# Patient Record
Sex: Male | Born: 1935 | Race: White | Hispanic: No | Marital: Single | Smoking: Former smoker
Health system: Southern US, Community
[De-identification: ages and names within clinical notes are randomized; demographics above are authoritative.]

## PROBLEM LIST (undated history)

## (undated) DIAGNOSIS — N183 Chronic kidney disease, stage 3 unspecified: Secondary | ICD-10-CM

## (undated) DIAGNOSIS — I4891 Unspecified atrial fibrillation: Secondary | ICD-10-CM

## (undated) DIAGNOSIS — H3530 Unspecified macular degeneration: Secondary | ICD-10-CM

## (undated) DIAGNOSIS — E039 Hypothyroidism, unspecified: Secondary | ICD-10-CM

## (undated) DIAGNOSIS — I2510 Atherosclerotic heart disease of native coronary artery without angina pectoris: Secondary | ICD-10-CM

## (undated) DIAGNOSIS — Z9889 Other specified postprocedural states: Secondary | ICD-10-CM

## (undated) DIAGNOSIS — I272 Pulmonary hypertension, unspecified: Secondary | ICD-10-CM

## (undated) DIAGNOSIS — I10 Essential (primary) hypertension: Secondary | ICD-10-CM

## (undated) DIAGNOSIS — I499 Cardiac arrhythmia, unspecified: Secondary | ICD-10-CM

## (undated) DIAGNOSIS — E119 Type 2 diabetes mellitus without complications: Secondary | ICD-10-CM

## (undated) HISTORY — PX: REPLACEMENT TOTAL HIP W/  RESURFACING IMPLANTS: SUR1222

## (undated) HISTORY — PX: COLONOSCOPY: SHX174

## (undated) HISTORY — DX: Atherosclerotic heart disease of native coronary artery without angina pectoris: I25.10

## (undated) HISTORY — PX: CHOLECYSTECTOMY: SHX55

---

## 2007-08-16 DIAGNOSIS — R110 Nausea: Secondary | ICD-10-CM | POA: Insufficient documentation

## 2007-08-20 DIAGNOSIS — I5021 Acute systolic (congestive) heart failure: Secondary | ICD-10-CM | POA: Insufficient documentation

## 2007-08-20 DIAGNOSIS — J189 Pneumonia, unspecified organism: Secondary | ICD-10-CM | POA: Insufficient documentation

## 2007-08-20 DIAGNOSIS — R000 Tachycardia, unspecified: Secondary | ICD-10-CM | POA: Insufficient documentation

## 2007-09-15 DIAGNOSIS — B0233 Zoster keratitis: Secondary | ICD-10-CM | POA: Insufficient documentation

## 2008-02-09 DIAGNOSIS — Z9189 Other specified personal risk factors, not elsewhere classified: Secondary | ICD-10-CM | POA: Insufficient documentation

## 2008-02-09 DIAGNOSIS — S335XXA Sprain of ligaments of lumbar spine, initial encounter: Secondary | ICD-10-CM | POA: Insufficient documentation

## 2008-10-11 ENCOUNTER — Emergency Department (HOSPITAL_COMMUNITY): Admission: EM | Admit: 2008-10-11 | Discharge: 2008-10-11 | Payer: Self-pay | Admitting: Emergency Medicine

## 2011-07-10 ENCOUNTER — Emergency Department (HOSPITAL_COMMUNITY): Payer: Medicare Other

## 2011-07-10 ENCOUNTER — Other Ambulatory Visit: Payer: Self-pay

## 2011-07-10 ENCOUNTER — Emergency Department (HOSPITAL_COMMUNITY)
Admission: EM | Admit: 2011-07-10 | Discharge: 2011-07-10 | Disposition: A | Discharge status: Home / Self Care | Payer: Medicare Other | Attending: Emergency Medicine | Admitting: Emergency Medicine

## 2011-07-10 ENCOUNTER — Encounter (HOSPITAL_COMMUNITY): Payer: Self-pay | Admitting: *Deleted

## 2011-07-10 DIAGNOSIS — R000 Tachycardia, unspecified: Secondary | ICD-10-CM | POA: Insufficient documentation

## 2011-07-10 DIAGNOSIS — E119 Type 2 diabetes mellitus without complications: Secondary | ICD-10-CM | POA: Insufficient documentation

## 2011-07-10 DIAGNOSIS — R002 Palpitations: Secondary | ICD-10-CM | POA: Insufficient documentation

## 2011-07-10 DIAGNOSIS — I4891 Unspecified atrial fibrillation: Secondary | ICD-10-CM | POA: Insufficient documentation

## 2011-07-10 DIAGNOSIS — Z79899 Other long term (current) drug therapy: Secondary | ICD-10-CM | POA: Insufficient documentation

## 2011-07-10 DIAGNOSIS — I10 Essential (primary) hypertension: Secondary | ICD-10-CM | POA: Insufficient documentation

## 2011-07-10 HISTORY — DX: Unspecified atrial fibrillation: I48.91

## 2011-07-10 LAB — POCT I-STAT, CHEM 8
Chloride: 105 mEq/L (ref 96–112)
Creatinine, Ser: 1.4 mg/dL — ABNORMAL HIGH (ref 0.50–1.35)
Glucose, Bld: 109 mg/dL — ABNORMAL HIGH (ref 70–99)
Potassium: 4 mEq/L (ref 3.5–5.1)

## 2011-07-10 MED ORDER — DILTIAZEM HCL 25 MG/5ML IV SOLN
10.0000 mg | Freq: Once | INTRAVENOUS | Status: AC
  Administered 2011-07-10: 10 mg via INTRAVENOUS

## 2011-07-10 MED ORDER — METOPROLOL SUCCINATE ER 25 MG PO TB24: 25.0000 mg | ORAL_TABLET | Freq: Every day | ORAL | Status: DC

## 2011-07-10 MED ORDER — DILTIAZEM HCL 25 MG/5ML IV SOLN
10.0000 mg | Freq: Once | INTRAVENOUS | Status: AC
  Administered 2011-07-10: 10 mg via INTRAVENOUS
  Filled 2011-07-10: qty 5

## 2011-07-10 NOTE — ED Notes (Signed)
Pt has hx of afib and was cardioverted last week into nsr and yesterday felt his heart rate become fast again.  No pain and is on amiodarone

## 2011-07-10 NOTE — ED Provider Notes (Signed)
History     CSN: [id number]  Arrival date & time 07/10/11  1626   First MD Initiated Contact with Patient 07/10/11 1727      Chief Complaint  Patient presents with  . Irregular Heart Beat    (Consider location/radiation/quality/duration/timing/severity/associated sxs/prior treatment) HPI Comments: Pt has a hx of atrial fib, had cardioversion last week.  Today, noticed that heart was beating faster.  No symptoms associated with that..  NO CP or SOB.  No dizziness or syncope.  Just wanted it checked out.  Sees a cardiologist in [place].  Symptoms unchanged for the last several hours.  The history is provided by the patient.    Past Medical History  Diagnosis Date  . Diabetes mellitus   . Hypertension   . Atrial fibrillation     Past Surgical History  Procedure Date  . Replacement total hip w/  resurfacing implants     No family history on file.  History  Substance Use Topics  . Smoking status: Former Games developer  . Smokeless tobacco: Not on file  . Alcohol Use: Yes     occ      Review of Systems  Constitutional: Negative for fever, chills, diaphoresis and fatigue.  HENT: Negative for congestion, rhinorrhea and sneezing.   Eyes: Negative.   Respiratory: Negative for cough, chest tightness and shortness of breath.   Cardiovascular: Positive for palpitations. Negative for chest pain and leg swelling.  Gastrointestinal: Negative for nausea, vomiting, abdominal pain, diarrhea and blood in stool.  Genitourinary: Negative for frequency, hematuria, flank pain and difficulty urinating.  Musculoskeletal: Negative for back pain and arthralgias.  Skin: Negative for rash.  Neurological: Negative for dizziness, speech difficulty, weakness, numbness and headaches.    Allergies  Review of patient's allergies indicates no known allergies.  Home Medications   Current Outpatient Rx  Name Route Sig Dispense Refill  . AMIODARONE HCL 100 MG PO TABS Oral Take 100 mg by mouth daily.     . ASPIRIN 81 MG PO CHEW Oral Chew 81 mg by mouth every other day.    . B COMPLEX-C PO TABS Oral Take 1 tablet by mouth daily.    Marland Kitchen DIPHENHYDRAMINE HCL 25 MG PO TABS Oral Take 25 mg by mouth every 6 (six) hours as needed. For sleep    . DOXAZOSIN MESYLATE 2 MG PO TABS Oral Take 2 mg by mouth at bedtime.    Marland Kitchen LEVOTHYROXINE SODIUM 50 MCG PO TABS Oral Take 50 mcg by mouth daily.    Marland Kitchen LISINOPRIL-HYDROCHLOROTHIAZIDE 20-25 MG PO TABS Oral Take 1 tablet by mouth daily.    Marland Kitchen METFORMIN HCL 500 MG PO TABS Oral Take 1,000 mg by mouth at bedtime.    Marland Kitchen METOPROLOL SUCCINATE ER 25 MG PO TB24 Oral Take 12.5 mg by mouth daily.    Marland Kitchen PRAVASTATIN SODIUM 40 MG PO TABS Oral Take 80 mg by mouth daily.    . WARFARIN SODIUM 3 MG PO TABS Oral Take 3 mg by mouth daily.      BP 125/82  Pulse 126  Temp(Src) 98.3 F (36.8 C) (Oral)  Resp 18  SpO2 95%  Physical Exam  Constitutional: He is oriented to person, place, and time. He appears well-developed and well-nourished.  HENT:  Head: Normocephalic and atraumatic.  Eyes: Pupils are equal, round, and reactive to light.  Neck: Normal range of motion. Neck supple.  Cardiovascular: Normal heart sounds.  Tachycardia present.   Pulmonary/Chest: Effort normal and breath sounds normal. No respiratory  distress. He has no wheezes. He has no rales. He exhibits no tenderness.  Abdominal: Soft. Bowel sounds are normal. There is no tenderness. There is no rebound and no guarding.  Musculoskeletal: Normal range of motion. He exhibits no edema.  Lymphadenopathy:    He has no cervical adenopathy.  Neurological: He is alert and oriented to person, place, and time.  Skin: Skin is warm and dry. No rash noted.  Psychiatric: He has a normal mood and affect.    ED Course  Procedures (including critical care time)  Results for orders placed during the hospital encounter of 07/10/11  POCT I-STAT, CHEM 8      Component Value Range   Sodium 142  135 - 145 (mEq/L)   Potassium 4.0   3.5 - 5.1 (mEq/L)   Chloride 105  96 - 112 (mEq/L)   BUN 23  6 - 23 (mg/dL)   Creatinine, Ser 1.61 (*) 0.50 - 1.35 (mg/dL)   Glucose, Bld 096 (*) 70 - 99 (mg/dL)   Calcium, Ion 0.45  1.12 - 1.32 (mmol/L)   TCO2 26  0 - 100 (mmol/L)   Hemoglobin 13.3  13.0 - 17.0 (g/dL)   HCT 40.9  81.1 - 91.4 (%)  POCT I-STAT TROPONIN I      Component Value Range   Troponin i, poc 0.00  0.00 - 0.08 (ng/mL)   Comment 3            Dg Chest 2 View  07/10/2011  *RADIOLOGY REPORT*  Clinical Data: 78 year old male with tachycardia.  CHEST - 2 VIEW  Comparison: 03/23/2009 and 02/05/2011  Findings: The cardiomediastinal silhouette is unremarkable. There is no evidence of focal airspace disease, pulmonary edema, suspicious pulmonary nodule/mass, pleural effusion, or pneumothorax. No acute bony abnormalities are identified. A calcified granuloma in the mid left lung is again noted.  IMPRESSION: No evidence of active cardiopulmonary disease.  Original Report Authenticated By: Rosendo Gros, M.D.    Date: 07/10/2011  Rate: 132  Rhythm: atrial fibrillation  QRS Axis: normal, rightward axia  Intervals: normal  ST/T Wave abnormalities: nonspecific T wave changes  Conduction Disutrbances:none  Narrative Interpretation:   Old EKG Reviewed: none available      1. Atrial fibrillation with rapid ventricular response       MDM  Pt given cardizem for rate control.  Has no symptoms.  Troponin neg. Spoke with Dr. Louanne Belton who is pt's on call who agrees that pt can go home.  Recommends increasing his metoprolol to 25mg  per day.  HR has come down to low 100s        Rolan Bucco, MD 07/10/11 2133

## 2011-07-10 NOTE — ED Notes (Signed)
Discharge inst given voiced understanding  Instructed to increase med to 1 pill each day.

## 2011-07-10 NOTE — Discharge Instructions (Signed)
Atrial Fibrillation Your caregiver has diagnosed you with atrial fibrillation (AFib). The heart normally beats very regularly; AFib is a type of irregular heartbeat. The heart rate may be faster or slower than normal. This can prevent your heart from pumping as well as it should. AFib can be constant (chronic) or intermittent (paroxysmal). CAUSES  Atrial fibrillation may be caused by:  Heart disease, including heart attack, coronary artery disease, heart failure, diseases of the heart valves, and others.   Blood clot in the lungs (pulmonary embolism).   Pneumonia or other infections.   Chronic lung disease.   Thyroid disease.   Toxins. These include alcohol, some medications (such as decongestant medications or diet pills), and caffeine.  In some people, no cause for AFib can be found. This is referred to as Lone Atrial Fibrillation. SYMPTOMS   Palpitations or a fluttering in your chest.   A vague sense of chest discomfort.   Shortness of breath.   Sudden onset of lightheadedness or weakness.  Sometimes, the first sign of AFib can be a complication of the condition. This could be a stroke or heart failure. DIAGNOSIS  Your description of your condition may make your caregiver suspicious of atrial fibrillation. Your caregiver will examine your pulse to determine if fibrillation is present. An EKG (electrocardiogram) will confirm the diagnosis. Further testing may help determine what caused you to have atrial fibrillation. This may include chest x-ray, echocardiogram, blood tests, or CT scans. PREVENTION  If you have previously had atrial fibrillation, your caregiver may advise you to avoid substances known to cause the condition (such as stimulant medications, and possibly caffeine or alcohol). You may be advised to use medications to prevent recurrence. Proper treatment of any underlying condition is important to help prevent recurrence. PROGNOSIS  Atrial fibrillation does tend to  become a chronic condition over time. It can cause significant complications (see below). Atrial fibrillation is not usually immediately life-threatening, but it can shorten your life expectancy. This seems to be worse in women. If you have lone atrial fibrillation and are under 60 years old, the risk of complications is very low, and life expectancy is not shortened. RISKS AND COMPLICATIONS  Complications of atrial fibrillation can include stroke, chest pain, and heart failure. Your caregiver will recommend treatments for the atrial fibrillation, as well as for any underlying conditions, to help minimize risk of complications. TREATMENT  Treatment for AFib is divided into several categories:  Treatment of any underlying condition.   Converting you out of AFib into a regular (sinus) rhythm.   Controlling rapid heart rate.   Prevention of blood clots and stroke.  Medications and procedures are available to convert your atrial fibrillation to sinus rhythm. However, recent studies have shown that this may not offer you any advantage, and cardiac experts are continuing research and debate on this topic. More important is controlling your rapid heartbeat. The rapid heartbeat causes more symptoms, and places strain on your heart. Your caregiver will advise you on the use of medications that can control your heart rate. Atrial fibrillation is a strong stroke risk. You can lessen this risk by taking blood thinning medications such as Coumadin (warfarin), or sometimes aspirin. These medications need close monitoring by your caregiver. Over-medication can cause bleeding. Too little medication may not protect against stroke. HOME CARE INSTRUCTIONS   If your caregiver prescribed medicine to make your heartbeat more normally, take as directed.   If blood thinners were prescribed by your caregiver, take EXACTLY as directed.     Perform blood tests EXACTLY as directed.   Quit smoking. Smoking increases your  cardiac and lung (pulmonary) risks.   DO NOT drink alcohol.   DO NOT drink caffeinated drinks (e.g. coffee, soda, chocolate, and leaf teas). You may drink decaffeinated coffee, soda or tea.   If you are overweight, you should choose a reduced calorie diet to lose weight. Please see a registered dietitian if you need more information about healthy weight loss. DO NOT USE DIET PILLS as they may aggravate heart problems.   If you have other heart problems that are causing AFib, you may need to eat a low salt, fat, and cholesterol diet. Your caregiver will tell you if this is necessary.   Exercise every day to improve your physical fitness. Stay active unless advised otherwise.   If your caregiver has given you a follow-up appointment, it is very important to keep that appointment. Not keeping the appointment could result in heart failure or stroke. If there is any problem keeping the appointment, you must call back to this facility for assistance.  SEEK MEDICAL CARE IF:  You notice a change in the rate, rhythm or strength of your heartbeat.   You develop an infection or any other change in your overall health status.  SEEK IMMEDIATE MEDICAL CARE IF:   You develop chest pain, abdominal pain, sweating, weakness or feel sick to your stomach (nausea).   You develop shortness of breath.   You develop swollen feet and ankles.   You develop dizziness, numbness, or weakness of your face or limbs, or any change in vision or speech.  MAKE SURE YOU:   Understand these instructions.   Will watch your condition.   Will get help right away if you are not doing well or get worse.  Document Released: 04/01/2005 Document Revised: 03/21/2011 Document Reviewed: 11/04/2007 [employer] Patient Information 2012 [employer].Atrial Fibrillation Your caregiver has diagnosed you with atrial fibrillation (AFib). The heart normally beats very regularly; AFib is a type of irregular heartbeat. The heart rate may  be faster or slower than normal. This can prevent your heart from pumping as well as it should. AFib can be constant (chronic) or intermittent (paroxysmal). CAUSES  Atrial fibrillation may be caused by:  Heart disease, including heart attack, coronary artery disease, heart failure, diseases of the heart valves, and others.   Blood clot in the lungs (pulmonary embolism).   Pneumonia or other infections.   Chronic lung disease.   Thyroid disease.   Toxins. These include alcohol, some medications (such as decongestant medications or diet pills), and caffeine.  In some people, no cause for AFib can be found. This is referred to as Lone Atrial Fibrillation. SYMPTOMS   Palpitations or a fluttering in your chest.   A vague sense of chest discomfort.   Shortness of breath.   Sudden onset of lightheadedness or weakness.  Sometimes, the first sign of AFib can be a complication of the condition. This could be a stroke or heart failure. DIAGNOSIS  Your description of your condition may make your caregiver suspicious of atrial fibrillation. Your caregiver will examine your pulse to determine if fibrillation is present. An EKG (electrocardiogram) will confirm the diagnosis. Further testing may help determine what caused you to have atrial fibrillation. This may include chest x-ray, echocardiogram, blood tests, or CT scans. PREVENTION  If you have previously had atrial fibrillation, your caregiver may advise you to avoid substances known to cause the condition (such as stimulant medications, and possibly   caffeine or alcohol). You may be advised to use medications to prevent recurrence. Proper treatment of any underlying condition is important to help prevent recurrence. PROGNOSIS  Atrial fibrillation does tend to become a chronic condition over time. It can cause significant complications (see below). Atrial fibrillation is not usually immediately life-threatening, but it can shorten your life  expectancy. This seems to be worse in women. If you have lone atrial fibrillation and are under 60 years old, the risk of complications is very low, and life expectancy is not shortened. RISKS AND COMPLICATIONS  Complications of atrial fibrillation can include stroke, chest pain, and heart failure. Your caregiver will recommend treatments for the atrial fibrillation, as well as for any underlying conditions, to help minimize risk of complications. TREATMENT  Treatment for AFib is divided into several categories:  Treatment of any underlying condition.   Converting you out of AFib into a regular (sinus) rhythm.   Controlling rapid heart rate.   Prevention of blood clots and stroke.  Medications and procedures are available to convert your atrial fibrillation to sinus rhythm. However, recent studies have shown that this may not offer you any advantage, and cardiac experts are continuing research and debate on this topic. More important is controlling your rapid heartbeat. The rapid heartbeat causes more symptoms, and places strain on your heart. Your caregiver will advise you on the use of medications that can control your heart rate. Atrial fibrillation is a strong stroke risk. You can lessen this risk by taking blood thinning medications such as Coumadin (warfarin), or sometimes aspirin. These medications need close monitoring by your caregiver. Over-medication can cause bleeding. Too little medication may not protect against stroke. HOME CARE INSTRUCTIONS   If your caregiver prescribed medicine to make your heartbeat more normally, take as directed.   If blood thinners were prescribed by your caregiver, take EXACTLY as directed.   Perform blood tests EXACTLY as directed.   Quit smoking. Smoking increases your cardiac and lung (pulmonary) risks.   DO NOT drink alcohol.   DO NOT drink caffeinated drinks (e.g. coffee, soda, chocolate, and leaf teas). You may drink decaffeinated coffee, soda  or tea.   If you are overweight, you should choose a reduced calorie diet to lose weight. Please see a registered dietitian if you need more information about healthy weight loss. DO NOT USE DIET PILLS as they may aggravate heart problems.   If you have other heart problems that are causing AFib, you may need to eat a low salt, fat, and cholesterol diet. Your caregiver will tell you if this is necessary.   Exercise every day to improve your physical fitness. Stay active unless advised otherwise.   If your caregiver has given you a follow-up appointment, it is very important to keep that appointment. Not keeping the appointment could result in heart failure or stroke. If there is any problem keeping the appointment, you must call back to this facility for assistance.  SEEK MEDICAL CARE IF:  You notice a change in the rate, rhythm or strength of your heartbeat.   You develop an infection or any other change in your overall health status.  SEEK IMMEDIATE MEDICAL CARE IF:   You develop chest pain, abdominal pain, sweating, weakness or feel sick to your stomach (nausea).   You develop shortness of breath.   You develop swollen feet and ankles.   You develop dizziness, numbness, or weakness of your face or limbs, or any change in vision or speech.    MAKE SURE YOU:   Understand these instructions.   Will watch your condition.   Will get help right away if you are not doing well or get worse.  Document Released: 04/01/2005 Document Revised: 03/21/2011 Document Reviewed: 11/04/2007 [employer] Patient Information 2012 [employer]. 

## 2013-12-01 ENCOUNTER — Inpatient Hospital Stay (HOSPITAL_COMMUNITY)
Admission: EM | Admit: 2013-12-01 | Discharge: 2013-12-11 | DRG: 438 | Disposition: A | Discharge status: Home Health | Payer: Medicare Other | Attending: Internal Medicine | Admitting: Internal Medicine

## 2013-12-01 ENCOUNTER — Emergency Department (HOSPITAL_COMMUNITY): Payer: Medicare Other

## 2013-12-01 ENCOUNTER — Encounter (HOSPITAL_COMMUNITY): Payer: Self-pay | Admitting: Internal Medicine

## 2013-12-01 ENCOUNTER — Observation Stay (HOSPITAL_COMMUNITY): Payer: Medicare Other

## 2013-12-01 DIAGNOSIS — R062 Wheezing: Secondary | ICD-10-CM

## 2013-12-01 DIAGNOSIS — Z79899 Other long term (current) drug therapy: Secondary | ICD-10-CM | POA: Diagnosis not present

## 2013-12-01 DIAGNOSIS — K8050 Calculus of bile duct without cholangitis or cholecystitis without obstruction: Secondary | ICD-10-CM | POA: Diagnosis present

## 2013-12-01 DIAGNOSIS — R799 Abnormal finding of blood chemistry, unspecified: Secondary | ICD-10-CM

## 2013-12-01 DIAGNOSIS — I5031 Acute diastolic (congestive) heart failure: Secondary | ICD-10-CM

## 2013-12-01 DIAGNOSIS — N183 Chronic kidney disease, stage 3 unspecified: Secondary | ICD-10-CM | POA: Diagnosis present

## 2013-12-01 DIAGNOSIS — Z6829 Body mass index (BMI) 29.0-29.9, adult: Secondary | ICD-10-CM

## 2013-12-01 DIAGNOSIS — K859 Acute pancreatitis without necrosis or infection, unspecified: Secondary | ICD-10-CM | POA: Diagnosis not present

## 2013-12-01 DIAGNOSIS — K5900 Constipation, unspecified: Secondary | ICD-10-CM | POA: Diagnosis present

## 2013-12-01 DIAGNOSIS — Z87891 Personal history of nicotine dependence: Secondary | ICD-10-CM

## 2013-12-01 DIAGNOSIS — I5030 Unspecified diastolic (congestive) heart failure: Secondary | ICD-10-CM | POA: Diagnosis present

## 2013-12-01 DIAGNOSIS — I10 Essential (primary) hypertension: Secondary | ICD-10-CM | POA: Diagnosis present

## 2013-12-01 DIAGNOSIS — J440 Chronic obstructive pulmonary disease with acute lower respiratory infection: Secondary | ICD-10-CM | POA: Diagnosis present

## 2013-12-01 DIAGNOSIS — Z7982 Long term (current) use of aspirin: Secondary | ICD-10-CM | POA: Diagnosis not present

## 2013-12-01 DIAGNOSIS — I129 Hypertensive chronic kidney disease with stage 1 through stage 4 chronic kidney disease, or unspecified chronic kidney disease: Secondary | ICD-10-CM | POA: Diagnosis present

## 2013-12-01 DIAGNOSIS — K831 Obstruction of bile duct: Secondary | ICD-10-CM | POA: Diagnosis present

## 2013-12-01 DIAGNOSIS — D65 Disseminated intravascular coagulation [defibrination syndrome]: Secondary | ICD-10-CM | POA: Diagnosis present

## 2013-12-01 DIAGNOSIS — I4891 Unspecified atrial fibrillation: Secondary | ICD-10-CM | POA: Diagnosis present

## 2013-12-01 DIAGNOSIS — Z7901 Long term (current) use of anticoagulants: Secondary | ICD-10-CM | POA: Diagnosis not present

## 2013-12-01 DIAGNOSIS — E038 Other specified hypothyroidism: Secondary | ICD-10-CM

## 2013-12-01 DIAGNOSIS — R7401 Elevation of levels of liver transaminase levels: Secondary | ICD-10-CM | POA: Diagnosis present

## 2013-12-01 DIAGNOSIS — E039 Hypothyroidism, unspecified: Secondary | ICD-10-CM | POA: Diagnosis present

## 2013-12-01 DIAGNOSIS — I2789 Other specified pulmonary heart diseases: Secondary | ICD-10-CM | POA: Diagnosis present

## 2013-12-01 DIAGNOSIS — E1121 Type 2 diabetes mellitus with diabetic nephropathy: Secondary | ICD-10-CM | POA: Diagnosis present

## 2013-12-01 DIAGNOSIS — E1129 Type 2 diabetes mellitus with other diabetic kidney complication: Secondary | ICD-10-CM | POA: Diagnosis present

## 2013-12-01 DIAGNOSIS — I272 Pulmonary hypertension, unspecified: Secondary | ICD-10-CM | POA: Diagnosis present

## 2013-12-01 DIAGNOSIS — N058 Unspecified nephritic syndrome with other morphologic changes: Secondary | ICD-10-CM | POA: Diagnosis present

## 2013-12-01 DIAGNOSIS — K8071 Calculus of gallbladder and bile duct without cholecystitis with obstruction: Secondary | ICD-10-CM | POA: Diagnosis present

## 2013-12-01 DIAGNOSIS — E785 Hyperlipidemia, unspecified: Secondary | ICD-10-CM | POA: Diagnosis present

## 2013-12-01 DIAGNOSIS — T45515A Adverse effect of anticoagulants, initial encounter: Secondary | ICD-10-CM | POA: Diagnosis present

## 2013-12-01 DIAGNOSIS — K8020 Calculus of gallbladder without cholecystitis without obstruction: Secondary | ICD-10-CM | POA: Diagnosis present

## 2013-12-01 DIAGNOSIS — K851 Biliary acute pancreatitis without necrosis or infection: Secondary | ICD-10-CM

## 2013-12-01 DIAGNOSIS — R109 Unspecified abdominal pain: Secondary | ICD-10-CM | POA: Diagnosis present

## 2013-12-01 DIAGNOSIS — I509 Heart failure, unspecified: Secondary | ICD-10-CM | POA: Diagnosis present

## 2013-12-01 DIAGNOSIS — D696 Thrombocytopenia, unspecified: Secondary | ICD-10-CM | POA: Diagnosis not present

## 2013-12-01 DIAGNOSIS — J209 Acute bronchitis, unspecified: Secondary | ICD-10-CM | POA: Diagnosis present

## 2013-12-01 DIAGNOSIS — R740 Nonspecific elevation of levels of transaminase and lactic acid dehydrogenase [LDH]: Secondary | ICD-10-CM

## 2013-12-01 HISTORY — DX: Chronic kidney disease, stage 3 (moderate): N18.3

## 2013-12-01 HISTORY — DX: Hypothyroidism, unspecified: E03.9

## 2013-12-01 HISTORY — DX: Chronic kidney disease, stage 3 unspecified: N18.30

## 2013-12-01 HISTORY — DX: Pulmonary hypertension, unspecified: I27.20

## 2013-12-01 HISTORY — DX: Other specified postprocedural states: Z98.890

## 2013-12-01 HISTORY — DX: Type 2 diabetes mellitus without complications: E11.9

## 2013-12-01 HISTORY — DX: Essential (primary) hypertension: I10

## 2013-12-01 LAB — GLUCOSE, CAPILLARY
GLUCOSE-CAPILLARY: 165 mg/dL — AB (ref 70–99)
Glucose-Capillary: 123 mg/dL — ABNORMAL HIGH (ref 70–99)

## 2013-12-01 LAB — CBC
HCT: 38.4 % — ABNORMAL LOW (ref 39.0–52.0)
Hemoglobin: 13.4 g/dL (ref 13.0–17.0)
MCH: 31.2 pg (ref 26.0–34.0)
MCHC: 34.9 g/dL (ref 30.0–36.0)
MCV: 89.3 fL (ref 78.0–100.0)
PLATELETS: 153 10*3/uL (ref 150–400)
RBC: 4.3 MIL/uL (ref 4.22–5.81)
RDW: 12.9 % (ref 11.5–15.5)
WBC: 12.9 10*3/uL — ABNORMAL HIGH (ref 4.0–10.5)

## 2013-12-01 LAB — URINALYSIS, ROUTINE W REFLEX MICROSCOPIC
Bilirubin Urine: NEGATIVE
Glucose, UA: NEGATIVE mg/dL
Ketones, ur: NEGATIVE mg/dL
Leukocytes, UA: NEGATIVE
Nitrite: NEGATIVE
PH: 5 (ref 5.0–8.0)
Protein, ur: NEGATIVE mg/dL
SPECIFIC GRAVITY, URINE: 1.01 (ref 1.005–1.030)
UROBILINOGEN UA: 0.2 mg/dL (ref 0.0–1.0)

## 2013-12-01 LAB — CBC WITH DIFFERENTIAL/PLATELET
BASOS ABS: 0 10*3/uL (ref 0.0–0.1)
BASOS PCT: 0 % (ref 0–1)
EOS ABS: 0.1 10*3/uL (ref 0.0–0.7)
Eosinophils Relative: 1 % (ref 0–5)
HCT: 41.7 % (ref 39.0–52.0)
HEMOGLOBIN: 14.5 g/dL (ref 13.0–17.0)
Lymphocytes Relative: 18 % (ref 12–46)
Lymphs Abs: 2.5 10*3/uL (ref 0.7–4.0)
MCH: 30.7 pg (ref 26.0–34.0)
MCHC: 34.8 g/dL (ref 30.0–36.0)
MCV: 88.3 fL (ref 78.0–100.0)
MONOS PCT: 7 % (ref 3–12)
Monocytes Absolute: 0.9 10*3/uL (ref 0.1–1.0)
NEUTROS ABS: 10.3 10*3/uL — AB (ref 1.7–7.7)
NEUTROS PCT: 74 % (ref 43–77)
Platelets: 204 10*3/uL (ref 150–400)
RBC: 4.72 MIL/uL (ref 4.22–5.81)
RDW: 12.5 % (ref 11.5–15.5)
WBC: 13.8 10*3/uL — ABNORMAL HIGH (ref 4.0–10.5)

## 2013-12-01 LAB — COMPREHENSIVE METABOLIC PANEL
ALBUMIN: 4.3 g/dL (ref 3.5–5.2)
ALBUMIN: 3.7 g/dL (ref 3.5–5.2)
ALK PHOS: 101 U/L (ref 39–117)
ALT: 45 U/L (ref 0–53)
ALT: 365 U/L — AB (ref 0–53)
ANION GAP: 14 (ref 5–15)
AST: 83 U/L — AB (ref 0–37)
AST: 592 U/L — ABNORMAL HIGH (ref 0–37)
Alkaline Phosphatase: 134 U/L — ABNORMAL HIGH (ref 39–117)
Anion gap: 10 (ref 5–15)
BUN: 23 mg/dL (ref 6–23)
BUN: 23 mg/dL (ref 6–23)
CALCIUM: 8.6 mg/dL (ref 8.4–10.5)
CO2: 29 mEq/L (ref 19–32)
CO2: 28 mEq/L (ref 19–32)
CREATININE: 1.51 mg/dL — AB (ref 0.50–1.35)
Calcium: 9.4 mg/dL (ref 8.4–10.5)
Chloride: 98 mEq/L (ref 96–112)
Chloride: 101 mEq/L (ref 96–112)
Creatinine, Ser: 1.36 mg/dL — ABNORMAL HIGH (ref 0.50–1.35)
GFR calc Af Amer: 49 mL/min — ABNORMAL LOW (ref >90)
GFR calc non Af Amer: 42 mL/min — ABNORMAL LOW (ref >90)
GFR calc non Af Amer: 48 mL/min — ABNORMAL LOW (ref >90)
GFR, EST AFRICAN AMERICAN: 55 mL/min — AB (ref >90)
Glucose, Bld: 203 mg/dL — ABNORMAL HIGH (ref 70–99)
Glucose, Bld: 168 mg/dL — ABNORMAL HIGH (ref 70–99)
POTASSIUM: 3.6 meq/L — AB (ref 3.7–5.3)
Potassium: 3.8 mEq/L (ref 3.7–5.3)
Sodium: 141 mEq/L (ref 137–147)
Sodium: 139 mEq/L (ref 137–147)
TOTAL PROTEIN: 7.5 g/dL (ref 6.0–8.3)
TOTAL PROTEIN: 6.4 g/dL (ref 6.0–8.3)
Total Bilirubin: 1.3 mg/dL — ABNORMAL HIGH (ref 0.3–1.2)
Total Bilirubin: 2.5 mg/dL — ABNORMAL HIGH (ref 0.3–1.2)

## 2013-12-01 LAB — LIPASE, BLOOD
LIPASE: 939 U/L — AB (ref 11–59)
LIPASE: 582 U/L — AB (ref 11–59)

## 2013-12-01 LAB — PROTIME-INR
INR: 2.61 — ABNORMAL HIGH (ref 0.00–1.49)
PROTHROMBIN TIME: 27.9 s — AB (ref 11.6–15.2)

## 2013-12-01 LAB — I-STAT CG4 LACTIC ACID, ED: LACTIC ACID, VENOUS: 2.47 mmol/L — AB (ref 0.5–2.2)

## 2013-12-01 LAB — I-STAT TROPONIN, ED: TROPONIN I, POC: 0 ng/mL (ref 0.00–0.08)

## 2013-12-01 LAB — URINE MICROSCOPIC-ADD ON

## 2013-12-01 MED ORDER — GADOBENATE DIMEGLUMINE 529 MG/ML IV SOLN
20.0000 mL | Freq: Once | INTRAVENOUS | Status: AC | PRN
  Administered 2013-12-01: 20 mL via INTRAVENOUS

## 2013-12-01 MED ORDER — MORPHINE SULFATE 2 MG/ML IJ SOLN: 2.0000 mg | INTRAMUSCULAR | Status: DC | PRN

## 2013-12-01 MED ORDER — SODIUM CHLORIDE 0.9 % IV SOLN
Freq: Once | INTRAVENOUS | Status: AC
  Administered 2013-12-01: 1000 mL via INTRAVENOUS

## 2013-12-01 MED ORDER — SODIUM CHLORIDE 0.9 % IJ SOLN
3.0000 mL | Freq: Two times a day (BID) | INTRAMUSCULAR | Status: DC
  Administered 2013-12-01 – 2013-12-10 (×17): 3 mL via INTRAVENOUS

## 2013-12-01 MED ORDER — ONDANSETRON HCL 4 MG PO TABS: 4.0000 mg | ORAL_TABLET | Freq: Four times a day (QID) | ORAL | Status: DC | PRN

## 2013-12-01 MED ORDER — ONDANSETRON HCL 4 MG/2ML IJ SOLN: 4.0000 mg | Freq: Four times a day (QID) | INTRAMUSCULAR | Status: DC | PRN

## 2013-12-01 MED ORDER — ONDANSETRON HCL 4 MG/2ML IJ SOLN
4.0000 mg | Freq: Once | INTRAMUSCULAR | Status: AC
  Administered 2013-12-01: 4 mg via INTRAVENOUS
  Filled 2013-12-01: qty 2

## 2013-12-01 MED ORDER — MORPHINE SULFATE 4 MG/ML IJ SOLN
4.0000 mg | Freq: Once | INTRAMUSCULAR | Status: AC
  Administered 2013-12-01: 4 mg via INTRAVENOUS
  Filled 2013-12-01: qty 1

## 2013-12-01 MED ORDER — LISINOPRIL 10 MG PO TABS
20.0000 mg | ORAL_TABLET | Freq: Every day | ORAL | Status: DC
  Administered 2013-12-01 – 2013-12-11 (×11): 20 mg via ORAL
  Filled 2013-12-01 (×11): qty 2

## 2013-12-01 MED ORDER — ALUM & MAG HYDROXIDE-SIMETH 200-200-20 MG/5ML PO SUSP: 30.0000 mL | Freq: Four times a day (QID) | ORAL | Status: DC | PRN

## 2013-12-01 MED ORDER — LEVOTHYROXINE SODIUM 25 MCG PO TABS
50.0000 ug | ORAL_TABLET | Freq: Every day | ORAL | Status: DC
  Administered 2013-12-01 – 2013-12-11 (×11): 50 ug via ORAL
  Filled 2013-12-01 (×2): qty 2
  Filled 2013-12-01: qty 1
  Filled 2013-12-01 (×6): qty 2
  Filled 2013-12-01 (×2): qty 1

## 2013-12-01 MED ORDER — SODIUM CHLORIDE 0.9 % IJ SOLN
INTRAMUSCULAR | Status: AC
  Filled 2013-12-01: qty 300

## 2013-12-01 MED ORDER — DILTIAZEM HCL ER COATED BEADS 240 MG PO CP24
240.0000 mg | ORAL_CAPSULE | Freq: Every day | ORAL | Status: DC
  Administered 2013-12-01 – 2013-12-04 (×4): 240 mg via ORAL
  Filled 2013-12-01 (×5): qty 1

## 2013-12-01 MED ORDER — SODIUM CHLORIDE 0.9 % IV BOLUS (SEPSIS): 500.0000 mL | Freq: Once | INTRAVENOUS | Status: DC

## 2013-12-01 MED ORDER — METOPROLOL SUCCINATE ER 25 MG PO TB24
25.0000 mg | ORAL_TABLET | Freq: Every day | ORAL | Status: DC
  Administered 2013-12-01 – 2013-12-05 (×5): 25 mg via ORAL
  Filled 2013-12-01 (×5): qty 1

## 2013-12-01 MED ORDER — LISINOPRIL-HYDROCHLOROTHIAZIDE 20-25 MG PO TABS: 1.0000 | ORAL_TABLET | Freq: Every day | ORAL | Status: DC

## 2013-12-01 MED ORDER — SODIUM CHLORIDE 0.9 % IV SOLN
INTRAVENOUS | Status: AC
  Filled 2013-12-01: qty 300

## 2013-12-01 MED ORDER — HYDROCHLOROTHIAZIDE 25 MG PO TABS
25.0000 mg | ORAL_TABLET | Freq: Every day | ORAL | Status: DC
  Administered 2013-12-01: 25 mg via ORAL
  Filled 2013-12-01: qty 1

## 2013-12-01 MED ORDER — SIMVASTATIN 20 MG PO TABS: 40.0000 mg | ORAL_TABLET | Freq: Every day | ORAL | Status: DC

## 2013-12-01 MED ORDER — METFORMIN HCL 500 MG PO TABS: 1000.0000 mg | ORAL_TABLET | Freq: Every day | ORAL | Status: DC

## 2013-12-01 MED ORDER — ACETAMINOPHEN 325 MG PO TABS
650.0000 mg | ORAL_TABLET | Freq: Four times a day (QID) | ORAL | Status: DC | PRN
  Administered 2013-12-03: 650 mg via ORAL
  Filled 2013-12-01: qty 2

## 2013-12-01 MED ORDER — ATORVASTATIN CALCIUM 20 MG PO TABS
20.0000 mg | ORAL_TABLET | Freq: Every day | ORAL | Status: DC
  Administered 2013-12-01 – 2013-12-10 (×10): 20 mg via ORAL
  Filled 2013-12-01 (×10): qty 1

## 2013-12-01 MED ORDER — PANTOPRAZOLE SODIUM 40 MG PO TBEC
40.0000 mg | DELAYED_RELEASE_TABLET | Freq: Every day | ORAL | Status: DC
  Administered 2013-12-01 – 2013-12-03 (×3): 40 mg via ORAL
  Filled 2013-12-01 (×3): qty 1

## 2013-12-01 MED ORDER — WARFARIN SODIUM 2 MG PO TABS: 3.0000 mg | ORAL_TABLET | Freq: Every day | ORAL | Status: DC

## 2013-12-01 MED ORDER — IOHEXOL 300 MG/ML  SOLN
50.0000 mL | Freq: Once | INTRAMUSCULAR | Status: AC | PRN
  Administered 2013-12-01: 50 mL via INTRAVENOUS

## 2013-12-01 MED ORDER — WARFARIN SODIUM 3 MG PO TABS: 3.0000 mg | ORAL_TABLET | Freq: Every day | ORAL | Status: DC

## 2013-12-01 MED ORDER — IOHEXOL 300 MG/ML  SOLN
80.0000 mL | Freq: Once | INTRAMUSCULAR | Status: AC | PRN
  Administered 2013-12-01: 80 mL via INTRAVENOUS

## 2013-12-01 MED ORDER — ASPIRIN 81 MG PO CHEW
81.0000 mg | CHEWABLE_TABLET | ORAL | Status: DC
  Administered 2013-12-01: 81 mg via ORAL
  Filled 2013-12-01: qty 1

## 2013-12-01 MED ORDER — ACETAMINOPHEN 650 MG RE SUPP: 650.0000 mg | Freq: Four times a day (QID) | RECTAL | Status: DC | PRN

## 2013-12-01 MED ORDER — POLYETHYLENE GLYCOL 3350 17 G PO PACK
17.0000 g | PACK | Freq: Every day | ORAL | Status: DC | PRN
Start: 2013-12-01 — End: 2013-12-11
  Administered 2013-12-01: 17 g via ORAL
  Filled 2013-12-01: qty 1

## 2013-12-01 MED ORDER — DOXAZOSIN MESYLATE 2 MG PO TABS
2.0000 mg | ORAL_TABLET | Freq: Every day | ORAL | Status: DC
  Administered 2013-12-02 – 2013-12-10 (×9): 2 mg via ORAL
  Filled 2013-12-01 (×11): qty 1

## 2013-12-01 MED ORDER — WARFARIN - PHYSICIAN DOSING INPATIENT: Freq: Every day | Status: DC

## 2013-12-01 MED ORDER — INSULIN ASPART 100 UNIT/ML ~~LOC~~ SOLN
0.0000 [IU] | SUBCUTANEOUS | Status: DC
  Administered 2013-12-01: 2 [IU] via SUBCUTANEOUS
  Administered 2013-12-01 – 2013-12-02 (×2): 1 [IU] via SUBCUTANEOUS
  Administered 2013-12-02: 3 [IU] via SUBCUTANEOUS
  Administered 2013-12-02 (×2): 1 [IU] via SUBCUTANEOUS

## 2013-12-01 MED ORDER — DOCUSATE SODIUM 100 MG PO CAPS: 100.0000 mg | ORAL_CAPSULE | Freq: Every day | ORAL | Status: DC | PRN

## 2013-12-01 MED ORDER — SODIUM CHLORIDE 0.9 % IV SOLN
INTRAVENOUS | Status: DC
  Administered 2013-12-01 – 2013-12-02 (×3): via INTRAVENOUS

## 2013-12-01 NOTE — Consult Note (Addendum)
REVIEWED. MRCP-NO CBD STONE. FULL LIQUID DIET. RE-START COUMADIN AUG 20. SURGERY CONSULT AUG 20 FOR CHOLECYSTECTOMY.

## 2013-12-01 NOTE — Progress Notes (Signed)
Inpatient Diabetes Program Recommendations  AACE/ADA: New Consensus Statement on Inpatient Glycemic Control (2013)  Target Ranges:  Prepandial:   less than 140 mg/dL      Peak postprandial:   less than 180 mg/dL (1-2 hours)      Critically ill patients:  140 - 180 mg/dL   Results for Brett [name], Brett [name] (MRN [id number]) as of 12/01/2013 11:29  Ref. Range 12/01/2013 01:08 12/01/2013 06:24  Glucose Latest Range: 70-99 mg/dL 203 (H) 168 (H)    Diabetes history: DM2 Outpatient Diabetes medications: Metformin 1000 mg QHS Current orders for Inpatient glycemic control: Metformin 1000 mg QHS  Inpatient Diabetes Program Recommendations Correction (SSI): Please consider ordering CBGs and Novolog sensitive correction scale Q4H. HgbA1C: Please consider ordering an A1C to evaluate glycemic control over the past 2-3 months.  Thanks, Barnie Alderman, RN, MSN, CCRN Diabetes Coordinator Inpatient Diabetes Program [phone] (Team Pager) [phone] ([facility] office) [phone] [facility] office)

## 2013-12-01 NOTE — Progress Notes (Signed)
The patient is a 80 year old man with a history of diabetes mellitus, atrial fibrillation on Coumadin, hypertension, and chronic kidney disease. He was admitted this morning with a chief complaint of abdominal pain and was found to have choledocholithiasis, cholelithiasis, and acute pancreatitis. His chart, vital signs, and laboratory studies were reviewed. His pain has subsided. Gastroenterology NP, Mr. Algernon Huxley has evaluated the patient and made recommendations with the attending input to follow. Agree with holding the Coumadin and aspirin and ordering, ordering PPI prophylaxis and ordering of the MRCP. ERCP may be needed. We will discontinue metformin and start sliding scale NovoLog sensitivity scale for now. We'll discontinue HCTZ. Will advance diet to clear liquids when clinically appropriate and when okay with gastroenterology. We'll order additional laboratory studies for tomorrow morning including CBC, basic metabolic panel, TSH, and hemoglobin A1c. We'll continue to follow lipase and hepatic function panel.

## 2013-12-01 NOTE — H&P (Signed)
History and Physical  Ty Buntrock HQI:[id number] DOB: 1932-10-20 DOA: 12/01/2013  Referring physician: Dr Christy Gentles PCP: [name], MD   Chief Complaint: abdominal Pain  HPI: Essie Gehret is a 80 y.o. male  For diabetes, hypertension, atrophic relation, hyperlipidemia who presents to the hospital with 12 hour history of severe left upper quadrant abdominal pain that started last night after dinner. The patient's a moderate inserted have left upper quadrant abdominal pain. He went to sit in his recliner the pain eased event. Later, when he went to bed, the pain intensified to the point where he became very diaphoretic. At approximately 11 PM he had his wife take him to the nurse [facility] for evaluation. Movement makes the pain worse. Pain medicines improves the pain.   Review of Systems:  Pt complains of pain with deep inspiration, nausea, constipation.  Pt denies any chest pain, shortness of breath, fevers, chills, palpitations, diarrhea, vomiting, melena, hematochezia.  Review of systems are otherwise negative  Past Medical History  Diagnosis Date  . Diabetes mellitus   . Hypertension   . Atrial fibrillation    Past Surgical History  Procedure Laterality Date  . Replacement total hip w/  resurfacing implants     Social History:  reports that he has quit smoking. He does not have any smokeless tobacco history on file. He reports that he drinks alcohol. He reports that he does not use illicit drugs. Patient lives at home & is able to participate in activities of daily living without assistance  No Known Allergies  No family history on file.    Prior to Admission medications   Medication Sig Start Date End Date Taking? Authorizing Provider  aspirin 81 MG chewable tablet Chew 81 mg by mouth every other day.   Yes Historical Provider, MD  diltiazem (TIAZAC) 240 MG 24 hr capsule Take 240 mg by mouth daily.   Yes Historical Provider, MD  diphenhydrAMINE (BENADRYL) 25 MG tablet Take  25 mg by mouth every 6 (six) hours as needed. For sleep   Yes Historical Provider, MD  doxazosin (CARDURA) 2 MG tablet Take 2 mg by mouth at bedtime.   Yes Historical Provider, MD  levothyroxine (SYNTHROID, LEVOTHROID) 50 MCG tablet Take 50 mcg by mouth daily.   Yes Historical Provider, MD  lisinopril-hydrochlorothiazide (PRINZIDE,ZESTORETIC) 20-25 MG per tablet Take 1 tablet by mouth daily.   Yes Historical Provider, MD  metFORMIN (GLUCOPHAGE) 500 MG tablet Take 1,000 mg by mouth at bedtime.   Yes Historical Provider, MD  metoprolol succinate (TOPROL-XL) 25 MG 24 hr tablet Take 1 tablet (25 mg total) by mouth daily. 07/10/11  Yes Malvin Johns, MD  pravastatin (PRAVACHOL) 40 MG tablet Take 80 mg by mouth daily.   Yes Historical Provider, MD  warfarin (COUMADIN) 3 MG tablet Take 3 mg by mouth daily.   Yes Historical Provider, MD  amiodarone (PACERONE) 100 MG tablet Take 100 mg by mouth daily.    Historical Provider, MD  B Complex-C (B-COMPLEX WITH VITAMIN C) tablet Take 1 tablet by mouth daily.    Historical Provider, MD    Physical Exam: BP 97/54  Pulse 87  Temp(Src) 98.9 F (37.2 C) (Oral)  Resp 26  Ht 6' (1.829 m)  Wt 99.791 kg (220 lb)  BMI 29.83 kg/m2  SpO2 98%  General:  Older Caucasian male who is awake alert and oriented x3. He is not in any acute cardiopulmonary distress. Eyes: Pupils equal, round, reactive to light. Extraocular muscles are intact. Sclerae anicteric.  ENT: External auditory canals are impacted with cerumen. There oral mucosal lesions. Oropharynx is moist.are no Neck:  supple without lymphadenopathy. No carotid bruits. No masses palpated.  Cardiovascular:  irregularly irregular rate. No murmurs, rubs, gallops. No jugular venous distention.  Respiratory:  clear auscultation bilaterally with no wheezes, rales, rhonchi. Good respiratory effort.  Abdomen:  soft, distended. Tenderness is to palpation in the left upper quadrant. No rebound tenderness no guarding. Skin:   warm to touch with 2+ dorsalis pedis and radial pulses. A couple scattered bruises.  Musculoskeletal:  no joint pain or myalgias.  Psychiatric:  appropriate insight and judgment.  Neurologic: No focal neurological deficits observed.           Labs on Admission:  Basic Metabolic Panel:  Recent Labs Lab 12/01/13 0108  NA 141  K 3.6*  CL 98  CO2 29  GLUCOSE 203*  BUN 23  CREATININE 1.51*  CALCIUM 9.4   Liver Function Tests:  Recent Labs Lab 12/01/13 0108  AST 83*  ALT 45  ALKPHOS 101  BILITOT 1.3*  PROT 7.5  ALBUMIN 4.3    Recent Labs Lab 12/01/13 0108  LIPASE 939*   No results found for this basename: AMMONIA,  in the last 168 hours CBC:  Recent Labs Lab 12/01/13 0108  WBC 13.8*  NEUTROABS 10.3*  HGB 14.5  HCT 41.7  MCV 88.3  PLT 204   Cardiac Enzymes: No results found for this basename: CKTOTAL, CKMB, CKMBINDEX, TROPONINI,  in the last 168 hours  BNP (last 3 results) No results found for this basename: PROBNP,  in the last 8760 hours CBG: No results found for this basename: GLUCAP,  in the last 168 hours  Radiological Exams on Admission: Ct Abdomen Pelvis W Contrast  12/01/2013   CLINICAL DATA:  Abdominal pain.  EXAM: CT ABDOMEN AND PELVIS WITH CONTRAST  TECHNIQUE: Multidetector CT imaging of the abdomen and pelvis was performed using the standard protocol following bolus administration of intravenous contrast.  CONTRAST:  82mL OMNIPAQUE IOHEXOL 300 MG/ML SOLN, 19mL OMNIPAQUE IOHEXOL 300 MG/ML SOLN  COMPARISON:  None.  FINDINGS: LUNG BASES: Included view of the lung bases demonstrate mild pleural thickening and dependent atelectasis. Lingular atelectasis versus scarring. Heart size is normal, pericardium is unremarkable. Coronary artery calcifications present.  SOLID ORGANS: The pancreas and adrenal glands are unremarkable. Multiple subcentimeter gallstones without CT findings of acute cholecystitis. Mild gallbladder distention. 4 mm dense presumed stone  within the mid to distal Common bile duct, coronal 56/119. 17 mm cyst in central right lobe of the liver, the liver is otherwise unremarkable. Spleen, adrenal glands are normal in appearance.  GASTROINTESTINAL TRACT: The stomach, small and large bowel are normal in course and caliber without inflammatory changes. Sigmoid diverticulosis. Enteric contrast has not yet reached the distal small bowel. The appendix is not discretely identified, however there are no inflammatory changes in the right lower quadrant.  KIDNEYS/ URINARY TRACT: Kidneys are orthotopic, demonstrating symmetric enhancement. 4 mm right interpolar nephrolithiasis. No hydronephrosis or solid renal masses. 2.7 cm left interpolar cyst. Nonspecific bilateral perinephric stranding. The unopacified ureters are normal in course and caliber. Delayed imaging through the kidneys demonstrates symmetric prompt contrast excretion within the proximal urinary collecting system. Urinary bladder is grossly unremarkable though predominately obscured by streak artifact from hip arthroplasties.  PERITONEUM/RETROPERITONEUM: No intraperitoneal free fluid nor free air. Aortoiliac vessels are normal in course and caliber, moderate calcific atherosclerosis. No lymphadenopathy by CT size criteria. Internal reproductive organs are unremarkable.  SOFT TISSUE/OSSEOUS STRUCTURES: Status post bilateral hip arthroplasties which results in streak artifact limiting assessment of the pelvic contents. Small fat containing umbilical hernia.  IMPRESSION: Cholelithiasis, gallbladder distention with 4 mm mid to distal Common bile duct stone. No CT findings of acute cholecystitis.  4 mm nonobstructing right nephrolithiasis.   Electronically Signed   By: Elon Alas   On: 12/01/2013 04:07   Dg Chest Port 1 View  12/01/2013   CLINICAL DATA:  Chest pain  EXAM: PORTABLE CHEST - 1 VIEW  COMPARISON:  07/10/2011  FINDINGS: Mild cardiomegaly, similar to previous given differences in  technique. There are low lung volumes with interstitial crowding. There is a right infrahilar opacity which is small and indistinct. Abdominal CT has been ordered, and will likely encompass this area. Calcified granuloma in the left lung. No effusion, edema, or pneumothorax.  IMPRESSION: Indistinct opacity at the medial right base could reflect atelectasis or infectious infiltrate. This area may be encompassed on abdominal CT currently pending. If not, recommend chest x-ray after convalescence.   Electronically Signed   By: Jorje Guild M.D.   On: 12/01/2013 02:20    Assessment/Plan Present on Admission:  . Choledocholithiasis . Pancreatitis due to biliary obstruction  #1 choledocholithiasis - presumed Admit for observation Consult GI for possibility of MRCP or ERCP. Keep n.p.o.  #2 Pancreatitis due to biliary obstruction Keep n.p.o. IV fluids at 200 mils per hour Pain control Recheck lipase as pain markedly reduced and the stone may have moved, resolving the acute peritonitis.  Consultants: GI  Code Status: Full  Family Communication: None   Disposition Plan: Pending consult  Time spent: 50 minutes  [place], [place] [facility] Pager [phone]  **Disclaimer: This note may have been dictated with voice recognition software. Similar sounding words can inadvertently be transcribed and this note may contain transcription errors which may not have been corrected upon publication of note.**

## 2013-12-01 NOTE — Consult Note (Signed)
Referring Provider: Dr. Emelia Loron Primary Care Physician:  [name], MD Primary Gastroenterologist:  Dr. Oneida Alar  Date of Admission: 12/01/13 Date of Consultation: 12/01/13  Reason for Consultation:  Choledocholithiasis  HPI:  Brett [name] is a 80 year old male presenting with acute abdominal pain and found to have cholelithiasis and 101mm mid to distal presumed CBD stone on CT. No findings of acute cholecystitis. Lipase elevated at 939 on admission now in 580 range.  Tbili 1.3, now 2.5. Bump in transaminases noted today.   Pain started yesterday evening around 6pm after dinner. Pain in epigastric region/upper abdomen, radiating up into chest, diaphoretic.Tried to vomit but unable, wondering if it would make him feel better. No fever or chills. Pain significantly improved since admission; received Morphine around 3am per patient and no significant pain since then.   Last colonoscopy at least 10 years ago. Chronic constipation. Occasional low-volume hematochezia. Feels constipated now. On Coumadin for afib. INR 2.61. Last dose of Coumadin yesterday evening, 8/18.   Past Medical History  Diagnosis Date  . Diabetes mellitus   . Hypertension   . Atrial fibrillation   . Chronic kidney disease (CKD), stage III (moderate)   . Hypothyroidism     Past Surgical History  Procedure Laterality Date  . Replacement total hip w/  resurfacing implants      bilateral  . Colonoscopy      remote past at least 10 years ago  . Cardiac catheterization      no stent     Prior to Admission medications   Medication Sig Start Date End Date Taking? Authorizing Provider  aspirin 81 MG chewable tablet Chew 81 mg by mouth every other day.   Yes Historical Provider, MD  diltiazem (TIAZAC) 240 MG 24 hr capsule Take 240 mg by mouth daily.   Yes Historical Provider, MD  diphenhydrAMINE (BENADRYL) 25 MG tablet Take 25 mg by mouth every 6 (six) hours as needed. For sleep   Yes Historical Provider, MD   doxazosin (CARDURA) 2 MG tablet Take 2 mg by mouth at bedtime.   Yes Historical Provider, MD  levothyroxine (SYNTHROID, LEVOTHROID) 50 MCG tablet Take 50 mcg by mouth daily.   Yes Historical Provider, MD  lisinopril-hydrochlorothiazide (PRINZIDE,ZESTORETIC) 20-25 MG per tablet Take 1 tablet by mouth daily.   Yes Historical Provider, MD  metFORMIN (GLUCOPHAGE) 500 MG tablet Take 1,000 mg by mouth at bedtime.   Yes Historical Provider, MD  metoprolol succinate (TOPROL-XL) 25 MG 24 hr tablet Take 1 tablet (25 mg total) by mouth daily. 07/10/11  Yes Malvin Johns, MD  pravastatin (PRAVACHOL) 40 MG tablet Take 80 mg by mouth daily.   Yes Historical Provider, MD  warfarin (COUMADIN) 3 MG tablet Take 3 mg by mouth daily.   Yes Historical Provider, MD    Current Facility-Administered Medications  Medication Dose Route Frequency Provider Last Rate Last Dose  . 0.9 %  sodium chloride infusion   Intravenous Continuous Tanna Savoy Stinson, DO      . acetaminophen (TYLENOL) tablet 650 mg  650 mg Oral Q6H PRN Truett Mainland, DO       Or  . acetaminophen (TYLENOL) suppository 650 mg  650 mg Rectal Q6H PRN Truett Mainland, DO      . alum & mag hydroxide-simeth (MAALOX/MYLANTA) 200-200-20 MG/5ML suspension 30 mL  30 mL Oral Q6H PRN Tanna Savoy Stinson, DO      . aspirin chewable tablet 81 mg  81 mg Oral Trudi Ida  J Stinson, DO   81 mg at 12/01/13 9983  . diltiazem (CARDIZEM CD) 24 hr capsule 240 mg  240 mg Oral Daily Tanna Savoy Stinson, DO   240 mg at 12/01/13 3825  . docusate sodium (COLACE) capsule 100 mg  100 mg Oral Daily PRN Tanna Savoy Stinson, DO      . doxazosin (CARDURA) tablet 2 mg  2 mg Oral QHS Jacob J Stinson, DO      . lisinopril (PRINIVIL,ZESTRIL) tablet 20 mg  20 mg Oral Daily Rexene Alberts, MD   20 mg at 12/01/13 0539   And  . hydrochlorothiazide (HYDRODIURIL) tablet 25 mg  25 mg Oral Daily Rexene Alberts, MD   25 mg at 12/01/13 7673  . levothyroxine (SYNTHROID, LEVOTHROID) tablet 50 mcg  50 mcg Oral QAC  breakfast Truett Mainland, DO   50 mcg at 12/01/13 4193  . metFORMIN (GLUCOPHAGE) tablet 1,000 mg  1,000 mg Oral QHS Tanna Savoy Stinson, DO      . metoprolol succinate (TOPROL-XL) 24 hr tablet 25 mg  25 mg Oral Daily Tanna Savoy Stinson, DO   25 mg at 12/01/13 7902  . morphine 2 MG/ML injection 2 mg  2 mg Intravenous Q4H PRN Tanna Savoy Stinson, DO      . ondansetron [facility]) tablet 4 mg  4 mg Oral Q6H PRN Tanna Savoy Stinson, DO       Or  . ondansetron [facility]) injection 4 mg  4 mg Intravenous Q6H PRN Tanna Savoy Stinson, DO      . simvastatin (ZOCOR) tablet 40 mg  40 mg Oral q1800 Tanna Savoy Stinson, DO      . sodium chloride 0.9 % bolus 500 mL  500 mL Intravenous Once Sharyon Cable, MD      . sodium chloride 0.9 % injection 3 mL  3 mL Intravenous Q12H Tanna Savoy Stinson, DO      . sodium chloride 0.9 % injection           . warfarin (COUMADIN) tablet 3 mg  3 mg Oral q1800 Rexene Alberts, MD      . Warfarin - Physician Dosing Inpatient   Does not apply [id number] Rexene Alberts, MD        Allergies as of 12/01/2013  . (No Known Allergies)    Family History  Problem Relation Age of Onset  . Colon cancer Neg Hx     History   Social History  . Marital Status: Divorced    Spouse Name: N/A    Number of Children: N/A  . Years of Education: N/A   Occupational History  . Not on file.   Social History Main Topics  . Smoking status: Former Research scientist (life sciences)  . Smokeless tobacco: Not on file  . Alcohol Use: Yes     Comment: occ  . Drug Use: No  . Sexual Activity: Not Currently   Other Topics Concern  . Not on file   Social History Narrative  . No narrative on file    Review of Systems: As mentioned in HPI  Physical Exam: Vital signs in last 24 hours: Temp:  [98.9 F (37.2 C)-99.7 F (37.6 C)] 99.7 F (37.6 C) (08/19 0800) Pulse Rate:  [49-97] 97 (08/19 0800) Resp:  [19-27] 20 (08/19 0600) BP: (97-129)/(54-79) 118/67 mmHg (08/19 0800) SpO2:  [87 %-99 %] 99 % (08/19 0800) Weight:  [220 lb (99.791  kg)-226 lb 3.1 oz (102.6 kg)] 226 lb 3.1 oz (102.6 kg) (08/19 0800)  General:   Alert,  Well-developed, well-nourished, pleasant and cooperative in NAD Head:  Normocephalic and atraumatic. Eyes:  Sclera clear, no icterus.   Conjunctiva pink. Ears:  Normal auditory acuity. Nose:  No deformity, discharge,  or lesions. Mouth:  No deformity or lesions Lungs:  Clear throughout to auscultation.    Heart:  S1 S2 present, irregularly irregular Abdomen:  Soft, nontender and nondistended. No masses, hepatosplenomegaly or hernias noted. Normal bowel sounds, without guarding, and without rebound.   Rectal:  Deferred until time of colonoscopy.   Msk:  Symmetrical without gross deformities. Normal posture. Extremities:  Without clubbing or edema. Neurologic:  Alert and  oriented x4;  grossly normal neurologically. Skin:  Intact without significant lesions or rashes. Psych:  Alert and cooperative. Normal mood and affect.  Intake/Output from previous day:   Intake/Output this shift:    Lab Results:  Recent Labs  12/01/13 0108 12/01/13 0624  WBC 13.8* 12.9*  HGB 14.5 13.4  HCT 41.7 38.4*  PLT 204 153   BMET  Recent Labs  12/01/13 0108 12/01/13 0624  NA 141 139  K 3.6* 3.8  CL 98 101  CO2 29 28  GLUCOSE 203* 168*  BUN 23 23  CREATININE 1.51* 1.36*  CALCIUM 9.4 8.6   LFT  Recent Labs  12/01/13 0108 12/01/13 0624  PROT 7.5 6.4  ALBUMIN 4.3 3.7  AST 83* 592*  ALT 45 365*  ALKPHOS 101 134*  BILITOT 1.3* 2.5*   PT/INR  Recent Labs  12/01/13 0108  LABPROT 27.9*  INR 2.61*    Studies/Results: Ct Abdomen Pelvis W Contrast  12/01/2013   CLINICAL DATA:  Abdominal pain.  EXAM: CT ABDOMEN AND PELVIS WITH CONTRAST  TECHNIQUE: Multidetector CT imaging of the abdomen and pelvis was performed using the standard protocol following bolus administration of intravenous contrast.  CONTRAST:  43mL OMNIPAQUE IOHEXOL 300 MG/ML SOLN, 49mL OMNIPAQUE IOHEXOL 300 MG/ML SOLN  COMPARISON:   None.  FINDINGS: LUNG BASES: Included view of the lung bases demonstrate mild pleural thickening and dependent atelectasis. Lingular atelectasis versus scarring. Heart size is normal, pericardium is unremarkable. Coronary artery calcifications present.  SOLID ORGANS: The pancreas and adrenal glands are unremarkable. Multiple subcentimeter gallstones without CT findings of acute cholecystitis. Mild gallbladder distention. 4 mm dense presumed stone within the mid to distal Common bile duct, coronal 56/119. 17 mm cyst in central right lobe of the liver, the liver is otherwise unremarkable. Spleen, adrenal glands are normal in appearance.  GASTROINTESTINAL TRACT: The stomach, small and large bowel are normal in course and caliber without inflammatory changes. Sigmoid diverticulosis. Enteric contrast has not yet reached the distal small bowel. The appendix is not discretely identified, however there are no inflammatory changes in the right lower quadrant.  KIDNEYS/ URINARY TRACT: Kidneys are orthotopic, demonstrating symmetric enhancement. 4 mm right interpolar nephrolithiasis. No hydronephrosis or solid renal masses. 2.7 cm left interpolar cyst. Nonspecific bilateral perinephric stranding. The unopacified ureters are normal in course and caliber. Delayed imaging through the kidneys demonstrates symmetric prompt contrast excretion within the proximal urinary collecting system. Urinary bladder is grossly unremarkable though predominately obscured by streak artifact from hip arthroplasties.  PERITONEUM/RETROPERITONEUM: No intraperitoneal free fluid nor free air. Aortoiliac vessels are normal in course and caliber, moderate calcific atherosclerosis. No lymphadenopathy by CT size criteria. Internal reproductive organs are unremarkable.  SOFT TISSUE/OSSEOUS STRUCTURES: Status post bilateral hip arthroplasties which results in streak artifact limiting assessment of the pelvic contents. Small fat containing umbilical hernia.  IMPRESSION: Cholelithiasis, gallbladder distention with 4 mm mid to distal Common bile duct stone. No CT findings of acute cholecystitis.  4 mm nonobstructing right nephrolithiasis.   Electronically Signed   By: Elon Alas   On: 12/01/2013 04:07   Dg Chest Port 1 View  12/01/2013   CLINICAL DATA:  Chest pain  EXAM: PORTABLE CHEST - 1 VIEW  COMPARISON:  07/10/2011  FINDINGS: Mild cardiomegaly, similar to previous given differences in technique. There are low lung volumes with interstitial crowding. There is a right infrahilar opacity which is small and indistinct. Abdominal CT has been ordered, and will likely encompass this area. Calcified granuloma in the left lung. No effusion, edema, or pneumothorax.  IMPRESSION: Indistinct opacity at the medial right base could reflect atelectasis or infectious infiltrate. This area may be encompassed on abdominal CT currently pending. If not, recommend chest x-ray after convalescence.   Electronically Signed   By: Jorje Guild M.D.   On: 12/01/2013 02:20    Impression: 80 year old male admitted with acute abdominal pain with CT findings of likely CBD stone, with admitting mild bump in Tbili to 2.5 this morning; bump in transaminases from admission. Lipase 939 on admission now 582. Significant improvement in abdominal pain since admission noted. Course complicated by anticoagulation, as patient is on Coumadin daily for afib. Last dose yesterday evening, 8/18, with INR 2.61. INR would need to be 1.5  prior to ERCP due to significant increased risk of bleeding with sphincterotomy.   Discussed with Dr. Laurence Ferrari, radiologist. CBD diameter difficult to assess on CT but estimated at 10-11 mm. Recommended MRCP for further evaluation.   Will hold aspirin and Coumadin, proceed with MRCP now. ERCP could be performed if needed in next few days if INR falls to 1.5 range. Discussed this with patient, who states understanding.   Plan: MRCP now Hold Coumadin and  aspirin Recheck INR in am HFP, lipase in am Continue supportive measures Outpatient evaluation for cholecystectomy Outpatient evaluation for elective colonoscopy, routine screening purposes. Add PPI for GI prophylaxis Miralax 17 grams daily for constipation  Orvil Feil, ANP-BC [facility]     LOS: 0 days    12/01/2013, 8:54 AM

## 2013-12-01 NOTE — Progress Notes (Signed)
UR completed 

## 2013-12-01 NOTE — ED Notes (Signed)
No nausea, feels much better. Has slept for past hour.

## 2013-12-01 NOTE — Progress Notes (Signed)
12/01/13 2055 Patient complains of constipation, paged on-call physician will follow any orders given.

## 2013-12-01 NOTE — ED Provider Notes (Signed)
CSN: [id number]     Arrival date & time 12/01/13  0018 History   First MD Initiated Contact with Patient 12/01/13 0100     Chief Complaint  Patient presents with  . Chest Pain      Patient is a 80 y.o. male presenting with abdominal pain. The history is provided by the patient.  Abdominal Pain Pain location:  Generalized Pain quality: aching   Pain severity:  Moderate Onset quality:  Gradual Duration:  7 hours Timing:  Constant Progression:  Worsening Chronicity:  New Relieved by:  Nothing Worsened by:  Movement Associated symptoms: chest pain, constipation, fatigue and nausea   Associated symptoms: no dysuria and no fever   Risk factors: has not had multiple surgeries   Pt reports that soon after eating dinner he began to have diffuse abdominal pain He reports the abd pain became severe and he also had chest tightness but this is improving He reports nausea He also reports constipation for past day   Past Medical History  Diagnosis Date  . Diabetes mellitus   . Hypertension   . Atrial fibrillation    Past Surgical History  Procedure Laterality Date  . Replacement total hip w/  resurfacing implants     No family history on file. History  Substance Use Topics  . Smoking status: Former Research scientist (life sciences)  . Smokeless tobacco: Not on file  . Alcohol Use: Yes     Comment: occ    Review of Systems  Constitutional: Positive for fatigue. Negative for fever.  Cardiovascular: Positive for chest pain.  Gastrointestinal: Positive for nausea, abdominal pain and constipation. Negative for blood in stool.  Genitourinary: Negative for dysuria.  All other systems reviewed and are negative.     Allergies  Review of patient's allergies indicates no known allergies.  Home Medications   Prior to Admission medications   Medication Sig Start Date End Date Taking? Authorizing Provider  aspirin 81 MG chewable tablet Chew 81 mg by mouth every other day.   Yes Historical Provider, MD   diltiazem (TIAZAC) 240 MG 24 hr capsule Take 240 mg by mouth daily.   Yes Historical Provider, MD  diphenhydrAMINE (BENADRYL) 25 MG tablet Take 25 mg by mouth every 6 (six) hours as needed. For sleep   Yes Historical Provider, MD  doxazosin (CARDURA) 2 MG tablet Take 2 mg by mouth at bedtime.   Yes Historical Provider, MD  levothyroxine (SYNTHROID, LEVOTHROID) 50 MCG tablet Take 50 mcg by mouth daily.   Yes Historical Provider, MD  lisinopril-hydrochlorothiazide (PRINZIDE,ZESTORETIC) 20-25 MG per tablet Take 1 tablet by mouth daily.   Yes Historical Provider, MD  metFORMIN (GLUCOPHAGE) 500 MG tablet Take 1,000 mg by mouth at bedtime.   Yes Historical Provider, MD  metoprolol succinate (TOPROL-XL) 25 MG 24 hr tablet Take 1 tablet (25 mg total) by mouth daily. 07/10/11  Yes Malvin Johns, MD  pravastatin (PRAVACHOL) 40 MG tablet Take 80 mg by mouth daily.   Yes Historical Provider, MD  warfarin (COUMADIN) 3 MG tablet Take 3 mg by mouth daily.   Yes Historical Provider, MD  amiodarone (PACERONE) 100 MG tablet Take 100 mg by mouth daily.    Historical Provider, MD  B Complex-C (B-COMPLEX WITH VITAMIN C) tablet Take 1 tablet by mouth daily.    Historical Provider, MD   BP 104/58  Pulse 64  Temp(Src) 98.9 F (37.2 C) (Oral)  Resp 20  Ht 6' (1.829 m)  Wt 220 lb (99.791 kg)  BMI 29.83  kg/m2  SpO2 98% Physical Exam CONSTITUTIONAL: Well developed/well nourished HEAD: Normocephalic/atraumatic EYES: EOMI/PERRL ENMT: Mucous membranes moist NECK: supple no meningeal signs SPINE:entire spine nontender CV: irregular, no murmurs/rubs/gallops noted LUNGS: Lungs are clear to auscultation bilaterally, no apparent distress ABDOMEN: soft, distended, diffuse tenderness but significant tenderness noted to just below epigastric region.  Decreased BS noted throughout GU:no cva tenderness NEURO: Pt is awake/alert, moves all extremitiesx4 EXTREMITIES: pulses normal, full ROM SKIN: warm, color normal PSYCH:  no abnormalities of mood noted  ED Course  Procedures  1:28 AM Main complaint is abd pain He has significant tenderness Will start with Acute abd series and if unremarkable will need CT imaging Concern for bowel obstruction 1:48 AM Elevated lipase noted Will perform CT abd/pelvis 5:19 AM Ct results noted Pt feels improved D/w dr Nehemiah Settle, [facility] will admit and need GI consultation and potentially surgical consultation  Labs Review Labs Reviewed  CBC WITH DIFFERENTIAL - Abnormal; Notable for the following:    WBC 13.8 (*)    Neutro Abs 10.3 (*)    All other components within normal limits  COMPREHENSIVE METABOLIC PANEL  LIPASE, BLOOD  PROTIME-INR  I-STAT TROPOININ, ED  I-STAT CG4 LACTIC ACID, ED    Imaging Review Ct Abdomen Pelvis W Contrast  12/01/2013   CLINICAL DATA:  Abdominal pain.  EXAM: CT ABDOMEN AND PELVIS WITH CONTRAST  TECHNIQUE: Multidetector CT imaging of the abdomen and pelvis was performed using the standard protocol following bolus administration of intravenous contrast.  CONTRAST:  19mL OMNIPAQUE IOHEXOL 300 MG/ML SOLN, 77mL OMNIPAQUE IOHEXOL 300 MG/ML SOLN  COMPARISON:  None.  FINDINGS: LUNG BASES: Included view of the lung bases demonstrate mild pleural thickening and dependent atelectasis. Lingular atelectasis versus scarring. Heart size is normal, pericardium is unremarkable. Coronary artery calcifications present.  SOLID ORGANS: The pancreas and adrenal glands are unremarkable. Multiple subcentimeter gallstones without CT findings of acute cholecystitis. Mild gallbladder distention. 4 mm dense presumed stone within the mid to distal Common bile duct, coronal 56/119. 17 mm cyst in central right lobe of the liver, the liver is otherwise unremarkable. Spleen, adrenal glands are normal in appearance.  GASTROINTESTINAL TRACT: The stomach, small and large bowel are normal in course and caliber without inflammatory changes. Sigmoid diverticulosis. Enteric contrast has not  yet reached the distal small bowel. The appendix is not discretely identified, however there are no inflammatory changes in the right lower quadrant.  KIDNEYS/ URINARY TRACT: Kidneys are orthotopic, demonstrating symmetric enhancement. 4 mm right interpolar nephrolithiasis. No hydronephrosis or solid renal masses. 2.7 cm left interpolar cyst. Nonspecific bilateral perinephric stranding. The unopacified ureters are normal in course and caliber. Delayed imaging through the kidneys demonstrates symmetric prompt contrast excretion within the proximal urinary collecting system. Urinary bladder is grossly unremarkable though predominately obscured by streak artifact from hip arthroplasties.  PERITONEUM/RETROPERITONEUM: No intraperitoneal free fluid nor free air. Aortoiliac vessels are normal in course and caliber, moderate calcific atherosclerosis. No lymphadenopathy by CT size criteria. Internal reproductive organs are unremarkable.  SOFT TISSUE/OSSEOUS STRUCTURES: Status post bilateral hip arthroplasties which results in streak artifact limiting assessment of the pelvic contents. Small fat containing umbilical hernia.  IMPRESSION: Cholelithiasis, gallbladder distention with 4 mm mid to distal Common bile duct stone. No CT findings of acute cholecystitis.  4 mm nonobstructing right nephrolithiasis.   Electronically Signed   By: Elon Alas   On: 12/01/2013 04:07   Dg Chest Port 1 View  12/01/2013   CLINICAL DATA:  Chest pain  EXAM: PORTABLE  CHEST - 1 VIEW  COMPARISON:  07/10/2011  FINDINGS: Mild cardiomegaly, similar to previous given differences in technique. There are low lung volumes with interstitial crowding. There is a right infrahilar opacity which is small and indistinct. Abdominal CT has been ordered, and will likely encompass this area. Calcified granuloma in the left lung. No effusion, edema, or pneumothorax.  IMPRESSION: Indistinct opacity at the medial right base could reflect atelectasis or  infectious infiltrate. This area may be encompassed on abdominal CT currently pending. If not, recommend chest x-ray after convalescence.   Electronically Signed   By: Jorje Guild M.D.   On: 12/01/2013 02:20     EKG Interpretation   Date/Time:  Wednesday December 01 2013 00:59:44 EDT Ventricular Rate:  73 PR Interval:    QRS Duration: 126 QT Interval:  435 QTC Calculation: 479 R Axis:   5 Text Interpretation:  Atrial fibrillation Multiple ventricular premature  complexes Right bundle branch block Nonspecific T abnormalities, lateral  leads Confirmed by Christy Gentles  MD, Elenore Rota ([id number]) on 12/01/2013 1:16:35 AM      MDM   Final diagnoses:  Choledocholithiasis    Nursing notes including past medical history and social history reviewed and considered in documentation xrays reviewed and considered Labs/vital reviewed and considered     Sharyon Cable, MD 12/01/13 [phone]

## 2013-12-01 NOTE — ED Notes (Signed)
Pain that started in his chest last night, also feels pain in abd.

## 2013-12-02 ENCOUNTER — Inpatient Hospital Stay (HOSPITAL_COMMUNITY): Payer: Medicare Other

## 2013-12-02 DIAGNOSIS — R740 Nonspecific elevation of levels of transaminase and lactic acid dehydrogenase [LDH]: Secondary | ICD-10-CM

## 2013-12-02 DIAGNOSIS — I359 Nonrheumatic aortic valve disorder, unspecified: Secondary | ICD-10-CM

## 2013-12-02 DIAGNOSIS — R7401 Elevation of levels of liver transaminase levels: Secondary | ICD-10-CM | POA: Diagnosis present

## 2013-12-02 DIAGNOSIS — R062 Wheezing: Secondary | ICD-10-CM | POA: Insufficient documentation

## 2013-12-02 LAB — GLUCOSE, CAPILLARY
GLUCOSE-CAPILLARY: 123 mg/dL — AB (ref 70–99)
GLUCOSE-CAPILLARY: 203 mg/dL — AB (ref 70–99)
Glucose-Capillary: 142 mg/dL — ABNORMAL HIGH (ref 70–99)
Glucose-Capillary: 124 mg/dL — ABNORMAL HIGH (ref 70–99)
Glucose-Capillary: 124 mg/dL — ABNORMAL HIGH (ref 70–99)
Glucose-Capillary: 149 mg/dL — ABNORMAL HIGH (ref 70–99)

## 2013-12-02 LAB — CBC
HCT: 38.9 % — ABNORMAL LOW (ref 39.0–52.0)
Hemoglobin: 13.1 g/dL (ref 13.0–17.0)
MCH: 30.5 pg (ref 26.0–34.0)
MCHC: 33.7 g/dL (ref 30.0–36.0)
MCV: 90.5 fL (ref 78.0–100.0)
PLATELETS: 126 10*3/uL — AB (ref 150–400)
RBC: 4.3 MIL/uL (ref 4.22–5.81)
RDW: 13.4 % (ref 11.5–15.5)
WBC: 10.8 10*3/uL — ABNORMAL HIGH (ref 4.0–10.5)

## 2013-12-02 LAB — COMPREHENSIVE METABOLIC PANEL
ALT: 446 U/L — ABNORMAL HIGH (ref 0–53)
AST: 285 U/L — AB (ref 0–37)
Albumin: 3.4 g/dL — ABNORMAL LOW (ref 3.5–5.2)
Alkaline Phosphatase: 136 U/L — ABNORMAL HIGH (ref 39–117)
Anion gap: 12 (ref 5–15)
BUN: 23 mg/dL (ref 6–23)
CALCIUM: 8.1 mg/dL — AB (ref 8.4–10.5)
CO2: 25 mEq/L (ref 19–32)
CREATININE: 1.24 mg/dL (ref 0.50–1.35)
Chloride: 104 mEq/L (ref 96–112)
GFR calc Af Amer: 62 mL/min — ABNORMAL LOW (ref >90)
GFR calc non Af Amer: 54 mL/min — ABNORMAL LOW (ref >90)
Glucose, Bld: 132 mg/dL — ABNORMAL HIGH (ref 70–99)
Potassium: 3.5 mEq/L — ABNORMAL LOW (ref 3.7–5.3)
Sodium: 141 mEq/L (ref 137–147)
Total Bilirubin: 4 mg/dL — ABNORMAL HIGH (ref 0.3–1.2)
Total Protein: 6.4 g/dL (ref 6.0–8.3)

## 2013-12-02 LAB — PROTIME-INR
INR: 3.75 — ABNORMAL HIGH (ref 0.00–1.49)
Prothrombin Time: 37.1 seconds — ABNORMAL HIGH (ref 11.6–15.2)

## 2013-12-02 LAB — TSH: TSH: 3.13 u[IU]/mL (ref 0.350–4.500)

## 2013-12-02 LAB — HEMOGLOBIN A1C
Hgb A1c MFr Bld: 6.7 % — ABNORMAL HIGH (ref <5.7)
MEAN PLASMA GLUCOSE: 146 mg/dL — AB (ref <117)

## 2013-12-02 LAB — PRO B NATRIURETIC PEPTIDE: Pro B Natriuretic peptide (BNP): 3926 pg/mL — ABNORMAL HIGH (ref 0–450)

## 2013-12-02 LAB — LIPASE, BLOOD: LIPASE: 152 U/L — AB (ref 11–59)

## 2013-12-02 LAB — BILIRUBIN, DIRECT: Bilirubin, Direct: 2.6 mg/dL — ABNORMAL HIGH (ref 0.0–0.3)

## 2013-12-02 MED ORDER — ZOLPIDEM TARTRATE 5 MG PO TABS
5.0000 mg | ORAL_TABLET | Freq: Every evening | ORAL | Status: DC | PRN
  Administered 2013-12-02: 5 mg via ORAL
  Filled 2013-12-02: qty 1

## 2013-12-02 MED ORDER — INSULIN ASPART 100 UNIT/ML ~~LOC~~ SOLN
0.0000 [IU] | Freq: Three times a day (TID) | SUBCUTANEOUS | Status: DC
  Administered 2013-12-03: 3 [IU] via SUBCUTANEOUS
  Administered 2013-12-03: 2 [IU] via SUBCUTANEOUS
  Administered 2013-12-03: 3 [IU] via SUBCUTANEOUS
  Administered 2013-12-04: 5 [IU] via SUBCUTANEOUS
  Administered 2013-12-04: 2 [IU] via SUBCUTANEOUS
  Administered 2013-12-04: 3 [IU] via SUBCUTANEOUS
  Administered 2013-12-05: 5 [IU] via SUBCUTANEOUS
  Administered 2013-12-05 – 2013-12-06 (×4): 3 [IU] via SUBCUTANEOUS
  Administered 2013-12-07 (×2): 5 [IU] via SUBCUTANEOUS
  Administered 2013-12-07: 8 [IU] via SUBCUTANEOUS
  Administered 2013-12-08 (×2): 5 [IU] via SUBCUTANEOUS
  Administered 2013-12-08 – 2013-12-09 (×3): 3 [IU] via SUBCUTANEOUS
  Administered 2013-12-09: 8 [IU] via SUBCUTANEOUS
  Administered 2013-12-10 (×2): 3 [IU] via SUBCUTANEOUS

## 2013-12-02 MED ORDER — BISACODYL 10 MG RE SUPP: 10.0000 mg | Freq: Every day | RECTAL | Status: DC | PRN

## 2013-12-02 MED ORDER — LEVALBUTEROL HCL 0.63 MG/3ML IN NEBU
0.6300 mg | INHALATION_SOLUTION | Freq: Four times a day (QID) | RESPIRATORY_TRACT | Status: DC
  Administered 2013-12-02 – 2013-12-04 (×8): 0.63 mg via RESPIRATORY_TRACT
  Filled 2013-12-02 (×8): qty 3

## 2013-12-02 MED ORDER — POTASSIUM CHLORIDE IN NACL 20-0.9 MEQ/L-% IV SOLN
INTRAVENOUS | Status: DC
  Administered 2013-12-02 – 2013-12-03 (×2): via INTRAVENOUS

## 2013-12-02 MED ORDER — LEVALBUTEROL HCL 0.63 MG/3ML IN NEBU
0.6300 mg | INHALATION_SOLUTION | RESPIRATORY_TRACT | Status: DC | PRN
  Administered 2013-12-02 – 2013-12-04 (×2): 0.63 mg via RESPIRATORY_TRACT
  Filled 2013-12-02 (×2): qty 3

## 2013-12-02 MED ORDER — POTASSIUM CHLORIDE CRYS ER 20 MEQ PO TBCR
20.0000 meq | EXTENDED_RELEASE_TABLET | Freq: Two times a day (BID) | ORAL | Status: AC
  Administered 2013-12-02 – 2013-12-04 (×4): 20 meq via ORAL
  Filled 2013-12-02 (×4): qty 1

## 2013-12-02 MED ORDER — INSULIN ASPART 100 UNIT/ML ~~LOC~~ SOLN
0.0000 [IU] | Freq: Every day | SUBCUTANEOUS | Status: DC
  Administered 2013-12-06 – 2013-12-09 (×3): 2 [IU] via SUBCUTANEOUS

## 2013-12-02 MED ORDER — FUROSEMIDE 10 MG/ML IJ SOLN
10.0000 mg | Freq: Once | INTRAMUSCULAR | Status: AC
  Administered 2013-12-02: 10 mg via INTRAVENOUS
  Filled 2013-12-02: qty 2

## 2013-12-02 NOTE — Progress Notes (Signed)
Subjective:  No pain. Just had enema for constipation with results. No vomiting. Some loss of appetite.  Objective: Vital signs in last 24 hours: Temp:  [98.4 F (36.9 C)-99.4 F (37.4 C)] 99.3 F (37.4 C) (08/20 0555) Pulse Rate:  [75-99] 99 (08/20 0555) Resp:  [20] 20 (08/20 0555) BP: (106-132)/(57-80) 132/80 mmHg (08/20 0555) SpO2:  [91 %-96 %] 94 % (08/20 0555) Last BM Date: 11/30/13 General:   Alert,  Well-developed, well-nourished, pleasant and cooperative in NAD Head:  Normocephalic and atraumatic. Eyes:  Sclera clear, no icterus.  Chest: diffuse wheezing Heart:  Regular rate and rhythm; no murmurs, clicks, rubs,  or gallops. Abdomen:  Soft, nontender and nondistended.   Normal bowel sounds, without guarding, and without rebound.   Extremities:  Without clubbing, deformity or edema. Neurologic:  Alert and  oriented x4;  grossly normal neurologically. Skin:  Intact without significant lesions or rashes. Psych:  Alert and cooperative. Normal mood and affect.  Intake/Output from previous day: 08/19 0701 - 08/20 0700 In: 3593.3 [I.V.:3593.3] Out: 350 [Urine:350] Intake/Output this shift:    Lab Results: CBC  Recent Labs  12/01/13 0108 12/01/13 0624 12/02/13 0548  WBC 13.8* 12.9* 10.8*  HGB 14.5 13.4 13.1  HCT 41.7 38.4* 38.9*  MCV 88.3 89.3 90.5  PLT 204 153 126*   BMET  Recent Labs  12/01/13 0108 12/01/13 0624 12/02/13 0548  NA 141 139 141  K 3.6* 3.8 3.5*  CL 98 101 104  CO2 29 28 25   GLUCOSE 203* 168* 132*  BUN 23 23 23   CREATININE 1.51* 1.36* 1.24  CALCIUM 9.4 8.6 8.1*   LFTs  Recent Labs  12/01/13 0108 12/01/13 0624 12/02/13 0548  BILITOT 1.3* 2.5* 4.0*  ALKPHOS 101 134* 136*  AST 83* 592* 285*  ALT 45 365* 446*  PROT 7.5 6.4 6.4  ALBUMIN 4.3 3.7 3.4*    Recent Labs  12/01/13 0108 12/01/13 0624 12/02/13 0548  LIPASE 939* 582* 152*   PT/INR  Recent Labs  12/01/13 0108 12/02/13 0548  LABPROT 27.9* 37.1*  INR 2.61*  3.75*      Imaging Studies: Ct Abdomen Pelvis W Contrast  12/01/2013   CLINICAL DATA:  Abdominal pain.  EXAM: CT ABDOMEN AND PELVIS WITH CONTRAST  TECHNIQUE: Multidetector CT imaging of the abdomen and pelvis was performed using the standard protocol following bolus administration of intravenous contrast.  CONTRAST:  55mL OMNIPAQUE IOHEXOL 300 MG/ML SOLN, 74mL OMNIPAQUE IOHEXOL 300 MG/ML SOLN  COMPARISON:  None.  FINDINGS: LUNG BASES: Included view of the lung bases demonstrate mild pleural thickening and dependent atelectasis. Lingular atelectasis versus scarring. Heart size is normal, pericardium is unremarkable. Coronary artery calcifications present.  SOLID ORGANS: The pancreas and adrenal glands are unremarkable. Multiple subcentimeter gallstones without CT findings of acute cholecystitis. Mild gallbladder distention. 4 mm dense presumed stone within the mid to distal Common bile duct, coronal 56/119. 17 mm cyst in central right lobe of the liver, the liver is otherwise unremarkable. Spleen, adrenal glands are normal in appearance.  GASTROINTESTINAL TRACT: The stomach, small and large bowel are normal in course and caliber without inflammatory changes. Sigmoid diverticulosis. Enteric contrast has not yet reached the distal small bowel. The appendix is not discretely identified, however there are no inflammatory changes in the right lower quadrant.  KIDNEYS/ URINARY TRACT: Kidneys are orthotopic, demonstrating symmetric enhancement. 4 mm right interpolar nephrolithiasis. No hydronephrosis or solid renal masses. 2.7 cm left interpolar cyst. Nonspecific bilateral perinephric stranding. The unopacified ureters are  normal in course and caliber. Delayed imaging through the kidneys demonstrates symmetric prompt contrast excretion within the proximal urinary collecting system. Urinary bladder is grossly unremarkable though predominately obscured by streak artifact from hip arthroplasties.   PERITONEUM/RETROPERITONEUM: No intraperitoneal free fluid nor free air. Aortoiliac vessels are normal in course and caliber, moderate calcific atherosclerosis. No lymphadenopathy by CT size criteria. Internal reproductive organs are unremarkable.  SOFT TISSUE/OSSEOUS STRUCTURES: Status post bilateral hip arthroplasties which results in streak artifact limiting assessment of the pelvic contents. Small fat containing umbilical hernia.  IMPRESSION: Cholelithiasis, gallbladder distention with 4 mm mid to distal Common bile duct stone. No CT findings of acute cholecystitis.  4 mm nonobstructing right nephrolithiasis.   Electronically Signed   By: Elon Alas   On: 12/01/2013 04:07   Mr 3d Recon At Scanner  12/01/2013   CLINICAL DATA:  Abdominal pain. Gallstones with choledocholithiasis on CT. Diabetes.  EXAM: MRI ABDOMEN WITHOUT AND WITH CONTRAST (INCLUDING MRCP)  TECHNIQUE: Multiplanar multisequence MR imaging of the abdomen was performed both before and after the administration of intravenous contrast. Heavily T2-weighted images of the biliary and pancreatic ducts were obtained, and three-dimensional MRCP images were rendered by post processing.  CONTRAST:  56mL MULTIHANCE GADOBENATE DIMEGLUMINE 529 MG/ML IV SOLN  COMPARISON:  12/01/2013  FINDINGS: Despite efforts by the technologist and patient, motion artifact is present on today's exam and could not be eliminated. This reduces exam sensitivity and specificity. The 1.8 x 1.2 cm nonenhancing T2 hyperintense structure in the right hepatic lobe, image 16 of series 5, compatible with cyst.  There are a number of filling defects in the neck of the gallbladder compatible with gallstones. A periampullary duodenal diverticulum is present.  On image 23 of series 3 there is a 4 mm apparent filling defect in the CBD near the cystic duct attachment, but on multiplanar assessment I think that this probably represents a crossing vessel causing volume averaging rather than  a true stone. The previously seen distal CBD stone on CT scan is no longer well appreciated. On images 24-25 of series 3 the possibility of a fluid level in the CBD is raised, potentially from sludge. CBD caliber 7 mm, previously 10 mm. Gallbladder wall mildly thickened at 4 mm.  No abnormal pancreatic enhancement. Adrenal glands and spleen appear normal. No enhancing liver mass.  IMPRESSION: 1. I do not see the distal CBD calculi on today' s MRCP, with the understanding that there is extensive breathing motion artifact which reduces diagnostic sensitivity and specificity. CBD caliber is 7 mm, within normal limits for age. It is possible that the prior stones have passed. 2. There gallstones in the neck of the gallbladder. Crossing vessel causes a false filling defect in the CBD near the cystic duct attachment. 3. Periampullary duodenal diverticulum. 4. Mild gallbladder wall thickening.   Electronically Signed   By: Sherryl Barters M.D.   On: 12/01/2013 14:51   Dg Chest Port 1 View  12/01/2013   CLINICAL DATA:  Chest pain  EXAM: PORTABLE CHEST - 1 VIEW  COMPARISON:  07/10/2011  FINDINGS: Mild cardiomegaly, similar to previous given differences in technique. There are low lung volumes with interstitial crowding. There is a right infrahilar opacity which is small and indistinct. Abdominal CT has been ordered, and will likely encompass this area. Calcified granuloma in the left lung. No effusion, edema, or pneumothorax.  IMPRESSION: Indistinct opacity at the medial right base could reflect atelectasis or infectious infiltrate. This area may be encompassed on  abdominal CT currently pending. If not, recommend chest x-ray after convalescence.   Electronically Signed   By: Jorje Guild M.D.   On: 12/01/2013 02:20   Mr Abd W/wo Cm/mrcp  12/01/2013   CLINICAL DATA:  Abdominal pain. Gallstones with choledocholithiasis on CT. Diabetes.  EXAM: MRI ABDOMEN WITHOUT AND WITH CONTRAST (INCLUDING MRCP)  TECHNIQUE:  Multiplanar multisequence MR imaging of the abdomen was performed both before and after the administration of intravenous contrast. Heavily T2-weighted images of the biliary and pancreatic ducts were obtained, and three-dimensional MRCP images were rendered by post processing.  CONTRAST:  61mL MULTIHANCE GADOBENATE DIMEGLUMINE 529 MG/ML IV SOLN  COMPARISON:  12/01/2013  FINDINGS: Despite efforts by the technologist and patient, motion artifact is present on today's exam and could not be eliminated. This reduces exam sensitivity and specificity. The 1.8 x 1.2 cm nonenhancing T2 hyperintense structure in the right hepatic lobe, image 16 of series 5, compatible with cyst.  There are a number of filling defects in the neck of the gallbladder compatible with gallstones. A periampullary duodenal diverticulum is present.  On image 23 of series 3 there is a 4 mm apparent filling defect in the CBD near the cystic duct attachment, but on multiplanar assessment I think that this probably represents a crossing vessel causing volume averaging rather than a true stone. The previously seen distal CBD stone on CT scan is no longer well appreciated. On images 24-25 of series 3 the possibility of a fluid level in the CBD is raised, potentially from sludge. CBD caliber 7 mm, previously 10 mm. Gallbladder wall mildly thickened at 4 mm.  No abnormal pancreatic enhancement. Adrenal glands and spleen appear normal. No enhancing liver mass.  IMPRESSION: 1. I do not see the distal CBD calculi on today' s MRCP, with the understanding that there is extensive breathing motion artifact which reduces diagnostic sensitivity and specificity. CBD caliber is 7 mm, within normal limits for age. It is possible that the prior stones have passed. 2. There gallstones in the neck of the gallbladder. Crossing vessel causes a false filling defect in the CBD near the cystic duct attachment. 3. Periampullary duodenal diverticulum. 4. Mild gallbladder wall  thickening.   Electronically Signed   By: Sherryl Barters M.D.   On: 12/01/2013 14:51  [2 weeks]   Assessment: 80 year old male admitted with biliary pancreatitis. CT findings of likely CBD stone. Significant improvement in abdominal pain since admission noted. Course complicated by anticoagulation, as patient is on Coumadin daily for afib. MRCP showed 61mm filling defect in CBD near cystic duct attachment but ?of crossing vessel rather than true stone. Previously seen distal CBD stone on CT no longer appreciated. ?fluid level in CBD. GB wall mildly thickened. Exam somewhat limited by motion artifact.    Total bili up from 2.5 to 4. Not differentiated. AST down but ALT up. Lipase much improved at 152. Current [id number]. Received coumadin yesterday, on hold now.   Plan: 1. Discussed with [name] who is concerned about rising bilirubin and ?of CBD stone. Clinically patient much improved. Will discuss further with Dr. Gala Romney. Currently patient's INR is elevated and coumadin subsequently held. 2. Direct bilirubin.   LOS: 1 day   Neil Crouch  12/02/2013, 8:59 AM   Discussed with Dr. Gala Romney. Given patient's elevated INR and clinical scenario, ERCP would not be advised at this time. Consider cholecystectomy with intraoperative cholangiogram once coagulopathy as corrected. Continue to monitor LFTs.    Attending note:  Patient seen and  examined. Imaging reviewed. Patient almost certainly recently passed a common duct stone as suggested on the CT. However, MRCP findings dissuades me from offering him an ERCP prior to cholecystectomy. If, however, LFTs continue to rise significantly, then an ERCP preoperatively may be in his best interest. Otherwise, agree with normalizing INR and proceeding with laparoscopic cholecystectomy with intraoperative cholangiogram when appropriate per [name].

## 2013-12-02 NOTE — Progress Notes (Signed)
  Echocardiogram 2D Echocardiogram has been performed.  [place], [place] 12/02/2013, 3:53 PM

## 2013-12-02 NOTE — Care Management Note (Addendum)
    Page 1 of 1   12/10/2013     3:19:18 PM CARE MANAGEMENT NOTE 12/10/2013  Patient:  Brett [name], Brett [name]   Account Number:  [id number]  Date Initiated:  12/02/2013  Documentation initiated by:  Jolene Provost  Subjective/Objective Assessment:   Patient admitted with abdominal pain. Patient is from home, lives with girlfriend and is independent with ADL's. Patient has no HH services, DME's or medication needs prior to admission.     Action/Plan:   Pt on coumadin prior to admission, is managed by dr Pat Kocher. Patient plans to discharge home with self care. Will continue to follow post lab chole (planned possibly tomorrow) for [facility] needs.   Anticipated DC Date:  12/06/2013   Anticipated DC Plan:  [place]  CM consult      Choice offered to / List presented to:             Status of service:  Completed, signed off Medicare Important Message given?  YES (If response is "NO", the following Medicare IM given date fields will be blank) Date Medicare IM given:  12/03/2013 Medicare IM given by:  Jolene Provost Date Additional Medicare IM given:  12/10/2013 Additional Medicare IM given by:  Theophilus Kinds  Discharge Disposition:  HOME/SELF CARE  Per UR Regulation:    If discussed at Long Length of Stay Meetings, dates discussed:   12/07/2013  12/09/2013    Comments:  12/10/13 [place], RN BSN CM Pt had cardioversion today. Possible discharge over the weekend. No needs anticipated. Weekend staff can arrange if anything should arise.  12/02/2013 Jolene Provost, RN, MSN, [facility]

## 2013-12-02 NOTE — Progress Notes (Signed)
[facility] PROGRESS NOTE  Brett [name] WJX:[id number] DOB: 02-Sep-1932 DOA: 12/01/2013 PCP: [name], MD    Code Status: Full code Family Communication: Discussed with his wife Disposition Plan: Discharge when clinically appropriate.   Consultants:  Gastroenterology  General surgery  Procedures: 2-D echocardiogram 8/20:   Impressions: - Moderate LVH with LVEF 55-60%, cannot exclude basal inferolateral hypokinesis. Indeterminate diastolic function. Upper normal left atrial chamber size. MAC with trivial mitral regurgitation Mildly ectatic aortic root. Mildly sclerotic aortic valve with mild aortic regurgitation. Moderate RV chamber dilatation with normal contraction. Elevated PASP 56 mm mercury with increased estimated CVP.   MRCP abdomen 12/01/13  CT abdomen 12/01/13  Antibiotics:  None  HPI/Subjective: The patient says that he feels a lot better. He has less abdominal pain. He complained of constipation earlier and was relieved with a soapsuds enema. He denies nausea or vomiting.  Objective: Filed Vitals:   12/02/13 0555  BP: 132/80  Pulse: 99  Temp: 99.3 F (37.4 C)  Resp: 20   oxygen saturation 95% on room air.  Intake/Output Summary (Last 24 hours) at 12/02/13 1703 Last data filed at 12/02/13 1600  Gross per 24 hour  Intake 3790.41 ml  Output    350 ml  Net 3440.41 ml   Filed Weights   12/01/13 0048 12/01/13 0800  Weight: 99.791 kg (220 lb) 102.6 kg (226 lb 3.1 oz)    Exam:   General:  Morbidly obese 80 year old laying in bed, in no acute distress.  Cardiovascular: Irregular, irregular. Trace of pedal edema bilaterally.  Respiratory: Upper airway wheezes with no significant wheezing or crackles in the bases.  Abdomen: Obese abdomen, positive bowel sounds, soft, mildly tender in the epigastrium and right upper quadrant without guarding.  Musculoskeletal: No acute hot red joints.   Data Reviewed: Basic Metabolic Panel:  Recent  Labs Lab 12/01/13 0108 12/01/13 0624 12/02/13 0548  NA 141 139 141  K 3.6* 3.8 3.5*  CL 98 101 104  CO2 29 28 25   GLUCOSE 203* 168* 132*  BUN 23 23 23   CREATININE 1.51* 1.36* 1.24  CALCIUM 9.4 8.6 8.1*   Liver Function Tests:  Recent Labs Lab 12/01/13 0108 12/01/13 0624 12/02/13 0548  AST 83* 592* 285*  ALT 45 365* 446*  ALKPHOS 101 134* 136*  BILITOT 1.3* 2.5* 4.0*  PROT 7.5 6.4 6.4  ALBUMIN 4.3 3.7 3.4*    Recent Labs Lab 12/01/13 0108 12/01/13 0624 12/02/13 0548  LIPASE 939* 582* 152*   No results found for this basename: AMMONIA,  in the last 168 hours CBC:  Recent Labs Lab 12/01/13 0108 12/01/13 0624 12/02/13 0548  WBC 13.8* 12.9* 10.8*  NEUTROABS 10.3*  --   --   HGB 14.5 13.4 13.1  HCT 41.7 38.4* 38.9*  MCV 88.3 89.3 90.5  PLT 204 153 126*   Cardiac Enzymes: No results found for this basename: CKTOTAL, CKMB, CKMBINDEX, TROPONINI,  in the last 168 hours BNP (last 3 results)  Recent Labs  12/02/13 1212  PROBNP 3926.0*   CBG:  Recent Labs Lab 12/01/13 2022 12/02/13 0036 12/02/13 0443 12/02/13 0811 12/02/13 1149  GLUCAP 165* 203* 142* 124* 123*    No results found for this or any previous visit (from the past 240 hour(s)).   Studies: Ct Abdomen Pelvis W Contrast  12/01/2013   CLINICAL DATA:  Abdominal pain.  EXAM: CT ABDOMEN AND PELVIS WITH CONTRAST  TECHNIQUE: Multidetector CT imaging of the abdomen and pelvis was performed using the standard protocol  following bolus administration of intravenous contrast.  CONTRAST:  25mL OMNIPAQUE IOHEXOL 300 MG/ML SOLN, 40mL OMNIPAQUE IOHEXOL 300 MG/ML SOLN  COMPARISON:  None.  FINDINGS: LUNG BASES: Included view of the lung bases demonstrate mild pleural thickening and dependent atelectasis. Lingular atelectasis versus scarring. Heart size is normal, pericardium is unremarkable. Coronary artery calcifications present.  SOLID ORGANS: The pancreas and adrenal glands are unremarkable. Multiple  subcentimeter gallstones without CT findings of acute cholecystitis. Mild gallbladder distention. 4 mm dense presumed stone within the mid to distal Common bile duct, coronal 56/119. 17 mm cyst in central right lobe of the liver, the liver is otherwise unremarkable. Spleen, adrenal glands are normal in appearance.  GASTROINTESTINAL TRACT: The stomach, small and large bowel are normal in course and caliber without inflammatory changes. Sigmoid diverticulosis. Enteric contrast has not yet reached the distal small bowel. The appendix is not discretely identified, however there are no inflammatory changes in the right lower quadrant.  KIDNEYS/ URINARY TRACT: Kidneys are orthotopic, demonstrating symmetric enhancement. 4 mm right interpolar nephrolithiasis. No hydronephrosis or solid renal masses. 2.7 cm left interpolar cyst. Nonspecific bilateral perinephric stranding. The unopacified ureters are normal in course and caliber. Delayed imaging through the kidneys demonstrates symmetric prompt contrast excretion within the proximal urinary collecting system. Urinary bladder is grossly unremarkable though predominately obscured by streak artifact from hip arthroplasties.  PERITONEUM/RETROPERITONEUM: No intraperitoneal free fluid nor free air. Aortoiliac vessels are normal in course and caliber, moderate calcific atherosclerosis. No lymphadenopathy by CT size criteria. Internal reproductive organs are unremarkable.  SOFT TISSUE/OSSEOUS STRUCTURES: Status post bilateral hip arthroplasties which results in streak artifact limiting assessment of the pelvic contents. Small fat containing umbilical hernia.  IMPRESSION: Cholelithiasis, gallbladder distention with 4 mm mid to distal Common bile duct stone. No CT findings of acute cholecystitis.  4 mm nonobstructing right nephrolithiasis.   Electronically Signed   By: Elon Alas   On: 12/01/2013 04:07   Mr 3d Recon At Scanner  12/01/2013   CLINICAL DATA:  Abdominal pain.  Gallstones with choledocholithiasis on CT. Diabetes.  EXAM: MRI ABDOMEN WITHOUT AND WITH CONTRAST (INCLUDING MRCP)  TECHNIQUE: Multiplanar multisequence MR imaging of the abdomen was performed both before and after the administration of intravenous contrast. Heavily T2-weighted images of the biliary and pancreatic ducts were obtained, and three-dimensional MRCP images were rendered by post processing.  CONTRAST:  66mL MULTIHANCE GADOBENATE DIMEGLUMINE 529 MG/ML IV SOLN  COMPARISON:  12/01/2013  FINDINGS: Despite efforts by the technologist and patient, motion artifact is present on today's exam and could not be eliminated. This reduces exam sensitivity and specificity. The 1.8 x 1.2 cm nonenhancing T2 hyperintense structure in the right hepatic lobe, image 16 of series 5, compatible with cyst.  There are a number of filling defects in the neck of the gallbladder compatible with gallstones. A periampullary duodenal diverticulum is present.  On image 23 of series 3 there is a 4 mm apparent filling defect in the CBD near the cystic duct attachment, but on multiplanar assessment I think that this probably represents a crossing vessel causing volume averaging rather than a true stone. The previously seen distal CBD stone on CT scan is no longer well appreciated. On images 24-25 of series 3 the possibility of a fluid level in the CBD is raised, potentially from sludge. CBD caliber 7 mm, previously 10 mm. Gallbladder wall mildly thickened at 4 mm.  No abnormal pancreatic enhancement. Adrenal glands and spleen appear normal. No enhancing liver mass.  IMPRESSION:  1. I do not see the distal CBD calculi on today' s MRCP, with the understanding that there is extensive breathing motion artifact which reduces diagnostic sensitivity and specificity. CBD caliber is 7 mm, within normal limits for age. It is possible that the prior stones have passed. 2. There gallstones in the neck of the gallbladder. Crossing vessel causes a false  filling defect in the CBD near the cystic duct attachment. 3. Periampullary duodenal diverticulum. 4. Mild gallbladder wall thickening.   Electronically Signed   By: Sherryl Barters M.D.   On: 12/01/2013 14:51   Dg Chest Port 1 View  12/02/2013   CLINICAL DATA:  Wheezing  EXAM: PORTABLE CHEST - 1 VIEW  COMPARISON:  December 01, 2013  FINDINGS: There are calcified granulomas in the left mid lung. There is no edema or consolidation. Heart is mildly enlarged with pulmonary vascularity within normal limits. No adenopathy. No bone lesions.  IMPRESSION: No edema or consolidation. Calcified granulomas in left mid lung. Mild cardiac enlargement is stable.   Electronically Signed   By: Lowella Grip M.D.   On: 12/02/2013 13:25   Dg Chest Port 1 View  12/01/2013   CLINICAL DATA:  Chest pain  EXAM: PORTABLE CHEST - 1 VIEW  COMPARISON:  07/10/2011  FINDINGS: Mild cardiomegaly, similar to previous given differences in technique. There are low lung volumes with interstitial crowding. There is a right infrahilar opacity which is small and indistinct. Abdominal CT has been ordered, and will likely encompass this area. Calcified granuloma in the left lung. No effusion, edema, or pneumothorax.  IMPRESSION: Indistinct opacity at the medial right base could reflect atelectasis or infectious infiltrate. This area may be encompassed on abdominal CT currently pending. If not, recommend chest x-ray after convalescence.   Electronically Signed   By: Jorje Guild M.D.   On: 12/01/2013 02:20   Mr Abd W/wo Cm/mrcp  12/01/2013   CLINICAL DATA:  Abdominal pain. Gallstones with choledocholithiasis on CT. Diabetes.  EXAM: MRI ABDOMEN WITHOUT AND WITH CONTRAST (INCLUDING MRCP)  TECHNIQUE: Multiplanar multisequence MR imaging of the abdomen was performed both before and after the administration of intravenous contrast. Heavily T2-weighted images of the biliary and pancreatic ducts were obtained, and three-dimensional MRCP images were  rendered by post processing.  CONTRAST:  24mL MULTIHANCE GADOBENATE DIMEGLUMINE 529 MG/ML IV SOLN  COMPARISON:  12/01/2013  FINDINGS: Despite efforts by the technologist and patient, motion artifact is present on today's exam and could not be eliminated. This reduces exam sensitivity and specificity. The 1.8 x 1.2 cm nonenhancing T2 hyperintense structure in the right hepatic lobe, image 16 of series 5, compatible with cyst.  There are a number of filling defects in the neck of the gallbladder compatible with gallstones. A periampullary duodenal diverticulum is present.  On image 23 of series 3 there is a 4 mm apparent filling defect in the CBD near the cystic duct attachment, but on multiplanar assessment I think that this probably represents a crossing vessel causing volume averaging rather than a true stone. The previously seen distal CBD stone on CT scan is no longer well appreciated. On images 24-25 of series 3 the possibility of a fluid level in the CBD is raised, potentially from sludge. CBD caliber 7 mm, previously 10 mm. Gallbladder wall mildly thickened at 4 mm.  No abnormal pancreatic enhancement. Adrenal glands and spleen appear normal. No enhancing liver mass.  IMPRESSION: 1. I do not see the distal CBD calculi on today' s MRCP, with  the understanding that there is extensive breathing motion artifact which reduces diagnostic sensitivity and specificity. CBD caliber is 7 mm, within normal limits for age. It is possible that the prior stones have passed. 2. There gallstones in the neck of the gallbladder. Crossing vessel causes a false filling defect in the CBD near the cystic duct attachment. 3. Periampullary duodenal diverticulum. 4. Mild gallbladder wall thickening.   Electronically Signed   By: Sherryl Barters M.D.   On: 12/01/2013 14:51    Scheduled Meds: . atorvastatin  20 mg Oral q1800  . diltiazem  240 mg Oral Daily  . doxazosin  2 mg Oral QHS  . insulin aspart  0-9 Units Subcutaneous 6  times per day  . levalbuterol  0.63 mg Nebulization Q6H  . levothyroxine  50 mcg Oral QAC breakfast  . lisinopril  20 mg Oral Daily  . metoprolol succinate  25 mg Oral Daily  . pantoprazole  40 mg Oral Daily  . potassium chloride  20 mEq Oral BID  . sodium chloride  500 mL Intravenous Once  . sodium chloride  3 mL Intravenous Q12H   Continuous Infusions: . 0.9 % NaCl with KCl 20 mEq / L     Assessment and plan: Principal Problem:   Pancreatitis due to biliary obstruction Active Problems:   Choledocholithiasis   Cholelithiasis   Transaminitis   Type 2 diabetes mellitus with diabetic nephropathy   Hypertension   Atrial fibrillation   Chronic kidney disease (CKD), stage III (moderate)   Hypothyroidism   Wheezing   Obesity, morbid    1. Acute biliary pancreatitis with CT evidence of common bile duct stone on admission/cholelithiasis/choledocholithiasis. GI was consulted and ordered MRCP on 8/19. It did not reveal a common bile duct stone, but it did reveal gallstones in the neck of the bladder; possible passage of distal CBD stone. The patient's total bilirubin and AST have increased, but his lipase has improved. He may need an ERCP, but his INR is in the supratherapeutic range. He will need a laparoscopic cholecystectomy and [name] has been consulted. Will await the decrease in his INR and will hold Coumadin. The patient is symptomatically improved and his diet has been advanced to full liquids by GI. Transaminitis and hyperbilirubinemia, secondary to #1. The patient is ALT has increased, but his AST has decreased. His total bilirubin has also increased. Will continue to follow and monitor. Constipation. Relieved following soapsuds enema. We'll add daily Senokot S. Coagulopathy, secondary to warfarin. His INR has increased to 3.75. No signs of bleeding. Coumadin is on hold. We'll consider vitamin K or FFP if needed to expedite diagnostic workup. Chronic atrial fibrillation, on  chronic Coumadin therapy. His rate is controlled on Toprol-XL and diltiazem. Coumadin is on hold. Hypertension. Currently stable and controlled on lisinopril, diltiazem, and lisinopril. HCTZ is on hold. Diabetes mellitus with diabetic nephropathy. Metformin is on hold given the recent IV contrast. We'll continue sliding scale NovoLog. His hemoglobin A1c was 6.7. Stage III chronic kidney disease. His creatinine has improved from baseline. Continue to monitor. Hypothyroidism. Currently stable on Synthroid. His TSH is within normal limits at 3.1. Wheezes. Wheezing may be secondary to mild reactive airways. His wife reports that he does wheeze on a regular basis. There was concern about pulmonary edema, but his chest x-ray reveals no pulmonary edema although his pro BNP is elevated. His 2-D echocardiogram revealed preserved left ventricular systolic function with an ejection fraction of 55-60%. Xopenex nebulizer started and will decrease  the rate of his IV fluids.    Time spent: 40 minutes    [facility] Pager [phone]. If 7PM-7AM, please contact night-coverage at [email or web address], password [facility] 12/02/2013, 5:03 PM  LOS: 1 day

## 2013-12-02 NOTE — Consult Note (Signed)
Reason for Consult: Gallstone pancreatitis Referring Physician: Hospitalist  Brett Brett [name] is an 80 y.o. male.  HPI: Brett [name] is a 80 year old white male who presented to [facility] with worsening abdominal pain. He was found to have gallstone pancreatitis. An MRCP yesterday was a simple for possible, bile duct stone, though there was also mention that he does have multiple stones and Brett finding on MRCP could be do to an artifact/vessel. He denies any significant abdominal pain. He has a history of chronic atrial fibrillation, on Coumadin. He was supposed to get a 2-D echo by his cardiologist in [place] 8 months ago, but his cardiologist higher and he has not seen a cardiologist since then.  Past Medical History  Diagnosis Date  . Diabetes mellitus   . Hypertension   . Atrial fibrillation   . Chronic kidney disease (CKD), stage III (moderate)   . Hypothyroidism     Past Surgical History  Procedure Laterality Date  . Replacement total hip w/  resurfacing implants      bilateral  . Colonoscopy      remote past at least 10 years ago  . Cardiac catheterization      no stent     Family History  Problem Relation Age of Onset  . Colon cancer Neg Hx     Social History:  reports that he has quit smoking. He does not have any smokeless tobacco history on file. He reports that he drinks alcohol. He reports that he does not use illicit drugs.  Allergies: No Known Allergies  Medications: I have reviewed Brett Brett [name]'s current medications.  Results for orders placed during Brett hospital encounter of 12/01/13 (from Brett past 48 hour(s))  CBC WITH DIFFERENTIAL     Status: Abnormal   Collection Time    12/01/13  1:08 AM      Result Value Ref Range   WBC 13.8 (*) 4.0 - 10.5 K/uL   RBC 4.72  4.22 - 5.81 MIL/uL   Hemoglobin 14.5  13.0 - 17.0 g/dL   HCT 41.7  39.0 - 52.0 %   MCV 88.3  78.0 - 100.0 fL   MCH 30.7  26.0 - 34.0 pg   MCHC 34.8  30.0 - 36.0 g/dL   RDW 12.5  11.5 - 15.5 %    Platelets 204  150 - 400 K/uL   Neutrophils Relative % 74  43 - 77 %   Neutro Abs 10.3 (*) 1.7 - 7.7 K/uL   Lymphocytes Relative 18  12 - 46 %   Lymphs Abs 2.5  0.7 - 4.0 K/uL   Monocytes Relative 7  3 - 12 %   Monocytes Absolute 0.9  0.1 - 1.0 K/uL   Eosinophils Relative 1  0 - 5 %   Eosinophils Absolute 0.1  0.0 - 0.7 K/uL   Basophils Relative 0  0 - 1 %   Basophils Absolute 0.0  0.0 - 0.1 K/uL  COMPREHENSIVE METABOLIC PANEL     Status: Abnormal   Collection Time    12/01/13  1:08 AM      Result Value Ref Range   Sodium 141  137 - 147 mEq/L   Potassium 3.6 (*) 3.7 - 5.3 mEq/L   Chloride 98  96 - 112 mEq/L   CO2 29  19 - 32 mEq/L   Glucose, Bld 203 (*) 70 - 99 mg/dL   BUN 23  6 - 23 mg/dL   Creatinine, Ser 1.51 (*) 0.50 - 1.35 mg/dL  Calcium 9.4  8.4 - 10.5 mg/dL   Total Protein 7.5  6.0 - 8.3 g/dL   Albumin 4.3  3.5 - 5.2 g/dL   AST 83 (*) 0 - 37 U/L   ALT 45  0 - 53 U/L   Alkaline Phosphatase 101  39 - 117 U/L   Total Bilirubin 1.3 (*) 0.3 - 1.2 mg/dL   GFR calc non Af Amer 42 (*) >90 mL/min   GFR calc Af Amer 49 (*) >90 mL/min   Comment: (NOTE)     Brett eGFR has been calculated using Brett CKD EPI equation.     This calculation has not been validated in all clinical situations.     eGFR's persistently <90 mL/min signify possible Chronic Kidney     Disease.   Anion gap 14  5 - 15  LIPASE, BLOOD     Status: Abnormal   Collection Time    12/01/13  1:08 AM      Result Value Ref Range   Lipase 939 (*) 11 - 59 U/L  PROTIME-INR     Status: Abnormal   Collection Time    12/01/13  1:08 AM      Result Value Ref Range   Prothrombin Time 27.9 (*) 11.6 - 15.2 seconds   INR 2.61 (*) 0.00 - 1.49  I-STAT TROPOININ, ED     Status: None   Collection Time    12/01/13  1:33 AM      Result Value Ref Range   Troponin i, poc 0.00  0.00 - 0.08 ng/mL   Comment 3            Comment: Due to Brett release kinetics of cTnI,     a negative result within Brett first hours     of Brett onset  of symptoms does not rule out     myocardial infarction with certainty.     If myocardial infarction is still suspected,     repeat Brett test at appropriate intervals.  I-STAT CG4 LACTIC ACID, ED     Status: Abnormal   Collection Time    12/01/13  1:38 AM      Result Value Ref Range   Lactic Acid, Venous 2.47 (*) 0.5 - 2.2 mmol/L  LIPASE, BLOOD     Status: Abnormal   Collection Time    12/01/13  6:24 AM      Result Value Ref Range   Lipase 582 (*) 11 - 59 U/L  CBC     Status: Abnormal   Collection Time    12/01/13  6:24 AM      Result Value Ref Range   WBC 12.9 (*) 4.0 - 10.5 K/uL   RBC 4.30  4.22 - 5.81 MIL/uL   Hemoglobin 13.4  13.0 - 17.0 g/dL   HCT 38.4 (*) 39.0 - 52.0 %   MCV 89.3  78.0 - 100.0 fL   MCH 31.2  26.0 - 34.0 pg   MCHC 34.9  30.0 - 36.0 g/dL   RDW 12.9  11.5 - 15.5 %   Platelets 153  150 - 400 K/uL   Comment: DELTA CHECK NOTED  COMPREHENSIVE METABOLIC PANEL     Status: Abnormal   Collection Time    12/01/13  6:24 AM      Result Value Ref Range   Sodium 139  137 - 147 mEq/L   Potassium 3.8  3.7 - 5.3 mEq/L   Chloride 101  96 - 112 mEq/L   CO2  28  19 - 32 mEq/L   Glucose, Bld 168 (*) 70 - 99 mg/dL   BUN 23  6 - 23 mg/dL   Creatinine, Ser 1.36 (*) 0.50 - 1.35 mg/dL   Calcium 8.6  8.4 - 10.5 mg/dL   Total Protein 6.4  6.0 - 8.3 g/dL   Albumin 3.7  3.5 - 5.2 g/dL   AST 592 (*) 0 - 37 U/L   ALT 365 (*) 0 - 53 U/L   Alkaline Phosphatase 134 (*) 39 - 117 U/L   Total Bilirubin 2.5 (*) 0.3 - 1.2 mg/dL   GFR calc non Af Amer 48 (*) >90 mL/min   GFR calc Af Amer 55 (*) >90 mL/min   Comment: (NOTE)     Brett eGFR has been calculated using Brett CKD EPI equation.     This calculation has not been validated in all clinical situations.     eGFR's persistently <90 mL/min signify possible Chronic Kidney     Disease.   Anion gap 10  5 - 15  URINALYSIS, ROUTINE W REFLEX MICROSCOPIC     Status: Abnormal   Collection Time    12/01/13  7:40 AM      Result Value Ref Range    Color, Urine YELLOW  YELLOW   APPearance CLEAR  CLEAR   Specific Gravity, Urine 1.010  1.005 - 1.030   pH 5.0  5.0 - 8.0   Glucose, UA NEGATIVE  NEGATIVE mg/dL   Hgb urine dipstick SMALL (*) NEGATIVE   Bilirubin Urine NEGATIVE  NEGATIVE   Ketones, ur NEGATIVE  NEGATIVE mg/dL   Protein, ur NEGATIVE  NEGATIVE mg/dL   Urobilinogen, UA 0.2  0.0 - 1.0 mg/dL   Nitrite NEGATIVE  NEGATIVE   Leukocytes, UA NEGATIVE  NEGATIVE  URINE MICROSCOPIC-ADD ON     Status: None   Collection Time    12/01/13  7:40 AM      Result Value Ref Range   RBC / HPF 0-2  <3 RBC/hpf  GLUCOSE, CAPILLARY     Status: Abnormal   Collection Time    12/01/13  4:52 PM      Result Value Ref Range   Glucose-Capillary 123 (*) 70 - 99 mg/dL   Comment 1 Notify RN    GLUCOSE, CAPILLARY     Status: Abnormal   Collection Time    12/01/13  8:22 PM      Result Value Ref Range   Glucose-Capillary 165 (*) 70 - 99 mg/dL   Comment 1 Documented in Chart     Comment 2 Notify RN    GLUCOSE, CAPILLARY     Status: Abnormal   Collection Time    12/02/13 12:36 AM      Result Value Ref Range   Glucose-Capillary 203 (*) 70 - 99 mg/dL   Comment 1 Documented in Chart     Comment 2 Notify RN    GLUCOSE, CAPILLARY     Status: Abnormal   Collection Time    12/02/13  4:43 AM      Result Value Ref Range   Glucose-Capillary 142 (*) 70 - 99 mg/dL   Comment 1 Documented in Chart     Comment 2 Notify RN    PROTIME-INR     Status: Abnormal   Collection Time    12/02/13  5:48 AM      Result Value Ref Range   Prothrombin Time 37.1 (*) 11.6 - 15.2 seconds   INR 3.75 (*) 0.00 -  1.49  LIPASE, BLOOD     Status: Abnormal   Collection Time    12/02/13  5:48 AM      Result Value Ref Range   Lipase 152 (*) 11 - 59 U/L  CBC     Status: Abnormal   Collection Time    12/02/13  5:48 AM      Result Value Ref Range   WBC 10.8 (*) 4.0 - 10.5 K/uL   RBC 4.30  4.22 - 5.81 MIL/uL   Hemoglobin 13.1  13.0 - 17.0 g/dL   HCT 38.9 (*) 39.0 - 52.0 %    MCV 90.5  78.0 - 100.0 fL   MCH 30.5  26.0 - 34.0 pg   MCHC 33.7  30.0 - 36.0 g/dL   RDW 13.4  11.5 - 15.5 %   Platelets 126 (*) 150 - 400 K/uL  COMPREHENSIVE METABOLIC PANEL     Status: Abnormal   Collection Time    12/02/13  5:48 AM      Result Value Ref Range   Sodium 141  137 - 147 mEq/L   Potassium 3.5 (*) 3.7 - 5.3 mEq/L   Chloride 104  96 - 112 mEq/L   CO2 25  19 - 32 mEq/L   Glucose, Bld 132 (*) 70 - 99 mg/dL   BUN 23  6 - 23 mg/dL   Creatinine, Ser 1.24  0.50 - 1.35 mg/dL   Calcium 8.1 (*) 8.4 - 10.5 mg/dL   Total Protein 6.4  6.0 - 8.3 g/dL   Albumin 3.4 (*) 3.5 - 5.2 g/dL   AST 285 (*) 0 - 37 U/L   ALT 446 (*) 0 - 53 U/L   Alkaline Phosphatase 136 (*) 39 - 117 U/L   Total Bilirubin 4.0 (*) 0.3 - 1.2 mg/dL   GFR calc non Af Amer 54 (*) >90 mL/min   GFR calc Af Amer 62 (*) >90 mL/min   Comment: (NOTE)     Brett eGFR has been calculated using Brett CKD EPI equation.     This calculation has not been validated in all clinical situations.     eGFR's persistently <90 mL/min signify possible Chronic Kidney     Disease.   Anion gap 12  5 - 15  GLUCOSE, CAPILLARY     Status: Abnormal   Collection Time    12/02/13  8:11 AM      Result Value Ref Range   Glucose-Capillary 124 (*) 70 - 99 mg/dL   Comment 1 Notify RN      Ct Abdomen Pelvis W Contrast  12/01/2013   CLINICAL DATA:  Abdominal pain.  EXAM: CT ABDOMEN AND PELVIS WITH CONTRAST  TECHNIQUE: Multidetector CT imaging of Brett abdomen and pelvis was performed using Brett standard protocol following bolus administration of intravenous contrast.  CONTRAST:  61mL OMNIPAQUE IOHEXOL 300 MG/ML SOLN, 75mL OMNIPAQUE IOHEXOL 300 MG/ML SOLN  COMPARISON:  None.  FINDINGS: LUNG BASES: Included view of Brett lung bases demonstrate mild pleural thickening and dependent atelectasis. Lingular atelectasis versus scarring. Heart size is normal, pericardium is unremarkable. Coronary artery calcifications present.  SOLID ORGANS: Brett pancreas and  adrenal glands are unremarkable. Multiple subcentimeter gallstones without CT findings of acute cholecystitis. Mild gallbladder distention. 4 mm dense presumed stone within Brett mid to distal Common bile duct, coronal 56/119. 17 mm cyst in central right lobe of Brett liver, Brett liver is otherwise unremarkable. Spleen, adrenal glands are normal in appearance.  GASTROINTESTINAL TRACT: Brett stomach, small and  large bowel are normal in course and caliber without inflammatory changes. Sigmoid diverticulosis. Enteric contrast has not yet reached Brett distal small bowel. Brett appendix is not discretely identified, however there are no inflammatory changes in Brett right lower quadrant.  KIDNEYS/ URINARY TRACT: Kidneys are orthotopic, demonstrating symmetric enhancement. 4 mm right interpolar nephrolithiasis. No hydronephrosis or solid renal masses. 2.7 cm left interpolar cyst. Nonspecific bilateral perinephric stranding. Brett unopacified ureters are normal in course and caliber. Delayed imaging through Brett kidneys demonstrates symmetric prompt contrast excretion within Brett proximal urinary collecting system. Urinary bladder is grossly unremarkable though predominately obscured by streak artifact from hip arthroplasties.  PERITONEUM/RETROPERITONEUM: No intraperitoneal free fluid nor free air. Aortoiliac vessels are normal in course and caliber, moderate calcific atherosclerosis. No lymphadenopathy by CT size criteria. Internal reproductive organs are unremarkable.  SOFT TISSUE/OSSEOUS STRUCTURES: Status post bilateral hip arthroplasties which results in streak artifact limiting assessment of Brett pelvic contents. Small fat containing umbilical hernia.  IMPRESSION: Cholelithiasis, gallbladder distention with 4 mm mid to distal Common bile duct stone. No CT findings of acute cholecystitis.  4 mm nonobstructing right nephrolithiasis.   Electronically Signed   By: Elon Alas   On: 12/01/2013 04:07   Mr 3d Recon At  Scanner  12/01/2013   CLINICAL DATA:  Abdominal pain. Gallstones with choledocholithiasis on CT. Diabetes.  EXAM: MRI ABDOMEN WITHOUT AND WITH CONTRAST (INCLUDING MRCP)  TECHNIQUE: Multiplanar multisequence MR imaging of Brett abdomen was performed both before and after Brett administration of intravenous contrast. Heavily T2-weighted images of Brett biliary and pancreatic ducts were obtained, and three-dimensional MRCP images were rendered by post processing.  CONTRAST:  53m MULTIHANCE GADOBENATE DIMEGLUMINE 529 MG/ML IV SOLN  COMPARISON:  12/01/2013  FINDINGS: Despite efforts by Brett technologist and Brett [name], motion artifact is present on today's exam and could not be eliminated. This reduces exam sensitivity and specificity. Brett 1.8 x 1.2 cm nonenhancing T2 hyperintense structure in Brett right hepatic lobe, image 16 of series 5, compatible with cyst.  There are a number of filling defects in Brett neck of Brett gallbladder compatible with gallstones. A periampullary duodenal diverticulum is present.  On image 23 of series 3 there is a 4 mm apparent filling defect in Brett CBD near Brett cystic duct attachment, but on multiplanar assessment I think that this probably represents a crossing vessel causing volume averaging rather than a true stone. Brett previously seen distal CBD stone on CT scan is no longer well appreciated. On images 24-25 of series 3 Brett possibility of a fluid level in Brett CBD is raised, potentially from sludge. CBD caliber 7 mm, previously 10 mm. Gallbladder wall mildly thickened at 4 mm.  No abnormal pancreatic enhancement. Adrenal glands and spleen appear normal. No enhancing liver mass.  IMPRESSION: 1. I do not see Brett distal CBD calculi on today' s MRCP, with Brett understanding that there is extensive breathing motion artifact which reduces diagnostic sensitivity and specificity. CBD caliber is 7 mm, within normal limits for age. It is possible that Brett prior stones have passed. 2. There gallstones in Brett  neck of Brett gallbladder. Crossing vessel causes a false filling defect in Brett CBD near Brett cystic duct attachment. 3. Periampullary duodenal diverticulum. 4. Mild gallbladder wall thickening.   Electronically Signed   By: WSherryl BartersM.D.   On: 12/01/2013 14:51   Dg Chest Port 1 View  12/01/2013   CLINICAL DATA:  Chest pain  EXAM: PORTABLE CHEST - 1 VIEW  COMPARISON:  07/10/2011  FINDINGS: Mild cardiomegaly, similar to previous given differences in technique. There are low lung volumes with interstitial crowding. There is a right infrahilar opacity which is small and indistinct. Abdominal CT has been ordered, and will likely encompass this area. Calcified granuloma in Brett left lung. No effusion, edema, or pneumothorax.  IMPRESSION: Indistinct opacity at Brett medial right base could reflect atelectasis or infectious infiltrate. This area may be encompassed on abdominal CT currently pending. If not, recommend chest x-ray after convalescence.   Electronically Signed   By: Jorje Guild M.D.   On: 12/01/2013 02:20   Mr Abd W/wo Cm/mrcp  12/01/2013   CLINICAL DATA:  Abdominal pain. Gallstones with choledocholithiasis on CT. Diabetes.  EXAM: MRI ABDOMEN WITHOUT AND WITH CONTRAST (INCLUDING MRCP)  TECHNIQUE: Multiplanar multisequence MR imaging of Brett abdomen was performed both before and after Brett administration of intravenous contrast. Heavily T2-weighted images of Brett biliary and pancreatic ducts were obtained, and three-dimensional MRCP images were rendered by post processing.  CONTRAST:  22m MULTIHANCE GADOBENATE DIMEGLUMINE 529 MG/ML IV SOLN  COMPARISON:  12/01/2013  FINDINGS: Despite efforts by Brett technologist and Brett [name], motion artifact is present on today's exam and could not be eliminated. This reduces exam sensitivity and specificity. Brett 1.8 x 1.2 cm nonenhancing T2 hyperintense structure in Brett right hepatic lobe, image 16 of series 5, compatible with cyst.  There are a number of filling defects  in Brett neck of Brett gallbladder compatible with gallstones. A periampullary duodenal diverticulum is present.  On image 23 of series 3 there is a 4 mm apparent filling defect in Brett CBD near Brett cystic duct attachment, but on multiplanar assessment I think that this probably represents a crossing vessel causing volume averaging rather than a true stone. Brett previously seen distal CBD stone on CT scan is no longer well appreciated. On images 24-25 of series 3 Brett possibility of a fluid level in Brett CBD is raised, potentially from sludge. CBD caliber 7 mm, previously 10 mm. Gallbladder wall mildly thickened at 4 mm.  No abnormal pancreatic enhancement. Adrenal glands and spleen appear normal. No enhancing liver mass.  IMPRESSION: 1. I do not see Brett distal CBD calculi on today' s MRCP, with Brett understanding that there is extensive breathing motion artifact which reduces diagnostic sensitivity and specificity. CBD caliber is 7 mm, within normal limits for age. It is possible that Brett prior stones have passed. 2. There gallstones in Brett neck of Brett gallbladder. Crossing vessel causes a false filling defect in Brett CBD near Brett cystic duct attachment. 3. Periampullary duodenal diverticulum. 4. Mild gallbladder wall thickening.   Electronically Signed   By: WSherryl BartersM.D.   On: 12/01/2013 14:51    ROS: See chart Blood pressure 132/80, pulse 99, temperature 99.3 F (37.4 C), temperature source Oral, resp. rate 20, height 6' (1.829 m), weight 102.6 kg (226 lb 3.1 oz), SpO2 94.00%. Physical Exam: Pleasant white male no acute distress. Abdomen is distended but not tense. Brett [name] denies any tenderness. No rigidity is noted.   Assessment/Plan: Impression: Gallstone pancreatitis, chronic atrial fibrillation with anticoagulation, question common bile duct stone Even though Brett MRCP was read as negative, his bilirubin has climbed to 4. He also has an INR of 3.65. I would not restart his Coumadin. He will need  laparoscopic cholecystectomy prior to discharge. I have ordered a 2-D echo.  Saraia Platner A 12/02/2013, 8:38 AM

## 2013-12-03 ENCOUNTER — Encounter (HOSPITAL_COMMUNITY): Payer: Self-pay | Admitting: Internal Medicine

## 2013-12-03 DIAGNOSIS — D689 Coagulation defect, unspecified: Secondary | ICD-10-CM

## 2013-12-03 DIAGNOSIS — I272 Pulmonary hypertension, unspecified: Secondary | ICD-10-CM | POA: Diagnosis present

## 2013-12-03 DIAGNOSIS — D696 Thrombocytopenia, unspecified: Secondary | ICD-10-CM | POA: Diagnosis not present

## 2013-12-03 DIAGNOSIS — K859 Acute pancreatitis without necrosis or infection, unspecified: Principal | ICD-10-CM

## 2013-12-03 HISTORY — DX: Pulmonary hypertension, unspecified: I27.20

## 2013-12-03 LAB — BASIC METABOLIC PANEL
ANION GAP: 14 (ref 5–15)
BUN: 19 mg/dL (ref 6–23)
CO2: 24 mEq/L (ref 19–32)
Calcium: 8.2 mg/dL — ABNORMAL LOW (ref 8.4–10.5)
Chloride: 100 mEq/L (ref 96–112)
Creatinine, Ser: 1.1 mg/dL (ref 0.50–1.35)
GFR, EST AFRICAN AMERICAN: 72 mL/min — AB (ref >90)
GFR, EST NON AFRICAN AMERICAN: 62 mL/min — AB (ref >90)
Glucose, Bld: 194 mg/dL — ABNORMAL HIGH (ref 70–99)
POTASSIUM: 3.8 meq/L (ref 3.7–5.3)
SODIUM: 138 meq/L (ref 137–147)

## 2013-12-03 LAB — PROTIME-INR
INR: 3.47 — ABNORMAL HIGH (ref 0.00–1.49)
Prothrombin Time: 34.9 seconds — ABNORMAL HIGH (ref 11.6–15.2)

## 2013-12-03 LAB — HEPATIC FUNCTION PANEL
ALT: 278 U/L — ABNORMAL HIGH (ref 0–53)
AST: 96 U/L — ABNORMAL HIGH (ref 0–37)
Albumin: 3.3 g/dL — ABNORMAL LOW (ref 3.5–5.2)
Alkaline Phosphatase: 140 U/L — ABNORMAL HIGH (ref 39–117)
BILIRUBIN DIRECT: 1.6 mg/dL — AB (ref 0.0–0.3)
Indirect Bilirubin: 1.7 mg/dL — ABNORMAL HIGH (ref 0.3–0.9)
Total Bilirubin: 3.3 mg/dL — ABNORMAL HIGH (ref 0.3–1.2)
Total Protein: 6.5 g/dL (ref 6.0–8.3)

## 2013-12-03 LAB — MRSA PCR SCREENING: MRSA by PCR: NEGATIVE

## 2013-12-03 LAB — VITAMIN B12: Vitamin B-12: 325 pg/mL (ref 211–911)

## 2013-12-03 LAB — DIC (DISSEMINATED INTRAVASCULAR COAGULATION) PANEL
FIBRINOGEN: 636 mg/dL — AB (ref 204–475)
Prothrombin Time: 33.8 seconds — ABNORMAL HIGH (ref 11.6–15.2)
SMEAR REVIEW: NONE SEEN

## 2013-12-03 LAB — GLUCOSE, CAPILLARY
GLUCOSE-CAPILLARY: 184 mg/dL — AB (ref 70–99)
Glucose-Capillary: 141 mg/dL — ABNORMAL HIGH (ref 70–99)
Glucose-Capillary: 162 mg/dL — ABNORMAL HIGH (ref 70–99)
Glucose-Capillary: 164 mg/dL — ABNORMAL HIGH (ref 70–99)
Glucose-Capillary: 187 mg/dL — ABNORMAL HIGH (ref 70–99)

## 2013-12-03 LAB — CBC
HCT: 38.1 % — ABNORMAL LOW (ref 39.0–52.0)
Hemoglobin: 12.7 g/dL — ABNORMAL LOW (ref 13.0–17.0)
MCH: 30.2 pg (ref 26.0–34.0)
MCHC: 33.3 g/dL (ref 30.0–36.0)
MCV: 90.7 fL (ref 78.0–100.0)
PLATELETS: 108 10*3/uL — AB (ref 150–400)
RBC: 4.2 MIL/uL — AB (ref 4.22–5.81)
RDW: 13.4 % (ref 11.5–15.5)
WBC: 9.1 10*3/uL (ref 4.0–10.5)

## 2013-12-03 LAB — DIC (DISSEMINATED INTRAVASCULAR COAGULATION)PANEL
D-Dimer, Quant: 2 ug/mL-FEU — ABNORMAL HIGH (ref 0.00–0.48)
INR: 3.33 — ABNORMAL HIGH (ref 0.00–1.49)
Platelets: 98 10*3/uL — ABNORMAL LOW (ref 150–400)
aPTT: 66 seconds — ABNORMAL HIGH (ref 24–37)

## 2013-12-03 LAB — RETICULOCYTES
RBC.: 3.96 MIL/uL — ABNORMAL LOW (ref 4.22–5.81)
Retic Count, Absolute: 63.4 10*3/uL (ref 19.0–186.0)
Retic Ct Pct: 1.6 % (ref 0.4–3.1)

## 2013-12-03 LAB — LACTIC ACID, PLASMA: LACTIC ACID, VENOUS: 2.8 mmol/L — AB (ref 0.5–2.2)

## 2013-12-03 MED ORDER — DILTIAZEM LOAD VIA INFUSION
10.0000 mg | Freq: Once | INTRAVENOUS | Status: DC
  Filled 2013-12-03: qty 10

## 2013-12-03 MED ORDER — PREDNISONE 20 MG PO TABS
40.0000 mg | ORAL_TABLET | Freq: Every day | ORAL | Status: DC
  Administered 2013-12-03: 40 mg via ORAL
  Filled 2013-12-03: qty 2

## 2013-12-03 MED ORDER — LEVOFLOXACIN IN D5W 500 MG/100ML IV SOLN
500.0000 mg | INTRAVENOUS | Status: DC
  Administered 2013-12-03 – 2013-12-04 (×2): 500 mg via INTRAVENOUS
  Filled 2013-12-03 (×4): qty 100

## 2013-12-03 MED ORDER — LORAZEPAM 0.5 MG PO TABS
0.2500 mg | ORAL_TABLET | Freq: Every evening | ORAL | Status: DC | PRN
  Administered 2013-12-03 – 2013-12-05 (×3): 0.25 mg via ORAL
  Filled 2013-12-03 (×3): qty 1

## 2013-12-03 MED ORDER — SENNOSIDES-DOCUSATE SODIUM 8.6-50 MG PO TABS
1.0000 | ORAL_TABLET | Freq: Every day | ORAL | Status: DC
  Administered 2013-12-03 – 2013-12-10 (×7): 1 via ORAL
  Filled 2013-12-03 (×8): qty 1

## 2013-12-03 MED ORDER — DIGOXIN 0.25 MG/ML IJ SOLN
0.1250 mg | Freq: Once | INTRAMUSCULAR | Status: AC
  Administered 2013-12-03: 0.125 mg via INTRAVENOUS
  Filled 2013-12-03: qty 2

## 2013-12-03 MED ORDER — DILTIAZEM HCL 25 MG/5ML IV SOLN
10.0000 mg | INTRAVENOUS | Status: AC
  Administered 2013-12-03: 10 mg via INTRAVENOUS

## 2013-12-03 MED ORDER — DILTIAZEM HCL 25 MG/5ML IV SOLN
INTRAVENOUS | Status: AC
Start: 2013-12-03 — End: 2013-12-03
  Filled 2013-12-03: qty 5

## 2013-12-03 MED ORDER — DILTIAZEM HCL 25 MG/5ML IV SOLN
INTRAVENOUS | Status: AC
  Filled 2013-12-03: qty 5

## 2013-12-03 MED ORDER — DILTIAZEM HCL 25 MG/5ML IV SOLN: 10.0000 mg | Freq: Once | INTRAVENOUS | Status: AC

## 2013-12-03 MED ORDER — DILTIAZEM HCL 100 MG IV SOLR
5.0000 mg/h | INTRAVENOUS | Status: DC
  Administered 2013-12-03: 5 mg/h via INTRAVENOUS
  Administered 2013-12-04 (×3): 15 mg/h via INTRAVENOUS
  Administered 2013-12-05 (×2): 20 mg/h via INTRAVENOUS
  Filled 2013-12-03: qty 100

## 2013-12-03 NOTE — Progress Notes (Addendum)
Pt transferred to the unit from 300. Pt is alert and oriented and has no complaints at this time. Pt is currently in a-fib with RVR per monitor; pt denies any chest discomfort or difficulties breathing. Cardizem drip has been initiated. Will continue to monitor.

## 2013-12-03 NOTE — Progress Notes (Signed)
REVIEWED.  

## 2013-12-03 NOTE — Progress Notes (Signed)
[facility] PROGRESS NOTE  Jensen Kilburg PNT:[id number] DOB: May 15, 1932 DOA: 12/01/2013 PCP: [name], MD    Code Status: Full code Family Communication: Discussed with his wife Disposition Plan: Discharge when clinically appropriate.   Consultants:  Gastroenterology  General surgery  Procedures: 2-D echocardiogram 8/20:   Impressions: - Moderate LVH with LVEF 55-60%, cannot exclude basal inferolateral hypokinesis. Indeterminate diastolic function. Upper normal left atrial chamber size. MAC with trivial mitral regurgitation Mildly ectatic aortic root. Mildly sclerotic aortic valve with mild aortic regurgitation. Moderate RV chamber dilatation with normal contraction. Elevated PASP 56 mm mercury with increased estimated CVP.   MRCP abdomen 12/01/13  CT abdomen 12/01/13  Antibiotics:  Levaquin pending 12/03/13  HPI/Subjective: The patient was apparently wheezing more overnight and was given IV Lasix. He acknowledges urinating a lot. He continues to have some wheezing mostly in the upper airway. He denies coughing or getting choked while drinking liquids.  Objective: Filed Vitals:   12/03/13 0535  BP: 137/83  Pulse: 105  Temp: 98.1 F (36.7 C)  Resp: 20   oxygen saturation 96 percent on supplemental oxygen.  Intake/Output Summary (Last 24 hours) at 12/03/13 1419 Last data filed at 12/03/13 1313  Gross per 24 hour  Intake 1557.08 ml  Output    200 ml  Net 1357.08 ml   Filed Weights   12/01/13 0048 12/01/13 0800  Weight: 99.791 kg (220 lb) 102.6 kg (226 lb 3.1 oz)    Exam:   General:  Morbidly obese 80 year old laying in bed, asleep, in no acute distress.  Cardiovascular: Irregular, irregular. No pedal edema.  Respiratory: Upper airway wheezes with no significant wheezing or crackles throughout the remainder of his lungs.  Abdomen: Obese abdomen, positive bowel sounds, soft, mildly tender in the epigastrium and right upper quadrant without  guarding.  Musculoskeletal: No acute hot red joints.   Neurologic: He is alert when aroused and is oriented x3. Cranial nerves II through XII are grossly intact.  Data Reviewed: Basic Metabolic Panel:  Recent Labs Lab 12/01/13 0108 12/01/13 0624 12/02/13 0548 12/03/13 0530  NA 141 139 141 138  K 3.6* 3.8 3.5* 3.8  CL 98 101 104 100  CO2 29 28 25 24   GLUCOSE 203* 168* 132* 194*  BUN 23 23 23 19   CREATININE 1.51* 1.36* 1.24 1.10  CALCIUM 9.4 8.6 8.1* 8.2*   Liver Function Tests:  Recent Labs Lab 12/01/13 0108 12/01/13 0624 12/02/13 0548 12/03/13 0530  AST 83* 592* 285* 96*  ALT 45 365* 446* 278*  ALKPHOS 101 134* 136* 140*  BILITOT 1.3* 2.5* 4.0* 3.3*  PROT 7.5 6.4 6.4 6.5  ALBUMIN 4.3 3.7 3.4* 3.3*    Recent Labs Lab 12/01/13 0108 12/01/13 0624 12/02/13 0548  LIPASE 939* 582* 152*   No results found for this basename: AMMONIA,  in the last 168 hours CBC:  Recent Labs Lab 12/01/13 0108 12/01/13 0624 12/02/13 0548 12/03/13 0530  WBC 13.8* 12.9* 10.8* 9.1  NEUTROABS 10.3*  --   --   --   HGB 14.5 13.4 13.1 12.7*  HCT 41.7 38.4* 38.9* 38.1*  MCV 88.3 89.3 90.5 90.7  PLT 204 153 126* 108*   Cardiac Enzymes: No results found for this basename: CKTOTAL, CKMB, CKMBINDEX, TROPONINI,  in the last 168 hours BNP (last 3 results)  Recent Labs  12/02/13 1212  PROBNP 3926.0*   CBG:  Recent Labs Lab 12/02/13 1721 12/02/13 2104 12/03/13 0013 12/03/13 0726 12/03/13 1111  GLUCAP 124* 149* 162* 184* 164*  No results found for this or any previous visit (from the past 240 hour(s)).   Studies: Dg Chest Port 1 View  12/02/2013   CLINICAL DATA:  Wheezing  EXAM: PORTABLE CHEST - 1 VIEW  COMPARISON:  December 01, 2013  FINDINGS: There are calcified granulomas in the left mid lung. There is no edema or consolidation. Heart is mildly enlarged with pulmonary vascularity within normal limits. No adenopathy. No bone lesions.  IMPRESSION: No edema or  consolidation. Calcified granulomas in left mid lung. Mild cardiac enlargement is stable.   Electronically Signed   By: Lowella Grip M.D.   On: 12/02/2013 13:25    Scheduled Meds: . atorvastatin  20 mg Oral q1800  . diltiazem  240 mg Oral Daily  . doxazosin  2 mg Oral QHS  . insulin aspart  0-15 Units Subcutaneous TID WC  . insulin aspart  0-5 Units Subcutaneous QHS  . levalbuterol  0.63 mg Nebulization Q6H  . levofloxacin (LEVAQUIN) IV  500 mg Intravenous Q24H  . levothyroxine  50 mcg Oral QAC breakfast  . lisinopril  20 mg Oral Daily  . metoprolol succinate  25 mg Oral Daily  . potassium chloride  20 mEq Oral BID  . predniSONE  40 mg Oral Q supper  . sodium chloride  500 mL Intravenous Once  . sodium chloride  3 mL Intravenous Q12H   Continuous Infusions: . 0.9 % NaCl with KCl 20 mEq / L 20 mL/hr (12/02/13 2301)   Assessment and plan: Principal Problem:   Pancreatitis due to biliary obstruction Active Problems:   Choledocholithiasis   Cholelithiasis   Transaminitis   Thrombocytopenia, unspecified   Type 2 diabetes mellitus with diabetic nephropathy   Hypertension   Atrial fibrillation   Chronic kidney disease (CKD), stage III (moderate)   Hypothyroidism   Wheezing   Obesity, morbid    1. Acute biliary pancreatitis with CT evidence of common bile duct stone on admission/cholelithiasis/choledocholithiasis. CT on admission revealed a common bile duct stone. GI was consulted and ordered MRCP on 8/19. It did not reveal a common bile duct stone, but it did reveal gallstones in the neck of the bladder; possible passage of distal CBD stone. The patient's lipase, total bilirubin, and liver transaminases are now trending downward.  ERCP was considered, but is being held off for now per GI. He will need a laparoscopic cholecystectomy and [name] plans to do it early next week. Will await the decrease in his INR and will continue to hold Coumadin. The patient is  symptomatically improved and his diet has been advanced to full liquids by GI. Transaminitis and hyperbilirubinemia, secondary to #1. The patient's liver transaminases and total bilirubin are now trending downward. Continue to monitor. Constipation. Relieved following soapsuds enema. We'll start daily Senokot S. Coagulopathy, secondary to warfarin. His INR has increased to 3. No signs of bleeding. Coumadin is on hold. We'll consider vitamin K or FFP if needed to expedite treatment. Thrombocytopenia. Etiology is unclear. His platelet count was 204 on admission and has decreased daily. I do not believe he was given heparin as he is already anticoagulated. His spleen on CT scan was apparently "normal "per radiology. He was started on Protonix on admission which can cause thrombocytopenia, so it has been discontinued. His TSH is within normal limits. We will order a vitamin B12 level for evaluation and consult hematology. Currently, there are no signs of bleeding. Chronic atrial fibrillation, on chronic Coumadin therapy. His rate is controlled on Toprol-XL  and diltiazem. Coumadin is on hold. Ejection fraction 55-60% per 2-D echocardiogram. Pulmonary hypertension, per 2-D echocardiogram. Echo revealed peak pulmonary artery pressure of 56 mm of mercury. He does not appear to have cor pulmonale. Continue to monitor. Hypertension. Currently stable and controlled on lisinopril, diltiazem, and lisinopril. HCTZ is on hold. Diabetes mellitus with diabetic nephropathy. Metformin is on hold given the recent IV contrast. We'll continue sliding scale NovoLog. His hemoglobin A1c was 6.7. Stage III chronic kidney disease. His creatinine has improved from baseline. Continue to monitor. Hypothyroidism. Currently stable on Synthroid. His TSH is within normal limits at 3.1. Wheezes, possibly upper airway reactive airways or acute bronchitis. The patient was given Lasix overnight and his IV fluids were decreased for  increase in wheezing, but the patient has had upper airway wheezes since admission. According to the history, this appears to be chronic. His chest x-ray revealed no pulmonary edema and his 2-D echocardiogram revealed preserved left ventricle systolic function. I do not believe he has congestive heart failure. Most of his wheezes are upper airway and the patient may be an ENT evaluation as an outpatient. He had been started on a PPI, but it was discontinued today because of his thrombocytopenia. We'll treat with Xopenex as ordered. Will add Levaquin empirically for possible acute bronchitis. Will give him one dose of oral prednisone. We'll add guaifenesin. Continue to monitor.    Time spent: 35 minutes    [facility] Pager [phone]. If 7PM-7AM, please contact night-coverage at [email or web address], password [facility] 12/03/2013, 2:19 PM  LOS: 2 days

## 2013-12-03 NOTE — Consult Note (Signed)
[facility] Consultation Oncology  Name: Brett [name]      MRN: [id number]    Location: A317/A317-01  Date: 12/03/2013 Time:2:32 PM   REFERRING PHYSICIAN:  Rexene Alberts, MD  REASON FOR CONSULT:  Thrombocytopenia   DIAGNOSIS:  Thrombocytopenia in the setting of acute pancreatitis  HISTORY OF PRESENT ILLNESS:   Mr. Brett [name] is a 80 yo man with a past medical history significant for a-fib on Coumadin anticoagulation, DM with nephropathy, Stage III chronic renal disease, and hypothyroidism who presented to the [facility] ED on 12/01/2013 with LUQ abdominal pain.  Hematology is consulted due to thrombocytopenia beginning on 12/02/2013 and a little worse today at 108,000.  He reports that his primary care provider is [name] and he denies ever learning that he had any issue with his platelets.   He denies any bleeding including gingival bleeding, epistaxis, hemoptysis, blood in stool, black tarry stool, and rash.  Hematologically, he denies any and ROS questioning is negative  PAST MEDICAL HISTORY:   Past Medical History  Diagnosis Date  . Diabetes mellitus   . Hypertension   . Atrial fibrillation   . Chronic kidney disease (CKD), stage III (moderate)   . Hypothyroidism     ALLERGIES: No Known Allergies    MEDICATIONS: I have reviewed the patient's current medications.     PAST SURGICAL HISTORY Past Surgical History  Procedure Laterality Date  . Replacement total hip w/  resurfacing implants      bilateral  . Colonoscopy      remote past at least 10 years ago  . Cardiac catheterization      no stent     FAMILY HISTORY: Family History  Problem Relation Age of Onset  . Colon cancer Neg Hx     SOCIAL HISTORY:  reports that he has quit smoking. He does not have any smokeless tobacco history on file. He reports that he drinks alcohol. He reports that he does not use illicit drugs.  PERFORMANCE STATUS: The patient's performance status is 2 - Symptomatic,  <50% confined to bed  PHYSICAL EXAM: Most Recent Vital Signs: Blood pressure 137/83, pulse 105, temperature 98.1 F (36.7 C), temperature source Oral, resp. rate 20, height 6' (1.829 m), weight 226 lb 3.1 oz (102.6 kg), SpO2 95.00%. General appearance: alert, cooperative, appears stated age, no distress and moderately obese Head: Normocephalic, without obvious abnormality, atraumatic Eyes: negative findings: lids and lashes normal, conjunctivae and sclerae normal and corneas clear Throat: lips, mucosa, and tongue normal; teeth and gums normal Neck: supple, symmetrical, trachea midline Lungs: wheezes bilaterally Heart: regular rate and rhythm Extremities: extremities normal, atraumatic, no cyanosis or edema, no edema, redness or tenderness in the calves or thighs and no petechial rash Skin: Skin color, texture, turgor normal. No rashes or lesions Neurologic: Grossly normal  LABORATORY DATA:  Results for orders placed during the hospital encounter of 12/01/13 (from the past 48 hour(s))  GLUCOSE, CAPILLARY     Status: Abnormal   Collection Time    12/01/13  4:52 PM      Result Value Ref Range   Glucose-Capillary 123 (*) 70 - 99 mg/dL   Comment 1 Notify RN    GLUCOSE, CAPILLARY     Status: Abnormal   Collection Time    12/01/13  8:22 PM      Result Value Ref Range   Glucose-Capillary 165 (*) 70 - 99 mg/dL   Comment 1 Documented in Chart     Comment 2 Notify  RN    TSH     Status: None   Collection Time    12/01/13  8:47 PM      Result Value Ref Range   TSH 3.130  0.350 - 4.500 uIU/mL   Comment: Performed at [place], CAPILLARY     Status: Abnormal   Collection Time    12/02/13 12:36 AM      Result Value Ref Range   Glucose-Capillary 203 (*) 70 - 99 mg/dL   Comment 1 Documented in Chart     Comment 2 Notify RN    GLUCOSE, CAPILLARY     Status: Abnormal   Collection Time    12/02/13  4:43 AM      Result Value Ref Range   Glucose-Capillary 142 (*) 70 - 99  mg/dL   Comment 1 Documented in Chart     Comment 2 Notify RN    PROTIME-INR     Status: Abnormal   Collection Time    12/02/13  5:48 AM      Result Value Ref Range   Prothrombin Time 37.1 (*) 11.6 - 15.2 seconds   INR 3.75 (*) 0.00 - 1.49  LIPASE, BLOOD     Status: Abnormal   Collection Time    12/02/13  5:48 AM      Result Value Ref Range   Lipase 152 (*) 11 - 59 U/L  CBC     Status: Abnormal   Collection Time    12/02/13  5:48 AM      Result Value Ref Range   WBC 10.8 (*) 4.0 - 10.5 K/uL   RBC 4.30  4.22 - 5.81 MIL/uL   Hemoglobin 13.1  13.0 - 17.0 g/dL   HCT 38.9 (*) 39.0 - 52.0 %   MCV 90.5  78.0 - 100.0 fL   MCH 30.5  26.0 - 34.0 pg   MCHC 33.7  30.0 - 36.0 g/dL   RDW 13.4  11.5 - 15.5 %   Platelets 126 (*) 150 - 400 K/uL  HEMOGLOBIN A1C     Status: Abnormal   Collection Time    12/02/13  5:48 AM      Result Value Ref Range   Hemoglobin A1C 6.7 (*) <5.7 %   Comment: (NOTE)                                                                               According to the ADA Clinical Practice Recommendations for 2011, when     HbA1c is used as a screening test:      >=6.5%   Diagnostic of Diabetes Mellitus               (if abnormal result is confirmed)     5.7-6.4%   Increased risk of developing Diabetes Mellitus     References:Diagnosis and Classification of Diabetes Mellitus,Diabetes     HIDU,3735,78(XBOER 1):S62-S69 and Standards of Medical Care in             Diabetes - 2011,Diabetes Care,2011,34 (Suppl 1):S11-S61.   Mean Plasma Glucose 146 (*) <117 mg/dL   Comment: Performed at [place]  Status: Abnormal   Collection Time    12/02/13  5:48 AM      Result Value Ref Range   Sodium 141  137 - 147 mEq/L   Potassium 3.5 (*) 3.7 - 5.3 mEq/L   Chloride 104  96 - 112 mEq/L   CO2 25  19 - 32 mEq/L   Glucose, Bld 132 (*) 70 - 99 mg/dL   BUN 23  6 - 23 mg/dL   Creatinine, Ser 1.24  0.50 - 1.35 mg/dL   Calcium 8.1 (*) 8.4  - 10.5 mg/dL   Total Protein 6.4  6.0 - 8.3 g/dL   Albumin 3.4 (*) 3.5 - 5.2 g/dL   AST 285 (*) 0 - 37 U/L   ALT 446 (*) 0 - 53 U/L   Alkaline Phosphatase 136 (*) 39 - 117 U/L   Total Bilirubin 4.0 (*) 0.3 - 1.2 mg/dL   GFR calc non Af Amer 54 (*) >90 mL/min   GFR calc Af Amer 62 (*) >90 mL/min   Comment: (NOTE)     The eGFR has been calculated using the CKD EPI equation.     This calculation has not been validated in all clinical situations.     eGFR's persistently <90 mL/min signify possible Chronic Kidney     Disease.   Anion gap 12  5 - 15  GLUCOSE, CAPILLARY     Status: Abnormal   Collection Time    12/02/13  8:11 AM      Result Value Ref Range   Glucose-Capillary 124 (*) 70 - 99 mg/dL   Comment 1 Notify RN    BILIRUBIN, DIRECT     Status: Abnormal   Collection Time    12/02/13  9:06 AM      Result Value Ref Range   Bilirubin, Direct 2.6 (*) 0.0 - 0.3 mg/dL  GLUCOSE, CAPILLARY     Status: Abnormal   Collection Time    12/02/13 11:49 AM      Result Value Ref Range   Glucose-Capillary 123 (*) 70 - 99 mg/dL   Comment 1 Notify RN    PRO B NATRIURETIC PEPTIDE     Status: Abnormal   Collection Time    12/02/13 12:12 PM      Result Value Ref Range   Pro B Natriuretic peptide (BNP) 3926.0 (*) 0 - 450 pg/mL  GLUCOSE, CAPILLARY     Status: Abnormal   Collection Time    12/02/13  5:21 PM      Result Value Ref Range   Glucose-Capillary 124 (*) 70 - 99 mg/dL   Comment 1 Notify RN    GLUCOSE, CAPILLARY     Status: Abnormal   Collection Time    12/02/13  9:04 PM      Result Value Ref Range   Glucose-Capillary 149 (*) 70 - 99 mg/dL   Comment 1 Documented in Chart     Comment 2 Notify RN    GLUCOSE, CAPILLARY     Status: Abnormal   Collection Time    12/03/13 12:13 AM      Result Value Ref Range   Glucose-Capillary 162 (*) 70 - 99 mg/dL   Comment 1 Documented in Chart     Comment 2 Notify RN    PROTIME-INR     Status: Abnormal   Collection Time    12/03/13  5:30 AM       Result Value Ref Range   Prothrombin Time 34.9 (*) 11.6 - 15.2 seconds  INR 3.47 (*) 0.00 - 1.49  HEPATIC FUNCTION PANEL     Status: Abnormal   Collection Time    12/03/13  5:30 AM      Result Value Ref Range   Total Protein 6.5  6.0 - 8.3 g/dL   Albumin 3.3 (*) 3.5 - 5.2 g/dL   AST 96 (*) 0 - 37 U/L   ALT 278 (*) 0 - 53 U/L   Alkaline Phosphatase 140 (*) 39 - 117 U/L   Total Bilirubin 3.3 (*) 0.3 - 1.2 mg/dL   Bilirubin, Direct 1.6 (*) 0.0 - 0.3 mg/dL   Indirect Bilirubin 1.7 (*) 0.3 - 0.9 mg/dL  BASIC METABOLIC PANEL     Status: Abnormal   Collection Time    12/03/13  5:30 AM      Result Value Ref Range   Sodium 138  137 - 147 mEq/L   Potassium 3.8  3.7 - 5.3 mEq/L   Chloride 100  96 - 112 mEq/L   CO2 24  19 - 32 mEq/L   Glucose, Bld 194 (*) 70 - 99 mg/dL   BUN 19  6 - 23 mg/dL   Creatinine, Ser 1.10  0.50 - 1.35 mg/dL   Calcium 8.2 (*) 8.4 - 10.5 mg/dL   GFR calc non Af Amer 62 (*) >90 mL/min   GFR calc Af Amer 72 (*) >90 mL/min   Comment: (NOTE)     The eGFR has been calculated using the CKD EPI equation.     This calculation has not been validated in all clinical situations.     eGFR's persistently <90 mL/min signify possible Chronic Kidney     Disease.   Anion gap 14  5 - 15  CBC     Status: Abnormal   Collection Time    12/03/13  5:30 AM      Result Value Ref Range   WBC 9.1  4.0 - 10.5 K/uL   RBC 4.20 (*) 4.22 - 5.81 MIL/uL   Hemoglobin 12.7 (*) 13.0 - 17.0 g/dL   HCT 38.1 (*) 39.0 - 52.0 %   MCV 90.7  78.0 - 100.0 fL   MCH 30.2  26.0 - 34.0 pg   MCHC 33.3  30.0 - 36.0 g/dL   RDW 13.4  11.5 - 15.5 %   Platelets 108 (*) 150 - 400 K/uL   Comment: SPECIMEN CHECKED FOR CLOTS     CONSISTENT WITH PREVIOUS RESULT  LACTIC ACID, PLASMA     Status: Abnormal   Collection Time    12/03/13  5:30 AM      Result Value Ref Range   Lactic Acid, Venous 2.8 (*) 0.5 - 2.2 mmol/L  GLUCOSE, CAPILLARY     Status: Abnormal   Collection Time    12/03/13  7:26 AM      Result  Value Ref Range   Glucose-Capillary 184 (*) 70 - 99 mg/dL   Comment 1 Notify RN    GLUCOSE, CAPILLARY     Status: Abnormal   Collection Time    12/03/13 11:11 AM      Result Value Ref Range   Glucose-Capillary 164 (*) 70 - 99 mg/dL   Comment 1 Notify RN        RADIOGRAPHY: Dg Chest Port 1 View  12/02/2013   CLINICAL DATA:  Wheezing  EXAM: PORTABLE CHEST - 1 VIEW  COMPARISON:  December 01, 2013  FINDINGS: There are calcified granulomas in the left mid lung. There is no edema  or consolidation. Heart is mildly enlarged with pulmonary vascularity within normal limits. No adenopathy. No bone lesions.  IMPRESSION: No edema or consolidation. Calcified granulomas in left mid lung. Mild cardiac enlargement is stable.   Electronically Signed   By: Lowella Grip M.D.   On: 12/02/2013 13:25       PATHOLOGY:  None  ASSESSMENT:  1. Thrombocytopenia, new.  No record of past episodes of thrombocytopenia in [facility].  Suspect low-grade DIC secondary to pancreatitis and probable hypotensive episode that occurred on 8/19 at 0430 and 2201 hours. 2. Acute biliary pancreatitis with CT evidence of common bile duct stone, Gen Surgery preparing for laparoscopic cholecystectomy. 3. Transaminitis and hyperbilirubinemia, secondary to #1. 4. Coagulopathy, secondary to warfarin. 5. Chronic atrial fibrillation, on chronic Coumadin therapy. 6. Stage III chronic kidney disease.  Patient Active Problem List   Diagnosis Date Noted  . Thrombocytopenia, unspecified 12/03/2013  . Wheezing 12/02/2013  . Obesity, morbid 12/02/2013  . Transaminitis 12/02/2013  . Type 2 diabetes mellitus with diabetic nephropathy 12/01/2013  . Hypertension 12/01/2013  . Atrial fibrillation 12/01/2013  . Choledocholithiasis 12/01/2013  . Pancreatitis due to biliary obstruction 12/01/2013  . Chronic kidney disease (CKD), stage III (moderate) 12/01/2013  . Hypothyroidism 12/01/2013  . Cholelithiasis 12/01/2013    PLAN:  1. I personally  reviewed and went over laboratory results with the patient.  The results are noted within this dictation. 2. I personally reviewed and went over radiographic studies with the patient.  The results are noted within this dictation.   3. Chart reviewed 4. Labs ordered: DIC panel (STAT), anemia panel, ANA, anticardiolipin antibodies, acute hepatitis panel, HIV antibodies, platelet antibodies 5. Recommend maintaining BP to avoid episodes of hypotension with fluid rescucitation. 6. Suspect low grade DIC given elevation of INR despite stopping Coumadin. 7. DIC panel every 12 hours. 8. Recommend transfusion of platelets for a platelet count less than or equal to 20,000 9. Will follow along as an inpatient.  All questions were answered. The patient knows to call the clinic with any problems, questions or concerns. We can certainly see the patient much sooner if necessary.  Patient and plan discussed with Dr. Farrel Gobble and he is in agreement with the aforementioned.   KEFALAS,THOMAS  12/03/2013

## 2013-12-03 NOTE — Progress Notes (Signed)
Subjective: States overnight had difficulty breathing. Now on Venti mask. No N/V. Feels improved froma  Respiratory standpoint than overnight. No abdominal pain, N/V. No GI complaints.   Objective: Vital signs in last 24 hours: Temp:  [98.1 F (36.7 C)-98.6 F (37 C)] 98.1 F (36.7 C) (08/21 0535) Pulse Rate:  [80-105] 105 (08/21 0535) Resp:  [20] 20 (08/21 0535) BP: (133-137)/(67-83) 137/83 mmHg (08/21 0535) SpO2:  [94 %-98 %] 95 % (08/21 0706) FiO2 (%):  [40 %-55 %] 40 % (08/21 0706) Last BM Date: 12/02/13 General:   Alert and oriented, pleasant Abdomen:  Bowel sounds present, soft, non-tender,  Extremities:  Without edema. Neurologic:  Alert and  oriented x4;  grossly normal neurologically. Psych:  Alert and cooperative. Normal mood and affect.  Intake/Output from previous day: 08/20 0701 - 08/21 0700 In: 1797.1 [P.O.:480; I.V.:1317.1] Out: -  Intake/Output this shift:    Lab Results:  Recent Labs  12/01/13 0624 12/02/13 0548 12/03/13 0530  WBC 12.9* 10.8* 9.1  HGB 13.4 13.1 12.7*  HCT 38.4* 38.9* 38.1*  PLT 153 126* 108*   BMET  Recent Labs  12/01/13 0624 12/02/13 0548 12/03/13 0530  NA 139 141 138  K 3.8 3.5* 3.8  CL 101 104 100  CO2 28 25 24   GLUCOSE 168* 132* 194*  BUN 23 23 19   CREATININE 1.36* 1.24 1.10  CALCIUM 8.6 8.1* 8.2*   LFT  Recent Labs  12/01/13 0624 12/02/13 0548 12/02/13 0906 12/03/13 0530  PROT 6.4 6.4  --  6.5  ALBUMIN 3.7 3.4*  --  3.3*  AST 592* 285*  --  96*  ALT 365* 446*  --  278*  ALKPHOS 134* 136*  --  140*  BILITOT 2.5* 4.0*  --  3.3*  BILIDIR  --   --  2.6* 1.6*  IBILI  --   --   --  1.7*   PT/INR  Recent Labs  12/02/13 0548 12/03/13 0530  LABPROT 37.1* 34.9*  INR 3.75* 3.47*     Studies/Results: Mr 3d Recon At Scanner  12/01/2013   CLINICAL DATA:  Abdominal pain. Gallstones with choledocholithiasis on CT. Diabetes.  EXAM: MRI ABDOMEN WITHOUT AND WITH CONTRAST (INCLUDING MRCP)  TECHNIQUE:  Multiplanar multisequence MR imaging of the abdomen was performed both before and after the administration of intravenous contrast. Heavily T2-weighted images of the biliary and pancreatic ducts were obtained, and three-dimensional MRCP images were rendered by post processing.  CONTRAST:  35mL MULTIHANCE GADOBENATE DIMEGLUMINE 529 MG/ML IV SOLN  COMPARISON:  12/01/2013  FINDINGS: Despite efforts by the technologist and patient, motion artifact is present on today's exam and could not be eliminated. This reduces exam sensitivity and specificity. The 1.8 x 1.2 cm nonenhancing T2 hyperintense structure in the right hepatic lobe, image 16 of series 5, compatible with cyst.  There are a number of filling defects in the neck of the gallbladder compatible with gallstones. A periampullary duodenal diverticulum is present.  On image 23 of series 3 there is a 4 mm apparent filling defect in the CBD near the cystic duct attachment, but on multiplanar assessment I think that this probably represents a crossing vessel causing volume averaging rather than a true stone. The previously seen distal CBD stone on CT scan is no longer well appreciated. On images 24-25 of series 3 the possibility of a fluid level in the CBD is raised, potentially from sludge. CBD caliber 7 mm, previously 10 mm. Gallbladder wall mildly thickened at 4  mm.  No abnormal pancreatic enhancement. Adrenal glands and spleen appear normal. No enhancing liver mass.  IMPRESSION: 1. I do not see the distal CBD calculi on today' s MRCP, with the understanding that there is extensive breathing motion artifact which reduces diagnostic sensitivity and specificity. CBD caliber is 7 mm, within normal limits for age. It is possible that the prior stones have passed. 2. There gallstones in the neck of the gallbladder. Crossing vessel causes a false filling defect in the CBD near the cystic duct attachment. 3. Periampullary duodenal diverticulum. 4. Mild gallbladder wall  thickening.   Electronically Signed   By: Sherryl Barters M.D.   On: 12/01/2013 14:51   Dg Chest Port 1 View  12/02/2013   CLINICAL DATA:  Wheezing  EXAM: PORTABLE CHEST - 1 VIEW  COMPARISON:  December 01, 2013  FINDINGS: There are calcified granulomas in the left mid lung. There is no edema or consolidation. Heart is mildly enlarged with pulmonary vascularity within normal limits. No adenopathy. No bone lesions.  IMPRESSION: No edema or consolidation. Calcified granulomas in left mid lung. Mild cardiac enlargement is stable.   Electronically Signed   By: Lowella Grip M.D.   On: 12/02/2013 13:25   Mr Abd W/wo Cm/mrcp  12/01/2013   CLINICAL DATA:  Abdominal pain. Gallstones with choledocholithiasis on CT. Diabetes.  EXAM: MRI ABDOMEN WITHOUT AND WITH CONTRAST (INCLUDING MRCP)  TECHNIQUE: Multiplanar multisequence MR imaging of the abdomen was performed both before and after the administration of intravenous contrast. Heavily T2-weighted images of the biliary and pancreatic ducts were obtained, and three-dimensional MRCP images were rendered by post processing.  CONTRAST:  27mL MULTIHANCE GADOBENATE DIMEGLUMINE 529 MG/ML IV SOLN  COMPARISON:  12/01/2013  FINDINGS: Despite efforts by the technologist and patient, motion artifact is present on today's exam and could not be eliminated. This reduces exam sensitivity and specificity. The 1.8 x 1.2 cm nonenhancing T2 hyperintense structure in the right hepatic lobe, image 16 of series 5, compatible with cyst.  There are a number of filling defects in the neck of the gallbladder compatible with gallstones. A periampullary duodenal diverticulum is present.  On image 23 of series 3 there is a 4 mm apparent filling defect in the CBD near the cystic duct attachment, but on multiplanar assessment I think that this probably represents a crossing vessel causing volume averaging rather than a true stone. The previously seen distal CBD stone on CT scan is no longer well  appreciated. On images 24-25 of series 3 the possibility of a fluid level in the CBD is raised, potentially from sludge. CBD caliber 7 mm, previously 10 mm. Gallbladder wall mildly thickened at 4 mm.  No abnormal pancreatic enhancement. Adrenal glands and spleen appear normal. No enhancing liver mass.  IMPRESSION: 1. I do not see the distal CBD calculi on today' s MRCP, with the understanding that there is extensive breathing motion artifact which reduces diagnostic sensitivity and specificity. CBD caliber is 7 mm, within normal limits for age. It is possible that the prior stones have passed. 2. There gallstones in the neck of the gallbladder. Crossing vessel causes a false filling defect in the CBD near the cystic duct attachment. 3. Periampullary duodenal diverticulum. 4. Mild gallbladder wall thickening.   Electronically Signed   By: Sherryl Barters M.D.   On: 12/01/2013 14:51    Assessment/Plan: 80 year old male admitted with biliary pancreatitis, CT findings of likely CBD stone. Significant improvement in abdominal pain since admission noted. Course complicated  by anticoagulation, as patient is on Coumadin daily for afib. MRCP showed 25mm filling defect in CBD near cystic duct attachment but ?of crossing vessel rather than true stone. Previously seen distal CBD stone on CT no longer appreciated. ?fluid level in CBD. GB wall mildly thickened. Exam somewhat limited by motion artifact.   Clinically, he has improved from a GI standpoint and LFTs improving. Likely passed a common duct stone. As LFTs improving, hold off on ERCP. Continue to hold coumadin. Needs cholecystectomy prior to discharge. No ERCP indicated at this time. Will continue to follow with you.  Orvil Feil, ANP-BC [facility]     LOS: 2 days    12/03/2013, 8:06 AM

## 2013-12-03 NOTE — Progress Notes (Signed)
Subjective: Events of last night noted. Denies any abdominal pain at this time.  Objective: Vital signs in last 24 hours: Temp:  [98.1 F (36.7 C)-98.6 F (37 C)] 98.1 F (36.7 C) (08/21 0535) Pulse Rate:  [80-105] 105 (08/21 0535) Resp:  [20] 20 (08/21 0535) BP: (133-137)/(67-83) 137/83 mmHg (08/21 0535) SpO2:  [94 %-98 %] 95 % (08/21 0706) FiO2 (%):  [40 %-55 %] 40 % (08/21 0706) Last BM Date: 12/03/13  Intake/Output from previous day: 08/20 0701 - 08/21 0700 In: 1797.1 [P.O.:480; I.V.:1317.1] Out: -  Intake/Output this shift: Total I/O In: 120 [P.O.:120] Out: -   General appearance: no distress GI: soft, non-tender; bowel sounds normal; no masses,  no organomegaly and Somewhat distended, but I believe this is his norm.  Lab Results:   Recent Labs  12/02/13 0548 12/03/13 0530  WBC 10.8* 9.1  HGB 13.1 12.7*  HCT 38.9* 38.1*  PLT 126* 108*   BMET  Recent Labs  12/02/13 0548 12/03/13 0530  NA 141 138  K 3.5* 3.8  CL 104 100  CO2 25 24  GLUCOSE 132* 194*  BUN 23 19  CREATININE 1.24 1.10  CALCIUM 8.1* 8.2*   PT/INR  Recent Labs  12/02/13 0548 12/03/13 0530  LABPROT 37.1* 34.9*  INR 3.75* 3.47*    Studies/Results: Mr 3d Recon At Scanner  12/01/2013   CLINICAL DATA:  Abdominal pain. Gallstones with choledocholithiasis on CT. Diabetes.  EXAM: MRI ABDOMEN WITHOUT AND WITH CONTRAST (INCLUDING MRCP)  TECHNIQUE: Multiplanar multisequence MR imaging of the abdomen was performed both before and after the administration of intravenous contrast. Heavily T2-weighted images of the biliary and pancreatic ducts were obtained, and three-dimensional MRCP images were rendered by post processing.  CONTRAST:  39mL MULTIHANCE GADOBENATE DIMEGLUMINE 529 MG/ML IV SOLN  COMPARISON:  12/01/2013  FINDINGS: Despite efforts by the technologist and patient, motion artifact is present on today's exam and could not be eliminated. This reduces exam sensitivity and specificity. The  1.8 x 1.2 cm nonenhancing T2 hyperintense structure in the right hepatic lobe, image 16 of series 5, compatible with cyst.  There are a number of filling defects in the neck of the gallbladder compatible with gallstones. A periampullary duodenal diverticulum is present.  On image 23 of series 3 there is a 4 mm apparent filling defect in the CBD near the cystic duct attachment, but on multiplanar assessment I think that this probably represents a crossing vessel causing volume averaging rather than a true stone. The previously seen distal CBD stone on CT scan is no longer well appreciated. On images 24-25 of series 3 the possibility of a fluid level in the CBD is raised, potentially from sludge. CBD caliber 7 mm, previously 10 mm. Gallbladder wall mildly thickened at 4 mm.  No abnormal pancreatic enhancement. Adrenal glands and spleen appear normal. No enhancing liver mass.  IMPRESSION: 1. I do not see the distal CBD calculi on today' s MRCP, with the understanding that there is extensive breathing motion artifact which reduces diagnostic sensitivity and specificity. CBD caliber is 7 mm, within normal limits for age. It is possible that the prior stones have passed. 2. There gallstones in the neck of the gallbladder. Crossing vessel causes a false filling defect in the CBD near the cystic duct attachment. 3. Periampullary duodenal diverticulum. 4. Mild gallbladder wall thickening.   Electronically Signed   By: Sherryl Barters M.D.   On: 12/01/2013 14:51   Dg Chest Port 1 View  12/02/2013  CLINICAL DATA:  Wheezing  EXAM: PORTABLE CHEST - 1 VIEW  COMPARISON:  December 01, 2013  FINDINGS: There are calcified granulomas in the left mid lung. There is no edema or consolidation. Heart is mildly enlarged with pulmonary vascularity within normal limits. No adenopathy. No bone lesions.  IMPRESSION: No edema or consolidation. Calcified granulomas in left mid lung. Mild cardiac enlargement is stable.   Electronically Signed    By: Lowella Grip M.D.   On: 12/02/2013 13:25   Mr Abd W/wo Cm/mrcp  12/01/2013   CLINICAL DATA:  Abdominal pain. Gallstones with choledocholithiasis on CT. Diabetes.  EXAM: MRI ABDOMEN WITHOUT AND WITH CONTRAST (INCLUDING MRCP)  TECHNIQUE: Multiplanar multisequence MR imaging of the abdomen was performed both before and after the administration of intravenous contrast. Heavily T2-weighted images of the biliary and pancreatic ducts were obtained, and three-dimensional MRCP images were rendered by post processing.  CONTRAST:  64mL MULTIHANCE GADOBENATE DIMEGLUMINE 529 MG/ML IV SOLN  COMPARISON:  12/01/2013  FINDINGS: Despite efforts by the technologist and patient, motion artifact is present on today's exam and could not be eliminated. This reduces exam sensitivity and specificity. The 1.8 x 1.2 cm nonenhancing T2 hyperintense structure in the right hepatic lobe, image 16 of series 5, compatible with cyst.  There are a number of filling defects in the neck of the gallbladder compatible with gallstones. A periampullary duodenal diverticulum is present.  On image 23 of series 3 there is a 4 mm apparent filling defect in the CBD near the cystic duct attachment, but on multiplanar assessment I think that this probably represents a crossing vessel causing volume averaging rather than a true stone. The previously seen distal CBD stone on CT scan is no longer well appreciated. On images 24-25 of series 3 the possibility of a fluid level in the CBD is raised, potentially from sludge. CBD caliber 7 mm, previously 10 mm. Gallbladder wall mildly thickened at 4 mm.  No abnormal pancreatic enhancement. Adrenal glands and spleen appear normal. No enhancing liver mass.  IMPRESSION: 1. I do not see the distal CBD calculi on today' s MRCP, with the understanding that there is extensive breathing motion artifact which reduces diagnostic sensitivity and specificity. CBD caliber is 7 mm, within normal limits for age. It is  possible that the prior stones have passed. 2. There gallstones in the neck of the gallbladder. Crossing vessel causes a false filling defect in the CBD near the cystic duct attachment. 3. Periampullary duodenal diverticulum. 4. Mild gallbladder wall thickening.   Electronically Signed   By: Sherryl Barters M.D.   On: 12/01/2013 14:51    Anti-infectives: Anti-infectives   None      Assessment/Plan: Impression: Gallstone pancreatitis, somewhat improving. Agree with no need for ERCP at this time. Liver tests are improving. He is still over anticoagulated. 2-D echo results noted. His BNP is still significantly elevated. He needs to be medically cleared prior to surgical intervention.  LOS: 2 days    Kennesha Brewbaker A 12/03/2013

## 2013-12-03 NOTE — Progress Notes (Signed)
Per discussion with MD, Pt encouraged to change from mask to nasal canula.  Pt agreed.  Pt still has audible wheezing but this was present on admission.

## 2013-12-03 NOTE — Progress Notes (Signed)
Pt having increased wheezing and shortness of breath. MD paged and new orders given to decrease IV fluids and give IV Lasix.

## 2013-12-04 DIAGNOSIS — D65 Disseminated intravascular coagulation [defibrination syndrome]: Secondary | ICD-10-CM | POA: Diagnosis not present

## 2013-12-04 LAB — IRON AND TIBC
Iron: 10 ug/dL — ABNORMAL LOW (ref 42–135)
SATURATION RATIOS: 5 % — AB (ref 20–55)
TIBC: 195 ug/dL — AB (ref 215–435)
UIBC: 185 ug/dL (ref 125–400)

## 2013-12-04 LAB — HEPATITIS PANEL, ACUTE
HCV AB: NEGATIVE
HEP A IGM: NONREACTIVE
HEP B S AG: NEGATIVE
Hep B C IgM: NONREACTIVE

## 2013-12-04 LAB — DIC (DISSEMINATED INTRAVASCULAR COAGULATION) PANEL
APTT: 63 s — AB (ref 24–37)
APTT: 65 s — AB (ref 24–37)
D DIMER QUANT: 1.77 ug{FEU}/mL — AB (ref 0.00–0.48)
D DIMER QUANT: 1.81 ug{FEU}/mL — AB (ref 0.00–0.48)
FIBRINOGEN: 583 mg/dL — AB (ref 204–475)
FIBRINOGEN: 612 mg/dL — AB (ref 204–475)
INR: 3 — AB (ref 0.00–1.49)
INR: 3.19 — AB (ref 0.00–1.49)
PLATELETS: 98 10*3/uL — AB (ref 150–400)
PROTHROMBIN TIME: 31.1 s — AB (ref 11.6–15.2)
SMEAR REVIEW: NONE SEEN

## 2013-12-04 LAB — COMPREHENSIVE METABOLIC PANEL
ALK PHOS: 130 U/L — AB (ref 39–117)
ALT: 177 U/L — AB (ref 0–53)
AST: 39 U/L — AB (ref 0–37)
Albumin: 2.9 g/dL — ABNORMAL LOW (ref 3.5–5.2)
Anion gap: 11 (ref 5–15)
BUN: 14 mg/dL (ref 6–23)
CO2: 26 meq/L (ref 19–32)
Calcium: 8.1 mg/dL — ABNORMAL LOW (ref 8.4–10.5)
Chloride: 99 mEq/L (ref 96–112)
Creatinine, Ser: 0.78 mg/dL (ref 0.50–1.35)
GFR, EST NON AFRICAN AMERICAN: 84 mL/min — AB (ref >90)
GLUCOSE: 197 mg/dL — AB (ref 70–99)
POTASSIUM: 4 meq/L (ref 3.7–5.3)
Sodium: 136 mEq/L — ABNORMAL LOW (ref 137–147)
Total Bilirubin: 2 mg/dL — ABNORMAL HIGH (ref 0.3–1.2)
Total Protein: 6.1 g/dL (ref 6.0–8.3)

## 2013-12-04 LAB — CBC
HCT: 36.3 % — ABNORMAL LOW (ref 39.0–52.0)
HEMOGLOBIN: 12.6 g/dL — AB (ref 13.0–17.0)
MCH: 30.7 pg (ref 26.0–34.0)
MCHC: 34.7 g/dL (ref 30.0–36.0)
MCV: 88.3 fL (ref 78.0–100.0)
PLATELETS: 113 10*3/uL — AB (ref 150–400)
RBC: 4.11 MIL/uL — ABNORMAL LOW (ref 4.22–5.81)
RDW: 12.9 % (ref 11.5–15.5)
WBC: 5.6 10*3/uL (ref 4.0–10.5)

## 2013-12-04 LAB — GLUCOSE, CAPILLARY
GLUCOSE-CAPILLARY: 188 mg/dL — AB (ref 70–99)
Glucose-Capillary: 191 mg/dL — ABNORMAL HIGH (ref 70–99)
Glucose-Capillary: 208 mg/dL — ABNORMAL HIGH (ref 70–99)
Glucose-Capillary: 129 mg/dL — ABNORMAL HIGH (ref 70–99)

## 2013-12-04 LAB — LIPASE, BLOOD: LIPASE: 27 U/L (ref 11–59)

## 2013-12-04 LAB — FERRITIN: Ferritin: 202 ng/mL (ref 22–322)

## 2013-12-04 LAB — HIV ANTIBODY (ROUTINE TESTING W REFLEX): HIV 1&2 Ab, 4th Generation: NONREACTIVE

## 2013-12-04 LAB — DIC (DISSEMINATED INTRAVASCULAR COAGULATION)PANEL
Platelets: 118 10*3/uL — ABNORMAL LOW (ref 150–400)
Prothrombin Time: 32.7 seconds — ABNORMAL HIGH (ref 11.6–15.2)
Smear Review: NONE SEEN

## 2013-12-04 LAB — PROTIME-INR
INR: 3.11 — ABNORMAL HIGH (ref 0.00–1.49)
Prothrombin Time: 32 seconds — ABNORMAL HIGH (ref 11.6–15.2)

## 2013-12-04 LAB — FOLATE: FOLATE: 8.7 ng/mL

## 2013-12-04 MED ORDER — POLYETHYLENE GLYCOL 3350 17 G PO PACK
17.0000 g | PACK | Freq: Every day | ORAL | Status: DC
  Administered 2013-12-04 – 2013-12-11 (×6): 17 g via ORAL
  Filled 2013-12-04 (×7): qty 1

## 2013-12-04 MED ORDER — DIGOXIN 0.25 MG/ML IJ SOLN
0.2500 mg | Freq: Once | INTRAMUSCULAR | Status: AC
  Administered 2013-12-04: 0.25 mg via INTRAVENOUS
  Filled 2013-12-04: qty 2

## 2013-12-04 MED ORDER — PREDNISONE 20 MG PO TABS
30.0000 mg | ORAL_TABLET | Freq: Every day | ORAL | Status: DC
  Administered 2013-12-04: 30 mg via ORAL
  Filled 2013-12-04 (×2): qty 1

## 2013-12-04 NOTE — Progress Notes (Signed)
[facility] PROGRESS NOTE  Aleister Lady HUT:[id number] DOB: Sep 18, 1932 DOA: 12/01/2013 PCP: [name], MD    Code Status: Full code Family Communication: Discussed with the patient; family not available. Disposition Plan: Discharge when clinically appropriate.   Consultants:  Gastroenterology  General surgery  Procedures: 2-D echocardiogram 8/20:   Impressions: - Moderate LVH with LVEF 55-60%, cannot exclude basal inferolateral hypokinesis. Indeterminate diastolic function. Upper normal left atrial chamber size. MAC with trivial mitral regurgitation Mildly ectatic aortic root. Mildly sclerotic aortic valve with mild aortic regurgitation. Moderate RV chamber dilatation with normal contraction. Elevated PASP 56 mm mercury with increased estimated CVP.   MRCP abdomen 12/01/13  CT abdomen 12/01/13  Antibiotics:  Levaquin pending 12/03/13  HPI/Subjective: The patient was developed rapid ventricular rate yesterday afternoon. He was transferred to the step down unit for diltiazem drip and further monitoring. He has no complaints of chest pain or shortness of breath. He says that his wheezing has decreased. He denies abdominal pain or nausea.  Objective: Filed Vitals:   12/04/13 1000  BP: 133/94  Pulse: 104  Temp:   Resp: 20   temperature 97.7. oxygen saturation 93 percent on supplemental oxygen.  Intake/Output Summary (Last 24 hours) at 12/04/13 1036 Last data filed at 12/04/13 1014  Gross per 24 hour  Intake 2278.17 ml  Output    800 ml  Net 1478.17 ml   Filed Weights   12/01/13 0048 12/01/13 0800 12/04/13 0447  Weight: 99.791 kg (220 lb) 102.6 kg (226 lb 3.1 oz) 107.5 kg (236 lb 15.9 oz)    Exam:   General:  Morbidly obese 80 year old laying in bed, asleep, in no acute distress.  Cardiovascular: Irregular, irregular with tachycardia. No pedal edema.  Respiratory: Near resolution of upper airway wheezes; lungs clear otherwise. Breathing is  nonlabored.  Abdomen: Obese abdomen, positive bowel sounds, soft, nontender nondistended.  Musculoskeletal: No acute hot red joints.   Neurologic: He is alert when aroused and is oriented x3. Cranial nerves II through XII are grossly intact.  Data Reviewed: Basic Metabolic Panel:  Recent Labs Lab 12/01/13 0108 12/01/13 0624 12/02/13 0548 12/03/13 0530 12/04/13 0517  NA 141 139 141 138 136*  K 3.6* 3.8 3.5* 3.8 4.0  CL 98 101 104 100 99  CO2 29 28 25 24 26   GLUCOSE 203* 168* 132* 194* 197*  BUN 23 23 23 19 14   CREATININE 1.51* 1.36* 1.24 1.10 0.78  CALCIUM 9.4 8.6 8.1* 8.2* 8.1*   Liver Function Tests:  Recent Labs Lab 12/01/13 0108 12/01/13 0624 12/02/13 0548 12/03/13 0530 12/04/13 0517  AST 83* 592* 285* 96* 39*  ALT 45 365* 446* 278* 177*  ALKPHOS 101 134* 136* 140* 130*  BILITOT 1.3* 2.5* 4.0* 3.3* 2.0*  PROT 7.5 6.4 6.4 6.5 6.1  ALBUMIN 4.3 3.7 3.4* 3.3* 2.9*    Recent Labs Lab 12/01/13 0108 12/01/13 0624 12/02/13 0548 12/04/13 0517  LIPASE 939* 582* 152* 27   No results found for this basename: AMMONIA,  in the last 168 hours CBC:  Recent Labs Lab 12/01/13 0108 12/01/13 0624 12/02/13 0548 12/03/13 0530 12/03/13 1521 12/04/13 0042 12/04/13 0517  WBC 13.8* 12.9* 10.8* 9.1  --   --  5.6  NEUTROABS 10.3*  --   --   --   --   --   --   HGB 14.5 13.4 13.1 12.7*  --   --  12.6*  HCT 41.7 38.4* 38.9* 38.1*  --   --  36.3*  MCV  88.3 89.3 90.5 90.7  --   --  88.3  PLT 204 153 126* 108* 98* 98* 113*   Cardiac Enzymes: No results found for this basename: CKTOTAL, CKMB, CKMBINDEX, TROPONINI,  in the last 168 hours BNP (last 3 results)  Recent Labs  12/02/13 1212  PROBNP 3926.0*   CBG:  Recent Labs Lab 12/03/13 0726 12/03/13 1111 12/03/13 1632 12/03/13 2156 12/04/13 0748  GLUCAP 184* 164* 141* 187* 191*    Recent Results (from the past 240 hour(s))  MRSA PCR SCREENING     Status: None   Collection Time    12/03/13  6:55 PM       Result Value Ref Range Status   MRSA by PCR NEGATIVE  NEGATIVE Final   Comment:            The GeneXpert MRSA Assay (FDA     approved for NASAL specimens     only), is one component of a     comprehensive MRSA colonization     surveillance program. It is not     intended to diagnose MRSA     infection nor to guide or     monitor treatment for     MRSA infections.     Studies: Dg Chest Port 1 View  12/02/2013   CLINICAL DATA:  Wheezing  EXAM: PORTABLE CHEST - 1 VIEW  COMPARISON:  December 01, 2013  FINDINGS: There are calcified granulomas in the left mid lung. There is no edema or consolidation. Heart is mildly enlarged with pulmonary vascularity within normal limits. No adenopathy. No bone lesions.  IMPRESSION: No edema or consolidation. Calcified granulomas in left mid lung. Mild cardiac enlargement is stable.   Electronically Signed   By: Lowella Grip M.D.   On: 12/02/2013 13:25    Scheduled Meds: . atorvastatin  20 mg Oral q1800  . diltiazem  240 mg Oral Daily  . diltiazem  10 mg Intravenous Once  . doxazosin  2 mg Oral QHS  . insulin aspart  0-15 Units Subcutaneous TID WC  . insulin aspart  0-5 Units Subcutaneous QHS  . levofloxacin (LEVAQUIN) IV  500 mg Intravenous Q24H  . levothyroxine  50 mcg Oral QAC breakfast  . lisinopril  20 mg Oral Daily  . metoprolol succinate  25 mg Oral Daily  . polyethylene glycol  17 g Oral Daily  . predniSONE  30 mg Oral Q supper  . senna-docusate  1 tablet Oral QHS  . sodium chloride  500 mL Intravenous Once  . sodium chloride  3 mL Intravenous Q12H   Continuous Infusions: . 0.9 % NaCl with KCl 20 mEq / L 75 mL/hr at 12/04/13 1014  . diltiazem (CARDIZEM) infusion 20 mg/hr (12/04/13 1004)   Assessment and plan: Principal Problem:   Pancreatitis due to biliary obstruction Active Problems:   Choledocholithiasis   Cholelithiasis   Transaminitis   Thrombocytopenia, unspecified   Type 2 diabetes mellitus with diabetic nephropathy    Hypertension   Atrial fibrillation   Chronic kidney disease (CKD), stage III (moderate)   Hypothyroidism   Wheezing   Obesity, morbid   Pulmonary hypertension   DIC (disseminated intravascular coagulation)    1. Acute biliary pancreatitis with CT evidence of common bile duct stone on admission/cholelithiasis/choledocholithiasis. CT on admission revealed a common bile duct stone. GI was consulted and ordered MRCP on 8/19. It did not reveal a common bile duct stone, but it did reveal gallstones in the neck of the bladder; possible  passage of distal CBD stone. The patient's lipase, total bilirubin, and liver transaminases have improved-his ALT and lipase are now within normal limits.  ERCP was considered, but is being held off for now per GI. He will need a laparoscopic cholecystectomy and [name] plans to do it early next week. Will await the decrease in his INR and will continue to hold Coumadin. The patient is symptomatically improved and his diet was advanced to full liquids by GI. Transaminitis and hyperbilirubinemia, secondary to #1. The patient's liver transaminases and total bilirubin are now trending downward. Continue to monitor. Chronic atrial fibrillation, with RVR. His rapid rate is likely exacerbated by his acute illness. Possible contribution from [place]. Xopenex has been changed to when necessary. His 2-D echocardiogram revealed ejection fraction of 55-60%. He was transferred to the step down unit on the diltiazem drip. We will continue this in addition to continuing his oral metoprolol and oral diltiazem. He was given digoxin yesterday and again this morning. Will increase his IV fluids a little after they were decreased. He may need a dose of amiodarone. We'll discuss with on-call cardiologist. Coumadin is on hold because of coagulopathy and pending surgery. Coagulopathy, secondary to warfarin. His INR had increased to greater than 3. No signs of bleeding. Coumadin is  on hold. We'll consider vitamin K or FFP if needed to expedite treatment. Thrombocytopenia. Etiology possibly secondary to low-grade DIC per hematology who was consulted. Workup reveals DIC panel with no schistocytes; TSH is within normal limits. Hepatitis panel so far, nonreactive. Vitamin B12 level within normal limits. HIV antibody negative. Cardiolipin antibodies and ANA are pending.. His platelet count was 204 on admission and has decreased daily. I do not believe he was given heparin as he is already anticoagulated. His spleen on CT scan was apparently "normal "per radiology. He was started on Protonix on admission which can cause thrombocytopenia, so it was discontinued. Currently, there are no signs of bleeding. Pulmonary hypertension, per 2-D echocardiogram. Echo revealed peak pulmonary artery pressure of 56 mm of mercury. He does not appear to have cor pulmonale. Continue to monitor. Hypertension. Currently stable and controlled on lisinopril, diltiazem, and lisinopril. HCTZ is on hold. Diabetes mellitus with diabetic nephropathy. Metformin is on hold given the recent IV contrast. We'll continue sliding scale NovoLog. His hemoglobin A1c was 6.7. Stage III chronic kidney disease. His creatinine has improved from baseline. Continue to monitor. Hypothyroidism. Currently stable on Synthroid. His TSH is within normal limits at 3.1. Wheezes, possibly upper airway reactive airways or acute bronchitis. His bronchospasms have decreased with the start of small dosing of prednisone, Xopenex, guaifenesin and added Levaquin. Xopenex changed to when necessary because of the rapid ventricular rate. The patient was given Lasix overnight on 8/21 and his IV fluids were decreased for increase in wheezing, but the patient has had upper airway wheezes since admission. According to the history, this appears to be chronic. His chest x-ray revealed no pulmonary edema and his 2-D echocardiogram revealed preserved  left ventricle systolic function. I do not believe he has congestive heart failure. Most of his wheezes are upper airway and the patient may be an ENT evaluation as an outpatient.    Time spent: 35 minutes    [facility] Pager [phone]. If 7PM-7AM, please contact night-coverage at [email or web address], password [facility] 12/04/2013, 10:36 AM  LOS: 3 days

## 2013-12-04 NOTE — Progress Notes (Signed)
Pt is having frequent pauses in his HR. Rate is in the 100s-120s at this time, BP stable. Pt is asymptomatic and has no complaints at this time. MD aware about frequent pauses and asked that we take pt off of diltiazem drip. Will titrate pt down until he is off the diltiazem drip. Will continue to monitor.

## 2013-12-04 NOTE — Progress Notes (Signed)
  Subjective: Denies any abdominal pain.  Objective: Vital signs in last 24 hours: Temp:  [97.7 F (36.5 C)-98.4 F (36.9 C)] 97.7 F (36.5 C) (08/22 0429) Pulse Rate:  [61-160] 156 (08/22 1030) Resp:  [15-35] 26 (08/22 1030) BP: (100-155)/(66-117) 155/92 mmHg (08/22 1030) SpO2:  [90 %-97 %] 93 % (08/22 1030) Weight:  [107.5 kg (236 lb 15.9 oz)] 107.5 kg (236 lb 15.9 oz) (08/22 0447) Last BM Date: 12/03/13  Intake/Output from previous day: 08/21 0701 - 08/22 0700 In: 2190.5 [P.O.:480; I.V.:1710.5] Out: 600 [Urine:600] Intake/Output this shift: Total I/O In: 207.7 [I.V.:207.7] Out: 200 [Urine:200]  General appearance: alert, cooperative and no distress GI: soft, non-tender; bowel sounds normal; no masses,  no organomegaly  Lab Results:   Recent Labs  12/03/13 0530  12/04/13 0042 12/04/13 0517  WBC 9.1  --   --  5.6  HGB 12.7*  --   --  12.6*  HCT 38.1*  --   --  36.3*  PLT 108*  < > 98* 113*  < > = values in this interval not displayed. BMET  Recent Labs  12/03/13 0530 12/04/13 0517  NA 138 136*  K 3.8 4.0  CL 100 99  CO2 24 26  GLUCOSE 194* 197*  BUN 19 14  CREATININE 1.10 0.78  CALCIUM 8.2* 8.1*   PT/INR  Recent Labs  12/04/13 0042 12/04/13 0517  LABPROT 32.7* 32.0*  INR 3.19* 3.11*    Studies/Results: Dg Chest Port 1 View  12/02/2013   CLINICAL DATA:  Wheezing  EXAM: PORTABLE CHEST - 1 VIEW  COMPARISON:  December 01, 2013  FINDINGS: There are calcified granulomas in the left mid lung. There is no edema or consolidation. Heart is mildly enlarged with pulmonary vascularity within normal limits. No adenopathy. No bone lesions.  IMPRESSION: No edema or consolidation. Calcified granulomas in left mid lung. Mild cardiac enlargement is stable.   Electronically Signed   By: Lowella Grip M.D.   On: 12/02/2013 13:25    Anti-infectives: Anti-infectives   Start     Dose/Rate Route Frequency Ordered Stop   12/03/13 1400  levofloxacin (LEVAQUIN) IVPB  500 mg     500 mg 100 mL/hr over 60 Minutes Intravenous Every 24 hours 12/03/13 1331        Assessment/Plan: Impression: Gallstone pancreatitis, resolving. Pancreatic enzymes have normalized. Total bilirubin is decreasing. Patient now in ICU do to rapid ventricular rate. INR still remains elevated. Hematology workup pending. Plan: No need for acute surgical intervention. Given all his various issues, it may be prudent to delay cholecystectomy to an outpatient basis. He does have multiple medical issues and he may need surgical intervention at a tertiary care center. We'll continue to follow with you.  LOS: 3 days    Marlynn Hinckley A 12/04/2013

## 2013-12-04 NOTE — Progress Notes (Addendum)
[name] continues to be in a-fib with RVR despite being on diltiazem drip @ 52ml/hr. MD is aware and she ordered a IV dose of digoxin; asked nursing staff to administer his PO metoprolol and diltiazem; and, to continue pt on diltiazem drip @15ml /hr. Will continue to monitor.  1015: Pt continues to be in a-fib with RVR. MD paged and ordered to increase pt's diltiazem drip to 30ml/hr. Will continue to monitor.

## 2013-12-05 ENCOUNTER — Inpatient Hospital Stay (HOSPITAL_COMMUNITY): Payer: Medicare Other

## 2013-12-05 LAB — DIC (DISSEMINATED INTRAVASCULAR COAGULATION) PANEL
APTT: 59 s — AB (ref 24–37)
D DIMER QUANT: 1.31 ug{FEU}/mL — AB (ref 0.00–0.48)
FIBRINOGEN: 522 mg/dL — AB (ref 204–475)
INR: 2.42 — AB (ref 0.00–1.49)
INR: 2.75 — ABNORMAL HIGH (ref 0.00–1.49)
PROTHROMBIN TIME: 26.3 s — AB (ref 11.6–15.2)
PROTHROMBIN TIME: 29.1 s — AB (ref 11.6–15.2)
Platelets: 149 10*3/uL — ABNORMAL LOW (ref 150–400)

## 2013-12-05 LAB — DIC (DISSEMINATED INTRAVASCULAR COAGULATION)PANEL
D-Dimer, Quant: 1.47 ug/mL-FEU — ABNORMAL HIGH (ref 0.00–0.48)
Fibrinogen: 570 mg/dL — ABNORMAL HIGH (ref 204–475)
Platelets: 121 10*3/uL — ABNORMAL LOW (ref 150–400)
Smear Review: NONE SEEN
Smear Review: NONE SEEN
aPTT: 55 seconds — ABNORMAL HIGH (ref 24–37)

## 2013-12-05 LAB — BASIC METABOLIC PANEL
Anion gap: 12 (ref 5–15)
BUN: 13 mg/dL (ref 6–23)
CHLORIDE: 100 meq/L (ref 96–112)
CO2: 25 meq/L (ref 19–32)
CREATININE: 0.79 mg/dL (ref 0.50–1.35)
Calcium: 8.3 mg/dL — ABNORMAL LOW (ref 8.4–10.5)
GFR calc Af Amer: >90 mL/min (ref >90)
GFR calc non Af Amer: 83 mL/min — ABNORMAL LOW (ref >90)
Glucose, Bld: 200 mg/dL — ABNORMAL HIGH (ref 70–99)
Potassium: 4.3 mEq/L (ref 3.7–5.3)
SODIUM: 137 meq/L (ref 137–147)

## 2013-12-05 LAB — CBC
HCT: 37.3 % — ABNORMAL LOW (ref 39.0–52.0)
Hemoglobin: 12.8 g/dL — ABNORMAL LOW (ref 13.0–17.0)
MCH: 30.5 pg (ref 26.0–34.0)
MCHC: 34.3 g/dL (ref 30.0–36.0)
MCV: 88.8 fL (ref 78.0–100.0)
Platelets: 129 10*3/uL — ABNORMAL LOW (ref 150–400)
RBC: 4.2 MIL/uL — AB (ref 4.22–5.81)
RDW: 13.1 % (ref 11.5–15.5)
WBC: 5.3 10*3/uL (ref 4.0–10.5)

## 2013-12-05 LAB — GLUCOSE, CAPILLARY
GLUCOSE-CAPILLARY: 186 mg/dL — AB (ref 70–99)
GLUCOSE-CAPILLARY: 235 mg/dL — AB (ref 70–99)
Glucose-Capillary: 118 mg/dL — ABNORMAL HIGH (ref 70–99)
Glucose-Capillary: 195 mg/dL — ABNORMAL HIGH (ref 70–99)

## 2013-12-05 LAB — PROTIME-INR
INR: 2.55 — ABNORMAL HIGH (ref 0.00–1.49)
Prothrombin Time: 27.4 seconds — ABNORMAL HIGH (ref 11.6–15.2)

## 2013-12-05 MED ORDER — PREDNISONE 20 MG PO TABS
20.0000 mg | ORAL_TABLET | Freq: Every day | ORAL | Status: DC
  Administered 2013-12-05: 20 mg via ORAL
  Filled 2013-12-05: qty 1

## 2013-12-05 MED ORDER — FUROSEMIDE 10 MG/ML IJ SOLN
20.0000 mg | Freq: Once | INTRAMUSCULAR | Status: AC
  Administered 2013-12-05: 20 mg via INTRAVENOUS
  Filled 2013-12-05: qty 2

## 2013-12-05 MED ORDER — METOPROLOL TARTRATE 1 MG/ML IV SOLN
INTRAVENOUS | Status: AC
  Filled 2013-12-05: qty 5

## 2013-12-05 MED ORDER — AMIODARONE LOAD VIA INFUSION
150.0000 mg | Freq: Once | INTRAVENOUS | Status: AC
  Administered 2013-12-05: 150 mg via INTRAVENOUS
  Filled 2013-12-05: qty 83.34

## 2013-12-05 MED ORDER — METOPROLOL TARTRATE 1 MG/ML IV SOLN
5.0000 mg | Freq: Once | INTRAVENOUS | Status: AC
  Administered 2013-12-05: 5 mg via INTRAVENOUS
  Filled 2013-12-05: qty 5

## 2013-12-05 MED ORDER — INSULIN GLARGINE 100 UNIT/ML ~~LOC~~ SOLN
15.0000 [IU] | Freq: Every day | SUBCUTANEOUS | Status: DC
  Administered 2013-12-05 – 2013-12-06 (×2): 15 [IU] via SUBCUTANEOUS
  Filled 2013-12-05 (×2): qty 0.15

## 2013-12-05 MED ORDER — AMIODARONE HCL IN DEXTROSE 360-4.14 MG/200ML-% IV SOLN
30.0000 mg/h | INTRAVENOUS | Status: DC
Start: 2013-12-05 — End: 2013-12-06
  Administered 2013-12-05 – 2013-12-06 (×2): 30 mg/h via INTRAVENOUS
  Filled 2013-12-05 (×2): qty 200

## 2013-12-05 MED ORDER — METOPROLOL TARTRATE 1 MG/ML IV SOLN
5.0000 mg | Freq: Once | INTRAVENOUS | Status: AC
  Administered 2013-12-05: 5 mg via INTRAVENOUS

## 2013-12-05 MED ORDER — AMIODARONE HCL IN DEXTROSE 360-4.14 MG/200ML-% IV SOLN
60.0000 mg/h | INTRAVENOUS | Status: AC
  Administered 2013-12-05 (×2): 60 mg/h via INTRAVENOUS
  Filled 2013-12-05 (×2): qty 200

## 2013-12-05 MED ORDER — LEVOFLOXACIN 500 MG PO TABS
500.0000 mg | ORAL_TABLET | Freq: Every day | ORAL | Status: DC
  Administered 2013-12-05 – 2013-12-09 (×5): 500 mg via ORAL
  Filled 2013-12-05 (×5): qty 1

## 2013-12-05 NOTE — Progress Notes (Signed)
[facility] PROGRESS NOTE  Crews Mccollam ELF:[id number] DOB: 02-Jun-1932 DOA: 12/01/2013 PCP: [name], MD    Code Status: Full code Family Communication: Discussed with the patient; family not available. Disposition Plan: Discharge when clinically appropriate.   Consultants:  Gastroenterology  General surgery  Procedures: 2-D echocardiogram 8/20:   Impressions: - Moderate LVH with LVEF 55-60%, cannot exclude basal inferolateral hypokinesis. Indeterminate diastolic function. Upper normal left atrial chamber size. MAC with trivial mitral regurgitation Mildly ectatic aortic root. Mildly sclerotic aortic valve with mild aortic regurgitation. Moderate RV chamber dilatation with normal contraction. Elevated PASP 56 mm mercury with increased estimated CVP.   MRCP abdomen 12/01/13  CT abdomen 12/01/13  Antibiotics:  Levaquin pending 12/03/13  HPI/Subjective: The patient has no complaints of chest pain or shortness of breath currently. He did have some chest congestion overnight, but it has cleared. He denies abdominal pain, nausea, or vomiting. Per nursing, his heart rate has been in the 120s to 140s overnight on the diltiazem drip.  Objective: Filed Vitals:   12/05/13 0830  BP: 121/84  Pulse: 162  Temp:   Resp: 20   temperature 97.7. oxygen saturation 93 percent on supplemental oxygen.  Intake/Output Summary (Last 24 hours) at 12/05/13 0918 Last data filed at 12/05/13 0700  Gross per 24 hour  Intake 2142.6 ml  Output   1100 ml  Net 1042.6 ml   Filed Weights   12/01/13 0800 12/04/13 0447 12/05/13 0500  Weight: 102.6 kg (226 lb 3.1 oz) 107.5 kg (236 lb 15.9 oz) 109.3 kg (240 lb 15.4 oz)    Exam:   General:  Morbidly obese 80 year old sitting up in bed, in no acute distress.  Cardiovascular: Irregular, irregular with tachycardia. No pedal edema.  Respiratory: Near resolution of upper airway wheezes; lungs clear otherwise. Breathing is  nonlabored.  Abdomen: Obese abdomen, positive bowel sounds, soft, nontender nondistended.  Musculoskeletal: No acute hot red joints.   Neurologic: He is alert when aroused and is oriented x3. Cranial nerves II through XII are grossly intact.  Data Reviewed: Basic Metabolic Panel:  Recent Labs Lab 12/01/13 0624 12/02/13 0548 12/03/13 0530 12/04/13 0517 12/05/13 0553  NA 139 141 138 136* 137  K 3.8 3.5* 3.8 4.0 4.3  CL 101 104 100 99 100  CO2 28 25 24 26 25   GLUCOSE 168* 132* 194* 197* 200*  BUN 23 23 19 14 13   CREATININE 1.36* 1.24 1.10 0.78 0.79  CALCIUM 8.6 8.1* 8.2* 8.1* 8.3*   Liver Function Tests:  Recent Labs Lab 12/01/13 0108 12/01/13 0624 12/02/13 0548 12/03/13 0530 12/04/13 0517  AST 83* 592* 285* 96* 39*  ALT 45 365* 446* 278* 177*  ALKPHOS 101 134* 136* 140* 130*  BILITOT 1.3* 2.5* 4.0* 3.3* 2.0*  PROT 7.5 6.4 6.4 6.5 6.1  ALBUMIN 4.3 3.7 3.4* 3.3* 2.9*    Recent Labs Lab 12/01/13 0108 12/01/13 0624 12/02/13 0548 12/04/13 0517  LIPASE 939* 582* 152* 27   No results found for this basename: AMMONIA,  in the last 168 hours CBC:  Recent Labs Lab 12/01/13 0108 12/01/13 0624 12/02/13 0548 12/03/13 0530  12/04/13 0042 12/04/13 0517 12/04/13 1239 12/05/13 0010 12/05/13 0553  WBC 13.8* 12.9* 10.8* 9.1  --   --  5.6  --   --  5.3  NEUTROABS 10.3*  --   --   --   --   --   --   --   --   --   HGB 14.5 13.4 13.1  12.7*  --   --  12.6*  --   --  12.8*  HCT 41.7 38.4* 38.9* 38.1*  --   --  36.3*  --   --  37.3*  MCV 88.3 89.3 90.5 90.7  --   --  88.3  --   --  88.8  PLT 204 153 126* 108*  < > 98* 113* 118* 121* 129*  < > = values in this interval not displayed. Cardiac Enzymes: No results found for this basename: CKTOTAL, CKMB, CKMBINDEX, TROPONINI,  in the last 168 hours BNP (last 3 results)  Recent Labs  12/02/13 1212  PROBNP 3926.0*   CBG:  Recent Labs Lab 12/04/13 0748 12/04/13 1119 12/04/13 1615 12/04/13 2127 12/05/13 0731   GLUCAP 191* 208* 129* 188* 186*    Recent Results (from the past 240 hour(s))  MRSA PCR SCREENING     Status: None   Collection Time    12/03/13  6:55 PM      Result Value Ref Range Status   MRSA by PCR NEGATIVE  NEGATIVE Final   Comment:            The GeneXpert MRSA Assay (FDA     approved for NASAL specimens     only), is one component of a     comprehensive MRSA colonization     surveillance program. It is not     intended to diagnose MRSA     infection nor to guide or     monitor treatment for     MRSA infections.     Studies: Dg Chest Port 1 View  12/05/2013   CLINICAL DATA:  Wheezing, history of atrial fibrillation and diabetes  EXAM: PORTABLE CHEST - 1 VIEW  COMPARISON:  Portable chest x-ray of December 02, 2013  FINDINGS: The lungs are reasonably well inflated. There is no focal infiltrate. The interstitial markings are coarse and are more conspicuous today. There is stable calcified nodularity over the lateral aspect of the left sixth rib. The cardiac silhouette is mildly enlarged but stable. The central pulmonary vascularity is prominent without cephalization.  IMPRESSION: There has been slight interval increase in the conspicuity of the pulmonary interstitium which may reflect mild interstitial edema. A followup PA and lateral chest film would be useful when the patient can tolerate the procedure.   Electronically Signed   By: David  Martinique   On: 12/05/2013 08:47    Scheduled Meds: . atorvastatin  20 mg Oral q1800  . doxazosin  2 mg Oral QHS  . insulin aspart  0-15 Units Subcutaneous TID WC  . insulin aspart  0-5 Units Subcutaneous QHS  . levofloxacin (LEVAQUIN) IV  500 mg Intravenous Q24H  . levothyroxine  50 mcg Oral QAC breakfast  . lisinopril  20 mg Oral Daily  . metoprolol succinate  25 mg Oral Daily  . polyethylene glycol  17 g Oral Daily  . predniSONE  30 mg Oral Q supper  . senna-docusate  1 tablet Oral QHS  . sodium chloride  500 mL Intravenous Once  .  sodium chloride  3 mL Intravenous Q12H   Continuous Infusions: . amiodarone 60 mg/hr (12/05/13 0850)   Followed by  . amiodarone     Assessment and plan: Principal Problem:   Pancreatitis due to biliary obstruction Active Problems:   Choledocholithiasis   Cholelithiasis   Atrial fibrillation with RVR   Transaminitis   Thrombocytopenia, unspecified   Type 2 diabetes mellitus with diabetic nephropathy  Hypertension   Chronic kidney disease (CKD), stage III (moderate)   Hypothyroidism   Wheezing   Obesity, morbid   Pulmonary hypertension   DIC (disseminated intravascular coagulation)    1. Acute biliary pancreatitis with CT evidence of common bile duct stone on admission/cholelithiasis/choledocholithiasis. CT on admission revealed a common bile duct stone. GI was consulted and ordered MRCP on 8/19. It did not reveal a common bile duct stone, but it did reveal gallstones in the neck of the bladder; possible passage of distal CBD stone. The patient's lipase, total bilirubin, and liver transaminases have improved-his lipase is now within normal limits. He is symptomatically improved and tolerating his full liquid diet. Will try a low-fat diet.  ERCP was considered, but is being held off for now per GI. He will need a laparoscopic cholecystectomy and [name] planned the operation for this week, but given the patient's cardiac status, he is deferring surgical intervention. He went on to say that the patient may need surgical intervention at a tertiary care center.  Transaminitis and hyperbilirubinemia, secondary to #1. The patient's liver transaminases and total bilirubin are now trending downward. Continue to monitor. Chronic atrial fibrillation, with RVR.... Resultant sinus pauses after being given 2 doses of digoxin. His rapid rate is likely exacerbated by his acute illness. Possible contribution from [place]. Xopenex has been changed to when necessary. His 2-D  echocardiogram revealed ejection fraction of 55-60%. He was transferred to the step down unit on the diltiazem drip and continued on oral diltiazem and metoprolol. 2 dose of of digoxin were given with several sinus pauses lasting no more than 2.5 seconds. I curbside consulted cardiologist, [name] who recommended discontinuation of the diltiazem drip and oral diltiazem and starting him on an amiodarone drip. We'll continue to monitor. Will likely need to restart Coumadin soon since surgery will not be done during this hospitalization. Flash pulmonary edema. This is likely not from decompensated congestive heart failure, but from rapid atrial fibrillation. We'll decrease the IV fluids and give him another dose of IV Lasix. Coagulopathy, secondary to warfarin. His INR had increased to greater than 3, but is now below 3. No signs of bleeding. Coumadin is on hold, but consider restarting it tomorrow. Thrombocytopenia. Etiology possibly secondary to low-grade DIC per hematology who was consulted. Workup reveals DIC panel with no schistocytes; TSH is within normal limits. Hepatitis panel so far, nonreactive. Vitamin B12 level within normal limits. HIV antibody negative. Cardiolipin antibodies and ANA are pending.. His platelet count was 204 on admission and has decreased daily, but it is now trending up. I do not believe he was given heparin as he is already anticoagulated. His spleen on CT scan was apparently "normal "per radiology. He was started on Protonix on admission which can cause thrombocytopenia, so it was discontinued. Currently, there are no signs of bleeding. Pulmonary hypertension, per 2-D echocardiogram. Echo revealed peak pulmonary artery pressure of 56 mm of mercury. He does not appear to have cor pulmonale. Continue to monitor. Hypertension. Currently stable and controlled on lisinopril, diltiazem, and lisinopril. HCTZ is on hold and now diltiazem is being held. Diabetes mellitus with  diabetic nephropathy. Metformin is on hold given the recent IV contrast. We'll continue sliding scale NovoLog and add Lantus as his blood glucose is increasing with the prednisone. His hemoglobin A1c was 6.7. Stage III chronic kidney disease. His creatinine has improved from baseline. Continue to monitor. Hypothyroidism. Currently stable on Synthroid. His TSH is within normal limits at 3.1.  Wheezes, possibly upper airway reactive airways or acute bronchitis. His bronchospasms have decreased with the start of small dosing of prednisone, Xopenex, guaifenesin and added Levaquin. Xopenex changed to when necessary because of the rapid ventricular rate. The patient was given Lasix  on 8/21 and his IV fluids were decreased for increase in wheezing, but the patient has had upper airway wheezes since admission. According to the history, this appears to be chronic. I do not believe he has congestive heart failure. Most of his wheezes are upper airway and the patient may be an ENT evaluation as an outpatient.    Time spent: 30 minutes    [facility] Pager [phone]. If 7PM-7AM, please contact night-coverage at [email or web address], password [facility] 12/05/2013, 9:18 AM  LOS: 4 days

## 2013-12-05 NOTE — Progress Notes (Signed)
  Subjective: Tolerating soft diet well. Had a small bowel movement yesterday. No abdominal pain noted.  Objective: Vital signs in last 24 hours: Temp:  [97.5 F (36.4 C)-98.1 F (36.7 C)] 97.8 F (36.6 C) (08/23 1115) Pulse Rate:  [25-162] 73 (08/23 1100) Resp:  [16-26] 18 (08/23 1100) BP: (93-146)/(62-113) 135/110 mmHg (08/23 1100) SpO2:  [87 %-100 %] 96 % (08/23 1100) Weight:  [109.3 kg (240 lb 15.4 oz)] 109.3 kg (240 lb 15.4 oz) (08/23 0500) Last BM Date: 12/04/13  Intake/Output from previous day: 08/22 0701 - 08/23 0700 In: 2272.6 [I.V.:2072.6; IV Piggyback:200] Out: 1300 [Urine:1300] Intake/Output this shift: Total I/O In: 205.6 [I.V.:205.6] Out: 550 [Urine:550]  General appearance: alert, cooperative and no distress GI: soft, non-tender; bowel sounds normal; no masses,  no organomegaly  Lab Results:   Recent Labs  12/04/13 0517  12/05/13 0010 12/05/13 0553  WBC 5.6  --   --  5.3  HGB 12.6*  --   --  12.8*  HCT 36.3*  --   --  37.3*  PLT 113*  < > 121* 129*  < > = values in this interval not displayed. BMET  Recent Labs  12/04/13 0517 12/05/13 0553  NA 136* 137  K 4.0 4.3  CL 99 100  CO2 26 25  GLUCOSE 197* 200*  BUN 14 13  CREATININE 0.78 0.79  CALCIUM 8.1* 8.3*   PT/INR  Recent Labs  12/05/13 0010 12/05/13 0553  LABPROT 29.1* 27.4*  INR 2.75* 2.55*    Studies/Results: Dg Chest Port 1 View  12/05/2013   CLINICAL DATA:  Wheezing, history of atrial fibrillation and diabetes  EXAM: PORTABLE CHEST - 1 VIEW  COMPARISON:  Portable chest x-ray of December 02, 2013  FINDINGS: The lungs are reasonably well inflated. There is no focal infiltrate. The interstitial markings are coarse and are more conspicuous today. There is stable calcified nodularity over the lateral aspect of the left sixth rib. The cardiac silhouette is mildly enlarged but stable. The central pulmonary vascularity is prominent without cephalization.  IMPRESSION: There has been slight  interval increase in the conspicuity of the pulmonary interstitium which may reflect mild interstitial edema. A followup PA and lateral chest film would be useful when the patient can tolerate the procedure.   Electronically Signed   By: David  Martinique   On: 12/05/2013 08:47    Anti-infectives: Anti-infectives   Start     Dose/Rate Route Frequency Ordered Stop   12/05/13 1200  levofloxacin (LEVAQUIN) tablet 500 mg     500 mg Oral Daily 12/05/13 0953     12/03/13 1400  levofloxacin (LEVAQUIN) IVPB 500 mg  Status:  Discontinued     500 mg 100 mL/hr over 60 Minutes Intravenous Every 24 hours 12/03/13 1331 12/05/13 0953      Assessment/Plan: s/p  Impression: Gallstone pancreatitis, resolved clinically. Still in rapid atrial fibrillation. Now is on an amiodarone drip. Plan: Will check liver profile in a.m. No need for surgical intervention at this time.  LOS: 4 days    Mordecai Tindol A 12/05/2013

## 2013-12-05 NOTE — Progress Notes (Signed)
MEDICATION RELATED CONSULT NOTE - INITIAL   Pharmacy Consult for Drug Interactions with Amiodarone  No Known Allergies  Patient Measurements: Height: 6' (182.9 cm) Weight: 240 lb 15.4 oz (109.3 kg) IBW/kg (Calculated) : 77.6  Vital Signs: Temp: 97.5 F (36.4 C) (08/23 0748) Temp src: Oral (08/23 0748) BP: 126/82 mmHg (08/23 0930) Pulse Rate: 133 (08/23 0930) Intake/Output from previous day: 08/22 0701 - 08/23 0700 In: 2272.6 [I.V.:2072.6; IV Piggyback:200] Out: 1300 [Urine:1300] Intake/Output from this shift: Total I/O In: -  Out: 300 [Urine:300]  Labs:  Recent Labs  12/03/13 0530  12/04/13 0042 12/04/13 0517 12/04/13 1239 12/05/13 0010 12/05/13 0553  WBC 9.1  --   --  5.6  --   --  5.3  HGB 12.7*  --   --  12.6*  --   --  12.8*  HCT 38.1*  --   --  36.3*  --   --  37.3*  PLT 108*  < > 98* 113* 118* 121* 129*  APTT  --   < > 65*  --  63* 55*  --   CREATININE 1.10  --   --  0.78  --   --  0.79  ALBUMIN 3.3*  --   --  2.9*  --   --   --   PROT 6.5  --   --  6.1  --   --   --   AST 96*  --   --  39*  --   --   --   ALT 278*  --   --  177*  --   --   --   ALKPHOS 140*  --   --  130*  --   --   --   BILITOT 3.3*  --   --  2.0*  --   --   --   BILIDIR 1.6*  --   --   --   --   --   --   IBILI 1.7*  --   --   --   --   --   --   < > = values in this interval not displayed. Estimated Creatinine Clearance: 95.6 ml/min (by C-G formula based on Cr of 0.79).   Microbiology: Recent Results (from the past 720 hour(s))  MRSA PCR SCREENING     Status: None   Collection Time    12/03/13  6:55 PM      Result Value Ref Range Status   MRSA by PCR NEGATIVE  NEGATIVE Final   Comment:            The GeneXpert MRSA Assay (FDA     approved for NASAL specimens     only), is one component of a     comprehensive MRSA colonization     surveillance program. It is not     intended to diagnose MRSA     infection nor to guide or     monitor treatment for     MRSA infections.     Medical History: Past Medical History  Diagnosis Date  . Diabetes mellitus   . Hypertension   . Atrial fibrillation   . Chronic kidney disease (CKD), stage III (moderate)   . Hypothyroidism   . Pulmonary hypertension 12/03/2013    PA pressure 56. Ejection fraction 55-60%    Medications:  Scheduled:  . atorvastatin  20 mg Oral q1800  . doxazosin  2 mg Oral QHS  . insulin aspart  0-15  Units Subcutaneous TID WC  . insulin aspart  0-5 Units Subcutaneous QHS  . insulin glargine  15 Units Subcutaneous QHS  . levofloxacin (LEVAQUIN) IV  500 mg Intravenous Q24H  . levothyroxine  50 mcg Oral QAC breakfast  . lisinopril  20 mg Oral Daily  . metoprolol succinate  25 mg Oral Daily  . polyethylene glycol  17 g Oral Daily  . predniSONE  20 mg Oral Q supper  . senna-docusate  1 tablet Oral QHS  . sodium chloride  500 mL Intravenous Once  . sodium chloride  3 mL Intravenous Q12H    Assessment/Plan: Patient in AFib was started on amiodarone per cardiology recommendation.   He is currently on day#3 Levaquin for COPD exacerbation which can also prolong QTc interval.  Baseline QTc=399 mSec.   Would recommend minimize duration of Levaquin & monitor QTc interval.   Change Levaquin to PO today per [employer] policy.  Biagio Borg 12/05/2013,9:43 AM

## 2013-12-06 ENCOUNTER — Encounter (HOSPITAL_COMMUNITY): Payer: Self-pay | Admitting: Adult Health

## 2013-12-06 DIAGNOSIS — J209 Acute bronchitis, unspecified: Secondary | ICD-10-CM | POA: Diagnosis present

## 2013-12-06 DIAGNOSIS — I2789 Other specified pulmonary heart diseases: Secondary | ICD-10-CM

## 2013-12-06 DIAGNOSIS — N183 Chronic kidney disease, stage 3 unspecified: Secondary | ICD-10-CM

## 2013-12-06 DIAGNOSIS — N189 Chronic kidney disease, unspecified: Secondary | ICD-10-CM

## 2013-12-06 DIAGNOSIS — I4891 Unspecified atrial fibrillation: Secondary | ICD-10-CM

## 2013-12-06 LAB — DIC (DISSEMINATED INTRAVASCULAR COAGULATION) PANEL
APTT: 49 s — AB (ref 24–37)
INR: 2.13 — AB (ref 0.00–1.49)
PLATELETS: 144 10*3/uL — AB (ref 150–400)
PROTHROMBIN TIME: 23.8 s — AB (ref 11.6–15.2)

## 2013-12-06 LAB — CBC
HCT: 39.3 % (ref 39.0–52.0)
HEMOGLOBIN: 13 g/dL (ref 13.0–17.0)
MCH: 29.9 pg (ref 26.0–34.0)
MCHC: 33.1 g/dL (ref 30.0–36.0)
MCV: 90.3 fL (ref 78.0–100.0)
Platelets: 151 10*3/uL (ref 150–400)
RBC: 4.35 MIL/uL (ref 4.22–5.81)
RDW: 13 % (ref 11.5–15.5)
WBC: 5.7 10*3/uL (ref 4.0–10.5)

## 2013-12-06 LAB — PROTIME-INR
INR: 1.98 — AB (ref 0.00–1.49)
PROTHROMBIN TIME: 22.5 s — AB (ref 11.6–15.2)

## 2013-12-06 LAB — COMPREHENSIVE METABOLIC PANEL
ALBUMIN: 2.7 g/dL — AB (ref 3.5–5.2)
ALK PHOS: 142 U/L — AB (ref 39–117)
ALT: 115 U/L — AB (ref 0–53)
AST: 33 U/L (ref 0–37)
Anion gap: 8 (ref 5–15)
BUN: 17 mg/dL (ref 6–23)
CO2: 30 mEq/L (ref 19–32)
Calcium: 8.3 mg/dL — ABNORMAL LOW (ref 8.4–10.5)
Chloride: 98 mEq/L (ref 96–112)
Creatinine, Ser: 0.89 mg/dL (ref 0.50–1.35)
GFR calc non Af Amer: 79 mL/min — ABNORMAL LOW (ref >90)
GLUCOSE: 198 mg/dL — AB (ref 70–99)
POTASSIUM: 3.8 meq/L (ref 3.7–5.3)
Sodium: 136 mEq/L — ABNORMAL LOW (ref 137–147)
Total Bilirubin: 0.9 mg/dL (ref 0.3–1.2)
Total Protein: 5.8 g/dL — ABNORMAL LOW (ref 6.0–8.3)

## 2013-12-06 LAB — GLUCOSE, CAPILLARY
Glucose-Capillary: 151 mg/dL — ABNORMAL HIGH (ref 70–99)
Glucose-Capillary: 158 mg/dL — ABNORMAL HIGH (ref 70–99)
Glucose-Capillary: 196 mg/dL — ABNORMAL HIGH (ref 70–99)
Glucose-Capillary: 240 mg/dL — ABNORMAL HIGH (ref 70–99)

## 2013-12-06 LAB — ANA: Anti Nuclear Antibody(ANA): NEGATIVE

## 2013-12-06 LAB — DIC (DISSEMINATED INTRAVASCULAR COAGULATION)PANEL
D-Dimer, Quant: 1.28 ug/mL-FEU — ABNORMAL HIGH (ref 0.00–0.48)
Fibrinogen: 481 mg/dL — ABNORMAL HIGH (ref 204–475)
Smear Review: NONE SEEN

## 2013-12-06 LAB — CARDIOLIPIN ANTIBODIES, IGG, IGM, IGA
ANTICARDIOLIPIN IGA: 14 U/mL (ref <22)
ANTICARDIOLIPIN IGG: 11 GPL U/mL — AB (ref <23)
ANTICARDIOLIPIN IGM: 78 [MPL'U]/mL — AB (ref <11)

## 2013-12-06 LAB — BILIRUBIN, DIRECT: Bilirubin, Direct: 0.3 mg/dL (ref 0.0–0.3)

## 2013-12-06 MED ORDER — LORAZEPAM 0.5 MG PO TABS
2.0000 mg | ORAL_TABLET | Freq: Once | ORAL | Status: AC
  Administered 2013-12-06: 2 mg via ORAL
  Filled 2013-12-06: qty 4

## 2013-12-06 MED ORDER — METOPROLOL TARTRATE 1 MG/ML IV SOLN
5.0000 mg | Freq: Once | INTRAVENOUS | Status: AC
  Administered 2013-12-06: 5 mg via INTRAVENOUS
  Filled 2013-12-06: qty 5

## 2013-12-06 MED ORDER — PREDNISONE 20 MG PO TABS
40.0000 mg | ORAL_TABLET | Freq: Two times a day (BID) | ORAL | Status: DC
  Administered 2013-12-06 – 2013-12-07 (×3): 40 mg via ORAL
  Filled 2013-12-06 (×3): qty 2

## 2013-12-06 MED ORDER — DILTIAZEM HCL 100 MG IV SOLR
5.0000 mg/h | INTRAVENOUS | Status: DC
  Administered 2013-12-06 (×2): 15 mg/h via INTRAVENOUS
  Administered 2013-12-06 – 2013-12-07 (×2): 5 mg/h via INTRAVENOUS
  Administered 2013-12-07 – 2013-12-08 (×4): 15 mg/h via INTRAVENOUS
  Administered 2013-12-08: 10 mg/h via INTRAVENOUS
  Filled 2013-12-06: qty 100

## 2013-12-06 MED ORDER — METOPROLOL SUCCINATE ER 50 MG PO TB24
50.0000 mg | ORAL_TABLET | Freq: Every day | ORAL | Status: DC
  Administered 2013-12-06: 50 mg via ORAL
  Filled 2013-12-06: qty 1

## 2013-12-06 MED ORDER — POTASSIUM CHLORIDE 20 MEQ PO PACK
20.0000 meq | PACK | Freq: Every day | ORAL | Status: DC
  Administered 2013-12-06 – 2013-12-11 (×6): 20 meq via ORAL
  Filled 2013-12-06 (×8): qty 1

## 2013-12-06 MED ORDER — WARFARIN - PHARMACIST DOSING INPATIENT
Status: DC
  Administered 2013-12-08: 16:00:00

## 2013-12-06 MED ORDER — WARFARIN SODIUM 2 MG PO TABS
3.0000 mg | ORAL_TABLET | Freq: Once | ORAL | Status: AC
  Administered 2013-12-06: 3 mg via ORAL
  Filled 2013-12-06: qty 1

## 2013-12-06 MED ORDER — METOPROLOL SUCCINATE ER 25 MG PO TB24
25.0000 mg | ORAL_TABLET | Freq: Every day | ORAL | Status: DC
  Administered 2013-12-07: 25 mg via ORAL
  Filled 2013-12-06: qty 1

## 2013-12-06 MED ORDER — DILTIAZEM LOAD VIA INFUSION
10.0000 mg | Freq: Once | INTRAVENOUS | Status: AC
  Administered 2013-12-06: 10 mg via INTRAVENOUS
  Filled 2013-12-06: qty 10

## 2013-12-06 MED ORDER — LEVALBUTEROL HCL 0.63 MG/3ML IN NEBU
0.6300 mg | INHALATION_SOLUTION | Freq: Four times a day (QID) | RESPIRATORY_TRACT | Status: DC
  Administered 2013-12-06 – 2013-12-07 (×4): 0.63 mg via RESPIRATORY_TRACT
  Filled 2013-12-06 (×4): qty 3

## 2013-12-06 NOTE — Progress Notes (Signed)
Liver enzyme tests have normalized. Patient is asymptomatic from his gallstone pancreatitis. He is still running an elevated heart rate while on amiodarone.  Agree with restarting Coumadin. I do not feel would benefit him at this point to have his gallbladder removed prior to discharge. Given his unstable rhythm and history of coagulopathy, it may be prudent for him to have his definitive laparoscopic cholecystectomy at tertiary care center. I have discussed this with the patient. This can be arranged through his primary care physician's office or mine.

## 2013-12-06 NOTE — Progress Notes (Signed)
[facility] PROGRESS NOTE  Brett [name] DIY:[id number] DOB: 01-17-1933 DOA: 12/01/2013 PCP: [name], MD    Code Status: Full code Family Communication: Discussed with the patient; family not available. Disposition Plan: Discharge when clinically appropriate.   Consultants:  Gastroenterology  General surgery  Cardiology  Procedures: 2-D echocardiogram 8/20:   Impressions: - Moderate LVH with LVEF 55-60%, cannot exclude basal inferolateral hypokinesis. Indeterminate diastolic function. Upper normal left atrial chamber size. MAC with trivial mitral regurgitation Mildly ectatic aortic root. Mildly sclerotic aortic valve with mild aortic regurgitation. Moderate RV chamber dilatation with normal contraction. Elevated PASP 56 mm mercury with increased estimated CVP.   MRCP abdomen 12/01/13  CT abdomen 12/01/13  Antibiotics:  Levaquin 12/03/13>>  HPI/Subjective:  Brief history: The patient is a 80 year old man with a history of chronic atrial fibrillation-on Coumadin, diabetes mellitus, and hypertension, who presented to the emergency department on 12/01/2013 with a chief complaint of abdominal pain. On admission, his lab data were significant for a lipase of 939, AST of 83, and total bilirubin of 1.3. CT of his gallbladder revealed cholelithiasis, gallbladder distention, and a 4 mm mid to distal common bile duct stone. Gastroenterology was consulted. ERCP was considered, but the patient was coagulopathic from Coumadin. Therefore, an MRCP was ordered and it did not reveal a common bile duct stone; the suspicion was that the stone had passed. Cholecystectomy was recommended and general surgery was consulted. The plan was to perform a laparoscopic cholecystectomy when his INR was in an acceptable range, however, the patient developed rapid atrial fibrillation. [name] has deferred the surgery to an elective status. He recommends that the patient's elective cholecystectomy be  done at a tertiary care center given his comorbid conditions. The patient's lipase has normalized and his liver transaminases have nearly normalized. His rapid atrial fibrillation has been difficult to manage as he was started on a diltiazem drip for several days. A couple doses of digoxin was given, but he developed sinus pauses. Following a curbside consult with [name], amiodarone drip was started. However, it was discontinued by [name] because the patient had a history of feeling bad while on it in the past. It was also discontinued as the patient appeared to be wheezing more. Diltiazem drip was restarted. Of note, his 2-D echocardiogram revealed preserved left ventricular systolic function. Coumadin was restarted on 8/24. The patient's platelet count also decreased. Hematology was consulted and believed that the patient had low-grade DIC. His platelet count normalized. He can be discharged to home when his atrial fibrillation is under better control.    Subjective: The patient has no complaints of chest pain or shortness of breath currently. He denies abdominal pain, nausea, or vomiting with advancement of his diet.  Objective: Filed Vitals:   12/06/13 1000  BP: 115/91  Pulse: 116  Temp:   Resp: 21   temperature 97.5. Oxygen saturation 97% on room air.  Intake/Output Summary (Last 24 hours) at 12/06/13 1017 Last data filed at 12/06/13 0803  Gross per 24 hour  Intake    742 ml  Output   2675 ml  Net  -1933 ml   Filed Weights   12/04/13 0447 12/05/13 0500 12/06/13 0445  Weight: 107.5 kg (236 lb 15.9 oz) 109.3 kg (240 lb 15.4 oz) 109 kg (240 lb 4.8 oz)    Exam:   General:  Morbidly obese 80 year old sitting up in bed, in no acute distress.  Cardiovascular: Irregular, irregular with tachycardia. No pedal edema.  Respiratory: Diffuse  expiratory wheezes.  Abdomen: Obese abdomen, positive bowel sounds, soft, nontender nondistended.  Musculoskeletal: No acute hot red  joints.   Neurologic: He is alert and oriented x3. Cranial nerves II through XII are grossly intact.  Data Reviewed: Basic Metabolic Panel:  Recent Labs Lab 12/02/13 0548 12/03/13 0530 12/04/13 0517 12/05/13 0553 12/06/13 0450  NA 141 138 136* 137 136*  K 3.5* 3.8 4.0 4.3 3.8  CL 104 100 99 100 98  CO2 25 24 26 25 30   GLUCOSE 132* 194* 197* 200* 198*  BUN 23 19 14 13 17   CREATININE 1.24 1.10 0.78 0.79 0.89  CALCIUM 8.1* 8.2* 8.1* 8.3* 8.3*   Liver Function Tests:  Recent Labs Lab 12/01/13 0624 12/02/13 0548 12/03/13 0530 12/04/13 0517 12/06/13 0450  AST 592* 285* 96* 39* 33  ALT 365* 446* 278* 177* 115*  ALKPHOS 134* 136* 140* 130* 142*  BILITOT 2.5* 4.0* 3.3* 2.0* 0.9  PROT 6.4 6.4 6.5 6.1 5.8*  ALBUMIN 3.7 3.4* 3.3* 2.9* 2.7*    Recent Labs Lab 12/01/13 0108 12/01/13 0624 12/02/13 0548 12/04/13 0517  LIPASE 939* 582* 152* 27   No results found for this basename: AMMONIA,  in the last 168 hours CBC:  Recent Labs Lab 12/01/13 0108  12/02/13 0548 12/03/13 0530  12/04/13 0517  12/05/13 0010 12/05/13 0553 12/05/13 1152 12/06/13 0022 12/06/13 0450  WBC 13.8*  < > 10.8* 9.1  --  5.6  --   --  5.3  --   --  5.7  NEUTROABS 10.3*  --   --   --   --   --   --   --   --   --   --   --   HGB 14.5  < > 13.1 12.7*  --  12.6*  --   --  12.8*  --   --  13.0  HCT 41.7  < > 38.9* 38.1*  --  36.3*  --   --  37.3*  --   --  39.3  MCV 88.3  < > 90.5 90.7  --  88.3  --   --  88.8  --   --  90.3  PLT 204  < > 126* 108*  < > 113*  < > 121* 129* 149* 144* 151  < > = values in this interval not displayed. Cardiac Enzymes: No results found for this basename: CKTOTAL, CKMB, CKMBINDEX, TROPONINI,  in the last 168 hours BNP (last 3 results)  Recent Labs  12/02/13 1212  PROBNP 3926.0*   CBG:  Recent Labs Lab 12/05/13 0731 12/05/13 1107 12/05/13 1637 12/05/13 2123 12/06/13 0735  GLUCAP 186* 235* 118* 195* 151*    Recent Results (from the past 240 hour(s))   MRSA PCR SCREENING     Status: None   Collection Time    12/03/13  6:55 PM      Result Value Ref Range Status   MRSA by PCR NEGATIVE  NEGATIVE Final   Comment:            The GeneXpert MRSA Assay (FDA     approved for NASAL specimens     only), is one component of a     comprehensive MRSA colonization     surveillance program. It is not     intended to diagnose MRSA     infection nor to guide or     monitor treatment for     MRSA infections.     Studies: Dg  Chest Port 1 View  12/05/2013   CLINICAL DATA:  Wheezing, history of atrial fibrillation and diabetes  EXAM: PORTABLE CHEST - 1 VIEW  COMPARISON:  Portable chest x-ray of December 02, 2013  FINDINGS: The lungs are reasonably well inflated. There is no focal infiltrate. The interstitial markings are coarse and are more conspicuous today. There is stable calcified nodularity over the lateral aspect of the left sixth rib. The cardiac silhouette is mildly enlarged but stable. The central pulmonary vascularity is prominent without cephalization.  IMPRESSION: There has been slight interval increase in the conspicuity of the pulmonary interstitium which may reflect mild interstitial edema. A followup PA and lateral chest film would be useful when the patient can tolerate the procedure.   Electronically Signed   By: David  Martinique   On: 12/05/2013 08:47    Scheduled Meds: . atorvastatin  20 mg Oral q1800  . doxazosin  2 mg Oral QHS  . insulin aspart  0-15 Units Subcutaneous TID WC  . insulin aspart  0-5 Units Subcutaneous QHS  . insulin glargine  15 Units Subcutaneous QHS  . levofloxacin  500 mg Oral Daily  . levothyroxine  50 mcg Oral QAC breakfast  . lisinopril  20 mg Oral Daily  . [START ON 12/07/2013] metoprolol succinate  25 mg Oral Daily  . polyethylene glycol  17 g Oral Daily  . potassium chloride  20 mEq Oral Daily  . predniSONE  20 mg Oral Q supper  . senna-docusate  1 tablet Oral QHS  . sodium chloride  500 mL Intravenous Once   . sodium chloride  3 mL Intravenous Q12H  . warfarin  3 mg Oral Once  . [START ON 12/07/2013] Warfarin - Pharmacist Dosing Inpatient   Does not apply Q24H   Continuous Infusions: . diltiazem (CARDIZEM) infusion 10 mg/hr (12/06/13 1000)   Assessment and plan: Principal Problem:   Pancreatitis due to biliary obstruction Active Problems:   Choledocholithiasis   Cholelithiasis   Atrial fibrillation with RVR   Transaminitis   Thrombocytopenia, unspecified   Type 2 diabetes mellitus with diabetic nephropathy   Hypertension   Chronic kidney disease (CKD), stage III (moderate)   Hypothyroidism   Wheezing   Obesity, morbid   Pulmonary hypertension   DIC (disseminated intravascular coagulation)    1. Acute biliary pancreatitis with CT evidence of common bile duct stone on admission/cholelithiasis/choledocholithiasis. CT on admission revealed a common bile duct stone. GI was consulted. ERCP was considered, but the patient was coagulopathic from Coumadin. Therefore, a MRCP was ordered on 8/19. It did not reveal a common bile duct stone, but it did reveal gallstones in the neck of the bladder; possible passage of distal CBD stone. The patient's lipase, total bilirubin, and liver transaminases have improved-his lipase is now within normal limits. He is symptomatically improved and tolerating his regular diet. GI. He will need a laparoscopic cholecystectomy and [name] planned the operation for this week, but given the patient's cardiac status, he is deferring surgical intervention. He recommends that the surgical intervention be done electively at a tertiary care center.  Transaminitis and hyperbilirubinemia, secondary to #1. The patient's liver transaminases and total bilirubin are now trending downward. Continue to monitor. Chronic atrial fibrillation, with RVR.... ..Marland KitchenResultant sinus pauses after being given 2 doses of digoxin. His rapid rate is likely exacerbated by his acute illness.  Possible contribution from [place]. His 2-D echocardiogram revealed ejection fraction of 55-60%. His TSH is within normal limits. He was transferred to  the step down unit on the diltiazem drip and continued on oral diltiazem and metoprolol. 2 dose of of digoxin were given with several sinus pauses lasting no more than 2.5 seconds. Following the curbside consult with cardiologist, [name] over the weekend, he recommended discontinuation of the diltiazem drip and oral diltiazem and starting him on an amiodarone drip. Followup cardiologist recommended discontinuing the amiodarone as the patient was on it in the distant past, but felt bad on it. It was also discontinued because of worsening wheezing. Diltiazem drip has been restarted. Of note, the patient was given IV metoprolol and a higher dose of metoprolol by the cross cover provider, but this may have also led to increased wheezing. Toprol-XL return to prehospital dose. Will restart Coumadin per pharmacy. We'll continue to monitor. Flash pulmonary edema possibly secondary to acute diastolic heart failure in the setting of RVR. IV fluids were decreased. He was given a total of 2 IV doses of Lasix with improvement. Continue to monitor for need for oral Lasix. Coagulopathy, secondary to warfarin. His INR had increased to greater than 3, but is now below 2. No signs of bleeding. Coumadin was on hold, but will restart today per pharmacy. Thrombocytopenia. Now resolved. Etiology possibly secondary to low-grade DIC per hematology who was consulted. Workup reveals DIC panel with no schistocytes; TSH is within normal limits. Hepatitis panel so far, nonreactive. Vitamin B12 level within normal limits. HIV antibody negative. Cardiolipin antibodies and ANA are pending.. His platelet count was 204 on admission and has decreased daily, but it is now trending up. I do not believe he was given heparin as he is already anticoagulated. His spleen on CT scan was  apparently "normal "per radiology. He was started on Protonix on admission which can cause thrombocytopenia, so it was discontinued. Currently, there are no signs of bleeding. Pulmonary hypertension, per 2-D echocardiogram. Echo revealed peak pulmonary artery pressure of 56 mm of mercury. He does not appear to have cor pulmonale. Continue to monitor. Hypertension. Currently stable and controlled on lisinopril, diltiazem, and lisinopril. HCTZ is on hold. Diabetes mellitus with diabetic nephropathy. Metformin is on hold given the recent IV contrast. We'll continue sliding scale NovoLog and add Lantus as his blood glucose is increasing with the prednisone. His hemoglobin A1c was 6.7. Stage III chronic kidney disease. His creatinine has improved from baseline. Continue to monitor. Hypothyroidism. Currently stable on Synthroid. His TSH is within normal limits at 3.1. Wheezes, secondary to upper airway reactive airways or acute bronchitis. His bronchospasms did decrease, but has increased following IV metoprolol given and the start of amiodarone. Will restart Xopenex scheduled. We'll increase prednisone to 40 mg twice a day. We'll continue Levaquin empirically. Continue supportive treatment with guaifenesin.  Out of bed to the recliner today.    Time spent: 30 minutes    [facility] Pager [phone]. If 7PM-7AM, please contact night-coverage at [email or web address], password [facility] 12/06/2013, 10:17 AM  LOS: 5 days

## 2013-12-06 NOTE — Progress Notes (Signed)
ANTICOAGULATION CONSULT NOTE - Initial Consult  Pharmacy Consult for Coumadin Indication: atrial fibrillation  No Known Allergies  Patient Measurements: Height: 6' (182.9 cm) Weight: 240 lb 4.8 oz (109 kg) IBW/kg (Calculated) : 77.6  Vital Signs: Temp: 97.5 F (36.4 C) (08/24 0730) Temp src: Oral (08/24 0730) BP: 125/86 mmHg (08/24 0800) Pulse Rate: 125 (08/24 0800)  Labs:  Recent Labs  12/04/13 0517  12/05/13 0010 12/05/13 0553 12/05/13 1152 12/06/13 0022 12/06/13 0450  HGB 12.6*  --   --  12.8*  --   --  13.0  HCT 36.3*  --   --  37.3*  --   --  39.3  PLT 113*  < > 121* 129* 149* 144* 151  APTT  --   < > 55*  --  59* 49*  --   LABPROT 32.0*  < > 29.1* 27.4* 26.3* 23.8* 22.5*  INR 3.11*  < > 2.75* 2.55* 2.42* 2.13* 1.98*  CREATININE 0.78  --   --  0.79  --   --  0.89  < > = values in this interval not displayed.  Estimated Creatinine Clearance: 85.9 ml/min (by C-G formula based on Cr of 0.89).   Medical History: Past Medical History  Diagnosis Date  . Diabetes mellitus   . Hypertension   . Atrial fibrillation     On coumadin  . Chronic kidney disease (CKD), stage III (moderate)   . Hypothyroidism   . Pulmonary hypertension 12/03/2013    PA pressure 56. Ejection fraction 55-60%    Medications:  Prescriptions prior to admission  Medication Sig Dispense Refill  . aspirin 81 MG chewable tablet Chew 81 mg by mouth every other day.      . diltiazem (TIAZAC) 240 MG 24 hr capsule Take 240 mg by mouth daily.      . diphenhydrAMINE (BENADRYL) 25 MG tablet Take 25 mg by mouth every 6 (six) hours as needed. For sleep      . doxazosin (CARDURA) 2 MG tablet Take 2 mg by mouth at bedtime.      Marland Kitchen levothyroxine (SYNTHROID, LEVOTHROID) 50 MCG tablet Take 50 mcg by mouth daily.      Marland Kitchen lisinopril-hydrochlorothiazide (PRINZIDE,ZESTORETIC) 20-25 MG per tablet Take 1 tablet by mouth daily.      . metFORMIN (GLUCOPHAGE) 500 MG tablet Take 1,000 mg by mouth at bedtime.      .  metoprolol succinate (TOPROL-XL) 25 MG 24 hr tablet Take 1 tablet (25 mg total) by mouth daily.  30 tablet  0  . pravastatin (PRAVACHOL) 40 MG tablet Take 80 mg by mouth daily.      Marland Kitchen warfarin (COUMADIN) 3 MG tablet Take 3 mg by mouth daily.        Assessment: 80 yo M admitted with gallstone pancreatitis on 8/19.   He is on chronic Coumadin for Afib.  Home dose listed above.  INR was elevated on admission & Coumadin was held.  Initial plan was for laproscopic cholecystectomy & INR was trending down, however this procedure is being delayed until acute cardiology issues resolved.   He is currently in Afib.  Amiodarone infusion was started 8/23.  If this medication is continued long-term, his warfarin requirements will decrease ~50%.   INR has trended below goal range today & Coumadin is being resumed.   No bleeding noted.   Goal of Therapy:  INR 2-3   Plan:  Coumadin 3mg  po x1 today INR daily  Biagio Borg 12/06/2013,8:22 AM

## 2013-12-06 NOTE — Progress Notes (Signed)
UR chart review completed.  

## 2013-12-06 NOTE — Consult Note (Signed)
CARDIOLOGY CONSULT NOTE   Patient ID: Brett [name] MRN: [id number] DOB/AGE: 1933-02-11 80 y.o.  Admit Date: 12/01/2013 Referring Physician: PTH Primary Physician: [name], MD Consulting Cardiologist: Ardean Simonich MD Primary Cardiologist:  May, Terrence ([facility], [facility])  Reason for Consultation: Atrial fib with RVR  Clinical Summary [name] is a 80 y.o.male with known history of diabetes, atrial fibrillation on coumadin, (10 years),.COPD, hypertension, hypothyroidism, hyperlipidemia, admitted with cholelithiasis with bile duct stone, and acute pancreatitis. He was found to be in rapid atrial fibrillation on admission, with institution of diltazem gtt with difficult HR control,. He was subsequently placed on amiodarone gtt and diltiazem was discontinued. He was also given one dose of digoxin IV which caused pauses. Coumadin was placed on hold in anticipation of cholecystectomy.   He was found by surgery not to require immediate intervention. Since he became asymptomatic, he was planned for elective cholecystectomy. Coumadin has been restarted. He had been given IV fluids at high rate for 2 days as he was not eating or drinking with nausea.   Due to continued HR elevation, He was given IV metoprolol 5 mg X 3 overnight and started on po metoprolol with increased wheezing and mild CHF. CXR demonstrated mild interstitial edema.   Lasix  20 mg was given with diureses of 1 iter. Wheezing continues. He has been started on Xopenex, steroids and antibiotic therapy by Hospitalist service. He remains on amiodarone at 30 mg/hour with HR between 120 and 130 bpm. TSH 3.130.  He is normally followed by Dr. Celesta Gentile May, cardiologist in [place], [place] and is seen every 6 months. States he had cardiac cath 10 years ago, which he does not remember the results of, but states he did not require intervention. He has been treated for atria fib for 10 years, and is followed by Dr.Bluth  for coumadin dosing. He has recently moved to [place], [place] and wants to find a local cardiology practice.  He is currently asymptomatic.    No Known Allergies  Medications Scheduled Medications: . atorvastatin  20 mg Oral q1800  . doxazosin  2 mg Oral QHS  . insulin aspart  0-15 Units Subcutaneous TID WC  . insulin aspart  0-5 Units Subcutaneous QHS  . insulin glargine  15 Units Subcutaneous QHS  . levofloxacin  500 mg Oral Daily  . levothyroxine  50 mcg Oral QAC breakfast  . lisinopril  20 mg Oral Daily  . [START ON 12/07/2013] metoprolol succinate  25 mg Oral Daily  . polyethylene glycol  17 g Oral Daily  . potassium chloride  20 mEq Oral Daily  . predniSONE  20 mg Oral Q supper  . senna-docusate  1 tablet Oral QHS  . sodium chloride  500 mL Intravenous Once  . sodium chloride  3 mL Intravenous Q12H  . warfarin  3 mg Oral Once  . [START ON 12/07/2013] Warfarin - Pharmacist Dosing Inpatient   Does not apply Q24H    Infusions: . diltiazem (CARDIZEM) infusion 5 mg/hr (12/06/13 0949)    PRN Medications: acetaminophen, acetaminophen, alum & mag hydroxide-simeth, bisacodyl, docusate sodium, levalbuterol, LORazepam, morphine injection, ondansetron (ZOFRAN) IV, ondansetron, polyethylene glycol   Past Medical History  Diagnosis Date  . Type 2 diabetes mellitus   . Essential hypertension, benign   . Atrial fibrillation     On coumadin  . Chronic kidney disease (CKD), stage III (moderate)   . Hypothyroidism   . Pulmonary hypertension 12/03/2013    PA pressure 56.  Ejection fraction 55-60%  . History of cardiac catheterization     Details not certain - reportedly no interventions    Past Surgical History  Procedure Laterality Date  . Replacement total hip w/  resurfacing implants Bilateral   . Colonoscopy      Remote > 10 years ago    Family History  Problem Relation Age of Onset  . Colon cancer Neg Hx     Social History [name] reports that he has quit smoking. His  smoking use included Cigarettes. He smoked 0.00 packs per day. He does not have any smokeless tobacco history on file. [name] reports that he drinks alcohol.  Review of Systems Other systems reviewed and negative except as outlined.  Physical Examination Blood pressure 136/94, pulse 125, temperature 97.5 F (36.4 C), temperature source Oral, resp. rate 22, height 6' (1.829 m), weight 240 lb 4.8 oz (109 kg), SpO2 97.00%.  Intake/Output Summary (Last 24 hours) at 12/06/13 1002 Last data filed at 12/06/13 0803  Gross per 24 hour  Intake    742 ml  Output   2675 ml  Net  -1933 ml    Telemetry:Atrial fib with rates betweeen 120 and 130 bpm.  GEN: No acute distress  HEENT: Conjunctiva and lids normal, oropharynx clear with moist mucosa. Neck: Supple, no elevated JVP or carotid bruits, no thyromegaly. Lungs: Inspiratory and expiratory wheezing. Cardiac: Iregular rate and rhythm, no S3 or significant systolic murmur, no pericardial rub. Abdomen: Soft, nontender, no hepatomegaly, bowel sounds present, no guarding or rebound. Extremities: Non pitting edema is noted in LE bilaterally, distal pulses 2+. Skin: Warm and dry. Musculoskeletal: No kyphosis. Neuropsychiatric: Alert and oriented x3, affect grossly appropriate.   Prior Cardiac Testing/Procedures 1.Echocardiogram: 12/02/2013 Left ventricle: The cavity size was normal. Wall thickness was increased in a pattern of moderate LVH. Systolic function was normal. The estimated ejection fraction was in the range of 55% to 60%. Cannot exclude hypokinesis of the basalinferolateral myocardium. The study is not technically sufficient to allow evaluation of LV diastolic function. - Aortic valve: Mildly calcified annulus. Trileaflet. There was mild regurgitation. Mean gradient (S): 4 mm Hg. - Aortic root: The aortic root was mildly ectatic. - Mitral valve: Calcified annulus. There was trivial regurgitation. - Left atrium: The atrium was  at the upper limits of normal in size. - Right ventricle: The cavity size was mildly to moderately dilated. Systolic function was normal. - Right atrium: The atrium was moderately dilated. Central venous pressure (est): 15 mm Hg. - Atrial septum: No defect or patent foramen ovale was identified. - Tricuspid valve: There was mild regurgitation. - Pulmonary arteries: PA peak pressure: 56 mm Hg (S). - Pericardium, extracardiac: There was no pericardial effusion.   Lab Results  Basic Metabolic Panel:  Recent Labs Lab 12/02/13 0548 12/03/13 0530 12/04/13 0517 12/05/13 0553 12/06/13 0450  NA 141 138 136* 137 136*  K 3.5* 3.8 4.0 4.3 3.8  CL 104 100 99 100 98  CO2 25 24 26 25 30   GLUCOSE 132* 194* 197* 200* 198*  BUN 23 19 14 13 17   CREATININE 1.24 1.10 0.78 0.79 0.89  CALCIUM 8.1* 8.2* 8.1* 8.3* 8.3*    Liver Function Tests:  Recent Labs Lab 12/01/13 0624 12/02/13 0548 12/03/13 0530 12/04/13 0517 12/06/13 0450  AST 592* 285* 96* 39* 33  ALT 365* 446* 278* 177* 115*  ALKPHOS 134* 136* 140* 130* 142*  BILITOT 2.5* 4.0* 3.3* 2.0* 0.9  PROT 6.4 6.4 6.5 6.1  5.8*  ALBUMIN 3.7 3.4* 3.3* 2.9* 2.7*    CBC:  Recent Labs Lab 12/01/13 0108  12/02/13 0548 12/03/13 0530  12/04/13 0517  12/05/13 0010 12/05/13 0553 12/05/13 1152 12/06/13 0022 12/06/13 0450  WBC 13.8*  < > 10.8* 9.1  --  5.6  --   --  5.3  --   --  5.7  NEUTROABS 10.3*  --   --   --   --   --   --   --   --   --   --   --   HGB 14.5  < > 13.1 12.7*  --  12.6*  --   --  12.8*  --   --  13.0  HCT 41.7  < > 38.9* 38.1*  --  36.3*  --   --  37.3*  --   --  39.3  MCV 88.3  < > 90.5 90.7  --  88.3  --   --  88.8  --   --  90.3  PLT 204  < > 126* 108*  < > 113*  < > 121* 129* 149* 144* 151  < > = values in this interval not displayed.   Cardiac Enzymes: None    BNP: 3,926   Radiology: Dg Chest Port 1 View  12/05/2013   CLINICAL DATA:  Wheezing, history of atrial fibrillation and diabetes  EXAM:  PORTABLE CHEST - 1 VIEW  COMPARISON:  Portable chest x-ray of December 02, 2013  FINDINGS: The lungs are reasonably well inflated. There is no focal infiltrate. The interstitial markings are coarse and are more conspicuous today. There is stable calcified nodularity over the lateral aspect of the left sixth rib. The cardiac silhouette is mildly enlarged but stable. The central pulmonary vascularity is prominent without cephalization.  IMPRESSION: There has been slight interval increase in the conspicuity of the pulmonary interstitium which may reflect mild interstitial edema. A followup PA and lateral chest film would be useful when the patient can tolerate the procedure.   Electronically Signed   By: David  Martinique   On: 12/05/2013 08:47    ZYS:AYTKZS fibrillation rate of 140 bpm with PVC's   Impression and Recommendations  1.Atrial fib with RVR:   Long standing history of atrial fib treated as OP with rate control and coumadin. Had been on diltiazem 240 mg daily and metoprolol XL daily. He was well controlled until admission. Rate likely related to acute illness in the setting of cholecystis.  Amiodarone gtt now at 30 mg/hr with rate uncontrolled. He continues on metoprolol 50 mg po. There is a concern for increased wheezing, and need to continue metoprolol.  He states he did not do well on amiodarone in the past per his recollection along with cardiologist recommendations.  I will stop amiodarone gtt due to increased wheezing and no improvement in HR, and restart IV diltiazem. I will give one bolus of 10 mg and begin drip at 10 mg/hour. He may need up-titration of diltiazem to 360 mg daily for better HR control. Leave him on metoprolol at 25 mg daily. Replete potassium to keep level of 4.0. Continue coumadin per pharmacy.   Will request records from [facility], cardiologist for recent testing and documentation of results.   2.COPD: Wheezes are noted, but not new. He is now on prednisone and Xopenex along  with antibiotics. This can can also contribute to HR elevation.   3. Acute Cholecystitis:  No planned surgery this admission. His LFT's and  symptoms are improved. He is eating and drinking.   4, Diabetes: Not well controlled currently. Dosing of insulin per PTH   Signed: Phill Myron. Lawrence NP  12/06/2013, 10:02 AM Co-Sign MD   Attending note:  Patient seen and examined. Reviewed available records and modified above note by Ms. Lawrence NP. Mr. Haberland presents with biliary pancreatitis, now improving on conservative measures, not in need of surgery at this time. Cardiac history includes chronic atrial fibrillation, managed with Toprol-XL and Cardizem CD as an outpatient, anticoagulation with Coumadin. He has been followed by a cardiologist in [facility], however planning to establish locally. He does not report any history of obstructive CAD or prior coronary intervention. During hospital stay and acute illness, heart rate has been increased and atrial fibrillation. He has been transiently on intravenous diltiazem, more recently intravenous amiodarone, continuation of oral beta blocker. We are asked to assist with his management.  Echocardiogram done during this hospitalization revealed moderate LVH with LVEF 55-60%, possible basal inferolateral hypokinesis, and determine diastolic function, upper normal left atrial chamber size, mildly sclerotic aortic valve with mild aortic regurgitation, moderate RV dilatation with normal contraction, PASP 56 mmHg with increased CVP.  His most recent chest x-ray shows mild interstitial edema. Heart rate has not been well controlled recently. INR is 1.9.  I agree with discontinuation of amiodarone, switch back to IV diltiazem and titrate for heart rate control. Continue beta blocker as well. Hopefully we can get him back on similar regimen to what he was using previously as an outpatient, perhaps with some modification in dose as needed. Coumadin per  Pharmacy otherwise. Do not anticipate any ischemic workup at this time. He does seem to have a component of diastolic heart failure in the setting of RVR as well as IV fluids. He has had intake exceeding output for the last several days until yesterday when given single dose of Lasix. He may need further standing diuretic. We will follow with you.  Satira Sark, M.D., F.A.C.C.

## 2013-12-06 NOTE — Progress Notes (Signed)
Subjective: Patient in bed, sitting up, eating breakfast.  I personally reviewed and went over laboratory results with the patient.  The results are noted within this dictation.  Objective: Vital signs in last 24 hours: Temp:  [97.5 F (36.4 C)-98.3 F (36.8 C)] 97.5 F (36.4 C) (08/24 0730) Pulse Rate:  [25-151] 125 (08/24 0800) Resp:  [16-32] 18 (08/24 0800) BP: (102-153)/(74-124) 125/86 mmHg (08/24 0800) SpO2:  [92 %-98 %] 97 % (08/24 0800) Weight:  [240 lb 4.8 oz (109 kg)] 240 lb 4.8 oz (109 kg) (08/24 0445)  Intake/Output from previous day: 08/23 0800 - 08/24 0759 In: 947.6 [P.O.:350; I.V.:597.6] Out: 2775 [Urine:2775] Intake/Output this shift: Total I/O In: -  Out: 200 [Urine:200]  General appearance: alert, cooperative, appears stated age, mild distress and smiling  Lab Results:   Recent Labs  12/05/13 0553  12/06/13 0022 12/06/13 0450  WBC 5.3  --   --  5.7  HGB 12.8*  --   --  13.0  HCT 37.3*  --   --  39.3  PLT 129*  < > 144* 151  < > = values in this interval not displayed. BMET  Recent Labs  12/05/13 0553 12/06/13 0450  NA 137 136*  K 4.3 3.8  CL 100 98  CO2 25 30  GLUCOSE 200* 198*  BUN 13 17  CREATININE 0.79 0.89  CALCIUM 8.3* 8.3*    Studies/Results: Dg Chest Port 1 View  12/05/2013   CLINICAL DATA:  Wheezing, history of atrial fibrillation and diabetes  EXAM: PORTABLE CHEST - 1 VIEW  COMPARISON:  Portable chest x-ray of December 02, 2013  FINDINGS: The lungs are reasonably well inflated. There is no focal infiltrate. The interstitial markings are coarse and are more conspicuous today. There is stable calcified nodularity over the lateral aspect of the left sixth rib. The cardiac silhouette is mildly enlarged but stable. The central pulmonary vascularity is prominent without cephalization.  IMPRESSION: There has been slight interval increase in the conspicuity of the pulmonary interstitium which may reflect mild interstitial edema. A followup PA  and lateral chest film would be useful when the patient can tolerate the procedure.   Electronically Signed   By: David  Martinique   On: 12/05/2013 08:47    Medications: I have reviewed the patient's current medications.  Assessment/Plan: 1. Thrombocytopenia, resolved.  Suspect low-grade DIC from pancreatitis with an episode of Hypotension on 8/19.  D/C DIC panel every 12 hours.  2. Chronic atrial fibrillation, on chronic Coumadin therapy. 3. Stage III chronic kidney disease. 4. Hematology will sign off at this time.  Please re-consult if needed.  Patient and plan discussed with Dr. Farrel Gobble and he is in agreement with the aforementioned.      LOS: 5 days    KEFALAS,THOMAS 12/06/2013

## 2013-12-06 NOTE — Plan of Care (Signed)
Problem: Phase III Progression Outcomes Goal: Tolerating diet PT TOLERATING DIET WELL.

## 2013-12-07 LAB — GLUCOSE, CAPILLARY
GLUCOSE-CAPILLARY: 231 mg/dL — AB (ref 70–99)
Glucose-Capillary: 240 mg/dL — ABNORMAL HIGH (ref 70–99)
Glucose-Capillary: 254 mg/dL — ABNORMAL HIGH (ref 70–99)
Glucose-Capillary: 116 mg/dL — ABNORMAL HIGH (ref 70–99)

## 2013-12-07 LAB — BASIC METABOLIC PANEL
ANION GAP: 10 (ref 5–15)
BUN: 17 mg/dL (ref 6–23)
CO2: 30 mEq/L (ref 19–32)
Calcium: 8.8 mg/dL (ref 8.4–10.5)
Chloride: 97 mEq/L (ref 96–112)
Creatinine, Ser: 0.84 mg/dL (ref 0.50–1.35)
GFR calc non Af Amer: 81 mL/min — ABNORMAL LOW (ref >90)
Glucose, Bld: 227 mg/dL — ABNORMAL HIGH (ref 70–99)
POTASSIUM: 4 meq/L (ref 3.7–5.3)
SODIUM: 137 meq/L (ref 137–147)

## 2013-12-07 LAB — PROTIME-INR
INR: 1.77 — AB (ref 0.00–1.49)
PROTHROMBIN TIME: 20.6 s — AB (ref 11.6–15.2)

## 2013-12-07 LAB — PLATELET AB, INDIRECT, IGG

## 2013-12-07 MED ORDER — INSULIN GLARGINE 100 UNIT/ML ~~LOC~~ SOLN
20.0000 [IU] | Freq: Every day | SUBCUTANEOUS | Status: DC
  Administered 2013-12-07 – 2013-12-10 (×4): 20 [IU] via SUBCUTANEOUS
  Filled 2013-12-07 (×6): qty 0.2

## 2013-12-07 MED ORDER — WARFARIN SODIUM 2 MG PO TABS
3.0000 mg | ORAL_TABLET | Freq: Once | ORAL | Status: AC
  Administered 2013-12-07: 3 mg via ORAL
  Filled 2013-12-07 (×2): qty 1

## 2013-12-07 MED ORDER — LEVALBUTEROL HCL 0.63 MG/3ML IN NEBU
0.6300 mg | INHALATION_SOLUTION | Freq: Three times a day (TID) | RESPIRATORY_TRACT | Status: DC
  Administered 2013-12-07 – 2013-12-09 (×5): 0.63 mg via RESPIRATORY_TRACT
  Filled 2013-12-07 (×6): qty 3

## 2013-12-07 MED ORDER — METFORMIN HCL 500 MG PO TABS
500.0000 mg | ORAL_TABLET | Freq: Two times a day (BID) | ORAL | Status: DC
  Administered 2013-12-07 – 2013-12-11 (×9): 500 mg via ORAL
  Filled 2013-12-07 (×9): qty 1

## 2013-12-07 MED ORDER — METOPROLOL SUCCINATE ER 50 MG PO TB24
50.0000 mg | ORAL_TABLET | Freq: Every day | ORAL | Status: DC
  Administered 2013-12-08: 50 mg via ORAL
  Filled 2013-12-07: qty 1

## 2013-12-07 MED ORDER — METOPROLOL SUCCINATE ER 25 MG PO TB24
25.0000 mg | ORAL_TABLET | Freq: Every day | ORAL | Status: AC
  Administered 2013-12-07: 25 mg via ORAL
  Filled 2013-12-07: qty 1

## 2013-12-07 MED ORDER — PREDNISONE 20 MG PO TABS
30.0000 mg | ORAL_TABLET | Freq: Two times a day (BID) | ORAL | Status: DC
  Administered 2013-12-07 – 2013-12-09 (×4): 30 mg via ORAL
  Filled 2013-12-07 (×8): qty 1

## 2013-12-07 MED ORDER — LORAZEPAM 0.5 MG PO TABS
2.0000 mg | ORAL_TABLET | Freq: Every evening | ORAL | Status: DC | PRN
  Administered 2013-12-07 – 2013-12-10 (×4): 2 mg via ORAL
  Filled 2013-12-07 (×4): qty 4

## 2013-12-07 NOTE — Progress Notes (Signed)
Primary cardiologist: Dr. Izora Gala May ([facility], [facility]) Consulting cardiologist: Dr. Satira Sark  Subjective:   No chest pain. Some upper airway congestion. Tolerated oral intake yesterday. No abdominal pain.   Objective:   Temp:  [97.6 F (36.4 C)-98.6 F (37 C)] 97.9 F (36.6 C) (08/25 0730) Pulse Rate:  [26-153] 80 (08/25 0815) Resp:  [13-31] 25 (08/25 0830) BP: (106-138)/(68-104) 127/71 mmHg (08/25 0830) SpO2:  [90 %-97 %] 94 % (08/25 0815) Weight:  [240 lb 1.3 oz (108.9 kg)] 240 lb 1.3 oz (108.9 kg) (08/25 0500) Last BM Date: 12/05/13  Filed Weights   12/05/13 0500 12/06/13 0445 12/07/13 0500  Weight: 240 lb 15.4 oz (109.3 kg) 240 lb 4.8 oz (109 kg) 240 lb 1.3 oz (108.9 kg)    Intake/Output Summary (Last 24 hours) at 12/07/13 0952 Last data filed at 12/07/13 0800  Gross per 24 hour  Intake  765.5 ml  Output   1445 ml  Net -679.5 ml    Telemetry: Atrial fibrillation with RVR.  Exam:  General: Appears comfortable  Lungs: No active wheezes.  Cardiac: Distant, irregularly irregular.  Abdomen: NABS, nontender.  Extremities: No pitting.   Lab Results:  Basic Metabolic Panel:  Recent Labs Lab 12/05/13 0553 12/06/13 0450 12/07/13 0446  NA 137 136* 137  K 4.3 3.8 4.0  CL 100 98 97  CO2 25 30 30   GLUCOSE 200* 198* 227*  BUN 13 17 17   CREATININE 0.79 0.89 0.84  CALCIUM 8.3* 8.3* 8.8    Liver Function Tests:  Recent Labs Lab 12/03/13 0530 12/04/13 0517 12/06/13 0450  AST 96* 39* 33  ALT 278* 177* 115*  ALKPHOS 140* 130* 142*  BILITOT 3.3* 2.0* 0.9  PROT 6.5 6.1 5.8*  ALBUMIN 3.3* 2.9* 2.7*    CBC:  Recent Labs Lab 12/04/13 0517  12/05/13 0553 12/05/13 1152 12/06/13 0022 12/06/13 0450  WBC 5.6  --  5.3  --   --  5.7  HGB 12.6*  --  12.8*  --   --  13.0  HCT 36.3*  --  37.3*  --   --  39.3  MCV 88.3  --  88.8  --   --  90.3  PLT 113*  < > 129* 149* 144* 151  < > = values in this interval not  displayed.   Coagulation:  Recent Labs Lab 12/06/13 0022 12/06/13 0450 12/07/13 0446  INR 2.13* 1.98* 1.77*     Medications:   Scheduled Medications: . atorvastatin  20 mg Oral q1800  . doxazosin  2 mg Oral QHS  . insulin aspart  0-15 Units Subcutaneous TID WC  . insulin aspart  0-5 Units Subcutaneous QHS  . insulin glargine  20 Units Subcutaneous QHS  . levalbuterol  0.63 mg Nebulization TID  . levofloxacin  500 mg Oral Daily  . levothyroxine  50 mcg Oral QAC breakfast  . lisinopril  20 mg Oral Daily  . metFORMIN  500 mg Oral BID WC  . metoprolol succinate  25 mg Oral Daily  . polyethylene glycol  17 g Oral Daily  . potassium chloride  20 mEq Oral Daily  . predniSONE  30 mg Oral BID WC  . senna-docusate  1 tablet Oral QHS  . sodium chloride  500 mL Intravenous Once  . sodium chloride  3 mL Intravenous Q12H  . Warfarin - Pharmacist Dosing Inpatient   Does not apply Q24H     Infusions: . diltiazem (CARDIZEM) infusion  15 mg/hr (12/07/13 0800)     PRN Medications:  acetaminophen, acetaminophen, alum & mag hydroxide-simeth, bisacodyl, docusate sodium, LORazepam, morphine injection, ondansetron (ZOFRAN) IV, ondansetron, polyethylene glycol   Assessment:   1.  Atrial fibrillation with RVR, reported baseline history of chronic atrial fibrillation treated with combination of beta blocker and calcium channel blocker as an outpatient. Coumadin being followed by pharmacy. Heart rate control is still not obtained with resumption of IV Cardizem yesterday. I reviewed his hospital course, he has been on varying regimens for heart rate control with concerns about intermittent wheezing and also short pauses. Initially his heart rate was controlled, and then subsequently increased in the setting of his acute illness. Patient is not entirely clear about his home regimen, although amiodarone 100 mg daily was listed on his history and physical, he does not recall taking this medication  for quite some time.  2. Biliary pancreatitis, clinically improving.  3. History of hypertension, blood pressure stable.  4. CKD., stage III.   5. History of hypothyroidism, TSH normal.   Plan/Discussion:    Discussed with patient and reviewed chart in more detail. Not entirely clear that he was actually taking amiodarone when admitted, and reports a possible intolerance in the past. Therefore would not reinstitute amiodarone, although this is sometimes helpful with heart rate control. He is already on high-dose intravenous diltiazem, the equivalent of 360 mg daily. His breathing seems more comfortable today without active wheezing, would place Toprol-XL at 50 mg daily for now (beta 1 selective).    Satira Sark, M.D., F.A.C.C.

## 2013-12-07 NOTE — Progress Notes (Signed)
[name] HR 50'S TO 70'S IN ATRIAL FIB. CARDIZEM DRIP STOPPED AT THIS TIME

## 2013-12-07 NOTE — Progress Notes (Signed)
[facility] PROGRESS NOTE  Brett [name] WVP:[id number] DOB: 1932-08-06 DOA: 12/01/2013 PCP: [name], MD    Code Status: Full code Family Communication: Discussed with the patient; update with wife pending. Disposition Plan: Discharge when clinically appropriate.   Consultants:  Gastroenterology  General surgery  Cardiology  Procedures: 2-D echocardiogram 8/20:   Impressions: - Moderate LVH with LVEF 55-60%, cannot exclude basal inferolateral hypokinesis. Indeterminate diastolic function. Upper normal left atrial chamber size. MAC with trivial mitral regurgitation Mildly ectatic aortic root. Mildly sclerotic aortic valve with mild aortic regurgitation. Moderate RV chamber dilatation with normal contraction. Elevated PASP 56 mm mercury with increased estimated CVP.   MRCP abdomen 12/01/13  CT abdomen 12/01/13  Antibiotics:  Levaquin 12/03/13>>  HPI/Subjective:  Brief history: The patient is a 80 year old man with a history of chronic atrial fibrillation-on Coumadin, diabetes mellitus, and hypertension, who presented to the emergency department on 12/01/2013 with a chief complaint of abdominal pain. On admission, his lab data were significant for a lipase of 939, AST of 83, and total bilirubin of 1.3. CT of his gallbladder revealed cholelithiasis, gallbladder distention, and a 4 mm mid to distal common bile duct stone. Gastroenterology was consulted. ERCP was considered, but the patient was coagulopathic from Coumadin. Therefore, an MRCP was ordered and it did not reveal a common bile duct stone; the suspicion was that the stone had passed. Cholecystectomy was recommended and general surgery was consulted. The plan was to perform a laparoscopic cholecystectomy when his INR was in an acceptable range, however, the patient developed rapid atrial fibrillation. [name] has deferred the surgery to an elective status. He recommends that the patient's elective  cholecystectomy be done at a tertiary care center given his comorbid conditions. Patient was instructed to discuss a referral to Gen. surgery in [place] with his primary care physician [name]. The patient's lipase has normalized and his liver transaminases have nearly normalized. His rapid atrial fibrillation has been difficult to manage as he was started on a diltiazem drip for several days. A couple doses of digoxin was given, but he developed sinus pauses. Following a curbside consult with [name] over the weekend, amiodarone drip was started. However, it was discontinued by [name] because the patient had a history of feeling bad while on it in the past. It was also discontinued as the patient appeared to be wheezing more. Diltiazem drip was restarted. Of note, his 2-D echocardiogram revealed preserved left ventricular systolic function. Coumadin was restarted on 8/24. The patient's platelet count also decreased. Hematology was consulted and believed that the patient had low-grade DIC. His platelet count has normalized. He can be discharged to home when his atrial fibrillation is under better control.    Subjective: The patient has no complaints of chest pain or shortness of breath currently. He denies abdominal pain, nausea, or vomiting. He had a bowel movement this morning.  Objective: Filed Vitals:   12/07/13 0830  BP: 127/71  Pulse:   Temp:   Resp: 25   temperature 97.9. Oxygen saturation 94% on room air. Pulse rate 125-136 on the monitor.  Intake/Output Summary (Last 24 hours) at 12/07/13 0906 Last data filed at 12/07/13 0800  Gross per 24 hour  Intake 861.33 ml  Output   1445 ml  Net -583.67 ml   Filed Weights   12/05/13 0500 12/06/13 0445 12/07/13 0500  Weight: 109.3 kg (240 lb 15.4 oz) 109 kg (240 lb 4.8 oz) 108.9 kg (240 lb 1.3 oz)  Exam:   General:  Morbidly obese 80 year old sitting up in bed, in no acute distress.  Cardiovascular: Irregular,  irregular with tachycardia. No pedal edema.  Respiratory: Significantly fewer wheezes on exam with occasional wheezes noted.  Abdomen: Obese abdomen, positive bowel sounds, soft, nontender nondistended.  Musculoskeletal: No acute hot red joints.   Neurologic: He is alert and oriented x3. Cranial nerves II through XII are grossly intact.  Data Reviewed: Basic Metabolic Panel:  Recent Labs Lab 12/03/13 0530 12/04/13 0517 12/05/13 0553 12/06/13 0450 12/07/13 0446  NA 138 136* 137 136* 137  K 3.8 4.0 4.3 3.8 4.0  CL 100 99 100 98 97  CO2 24 26 25 30 30   GLUCOSE 194* 197* 200* 198* 227*  BUN 19 14 13 17 17   CREATININE 1.10 0.78 0.79 0.89 0.84  CALCIUM 8.2* 8.1* 8.3* 8.3* 8.8   Liver Function Tests:  Recent Labs Lab 12/01/13 0624 12/02/13 0548 12/03/13 0530 12/04/13 0517 12/06/13 0450  AST 592* 285* 96* 39* 33  ALT 365* 446* 278* 177* 115*  ALKPHOS 134* 136* 140* 130* 142*  BILITOT 2.5* 4.0* 3.3* 2.0* 0.9  PROT 6.4 6.4 6.5 6.1 5.8*  ALBUMIN 3.7 3.4* 3.3* 2.9* 2.7*    Recent Labs Lab 12/01/13 0108 12/01/13 0624 12/02/13 0548 12/04/13 0517  LIPASE 939* 582* 152* 27   No results found for this basename: AMMONIA,  in the last 168 hours CBC:  Recent Labs Lab 12/01/13 0108  12/02/13 0548 12/03/13 0530  12/04/13 0517  12/05/13 0010 12/05/13 0553 12/05/13 1152 12/06/13 0022 12/06/13 0450  WBC 13.8*  < > 10.8* 9.1  --  5.6  --   --  5.3  --   --  5.7  NEUTROABS 10.3*  --   --   --   --   --   --   --   --   --   --   --   HGB 14.5  < > 13.1 12.7*  --  12.6*  --   --  12.8*  --   --  13.0  HCT 41.7  < > 38.9* 38.1*  --  36.3*  --   --  37.3*  --   --  39.3  MCV 88.3  < > 90.5 90.7  --  88.3  --   --  88.8  --   --  90.3  PLT 204  < > 126* 108*  < > 113*  < > 121* 129* 149* 144* 151  < > = values in this interval not displayed. Cardiac Enzymes: No results found for this basename: CKTOTAL, CKMB, CKMBINDEX, TROPONINI,  in the last 168 hours BNP (last 3  results)  Recent Labs  12/02/13 1212  PROBNP 3926.0*   CBG:  Recent Labs Lab 12/06/13 0735 12/06/13 1148 12/06/13 1636 12/06/13 2132 12/07/13 0736  GLUCAP 151* 158* 196* 240* 231*    Recent Results (from the past 240 hour(s))  MRSA PCR SCREENING     Status: None   Collection Time    12/03/13  6:55 PM      Result Value Ref Range Status   MRSA by PCR NEGATIVE  NEGATIVE Final   Comment:            The GeneXpert MRSA Assay (FDA     approved for NASAL specimens     only), is one component of a     comprehensive MRSA colonization     surveillance program. It is not  intended to diagnose MRSA     infection nor to guide or     monitor treatment for     MRSA infections.     Studies: No results found.  Scheduled Meds: . atorvastatin  20 mg Oral q1800  . doxazosin  2 mg Oral QHS  . insulin aspart  0-15 Units Subcutaneous TID WC  . insulin aspart  0-5 Units Subcutaneous QHS  . insulin glargine  15 Units Subcutaneous QHS  . levalbuterol  0.63 mg Nebulization TID  . levofloxacin  500 mg Oral Daily  . levothyroxine  50 mcg Oral QAC breakfast  . lisinopril  20 mg Oral Daily  . metFORMIN  500 mg Oral BID WC  . metoprolol succinate  25 mg Oral Daily  . polyethylene glycol  17 g Oral Daily  . potassium chloride  20 mEq Oral Daily  . predniSONE  40 mg Oral BID WC  . senna-docusate  1 tablet Oral QHS  . sodium chloride  500 mL Intravenous Once  . sodium chloride  3 mL Intravenous Q12H  . Warfarin - Pharmacist Dosing Inpatient   Does not apply Q24H   Continuous Infusions: . diltiazem (CARDIZEM) infusion 15 mg/hr (12/07/13 0800)   Assessment and plan: Principal Problem:   Pancreatitis due to biliary obstruction Active Problems:   Choledocholithiasis   Cholelithiasis   Atrial fibrillation with RVR   Transaminitis   Thrombocytopenia, unspecified   Acute bronchitis   Type 2 diabetes mellitus with diabetic nephropathy   Hypertension   Chronic kidney disease (CKD),  stage III (moderate)   Hypothyroidism   Obesity, morbid   Pulmonary hypertension   DIC (disseminated intravascular coagulation)    1. Acute biliary pancreatitis with CT evidence of common bile duct stone on admission/cholelithiasis/choledocholithiasis. CT on admission revealed a common bile duct stone. GI was consulted. ERCP was considered, but the patient was coagulopathic from Coumadin. Therefore, a MRCP was ordered on 8/19. It did not reveal a common bile duct stone, but it did reveal gallstones in the neck of the bladder; possible passage of distal CBD stone. The patient's lipase, total bilirubin, and liver transaminases have improved-his lipase is now within normal limits. He is symptomatically improved and tolerating his regular diet. GI. He will need a laparoscopic cholecystectomy and [name] planned the operation for this week, but given the patient's cardiac status, he is deferring surgical intervention. He recommends that the surgical intervention be done electively at a tertiary care center. General surgery referral can be done by the patient's primary care physician, [name]. This was discussed with the patient. Transaminitis and hyperbilirubinemia, secondary to #1. The patient's liver transaminases and total bilirubin are now trending downward. Continue to monitor. Chronic atrial fibrillation, with RVR.... ..Marland KitchenResultant sinus pauses after being given 2 doses of digoxin. His rapid rate is likely exacerbated by his acute illness. Possible contribution from [place]. His 2-D echocardiogram revealed ejection fraction of 55-60%. His TSH is within normal limits. He was transferred to the step down unit on the diltiazem drip and continued on oral diltiazem and metoprolol. 2 dose of of digoxin were given with several sinus pauses lasting no more than 2.5 seconds. Following the curbside consult with cardiologist, [name] over the weekend, he recommended discontinuation of the  diltiazem drip and oral diltiazem and starting him on an amiodarone drip. Followup cardiologist recommended discontinuing the amiodarone as the patient was on it in the distant past, but felt bad on it. It was also discontinued because  of worsening wheezing. Diltiazem drip has been restarted. Of note, the patient was given IV metoprolol and a higher dose of metoprolol by the cross cover provider, but this may have also led to increased wheezing. Toprol-XL returned to prehospital dose.  Coumadin restarted per pharmacy on 8/24.Burnis Medin continue to monitor. Flash pulmonary edema possibly secondary to acute diastolic heart failure in the setting of RVR. IV fluids were decreased. He was given a total of 2 IV doses of Lasix with improvement. Continue to monitor for need for oral Lasix. Coagulopathy, secondary to warfarin. His INR had increased to greater than 3, but is now below 2. No signs of bleeding. Coumadin was on hold, but has been restarted. Thrombocytopenia. Now resolved. Etiology possibly secondary to low-grade DIC per hematology who was consulted. Workup reveals DIC panel with no schistocytes; TSH is within normal limits. Hepatitis panel so far, nonreactive. Vitamin B12 level within normal limits. HIV antibody negative. Cardiolipin antibodies and ANA were unremarkable.. His platelet count was 204 on admission and has decreased daily, but it is now trending up. I do not believe he was given heparin as he is already anticoagulated. His spleen on CT scan was apparently "normal "per radiology. He was started on Protonix on admission which can cause thrombocytopenia, so it was discontinued. Currently, there are no signs of bleeding. Pulmonary hypertension, per 2-D echocardiogram. Echo revealed peak pulmonary artery pressure of 56 mm of mercury. He does not appear to have cor pulmonale. Continue to monitor. Hypertension. Currently stable and controlled on lisinopril, diltiazem, and lisinopril. HCTZ is on  hold. Diabetes mellitus with diabetic nephropathy. Metformin was on hold given the recent IV contrast. It will be restarted today. We'll continue sliding scale NovoLog and will increase Lantus as his blood glucose is increasing with the prednisone. His hemoglobin A1c was 6.7. Stage III chronic kidney disease. His creatinine has improved from baseline. Continue to monitor. Hypothyroidism. Currently stable on Synthroid. His TSH is within normal limits at 3.1. Wheezes, secondary to upper airway reactive airways or acute bronchitis. His bronchospasms did decrease, but had increased following IV metoprolol given and the start of amiodarone. Xopenex restarted scheduled.  Prednisone increased to 40 mg twice a day, but will taper down to 30 mg twice a day. We'll continue Levaquin empirically. Continue supportive treatment with guaifenesin. Of note, he has significantly less wheezing on exam today.  Out of bed to the recliner today.    Time spent: 30 minutes    [facility] Pager [phone]. If 7PM-7AM, please contact night-coverage at [email or web address], password [facility] 12/07/2013, 9:06 AM  LOS: 6 days

## 2013-12-07 NOTE — Progress Notes (Signed)
ANTICOAGULATION CONSULT NOTE - follow up  Pharmacy Consult for Coumadin Indication: atrial fibrillation  No Known Allergies  Patient Measurements: Height: 6' (182.9 cm) Weight: 240 lb 1.3 oz (108.9 kg) IBW/kg (Calculated) : 77.6  Vital Signs: Temp: 97.9 F (36.6 C) (08/25 0730) Temp src: Oral (08/25 0400) BP: 110/85 mmHg (08/25 0945) Pulse Rate: 52 (08/25 0945)  Labs:  Recent Labs  12/05/13 0010 12/05/13 0553 12/05/13 1152 12/06/13 0022 12/06/13 0450 12/07/13 0446  HGB  --  12.8*  --   --  13.0  --   HCT  --  37.3*  --   --  39.3  --   PLT 121* 129* 149* 144* 151  --   APTT 55*  --  59* 49*  --   --   LABPROT 29.1* 27.4* 26.3* 23.8* 22.5* 20.6*  INR 2.75* 2.55* 2.42* 2.13* 1.98* 1.77*  CREATININE  --  0.79  --   --  0.89 0.84   Estimated Creatinine Clearance: 90.9 ml/min (by C-G formula based on Cr of 0.84).  Medical History: Past Medical History  Diagnosis Date  . Type 2 diabetes mellitus   . Essential hypertension, benign   . Atrial fibrillation     On coumadin  . Chronic kidney disease (CKD), stage III (moderate)   . Hypothyroidism   . Pulmonary hypertension 12/03/2013    PA pressure 56. Ejection fraction 55-60%  . History of cardiac catheterization     Details not certain - reportedly no interventions   Medications:  Prescriptions prior to admission  Medication Sig Dispense Refill  . aspirin 81 MG chewable tablet Chew 81 mg by mouth every other day.      . diltiazem (TIAZAC) 240 MG 24 hr capsule Take 240 mg by mouth daily.      . diphenhydrAMINE (BENADRYL) 25 MG tablet Take 25 mg by mouth every 6 (six) hours as needed. For sleep      . doxazosin (CARDURA) 2 MG tablet Take 2 mg by mouth at bedtime.      Marland Kitchen levothyroxine (SYNTHROID, LEVOTHROID) 50 MCG tablet Take 50 mcg by mouth daily.      Marland Kitchen lisinopril-hydrochlorothiazide (PRINZIDE,ZESTORETIC) 20-25 MG per tablet Take 1 tablet by mouth daily.      . metFORMIN (GLUCOPHAGE) 500 MG tablet Take 1,000 mg by  mouth at bedtime.      . metoprolol succinate (TOPROL-XL) 25 MG 24 hr tablet Take 1 tablet (25 mg total) by mouth daily.  30 tablet  0  . pravastatin (PRAVACHOL) 40 MG tablet Take 80 mg by mouth daily.      Marland Kitchen warfarin (COUMADIN) 3 MG tablet Take 3 mg by mouth daily.       Assessment: 80 yo M admitted with gallstone pancreatitis on 8/19.   He is on chronic Coumadin for Afib.  Home dose listed above.  INR was elevated on admission & Coumadin was held.  Initial plan was for laproscopic cholecystectomy & INR was trending down, however this procedure is being delayed until acute cardiology issues resolved.  INR continues to trend down.   He is currently in Afib.  Amiodarone infusion was started 8/23.  If this medication is continued long-term, his warfarin requirements will likely decrease ~50%.   INR has trended below goal range & Coumadin is being resumed.   No bleeding noted.   Goal of Therapy:  INR 2-3   Plan:  Coumadin 3mg  po x1 today INR daily  Hart Robinsons A 12/07/2013,11:03 AM

## 2013-12-07 NOTE — Progress Notes (Signed)
ATTEMPTED 2 TO 3 TIME TO ASSIST PT OOB INTO RECLINER. PT REFUSES EACH TIME SAYING HE IS TOO TIRED AND WEAK AND TRY LATER ON TODAY. DR Caryn Section NOTIFIED.

## 2013-12-08 DIAGNOSIS — E1129 Type 2 diabetes mellitus with other diabetic kidney complication: Secondary | ICD-10-CM

## 2013-12-08 DIAGNOSIS — R062 Wheezing: Secondary | ICD-10-CM

## 2013-12-08 DIAGNOSIS — D65 Disseminated intravascular coagulation [defibrination syndrome]: Secondary | ICD-10-CM

## 2013-12-08 DIAGNOSIS — J209 Acute bronchitis, unspecified: Secondary | ICD-10-CM

## 2013-12-08 DIAGNOSIS — E038 Other specified hypothyroidism: Secondary | ICD-10-CM

## 2013-12-08 DIAGNOSIS — N058 Unspecified nephritic syndrome with other morphologic changes: Secondary | ICD-10-CM

## 2013-12-08 LAB — CBC
HCT: 43.4 % (ref 39.0–52.0)
Hemoglobin: 14.7 g/dL (ref 13.0–17.0)
MCH: 30.3 pg (ref 26.0–34.0)
MCHC: 33.9 g/dL (ref 30.0–36.0)
MCV: 89.5 fL (ref 78.0–100.0)
Platelets: 200 K/uL (ref 150–400)
RBC: 4.85 MIL/uL (ref 4.22–5.81)
RDW: 12.9 % (ref 11.5–15.5)
WBC: 9.3 K/uL (ref 4.0–10.5)

## 2013-12-08 LAB — PLATELET ANTIBODIES, DIRECT

## 2013-12-08 LAB — GLUCOSE, CAPILLARY
GLUCOSE-CAPILLARY: 166 mg/dL — AB (ref 70–99)
GLUCOSE-CAPILLARY: 202 mg/dL — AB (ref 70–99)
Glucose-Capillary: 235 mg/dL — ABNORMAL HIGH (ref 70–99)
Glucose-Capillary: 207 mg/dL — ABNORMAL HIGH (ref 70–99)

## 2013-12-08 LAB — PROTIME-INR
INR: 1.93 — ABNORMAL HIGH (ref 0.00–1.49)
Prothrombin Time: 22.1 s — ABNORMAL HIGH (ref 11.6–15.2)

## 2013-12-08 MED ORDER — AMIODARONE HCL IN DEXTROSE 360-4.14 MG/200ML-% IV SOLN
60.0000 mg/h | INTRAVENOUS | Status: AC
  Administered 2013-12-08 (×2): 60 mg/h via INTRAVENOUS
  Filled 2013-12-08 (×2): qty 200

## 2013-12-08 MED ORDER — AMIODARONE LOAD VIA INFUSION
150.0000 mg | Freq: Once | INTRAVENOUS | Status: AC
  Administered 2013-12-08: 150 mg via INTRAVENOUS
  Filled 2013-12-08: qty 83.34

## 2013-12-08 MED ORDER — FUROSEMIDE 10 MG/ML IJ SOLN
40.0000 mg | Freq: Two times a day (BID) | INTRAMUSCULAR | Status: DC
  Administered 2013-12-08 – 2013-12-09 (×2): 40 mg via INTRAVENOUS
  Filled 2013-12-08 (×2): qty 4

## 2013-12-08 MED ORDER — WARFARIN SODIUM 2 MG PO TABS
3.0000 mg | ORAL_TABLET | Freq: Once | ORAL | Status: AC
  Administered 2013-12-08: 3 mg via ORAL
  Filled 2013-12-08 (×2): qty 1

## 2013-12-08 MED ORDER — AMIODARONE HCL IN DEXTROSE 360-4.14 MG/200ML-% IV SOLN
30.0000 mg/h | INTRAVENOUS | Status: DC
Start: 2013-12-08 — End: 2013-12-09
  Filled 2013-12-08: qty 200

## 2013-12-08 NOTE — Progress Notes (Signed)
Subjective:  Heart rate down to 50 briefly last night and Cardizem drip stopped, but heart rate 130's since. Patient feeling "ok"  Objective:  Vital Signs in the last 24 hours: Temp:  [97.3 F (36.3 C)-98 F (36.7 C)] 97.3 F (36.3 C) (08/26 0400) Pulse Rate:  [34-149] 119 (08/26 0715) Resp:  [13-29] 13 (08/26 0715) BP: (94-145)/(54-99) 101/73 mmHg (08/26 0715) SpO2:  [87 %-99 %] 92 % (08/26 0715) Weight:  [242 lb 11.6 oz (110.1 kg)] 242 lb 11.6 oz (110.1 kg) (08/26 0500)  Intake/Output from previous day: 08/25 0701 - 08/26 0700 In: 1272.5 [P.O.:1040; I.V.:232.5] Out: 1400 [Urine:1400] Intake/Output from this shift:    Physical Exam: NECK: Without JVD, HJR, or bruit LUNGS: Decrease breath sounds throughout. HEART: Irregular rate and rhythm, no murmur, gallop, rub, bruit, thrill, or heave EXTREMITIES: plus 1 ankle edema.Without cyanosis, clubbing   Lab Results:  Recent Labs  12/06/13 0450 12/08/13 0427  WBC 5.7 9.3  HGB 13.0 14.7  PLT 151 200    Recent Labs  12/06/13 0450 12/07/13 0446  NA 136* 137  K 3.8 4.0  CL 98 97  CO2 30 30  GLUCOSE 198* 227*  BUN 17 17  CREATININE 0.89 0.84   No results found for this basename: TROPONINI, CK, MB,  in the last 72 hours Hepatic Function Panel  Recent Labs  12/06/13 0450  PROT 5.8*  ALBUMIN 2.7*  AST 33  ALT 115*  ALKPHOS 142*  BILITOT 0.9  BILIDIR 0.3   No results found for this basename: CHOL,  in the last 72 hours No results found for this basename: PROTIME,  in the last 72 hours  Imaging:   Cardiac Studies:.Echocardiogram: 12/02/2013 Left ventricle: The cavity size was normal. Wall thickness was increased in a pattern of moderate LVH. Systolic function was normal. The estimated ejection fraction was in the range of 55% to 60%. Cannot exclude hypokinesis of the basalinferolateral myocardium. The study is not technically sufficient to allow evaluation of LV diastolic function. - Aortic valve: Mildly  calcified annulus. Trileaflet. There was mild regurgitation. Mean gradient (S): 4 mm Hg. - Aortic root: The aortic root was mildly ectatic. - Mitral valve: Calcified annulus. There was trivial regurgitation. - Left atrium: The atrium was at the upper limits of normal in size. - Right ventricle: The cavity size was mildly to moderately dilated. Systolic function was normal. - Right atrium: The atrium was moderately dilated. Central venous pressure (est): 15 mm Hg. - Atrial septum: No defect or patent foramen ovale was identified. - Tricuspid valve: There was mild regurgitation. - Pulmonary arteries: PA peak pressure: 56 mm Hg (S). - Pericardium, extracardiac: There was no pericardial effusion.     Assessment/Plan:  1.  Atrial fibrillation with RVR rate down to 50 briefly last night but back up since. On IV Cardizem 15 mg/hr. Toprol XL 50 mg. Had pauses on Digoxin. Apparantly had similar episode 10 yrs ago requiring cardioversion. Will discuss with MD   2. Biliary pancreatitis, clinically improving.  3. History of hypertension, blood pressure stable.  4. CKD., stage III.   5. History of hypothyroidism, TSH normal.    LOS: 7 days      Ermalinda Barrios 12/08/2013, 9:48 AM

## 2013-12-08 NOTE — Progress Notes (Signed)
Inpatient Diabetes Program Recommendations  AACE/ADA: New Consensus Statement on Inpatient Glycemic Control (2013)  Target Ranges:  Prepandial:   less than 140 mg/dL      Peak postprandial:   less than 180 mg/dL (1-2 hours)      Critically ill patients:  140 - 180 mg/dL    Results for KREIG, PARSON (MRN [id number]) as of 12/08/2013 08:48  Ref. Range 12/07/2013 07:36 12/07/2013 11:37 12/07/2013 16:39 12/07/2013 21:00 12/08/2013 08:22  Glucose-Capillary Latest Range: 70-99 mg/dL 231 (H) 240 (H) 254 (H) 116 (H) 166 (H)   Diabetes history: DM2  Outpatient Diabetes medications: Metformin 1000 mg QHS  Current orders for Inpatient glycemic control: Lantus 20 units QHS, Novolog 0-15 units AC, Novolog 0-5 units HS, Metformin 500 mg BID   Inpatient Diabetes Program Recommendations Insulin - Meal Coverage: While inpatient and ordered steroids, please consider ordering Novolog 5 units TID with meals for meal coverage.   Thanks, Barnie Alderman, RN, MSN, CCRN Diabetes Coordinator Inpatient Diabetes Program [phone] (Team Pager) [phone] ([facility] office) [phone] [facility] office)

## 2013-12-08 NOTE — Progress Notes (Signed)
[facility] PROGRESS NOTE  Brett [name] DTO:[id number] DOB: 1932-12-09 DOA: 12/01/2013 PCP: [name], MD    Code Status: Full code Family Communication: Discussed with the patient Disposition Plan: Discharge when clinically appropriate.   Consultants:  Gastroenterology  General surgery  Cardiology  Procedures: 2-D echocardiogram 8/20:   Impressions: - Moderate LVH with LVEF 55-60%, cannot exclude basal inferolateral hypokinesis. Indeterminate diastolic function. Upper normal left atrial chamber size. MAC with trivial mitral regurgitation Mildly ectatic aortic root. Mildly sclerotic aortic valve with mild aortic regurgitation. Moderate RV chamber dilatation with normal contraction. Elevated PASP 56 mm mercury with increased estimated CVP.   MRCP abdomen 12/01/13  CT abdomen 12/01/13  Antibiotics:  Levaquin 12/03/13>>  HPI/Subjective:  Brief history: The patient is a 80 year old man with a history of chronic atrial fibrillation-on Coumadin, diabetes mellitus, and hypertension, who presented to the emergency department on 12/01/2013 with a chief complaint of abdominal pain. On admission, his lab data were significant for a lipase of 939, AST of 83, and total bilirubin of 1.3. CT of his gallbladder revealed cholelithiasis, gallbladder distention, and a 4 mm mid to distal common bile duct stone. Gastroenterology was consulted. ERCP was considered, but the patient was coagulopathic from Coumadin. Therefore, an MRCP was ordered and it did not reveal a common bile duct stone; the suspicion was that the stone had passed. Cholecystectomy was recommended and general surgery was consulted. The plan was to perform a laparoscopic cholecystectomy when his INR was in an acceptable range, however, the patient developed rapid atrial fibrillation. [name] has deferred the surgery to an elective status. He recommends that the patient's elective cholecystectomy be done at a tertiary  care center given his comorbid conditions. Patient was instructed to discuss a referral to Gen. surgery in [place] with his primary care physician [name]. The patient's lipase has normalized and his liver transaminases have nearly normalized. His rapid atrial fibrillation has been difficult to manage as he was started on a diltiazem drip for several days. A couple doses of digoxin was given, but he developed sinus pauses. Following a curbside consult with [name] over the weekend, amiodarone drip was started. However, it was discontinued by [name] because the patient had a history of feeling bad while on it in the past. It was also discontinued as the patient appeared to be wheezing more. Diltiazem drip was restarted. Of note, his 2-D echocardiogram revealed preserved left ventricular systolic function. Coumadin was restarted on 8/24. The patient's platelet count also decreased. Hematology was consulted and believed that the patient had low-grade DIC. His platelet count has normalized. He can be discharged to home when his atrial fibrillation is under better control.    Subjective: Denies any nausea, vomiting or abdominal pain.  Objective: Filed Vitals:   12/08/13 1100  BP:   Pulse: 136  Temp:   Resp: 26    Intake/Output Summary (Last 24 hours) at 12/08/13 1256 Last data filed at 12/08/13 0600  Gross per 24 hour  Intake  837.5 ml  Output    800 ml  Net   37.5 ml   Filed Weights   12/06/13 0445 12/07/13 0500 12/08/13 0500  Weight: 109 kg (240 lb 4.8 oz) 108.9 kg (240 lb 1.3 oz) 110.1 kg (242 lb 11.6 oz)    Exam:   General:  Morbidly obese 80 year old sitting up in bed, in no acute distress.  Cardiovascular: Irregular, irregular with tachycardia. 1-2+ pedal edema.  Respiratory: CTA B  Abdomen: Obese abdomen, positive  bowel sounds, soft, nontender nondistended.  Musculoskeletal: No acute hot red joints.   Neurologic: He is alert and oriented x3. Cranial nerves  II through XII are grossly intact.  Data Reviewed: Basic Metabolic Panel:  Recent Labs Lab 12/03/13 0530 12/04/13 0517 12/05/13 0553 12/06/13 0450 12/07/13 0446  NA 138 136* 137 136* 137  K 3.8 4.0 4.3 3.8 4.0  CL 100 99 100 98 97  CO2 24 26 25 30 30   GLUCOSE 194* 197* 200* 198* 227*  BUN 19 14 13 17 17   CREATININE 1.10 0.78 0.79 0.89 0.84  CALCIUM 8.2* 8.1* 8.3* 8.3* 8.8   Liver Function Tests:  Recent Labs Lab 12/02/13 0548 12/03/13 0530 12/04/13 0517 12/06/13 0450  AST 285* 96* 39* 33  ALT 446* 278* 177* 115*  ALKPHOS 136* 140* 130* 142*  BILITOT 4.0* 3.3* 2.0* 0.9  PROT 6.4 6.5 6.1 5.8*  ALBUMIN 3.4* 3.3* 2.9* 2.7*    Recent Labs Lab 12/02/13 0548 12/04/13 0517  LIPASE 152* 27   No results found for this basename: AMMONIA,  in the last 168 hours CBC:  Recent Labs Lab 12/03/13 0530  12/04/13 0517  12/05/13 0553 12/05/13 1152 12/06/13 0022 12/06/13 0450 12/08/13 0427  WBC 9.1  --  5.6  --  5.3  --   --  5.7 9.3  HGB 12.7*  --  12.6*  --  12.8*  --   --  13.0 14.7  HCT 38.1*  --  36.3*  --  37.3*  --   --  39.3 43.4  MCV 90.7  --  88.3  --  88.8  --   --  90.3 89.5  PLT 108*  < > 113*  < > 129* 149* 144* 151 200  < > = values in this interval not displayed. Cardiac Enzymes: No results found for this basename: CKTOTAL, CKMB, CKMBINDEX, TROPONINI,  in the last 168 hours BNP (last 3 results)  Recent Labs  12/02/13 1212  PROBNP 3926.0*   CBG:  Recent Labs Lab 12/07/13 1137 12/07/13 1639 12/07/13 2100 12/08/13 0822 12/08/13 1131  GLUCAP 240* 254* 116* 166* 202*    Recent Results (from the past 240 hour(s))  MRSA PCR SCREENING     Status: None   Collection Time    12/03/13  6:55 PM      Result Value Ref Range Status   MRSA by PCR NEGATIVE  NEGATIVE Final   Comment:            The GeneXpert MRSA Assay (FDA     approved for NASAL specimens     only), is one component of a     comprehensive MRSA colonization     surveillance  program. It is not     intended to diagnose MRSA     infection nor to guide or     monitor treatment for     MRSA infections.     Studies: No results found.  Scheduled Meds: . atorvastatin  20 mg Oral q1800  . doxazosin  2 mg Oral QHS  . insulin aspart  0-15 Units Subcutaneous TID WC  . insulin aspart  0-5 Units Subcutaneous QHS  . insulin glargine  20 Units Subcutaneous QHS  . levalbuterol  0.63 mg Nebulization TID  . levofloxacin  500 mg Oral Daily  . levothyroxine  50 mcg Oral QAC breakfast  . lisinopril  20 mg Oral Daily  . metFORMIN  500 mg Oral BID WC  . metoprolol succinate  50 mg Oral Daily  . polyethylene glycol  17 g Oral Daily  . potassium chloride  20 mEq Oral Daily  . predniSONE  30 mg Oral BID WC  . senna-docusate  1 tablet Oral QHS  . sodium chloride  500 mL Intravenous Once  . sodium chloride  3 mL Intravenous Q12H  . warfarin  3 mg Oral Once  . Warfarin - Pharmacist Dosing Inpatient   Does not apply Q24H   Continuous Infusions: . diltiazem (CARDIZEM) infusion 15 mg/hr (12/08/13 0834)   Assessment and plan: Principal Problem:   Pancreatitis due to biliary obstruction Active Problems:   Type 2 diabetes mellitus with diabetic nephropathy   Hypertension   Atrial fibrillation with RVR   Choledocholithiasis   Chronic kidney disease (CKD), stage III (moderate)   Hypothyroidism   Cholelithiasis   Obesity, morbid   Transaminitis   Thrombocytopenia, unspecified   Pulmonary hypertension   DIC (disseminated intravascular coagulation)   Acute bronchitis    1. Acute biliary pancreatitis with CT evidence of common bile duct stone on admission/cholelithiasis/choledocholithiasis. CT on admission revealed a common bile duct stone. GI was consulted. ERCP was considered, but the patient was coagulopathic from Coumadin. Therefore, a MRCP was ordered on 8/19. It did not reveal a common bile duct stone, but it did reveal gallstones in the neck of the bladder; possible  passage of distal CBD stone. The patient's lipase, total bilirubin, and liver transaminases have improved-his lipase is now within normal limits. He is symptomatically improved and tolerating his regular diet. GI. He will need a laparoscopic cholecystectomy and [name] planned the operation for this week, but given the patient's cardiac status, he is deferring surgical intervention. He recommends that the surgical intervention be done electively at a tertiary care center. General surgery referral can be done by the patient's primary care physician, [name]. This was discussed with the patient. Transaminitis and hyperbilirubinemia, secondary to #1. The patient's liver transaminases and total bilirubin are now trending downward. Continue to monitor. Chronic atrial fibrillation, with RVR.... ..Marland KitchenResultant sinus pauses after being given 2 doses of digoxin. His rapid rate is likely exacerbated by his acute illness. Possible contribution from [place]. His 2-D echocardiogram revealed ejection fraction of 55-60%. His TSH is within normal limits. He was transferred to the step down unit on the diltiazem drip and continued on oral diltiazem and metoprolol. 2 dose of of digoxin were given with several sinus pauses lasting no more than 2.5 seconds. Following the curbside consult with cardiologist, [name] over the weekend, he recommended discontinuation of the diltiazem drip and oral diltiazem and starting him on an amiodarone drip. Followup cardiologist recommended discontinuing the amiodarone as the patient was on it in the distant past, but felt bad on it. It was also discontinued because of worsening wheezing. Diltiazem drip has been restarted. Of note, the patient was given IV metoprolol and a higher dose of metoprolol by the cross cover provider, but this may have also led to increased wheezing. Toprol-XL returned to prehospital dose.  Coumadin restarted per pharmacy on 8/24. His rate is still  uncontrolled.  Await further recommendations from cardiology. We'll continue to monitor. Flash pulmonary edema possibly secondary to acute diastolic heart failure in the setting of RVR. Patient did receive 2 doses of IV lasix.  He continues to have evidence of significant pedal edema. Will continue with IV lasix. Coagulopathy, secondary to warfarin. His INR had increased to greater than 3, but is now below 2. No  signs of bleeding. Coumadin was on hold, but has been restarted. Thrombocytopenia. Now resolved. Etiology possibly secondary to low-grade DIC per hematology who was consulted. Workup reveals DIC panel with no schistocytes; TSH is within normal limits. Hepatitis panel so far, nonreactive. Vitamin B12 level within normal limits. HIV antibody negative. Cardiolipin antibodies and ANA were unremarkable.. His platelet count was 204 on admission and has decreased daily, but it is now trending up. I do not believe he was given heparin as he is already anticoagulated. His spleen on CT scan was apparently "normal "per radiology. He was started on Protonix on admission which can cause thrombocytopenia, so it was discontinued. Currently, there are no signs of bleeding. Pulmonary hypertension, per 2-D echocardiogram. Echo revealed peak pulmonary artery pressure of 56 mm of mercury. He does not appear to have cor pulmonale. Continue to monitor. Hypertension. Currently stable and controlled on lisinopril, diltiazem, and lisinopril. HCTZ is on hold. Diabetes mellitus with diabetic nephropathy. Currently on metformin. We'll continue sliding scale NovoLog and Lantus. His hemoglobin A1c was 6.7. Stage III chronic kidney disease. His creatinine has improved from baseline. Continue to monitor. Hypothyroidism. Currently stable on Synthroid. His TSH is within normal limits at 3.1. Wheezes, secondary to upper airway reactive airways or acute bronchitis. Appears to have improved with bronchodilators, steroids and  antibiotics. We'll continue to taper prednisone. Lungs sound clear today the  Time spent: 30 minutes    [facility] Pager [phone]. If 7PM-7AM, please contact night-coverage at [email or web address], password [facility] 12/08/2013, 12:56 PM  LOS: 7 days

## 2013-12-08 NOTE — Progress Notes (Signed)
Nutrition Brief Note  RD pulled to chart due to LOS.  Wt Readings from Last 15 Encounters:  12/08/13 242 lb 11.6 oz (110.1 kg)   The patient is a 80 year old man with a history of chronic atrial fibrillation-on Coumadin, diabetes mellitus, and hypertension, who presented to the emergency department on 12/01/2013 with a chief complaint of abdominal pain. On admission, his lab data were significant for a lipase of 939, AST of 83, and total bilirubin of 1.3. CT of his gallbladder revealed cholelithiasis, gallbladder distention, and a 4 mm mid to distal common bile duct stone. Gastroenterology was consulted. ERCP was considered, but the patient was coagulopathic from Coumadin. Therefore, an MRCP was ordered and it did not reveal a common bile duct stone; the suspicion was that the stone had passed. Cholecystectomy was recommended and general surgery was consulted. The plan was to perform a laparoscopic cholecystectomy when his INR was in an acceptable range, however, the patient developed rapid atrial fibrillation. [name] has deferred the surgery to an elective status. He recommends that the patient's elective cholecystectomy be done at a tertiary care center given his comorbid conditions. The patient's lipase has normalized and his liver transaminases have nearly normalized. His rapid atrial fibrillation has been difficult to manage as he was started on a diltiazem drip for several days. A couple doses of digoxin was given, but he developed sinus pauses. Following a curbside consult with [name], amiodarone drip was started. However, it was discontinued by [name] because the patient had a history of feeling bad while on it in the past. It was also discontinued as the patient appeared to be wheezing more. Diltiazem drip was restarted. Of note, his 2-D echocardiogram revealed preserved left ventricular systolic function. Coumadin was restarted on 8/24.  The patient's platelet count also decreased.  Hematology was consulted and believed that the patient had low-grade DIC. His platelet count normalized.  He can be discharged to home when his atrial fibrillation is under better control.  Pt reports improved appetite; PO: 50-100%. Noted breakfast tray in room- pt ate all expect the biscuit. He denies any weight loss. Good appetite PTA. He is very anxious to go home.   Pt asked this RD if he could eat [place] food for lunch. Informed pt of diet order and rationale for diet restriction. Discussed importance of dietary compliance and continued good PO intake to support healing process and manage co-morbidities both during hospitalization and after discharge. Pt accepted explanation and denies any further nutrition needs at this time.   Body mass index is 32.91 kg/(m^2). Patient meets criteria for obesity, class I based on current BMI.   Current diet order is carb modified, patient is consuming approximately 50-100% of meals at this time. Labs and medications reviewed.   No nutrition interventions warranted at this time. If nutrition issues arise, please consult RD.   Seline Enzor A. Jimmye Norman, RD, LDN Pager: [phone]

## 2013-12-08 NOTE — Progress Notes (Signed)
The patient was seen and examined, and I agree with the assessment and plan as documented above, with modifications as noted below. Pt admitted with acute biliary pancreatitis, rapid atrial fibrillation, and acute diastolic heart failure. Also has low grade DIC, hypothyroidism, and CKD stage III. He said he is feeling well. Pt currently on diltiazem 15 mg/hr with elevated HR, along with Toprol-XL 50 mg daily. On warfarin with subtherapeutic INR. Had sinus pauses yesterday for which diltiazem infusion was stopped but due to elevated HR, it was resumed. I will d/c diltiazem infusion and administer IV amiodarone load and infusion. Given that his left atrium is at the upper limits of normal in size, he has a reasonable chance of successful chemical cardioversion. If this were unsuccessful, I would consider long-acting diltiazem with bid dosing.

## 2013-12-08 NOTE — Progress Notes (Addendum)
ANTICOAGULATION CONSULT NOTE - follow up  Pharmacy Consult for Coumadin Indication: atrial fibrillation  No Known Allergies  Patient Measurements: Height: 6' (182.9 cm) Weight: 242 lb 11.6 oz (110.1 kg) IBW/kg (Calculated) : 77.6  Vital Signs: Temp: 97.3 F (36.3 C) (08/26 0400) Temp src: Oral (08/26 0400) BP: 101/73 mmHg (08/26 0715) Pulse Rate: 119 (08/26 0715)  Labs:  Recent Labs  12/05/13 1152 12/06/13 0022 12/06/13 0450 12/07/13 0446 12/08/13 0427  HGB  --   --  13.0  --  14.7  HCT  --   --  39.3  --  43.4  PLT 149* 144* 151  --  200  APTT 59* 49*  --   --   --   LABPROT 26.3* 23.8* 22.5* 20.6* 22.1*  INR 2.42* 2.13* 1.98* 1.77* 1.93*  CREATININE  --   --  0.89 0.84  --    Estimated Creatinine Clearance: 91.4 ml/min (by C-G formula based on Cr of 0.84).  Medical History: Past Medical History  Diagnosis Date  . Type 2 diabetes mellitus   . Essential hypertension, benign   . Atrial fibrillation     On coumadin  . Chronic kidney disease (CKD), stage III (moderate)   . Hypothyroidism   . Pulmonary hypertension 12/03/2013    PA pressure 56. Ejection fraction 55-60%  . History of cardiac catheterization     Details not certain - reportedly no interventions   Medications:  Prescriptions prior to admission  Medication Sig Dispense Refill  . aspirin 81 MG chewable tablet Chew 81 mg by mouth every other day.      . diltiazem (TIAZAC) 240 MG 24 hr capsule Take 240 mg by mouth daily.      . diphenhydrAMINE (BENADRYL) 25 MG tablet Take 25 mg by mouth every 6 (six) hours as needed. For sleep      . doxazosin (CARDURA) 2 MG tablet Take 2 mg by mouth at bedtime.      Marland Kitchen levothyroxine (SYNTHROID, LEVOTHROID) 50 MCG tablet Take 50 mcg by mouth daily.      Marland Kitchen lisinopril-hydrochlorothiazide (PRINZIDE,ZESTORETIC) 20-25 MG per tablet Take 1 tablet by mouth daily.      . metFORMIN (GLUCOPHAGE) 500 MG tablet Take 1,000 mg by mouth at bedtime.      . metoprolol succinate  (TOPROL-XL) 25 MG 24 hr tablet Take 1 tablet (25 mg total) by mouth daily.  30 tablet  0  . pravastatin (PRAVACHOL) 40 MG tablet Take 80 mg by mouth daily.      Marland Kitchen warfarin (COUMADIN) 3 MG tablet Take 3 mg by mouth daily.       Assessment: 80 yo M admitted with gallstone pancreatitis on 8/19.   He is on chronic Coumadin for Afib.  Home dose listed above.  INR was elevated on admission & Coumadin was held.  Initial plan was for laproscopic cholecystectomy & INR was trending down, however this procedure is being delayed until acute cardiology issues resolved.  INR starting to trend back up.   He is currently in Afib.  Amiodarone infusion has been stopped.     No bleeding noted.   Goal of Therapy:  INR 2-3   Plan:  Coumadin 3mg  po x1 today INR daily  Hart Robinsons A 12/08/2013,8:42 AM

## 2013-12-09 DIAGNOSIS — I5031 Acute diastolic (congestive) heart failure: Secondary | ICD-10-CM

## 2013-12-09 DIAGNOSIS — R7989 Other specified abnormal findings of blood chemistry: Secondary | ICD-10-CM

## 2013-12-09 DIAGNOSIS — I10 Essential (primary) hypertension: Secondary | ICD-10-CM

## 2013-12-09 LAB — GLUCOSE, CAPILLARY
GLUCOSE-CAPILLARY: 199 mg/dL — AB (ref 70–99)
GLUCOSE-CAPILLARY: 200 mg/dL — AB (ref 70–99)
Glucose-Capillary: 265 mg/dL — ABNORMAL HIGH (ref 70–99)
Glucose-Capillary: 206 mg/dL — ABNORMAL HIGH (ref 70–99)

## 2013-12-09 LAB — BASIC METABOLIC PANEL
Anion gap: 10 (ref 5–15)
BUN: 28 mg/dL — ABNORMAL HIGH (ref 6–23)
CO2: 31 meq/L (ref 19–32)
CREATININE: 1.07 mg/dL (ref 0.50–1.35)
Calcium: 8.8 mg/dL (ref 8.4–10.5)
Chloride: 96 mEq/L (ref 96–112)
GFR calc Af Amer: 74 mL/min — ABNORMAL LOW (ref >90)
GFR calc non Af Amer: 64 mL/min — ABNORMAL LOW (ref >90)
GLUCOSE: 212 mg/dL — AB (ref 70–99)
Potassium: 4.3 mEq/L (ref 3.7–5.3)
SODIUM: 137 meq/L (ref 137–147)

## 2013-12-09 LAB — PROTIME-INR
INR: 2.07 — ABNORMAL HIGH (ref 0.00–1.49)
Prothrombin Time: 23.3 seconds — ABNORMAL HIGH (ref 11.6–15.2)

## 2013-12-09 MED ORDER — SODIUM CHLORIDE 0.9 % IJ SOLN: 3.0000 mL | INTRAMUSCULAR | Status: DC | PRN

## 2013-12-09 MED ORDER — METOPROLOL SUCCINATE ER 25 MG PO TB24
25.0000 mg | ORAL_TABLET | Freq: Every evening | ORAL | Status: DC
  Administered 2013-12-09: 25 mg via ORAL
  Filled 2013-12-09: qty 1

## 2013-12-09 MED ORDER — SODIUM CHLORIDE 0.9 % IJ SOLN
3.0000 mL | Freq: Two times a day (BID) | INTRAMUSCULAR | Status: DC
  Administered 2013-12-09 – 2013-12-10 (×2): 3 mL via INTRAVENOUS

## 2013-12-09 MED ORDER — METOPROLOL SUCCINATE ER 50 MG PO TB24: 50.0000 mg | ORAL_TABLET | Freq: Every morning | ORAL | Status: DC

## 2013-12-09 MED ORDER — SODIUM CHLORIDE 0.9 % IV SOLN: 250.0000 mL | INTRAVENOUS | Status: DC

## 2013-12-09 MED ORDER — FUROSEMIDE 40 MG PO TABS
40.0000 mg | ORAL_TABLET | Freq: Every day | ORAL | Status: DC
  Administered 2013-12-10 – 2013-12-11 (×2): 40 mg via ORAL
  Filled 2013-12-09 (×2): qty 1

## 2013-12-09 MED ORDER — LEVALBUTEROL HCL 0.63 MG/3ML IN NEBU: 0.6300 mg | INHALATION_SOLUTION | Freq: Four times a day (QID) | RESPIRATORY_TRACT | Status: DC | PRN

## 2013-12-09 MED ORDER — HYDROCORTISONE 1 % EX CREA
1.0000 "application " | TOPICAL_CREAM | Freq: Three times a day (TID) | CUTANEOUS | Status: DC | PRN
  Filled 2013-12-09: qty 28

## 2013-12-09 MED ORDER — DILTIAZEM HCL 60 MG PO TABS
60.0000 mg | ORAL_TABLET | Freq: Four times a day (QID) | ORAL | Status: DC
  Administered 2013-12-09 – 2013-12-10 (×4): 60 mg via ORAL
  Filled 2013-12-09 (×3): qty 1

## 2013-12-09 MED ORDER — PREDNISONE 20 MG PO TABS
20.0000 mg | ORAL_TABLET | Freq: Two times a day (BID) | ORAL | Status: DC
  Administered 2013-12-09 – 2013-12-10 (×2): 20 mg via ORAL
  Filled 2013-12-09 (×2): qty 1

## 2013-12-09 MED ORDER — DIGOXIN 125 MCG PO TABS
0.0625 mg | ORAL_TABLET | Freq: Every day | ORAL | Status: DC
  Administered 2013-12-09: 0.0625 mg via ORAL
  Filled 2013-12-09: qty 1

## 2013-12-09 MED ORDER — WARFARIN SODIUM 2 MG PO TABS
3.0000 mg | ORAL_TABLET | Freq: Once | ORAL | Status: AC
  Administered 2013-12-09: 3 mg via ORAL
  Filled 2013-12-09 (×2): qty 1

## 2013-12-09 NOTE — Progress Notes (Signed)
ANTICOAGULATION CONSULT NOTE - follow up  Pharmacy Consult for Coumadin Indication: atrial fibrillation  No Known Allergies  Patient Measurements: Height: 6' (182.9 cm) Weight: 242 lb 11.6 oz (110.1 kg) IBW/kg (Calculated) : 77.6  Vital Signs: BP: 127/70 mmHg (08/27 0655) Pulse Rate: 127 (08/27 0655)  Labs:  Recent Labs  12/07/13 0446 12/08/13 0427 12/09/13 0453  HGB  --  14.7  --   HCT  --  43.4  --   PLT  --  200  --   LABPROT 20.6* 22.1* 23.3*  INR 1.77* 1.93* 2.07*  CREATININE 0.84  --  1.07   Estimated Creatinine Clearance: 71.7 ml/min (by C-G formula based on Cr of 1.07).  Medical History: Past Medical History  Diagnosis Date  . Type 2 diabetes mellitus   . Essential hypertension, benign   . Atrial fibrillation     On coumadin  . Chronic kidney disease (CKD), stage III (moderate)   . Hypothyroidism   . Pulmonary hypertension 12/03/2013    PA pressure 56. Ejection fraction 55-60%  . History of cardiac catheterization     Details not certain - reportedly no interventions   Medications:  Prescriptions prior to admission  Medication Sig Dispense Refill  . aspirin 81 MG chewable tablet Chew 81 mg by mouth every other day.      . diltiazem (TIAZAC) 240 MG 24 hr capsule Take 240 mg by mouth daily.      . diphenhydrAMINE (BENADRYL) 25 MG tablet Take 25 mg by mouth every 6 (six) hours as needed. For sleep      . doxazosin (CARDURA) 2 MG tablet Take 2 mg by mouth at bedtime.      Marland Kitchen levothyroxine (SYNTHROID, LEVOTHROID) 50 MCG tablet Take 50 mcg by mouth daily.      Marland Kitchen lisinopril-hydrochlorothiazide (PRINZIDE,ZESTORETIC) 20-25 MG per tablet Take 1 tablet by mouth daily.      . metFORMIN (GLUCOPHAGE) 500 MG tablet Take 1,000 mg by mouth at bedtime.      . metoprolol succinate (TOPROL-XL) 25 MG 24 hr tablet Take 1 tablet (25 mg total) by mouth daily.  30 tablet  0  . pravastatin (PRAVACHOL) 40 MG tablet Take 80 mg by mouth daily.      Marland Kitchen warfarin (COUMADIN) 3 MG tablet  Take 3 mg by mouth daily.       Assessment: 80 yo M admitted with gallstone pancreatitis on 8/19.   He is on chronic Coumadin for Afib.  Home dose listed above.  INR was elevated on admission & Coumadin was held.  Initial plan was for laproscopic cholecystectomy & INR was trending down, however this procedure is being delayed until acute cardiology issues resolved.  He remains in Afib, however amiodarone was stopped due to adverse effects. INR is therapeutic today.   No bleeding noted.   Goal of Therapy:  INR 2-3   Plan:  Coumadin 3mg  po x1 today INR daily  Biagio Borg 12/09/2013,8:04 AM

## 2013-12-09 NOTE — Progress Notes (Signed)
UR chart review completed.  

## 2013-12-09 NOTE — Progress Notes (Signed)
Inpatient Diabetes Program Recommendations  AACE/ADA: New Consensus Statement on Inpatient Glycemic Control (2013)  Target Ranges:  Prepandial:   less than 140 mg/dL      Peak postprandial:   less than 180 mg/dL (1-2 hours)      Critically ill patients:  140 - 180 mg/dL    Results for Brett [name], Brett [name] (MRN [id number]) as of 12/09/2013 07:21  Ref. Range 12/08/2013 08:22 12/08/2013 11:31 12/08/2013 17:28 12/08/2013 22:01  Glucose-Capillary Latest Range: 70-99 mg/dL 166 (H) 202 (H) 235 (H) 207 (H)  Results for Brett [name], Brett [name] (MRN [id number]) as of 12/09/2013 07:21  Ref. Range 12/07/2013 07:36 12/07/2013 11:37 12/07/2013 16:39 12/07/2013 21:00  Glucose-Capillary Latest Range: 70-99 mg/dL 231 (H) 240 (H) 254 (H) 116 (H)   Diabetes history: DM2  Outpatient Diabetes medications: Metformin 1000 mg QHS  Current orders for Inpatient glycemic control: Lantus 20 units QHS, Novolog 0-15 units AC, Novolog 0-5 units HS, Metformin 500 mg BID   Inpatient Diabetes Program Recommendations  Insulin - Meal Coverage: Post prandial glucose consistently elevated. While inpatient and ordered steroids, please consider ordering Novolog 5 units TID with meals for meal coverage.   Thanks,  Barnie Alderman, RN, MSN, CCRN  Diabetes Coordinator  Inpatient Diabetes Program  [phone] (Team Pager)  [phone] ([facility] office)  [phone] [facility] office)

## 2013-12-09 NOTE — Progress Notes (Signed)
[facility] PROGRESS NOTE  Brett [name] KXF:[id number] DOB: [birth year] DOA: 12/01/2013 PCP: [name], MD    Code Status: Full code Family Communication: Discussed with the patient Disposition Plan: Discharge when clinically appropriate.   Consultants:  Gastroenterology  General surgery  Cardiology  Procedures: 2-D echocardiogram 8/20:   Impressions: - Moderate LVH with LVEF 55-60%, cannot exclude basal inferolateral hypokinesis. Indeterminate diastolic function. Upper normal left atrial chamber size. MAC with trivial mitral regurgitation Mildly ectatic aortic root. Mildly sclerotic aortic valve with mild aortic regurgitation. Moderate RV chamber dilatation with normal contraction. Elevated PASP 56 mm mercury with increased estimated CVP.   MRCP abdomen 12/01/13  CT abdomen 12/01/13  Antibiotics:  Levaquin 12/03/13>>8/27  HPI/Subjective:  Brief history: The patient is a 80 year old man with a history of chronic atrial fibrillation-on Coumadin, diabetes mellitus, and hypertension, who presented to the emergency department on 12/01/2013 with a chief complaint of abdominal pain. On admission, his lab data were significant for a lipase of 939, AST of 83, and total bilirubin of 1.3. CT of his gallbladder revealed cholelithiasis, gallbladder distention, and a 4 mm mid to distal common bile duct stone. Gastroenterology was consulted. ERCP was considered, but the patient was coagulopathic from Coumadin. Therefore, an MRCP was ordered and it did not reveal a common bile duct stone; the suspicion was that the stone had passed. Cholecystectomy was recommended and general surgery was consulted. The plan was to perform a laparoscopic cholecystectomy when his INR was in an acceptable range, however, the patient developed rapid atrial fibrillation. [name] has deferred the surgery to an elective status. He recommends that the patient's elective cholecystectomy be done at a tertiary  care center given his comorbid conditions. Patient was instructed to discuss a referral to Gen. surgery in [place] with his primary care physician [name]. The patient's lipase has normalized and his liver transaminases have nearly normalized. His rapid atrial fibrillation has been difficult to manage as he was started on a diltiazem drip for several days. A couple doses of digoxin was given, but he developed sinus pauses. Following a curbside consult with [name] over the weekend, amiodarone drip was started. However, it was discontinued by [name] because the patient had a history of feeling bad while on it in the past. It was also discontinued as the patient appeared to be wheezing more. Diltiazem drip was restarted. Of note, his 2-D echocardiogram revealed preserved left ventricular systolic function. Coumadin was restarted on 8/24. The patient's platelet count also decreased. Hematology was consulted and believed that the patient had low-grade DIC. His platelet count has normalized. He can be discharged to home when his atrial fibrillation is under better control.    Subjective: No new complaints. No abdominal pain, vomiting, chest pain or shortness of breath  Objective: Filed Vitals:   12/09/13 0900  BP: 140/94  Pulse: 136  Temp:   Resp: 17    Intake/Output Summary (Last 24 hours) at 12/09/13 1125 Last data filed at 12/09/13 0000  Gross per 24 hour  Intake 386.75 ml  Output   1050 ml  Net -663.25 ml   Filed Weights   12/06/13 0445 12/07/13 0500 12/08/13 0500  Weight: 109 kg (240 lb 4.8 oz) 108.9 kg (240 lb 1.3 oz) 110.1 kg (242 lb 11.6 oz)    Exam:   General:  Morbidly obese 80 year old sitting up in bed, in no acute distress.  Cardiovascular: Irregular, irregular with tachycardia. 1+ pedal edema.  Respiratory: CTA B  Abdomen: Obese  abdomen, positive bowel sounds, soft, nontender nondistended.  Musculoskeletal: No acute hot red joints.   Neurologic: He  is alert and oriented x3. Cranial nerves II through XII are grossly intact.  Data Reviewed: Basic Metabolic Panel:  Recent Labs Lab 12/04/13 0517 12/05/13 0553 12/06/13 0450 12/07/13 0446 12/09/13 0453  NA 136* 137 136* 137 137  K 4.0 4.3 3.8 4.0 4.3  CL 99 100 98 97 96  CO2 26 25 30 30 31   GLUCOSE 197* 200* 198* 227* 212*  BUN 14 13 17 17  28*  CREATININE 0.78 0.79 0.89 0.84 1.07  CALCIUM 8.1* 8.3* 8.3* 8.8 8.8   Liver Function Tests:  Recent Labs Lab 12/03/13 0530 12/04/13 0517 12/06/13 0450  AST 96* 39* 33  ALT 278* 177* 115*  ALKPHOS 140* 130* 142*  BILITOT 3.3* 2.0* 0.9  PROT 6.5 6.1 5.8*  ALBUMIN 3.3* 2.9* 2.7*    Recent Labs Lab 12/04/13 0517  LIPASE 27   No results found for this basename: AMMONIA,  in the last 168 hours CBC:  Recent Labs Lab 12/03/13 0530  12/04/13 0517  12/05/13 0553 12/05/13 1152 12/06/13 0022 12/06/13 0450 12/08/13 0427  WBC 9.1  --  5.6  --  5.3  --   --  5.7 9.3  HGB 12.7*  --  12.6*  --  12.8*  --   --  13.0 14.7  HCT 38.1*  --  36.3*  --  37.3*  --   --  39.3 43.4  MCV 90.7  --  88.3  --  88.8  --   --  90.3 89.5  PLT 108*  < > 113*  < > 129* 149* 144* 151 200  < > = values in this interval not displayed. Cardiac Enzymes: No results found for this basename: CKTOTAL, CKMB, CKMBINDEX, TROPONINI,  in the last 168 hours BNP (last 3 results)  Recent Labs  12/02/13 1212  PROBNP 3926.0*   CBG:  Recent Labs Lab 12/08/13 0822 12/08/13 1131 12/08/13 1728 12/08/13 2201 12/09/13 0738  GLUCAP 166* 202* 235* 207* 199*    Recent Results (from the past 240 hour(s))  MRSA PCR SCREENING     Status: None   Collection Time    12/03/13  6:55 PM      Result Value Ref Range Status   MRSA by PCR NEGATIVE  NEGATIVE Final   Comment:            The GeneXpert MRSA Assay (FDA     approved for NASAL specimens     only), is one component of a     comprehensive MRSA colonization     surveillance program. It is not      intended to diagnose MRSA     infection nor to guide or     monitor treatment for     MRSA infections.     Studies: No results found.  Scheduled Meds: . atorvastatin  20 mg Oral q1800  . digoxin  0.0625 mg Oral Daily  . diltiazem  60 mg Oral 4 times per day  . doxazosin  2 mg Oral QHS  . [START ON 12/10/2013] furosemide  40 mg Oral Daily  . insulin aspart  0-15 Units Subcutaneous TID WC  . insulin aspart  0-5 Units Subcutaneous QHS  . insulin glargine  20 Units Subcutaneous QHS  . levofloxacin  500 mg Oral Daily  . levothyroxine  50 mcg Oral QAC breakfast  . lisinopril  20 mg Oral Daily  .  metFORMIN  500 mg Oral BID WC  . metoprolol succinate  25 mg Oral QPM  . [START ON 12/10/2013] metoprolol succinate  50 mg Oral q morning - 10a  . polyethylene glycol  17 g Oral Daily  . potassium chloride  20 mEq Oral Daily  . predniSONE  30 mg Oral BID WC  . senna-docusate  1 tablet Oral QHS  . sodium chloride  500 mL Intravenous Once  . sodium chloride  3 mL Intravenous Q12H  . sodium chloride  3 mL Intravenous Q12H  . warfarin  3 mg Oral Once  . Warfarin - Pharmacist Dosing Inpatient   Does not apply Q24H   Continuous Infusions: . sodium chloride     Assessment and plan: Principal Problem:   Pancreatitis due to biliary obstruction Active Problems:   Type 2 diabetes mellitus with diabetic nephropathy   Hypertension   Atrial fibrillation with RVR   Choledocholithiasis   Chronic kidney disease (CKD), stage III (moderate)   Hypothyroidism   Cholelithiasis   Obesity, morbid   Transaminitis   Thrombocytopenia, unspecified   Pulmonary hypertension   DIC (disseminated intravascular coagulation)   Acute bronchitis    1. Acute biliary pancreatitis with CT evidence of common bile duct stone on admission/cholelithiasis/choledocholithiasis. CT on admission revealed a common bile duct stone. GI was consulted. ERCP was considered, but the patient was coagulopathic from Coumadin.  Therefore, a MRCP was ordered on 8/19. It did not reveal a common bile duct stone, but it did reveal gallstones in the neck of the bladder; possible passage of distal CBD stone. The patient's lipase, total bilirubin, and liver transaminases have improved-his lipase is now within normal limits. He is symptomatically improved and tolerating his regular diet. GI. He will need a laparoscopic cholecystectomy and [name] planned the operation for this week, but given the patient's cardiac status, he is deferring surgical intervention. He recommends that the surgical intervention be done electively at a tertiary care center. General surgery referral can be done by the patient's primary care physician, [name]. This was discussed with the patient. Transaminitis and hyperbilirubinemia, secondary to #1. The patient's liver transaminases and total bilirubin are now trending downward. Continue to monitor. Chronic atrial fibrillation, with RVR.... ..Marland KitchenResultant sinus pauses after being given 2 doses of digoxin. His rapid rate is likely exacerbated by his acute illness. Possible contribution from [place]. His 2-D echocardiogram revealed ejection fraction of 55-60%. His TSH is within normal limits. He was transferred to the step down unit on the diltiazem drip and continued on oral diltiazem and metoprolol. 2 dose of of digoxin were given with several sinus pauses lasting no more than 2.5 seconds.Over the next few days, patient was placed on amiodarone infusion and diltiazem infusions without improvement in his heart rate.  Coumadin restarted per pharmacy on 8/24. His rate is still uncontrolled. Further adjustments have been made to his medications today with plans for possible cardioversion tomorrow if heart rate does not improve. We'll continue to monitor. Flash pulmonary edema possibly secondary to acute diastolic heart failure in the setting of RVR. Lung sounds are now clear, but he still has evidence of  LE edema.  Lasix changed to po today.  Continue to follow. Coagulopathy, secondary to warfarin. His INR had increased to greater than 3, but is now below 2. No signs of bleeding. Coumadin was on hold, but has been restarted. Thrombocytopenia. Now resolved. Etiology possibly secondary to low-grade DIC per hematology who was consulted. Workup reveals DIC  panel with no schistocytes; TSH is within normal limits. Hepatitis panel so far, nonreactive. Vitamin B12 level within normal limits. HIV antibody negative. Cardiolipin antibodies and ANA were unremarkable.. His platelet count was 204 on admission and has decreased daily, but it is now trending up. I do not believe he was given heparin as he is already anticoagulated. His spleen on CT scan was apparently "normal "per radiology. He was started on Protonix on admission which can cause thrombocytopenia, so it was discontinued. Currently, there are no signs of bleeding. Pulmonary hypertension, per 2-D echocardiogram. Echo revealed peak pulmonary artery pressure of 56 mm of mercury. He does not appear to have cor pulmonale. Continue to monitor. Hypertension. Currently stable and controlled on lisinopril, diltiazem, and lisinopril. HCTZ is on hold. Diabetes mellitus with diabetic nephropathy. Currently on metformin. We'll continue sliding scale NovoLog and Lantus. His hemoglobin A1c was 6.7. Stage III chronic kidney disease. His creatinine has improved from baseline. Continue to monitor. Hypothyroidism. Currently stable on Synthroid. His TSH is within normal limits at 3.1. Wheezes, secondary to upper airway reactive airways or acute bronchitis. Appears to have improved with bronchodilators, steroids and antibiotics. We'll continue to taper prednisone. Lungs sound clear today. Today is day 7 of levaquin, will discontinue.  Time spent: 30 minutes    [facility] Pager [phone]. If 7PM-7AM, please contact night-coverage at  [email or web address], password [facility] 12/09/2013, 11:25 AM  LOS: 8 days

## 2013-12-09 NOTE — Progress Notes (Addendum)
Consulting cardiologist: Kate Sable Primary Cardiologist: May, Terrance, [place], [place])   Subjective:    Feels ok. No complaints of chest pain or worsening dyspnea.  Objective:   Pulse Rate:  [31-151] 127 (08/27 0655) Resp:  [14-26] 15 (08/27 0600) BP: (106-136)/(67-103) 127/70 mmHg (08/27 0655) SpO2:  [92 %-98 %] 95 % (08/27 0655) Last BM Date: 12/05/13  Filed Weights   12/06/13 0445 12/07/13 0500 12/08/13 0500  Weight: 240 lb 4.8 oz (109 kg) 240 lb 1.3 oz (108.9 kg) 242 lb 11.6 oz (110.1 kg)    Intake/Output Summary (Last 24 hours) at 12/09/13 0820 Last data filed at 12/09/13 0000  Gross per 24 hour  Intake 461.75 ml  Output   1250 ml  Net -788.25 ml    Telemetry:Atrial fib rates up to 140 bpm. Some slowing but no sustained pauses.   Exam:  General: No acute distress.  HEENT: Conjunctiva and lids normal, oropharynx clear.  Lungs: Inspiratory and expiratory wheezes.   Cardiac: No elevated JVP or bruits. IRRR, tachycardic no gallop or rub.   Abdomen: Normoactive bowel sounds, nontender, nondistended.  Extremities: 1+ pitting edema, distal pulses full.  Neuropsychiatric: Alert and oriented x3, affect appropriate.   Lab Results:  Basic Metabolic Panel:  Recent Labs Lab 12/06/13 0450 12/07/13 0446 12/09/13 0453  NA 136* 137 137  K 3.8 4.0 4.3  CL 98 97 96  CO2 30 30 31   GLUCOSE 198* 227* 212*  BUN 17 17 28*  CREATININE 0.89 0.84 1.07  CALCIUM 8.3* 8.8 8.8    Liver Function Tests:  Recent Labs Lab 12/03/13 0530 12/04/13 0517 12/06/13 0450  AST 96* 39* 33  ALT 278* 177* 115*  ALKPHOS 140* 130* 142*  BILITOT 3.3* 2.0* 0.9  PROT 6.5 6.1 5.8*  ALBUMIN 3.3* 2.9* 2.7*    CBC:  Recent Labs Lab 12/05/13 0553  12/06/13 0022 12/06/13 0450 12/08/13 0427  WBC 5.3  --   --  5.7 9.3  HGB 12.8*  --   --  13.0 14.7  HCT 37.3*  --   --  39.3 43.4  MCV 88.8  --   --  90.3 89.5  PLT 129*  < > 144* 151 200  < > = values in this  interval not displayed.  BNP:  Recent Labs  12/02/13 1212  PROBNP 3926.0*    Coagulation:  Recent Labs Lab 12/07/13 0446 12/08/13 0427 12/09/13 0453  INR 1.77* 1.93* 2.07*     Medications:   Scheduled Medications: . atorvastatin  20 mg Oral q1800  . doxazosin  2 mg Oral QHS  . furosemide  40 mg Intravenous BID  . insulin aspart  0-15 Units Subcutaneous TID WC  . insulin aspart  0-5 Units Subcutaneous QHS  . insulin glargine  20 Units Subcutaneous QHS  . levofloxacin  500 mg Oral Daily  . levothyroxine  50 mcg Oral QAC breakfast  . lisinopril  20 mg Oral Daily  . metFORMIN  500 mg Oral BID WC  . metoprolol succinate  50 mg Oral Daily  . polyethylene glycol  17 g Oral Daily  . potassium chloride  20 mEq Oral Daily  . predniSONE  30 mg Oral BID WC  . senna-docusate  1 tablet Oral QHS  . sodium chloride  500 mL Intravenous Once  . sodium chloride  3 mL Intravenous Q12H  . warfarin  3 mg Oral Once  . Warfarin - Pharmacist Dosing Inpatient   Does not apply Q24H  PRN Medications: acetaminophen, acetaminophen, alum & mag hydroxide-simeth, bisacodyl, docusate sodium, levalbuterol, LORazepam, LORazepam, morphine injection, ondansetron (ZOFRAN) IV, ondansetron, polyethylene glycol   Assessment and Plan:   1.Atrial fib with RVR:  Did not respond to diltiazem and changed to amiodarone gtt. He remains on metoprolol XL 50mg  mg. Baseline wheezing is noted with COPD. No worsening. Echo demonstrated that the left atrium was was at the upper limits of normal in size.  I have still not received records from former cardiologist in [place], [place]. Nurses are calling to personally speak with office. Consider adding digoxin as a daily dose. He has had IV dig but was found to have some pauses on this. May start low dose po. Will review records concerning cardiac cath. If no CAD, also consider flecainide.   Also, would consider sleep apnea evaluation. Overnight O2 sat will be ordered.  He states he snores, with body habitus this may be contributing to ongoing atrial fib. Continue coumadin therapy.  2. Hypertension: Currently well controlled.  3. Biliary Pancreatitis: Improving. Management by PTH team.   4, Hx of Hypotension: TSH normal.     Brett [name]. Brett Kobel NP  12/09/2013, 8:20 AM

## 2013-12-09 NOTE — Progress Notes (Addendum)
The patient was seen and examined, and I agree with the assessment and plan as documented above, with modifications as noted below.  Pt admitted with acute biliary pancreatitis, rapid atrial fibrillation, and acute diastolic heart failure. Also has low grade DIC, hypothyroidism, and CKD stage III.  He continues to feel well.  Pt currently on Toprol-XL 50 mg daily. On warfarin with therapeutic INR today but has fluctuated over past few days. Did not cardiovert with IV amiodarone thus stopped today. Starting low-dose digoxin. Will also increase dose of Toprol-XL to 50 mg q am and 25 mg q pm and also start short-acting diltiazem with monitoring for pauses. Given that his left atrium is at the upper limits of normal in size, he has a reasonable chance of successful chemical cardioversion.  If this were unsuccessful, I would consider TEE/cardioversion on 8/28. Given rising BUN, will switch IV Lasix to oral 40 mg daily.

## 2013-12-10 ENCOUNTER — Inpatient Hospital Stay (HOSPITAL_COMMUNITY): Payer: Medicare Other | Admitting: Anesthesiology

## 2013-12-10 ENCOUNTER — Encounter (HOSPITAL_COMMUNITY): Payer: Medicare Other | Admitting: Anesthesiology

## 2013-12-10 ENCOUNTER — Other Ambulatory Visit (HOSPITAL_COMMUNITY): Payer: No Typology Code available for payment source

## 2013-12-10 ENCOUNTER — Encounter (HOSPITAL_COMMUNITY)
Admission: EM | Disposition: A | Discharge status: Home Health | Payer: Self-pay | Source: Home / Self Care | Attending: Internal Medicine

## 2013-12-10 ENCOUNTER — Encounter (HOSPITAL_COMMUNITY): Payer: Self-pay | Admitting: Cardiovascular Disease

## 2013-12-10 DIAGNOSIS — I4891 Unspecified atrial fibrillation: Secondary | ICD-10-CM

## 2013-12-10 DIAGNOSIS — R740 Nonspecific elevation of levels of transaminase and lactic acid dehydrogenase [LDH]: Secondary | ICD-10-CM

## 2013-12-10 DIAGNOSIS — E119 Type 2 diabetes mellitus without complications: Secondary | ICD-10-CM

## 2013-12-10 DIAGNOSIS — R7401 Elevation of levels of liver transaminase levels: Secondary | ICD-10-CM

## 2013-12-10 DIAGNOSIS — J449 Chronic obstructive pulmonary disease, unspecified: Secondary | ICD-10-CM

## 2013-12-10 HISTORY — PX: CARDIOVERSION: SHX1299

## 2013-12-10 HISTORY — PX: TEE WITHOUT CARDIOVERSION: SHX5443

## 2013-12-10 LAB — GLUCOSE, CAPILLARY
GLUCOSE-CAPILLARY: 165 mg/dL — AB (ref 70–99)
Glucose-Capillary: 189 mg/dL — ABNORMAL HIGH (ref 70–99)
Glucose-Capillary: 175 mg/dL — ABNORMAL HIGH (ref 70–99)

## 2013-12-10 LAB — PROTIME-INR
INR: 2.5 — AB (ref 0.00–1.49)
Prothrombin Time: 27 seconds — ABNORMAL HIGH (ref 11.6–15.2)

## 2013-12-10 LAB — BASIC METABOLIC PANEL
Anion gap: 6 (ref 5–15)
BUN: 33 mg/dL — ABNORMAL HIGH (ref 6–23)
CO2: 35 mEq/L — ABNORMAL HIGH (ref 19–32)
Calcium: 8.8 mg/dL (ref 8.4–10.5)
Chloride: 97 mEq/L (ref 96–112)
Creatinine, Ser: 1.22 mg/dL (ref 0.50–1.35)
GFR calc Af Amer: 63 mL/min — ABNORMAL LOW (ref >90)
GFR, EST NON AFRICAN AMERICAN: 55 mL/min — AB (ref >90)
Glucose, Bld: 192 mg/dL — ABNORMAL HIGH (ref 70–99)
POTASSIUM: 4.5 meq/L (ref 3.7–5.3)
SODIUM: 138 meq/L (ref 137–147)

## 2013-12-10 SURGERY — ECHOCARDIOGRAM, TRANSESOPHAGEAL
Anesthesia: Monitor Anesthesia Care

## 2013-12-10 MED ORDER — PROPOFOL 10 MG/ML IV BOLUS
INTRAVENOUS | Status: DC | PRN
  Administered 2013-12-10 (×2): 10.8 mg via INTRAVENOUS

## 2013-12-10 MED ORDER — AMIODARONE IV BOLUS ONLY 150 MG/100ML
150.0000 mg | Freq: Once | INTRAVENOUS | Status: AC
  Administered 2013-12-10: 150 mg via INTRAVENOUS
  Filled 2013-12-10: qty 100

## 2013-12-10 MED ORDER — MIDAZOLAM HCL 2 MG/2ML IJ SOLN
INTRAMUSCULAR | Status: AC
  Filled 2013-12-10: qty 2

## 2013-12-10 MED ORDER — ONDANSETRON HCL 4 MG/2ML IJ SOLN: 4.0000 mg | Freq: Once | INTRAMUSCULAR | Status: AC | PRN

## 2013-12-10 MED ORDER — SODIUM CHLORIDE BACTERIOSTATIC 0.9 % IJ SOLN
INTRAMUSCULAR | Status: DC
Start: 2013-12-10 — End: 2013-12-10
  Filled 2013-12-10: qty 20

## 2013-12-10 MED ORDER — LACTATED RINGERS IV SOLN
INTRAVENOUS | Status: DC | PRN
  Administered 2013-12-10: 11:00:00 via INTRAVENOUS

## 2013-12-10 MED ORDER — FENTANYL CITRATE 0.05 MG/ML IJ SOLN: 25.0000 ug | INTRAMUSCULAR | Status: AC

## 2013-12-10 MED ORDER — METOPROLOL SUCCINATE ER 50 MG PO TB24
50.0000 mg | ORAL_TABLET | Freq: Every day | ORAL | Status: DC
  Administered 2013-12-10 – 2013-12-11 (×2): 50 mg via ORAL
  Filled 2013-12-10 (×2): qty 1

## 2013-12-10 MED ORDER — WARFARIN SODIUM 2 MG PO TABS
2.0000 mg | ORAL_TABLET | Freq: Once | ORAL | Status: AC
  Administered 2013-12-10: 2 mg via ORAL
  Filled 2013-12-10: qty 1

## 2013-12-10 MED ORDER — AMIODARONE HCL IN DEXTROSE 360-4.14 MG/200ML-% IV SOLN
30.0000 mg/h | INTRAVENOUS | Status: DC
Start: 2013-12-10 — End: 2013-12-11
  Administered 2013-12-10 – 2013-12-11 (×2): 30 mg/h via INTRAVENOUS
  Filled 2013-12-10 (×3): qty 200

## 2013-12-10 MED ORDER — PROPOFOL 10 MG/ML IV BOLUS
INTRAVENOUS | Status: AC
  Filled 2013-12-10: qty 40

## 2013-12-10 MED ORDER — AMIODARONE HCL IN DEXTROSE 360-4.14 MG/200ML-% IV SOLN
60.0000 mg/h | INTRAVENOUS | Status: AC
  Administered 2013-12-10: 60 mg/h via INTRAVENOUS

## 2013-12-10 MED ORDER — LIDOCAINE HCL (CARDIAC) 10 MG/ML IV SOLN
INTRAVENOUS | Status: DC | PRN
  Administered 2013-12-10: 10 mg via INTRAVENOUS

## 2013-12-10 MED ORDER — AMIODARONE IV BOLUS ONLY 150 MG/100ML
150.0000 mg | Freq: Once | INTRAVENOUS | Status: AC
  Administered 2013-12-10: 150 mg via INTRAVENOUS

## 2013-12-10 MED ORDER — MIDAZOLAM HCL 5 MG/5ML IJ SOLN
INTRAMUSCULAR | Status: DC | PRN
  Administered 2013-12-10: 0.5 mg via INTRAVENOUS
  Administered 2013-12-10 (×2): .5 mg via INTRAVENOUS

## 2013-12-10 MED ORDER — FENTANYL CITRATE 0.05 MG/ML IJ SOLN
INTRAMUSCULAR | Status: AC
  Filled 2013-12-10: qty 2

## 2013-12-10 MED ORDER — LACTATED RINGERS IV SOLN: INTRAVENOUS | Status: DC

## 2013-12-10 MED ORDER — FENTANYL CITRATE 0.05 MG/ML IJ SOLN
INTRAMUSCULAR | Status: DC | PRN
  Administered 2013-12-10 (×2): 25 ug via INTRAVENOUS
  Administered 2013-12-10: 50 ug via INTRAVENOUS

## 2013-12-10 MED ORDER — FENTANYL CITRATE 0.05 MG/ML IJ SOLN: 25.0000 ug | INTRAMUSCULAR | Status: DC | PRN

## 2013-12-10 MED ORDER — PREDNISONE 10 MG PO TABS
10.0000 mg | ORAL_TABLET | Freq: Two times a day (BID) | ORAL | Status: DC
  Administered 2013-12-10 – 2013-12-11 (×2): 10 mg via ORAL
  Filled 2013-12-10 (×2): qty 1

## 2013-12-10 MED ORDER — BUTAMBEN-TETRACAINE-BENZOCAINE 2-2-14 % EX AERO
INHALATION_SPRAY | CUTANEOUS | Status: AC
  Filled 2013-12-10: qty 56

## 2013-12-10 MED ORDER — LIDOCAINE HCL (PF) 1 % IJ SOLN
INTRAMUSCULAR | Status: AC
Start: 2013-12-10 — End: 2013-12-10
  Filled 2013-12-10: qty 5

## 2013-12-10 MED ORDER — PROPOFOL INFUSION 10 MG/ML OPTIME
INTRAVENOUS | Status: DC | PRN
  Administered 2013-12-10: 50 ug/kg/min via INTRAVENOUS

## 2013-12-10 MED ORDER — MIDAZOLAM HCL 2 MG/2ML IJ SOLN: 1.0000 mg | INTRAMUSCULAR | Status: DC | PRN

## 2013-12-10 NOTE — Progress Notes (Signed)
The patient was seen and examined, and I agree with the assessment and plan as documented above, with modifications as noted below. Pt feels well, but HR remains elevated to 150 bpm range. Medical therapy has been unsuccessful. Will proceed with TEE/cardioversion today. Treatment plan discussed with patient and family members, and they are in agreement. Will hold AV nodal blocking agents this morning, so as not to potentiate chance for post-cardioversion bradycardia/pauses. IV Lasix has been transitioned to oral with rising BUN/creatinine.

## 2013-12-10 NOTE — Progress Notes (Signed)
  Echocardiogram Echocardiogram Transesophageal has been performed.  [place], [place] 12/10/2013, 12:31 PM

## 2013-12-10 NOTE — Progress Notes (Signed)
Consulting cardiologist: Kate Sable Primary Cardiologist: Everlena Cooper MD [place], [place])  Subjective:    No complaints of chest pain. Breathing status is better. No abdominal pain.   Objective:   Temp:  [97.1 F (36.2 C)-98.2 F (36.8 C)] 97.5 F (36.4 C) (08/28 0400) Pulse Rate:  [30-162] 66 (08/28 0500) Resp:  [15-23] 16 (08/28 0500) BP: (95-157)/(59-120) 121/87 mmHg (08/28 0500) SpO2:  [93 %-98 %] 94 % (08/28 0500) FiO2 (%):  [28 %] 28 % (08/27 2300) Weight:  [238 lb 8.6 oz (108.2 kg)] 238 lb 8.6 oz (108.2 kg) (08/28 0500) Last BM Date: 12/05/13  Filed Weights   12/07/13 0500 12/08/13 0500 12/10/13 0500  Weight: 240 lb 1.3 oz (108.9 kg) 242 lb 11.6 oz (110.1 kg) 238 lb 8.6 oz (108.2 kg)    Intake/Output Summary (Last 24 hours) at 12/10/13 0758 Last data filed at 12/09/13 2100  Gross per 24 hour  Intake 179.53 ml  Output   1250 ml  Net -1070.47 ml    Telemetry: Atrial fib with rates between 129 and 150 bpm.  Exam:  General: No acute distress.  HEENT: Conjunctiva and lids normal, oropharynx clear.  Lungs: Clear to auscultation, nonlabored. Fine crackles, no wheezes are noted. Some coughing, non-productive.  Cardiac: No elevated JVP or bruits. IRRR, tachycardic, no gallop or rub.   Abdomen: Normoactive bowel sounds, nontender, nondistended.  Extremities: No pitting edema, distal pulses full.  Neuropsychiatric: Alert and oriented x3, affect appropriate.   Lab Results:  Basic Metabolic Panel:  Recent Labs Lab 12/07/13 0446 12/09/13 0453 12/10/13 0503  NA 137 137 138  K 4.0 4.3 4.5  CL 97 96 97  CO2 30 31 35*  GLUCOSE 227* 212* 192*  BUN 17 28* 33*  CREATININE 0.84 1.07 1.22  CALCIUM 8.8 8.8 8.8    Liver Function Tests:  Recent Labs Lab 12/04/13 0517 12/06/13 0450  AST 39* 33  ALT 177* 115*  ALKPHOS 130* 142*  BILITOT 2.0* 0.9  PROT 6.1 5.8*  ALBUMIN 2.9* 2.7*    CBC:  Recent Labs Lab 12/05/13 0553  12/06/13 0022  12/06/13 0450 12/08/13 0427  WBC 5.3  --   --  5.7 9.3  HGB 12.8*  --   --  13.0 14.7  HCT 37.3*  --   --  39.3 43.4  MCV 88.8  --   --  90.3 89.5  PLT 129*  < > 144* 151 200  < > = values in this interval not displayed.  BNP:  Recent Labs  12/02/13 1212  PROBNP 3926.0*    Coagulation:  Recent Labs Lab 12/08/13 0427 12/09/13 0453 12/10/13 0503  INR 1.93* 2.07* 2.50*      Medications:   Scheduled Medications: . atorvastatin  20 mg Oral q1800  . digoxin  0.0625 mg Oral Daily  . diltiazem  60 mg Oral 4 times per day  . doxazosin  2 mg Oral QHS  . furosemide  40 mg Oral Daily  . insulin aspart  0-15 Units Subcutaneous TID WC  . insulin aspart  0-5 Units Subcutaneous QHS  . insulin glargine  20 Units Subcutaneous QHS  . levothyroxine  50 mcg Oral QAC breakfast  . lisinopril  20 mg Oral Daily  . metFORMIN  500 mg Oral BID WC  . metoprolol succinate  25 mg Oral QPM  . metoprolol succinate  50 mg Oral q morning - 10a  . polyethylene glycol  17 g Oral Daily  . potassium  chloride  20 mEq Oral Daily  . predniSONE  20 mg Oral BID WC  . senna-docusate  1 tablet Oral QHS  . sodium chloride  500 mL Intravenous Once  . sodium chloride  3 mL Intravenous Q12H  . sodium chloride  3 mL Intravenous Q12H  . Warfarin - Pharmacist Dosing Inpatient   Does not apply Q24H    Infusions: . sodium chloride      PRN Medications: acetaminophen, acetaminophen, alum & mag hydroxide-simeth, bisacodyl, docusate sodium, hydrocortisone cream, levalbuterol, LORazepam, LORazepam, morphine injection, ondansetron (ZOFRAN) IV, ondansetron, polyethylene glycol, sodium chloride   Assessment and Plan:   1. Atrial fib with RVR: Now back oon diltiazem 60 mg QID,  Amiodarone discontinued yesterday, given low dose digoxin 0.625 mg p.o.  yesterday, with increased dose of metoprolol to 50 mg in am and 25 mg in pm. HR still elevated 120-150's. He remains on coumadin therapy with INR of 2.50 this am.  Planned TEE/DCCV this am.   2. Hypertension: Excellent control.   3. COPD: Remains on steroids, changed to Xopenex neb tx. Abx completed.   4. Pancreatitis: Improved symptoms.   5.Diabetes: Management per PTH  Phill Myron. Jenniferann Stuckert NP  12/10/2013, 7:58 AM

## 2013-12-10 NOTE — Procedures (Signed)
TEE performed without difficulty. No left atrial appendage thrombus noted. Successfully cardioverted from rapid atrial fibrillation to normal sinus rhythm. Will load with IV amiodarone and concomitantly start Toprol-XL 50 mg daily. I would recommend treating with oral amiodarone afterwards for at least a period of 1 month, but not necessarily long term.

## 2013-12-10 NOTE — Progress Notes (Signed)
Electrical Cardioversion Procedure Note Steve Youngberg [id number] January 05, 1933  Procedure: Electrical Cardioversion Indications:  Atrial Fibrillation   Procedure Details Consent: Risks of procedure as well as the alternatives and risks of each were explained to the (patient/caregiver).  Consent for procedure obtained. Time Out: Verified patient identification, verified procedure, site/side was marked, verified correct patient position, special equipment/implants available, medications/allergies/relevent history reviewed, required imaging and test results available.  Performed  Patient placed on cardiac monitor, pulse oximetry, supplemental oxygen as necessary.  Sedation given: Benzodiazepines and propofol Pacer pads placed anterior and posterior chest.  Cardioverted 1 time(s).  Cardioverted at 150J.  Evaluation Findings: Post procedure EKG shows: NSR with sinus arrhythmia and PVCs Complications: None Patient did tolerate procedure well.   Charm Barges S 12/10/2013, 12:14 PM

## 2013-12-10 NOTE — Anesthesia Procedure Notes (Signed)
Procedure Name: MAC Date/Time: 12/10/2013 10:30 AM Performed by: Vista Deck Pre-anesthesia Checklist: Patient identified, Emergency Drugs available, Suction available, Timeout performed and Patient being monitored Patient Re-evaluated:Patient Re-evaluated prior to inductionOxygen Delivery Method: Non-rebreather mask

## 2013-12-10 NOTE — Anesthesia Postprocedure Evaluation (Signed)
  Anesthesia Post-op Note  Patient: Brett [name]  Procedure(s) Performed: Procedure(s): TRANSESOPHAGEAL ECHOCARDIOGRAM (TEE) (N/A) CARDIOVERSION (N/A)  Patient Location: PACU  Anesthesia Type:MAC  Level of Consciousness: awake and patient cooperative  Airway and Oxygen Therapy: Patient Spontanous Breathing and non-rebreather face mask  Post-op Pain: none  Post-op Assessment: Post-op Vital signs reviewed, Patient's Cardiovascular Status Stable, Respiratory Function Stable and Patent Airway  Post-op Vital Signs: Reviewed and stable   Complications: No apparent anesthesia complications

## 2013-12-10 NOTE — Progress Notes (Signed)
[facility] PROGRESS NOTE  Brett [name] FIE:[id number] DOB: 1932/11/12 DOA: 12/01/2013 PCP: [name], MD    Code Status: Full code Family Communication: Discussed with the patient Disposition Plan: Discharge when clinically appropriate.   Consultants:  Gastroenterology  General surgery  Cardiology  Procedures: 2-D echocardiogram 8/20:   Impressions: - Moderate LVH with LVEF 55-60%, cannot exclude basal inferolateral hypokinesis. Indeterminate diastolic function. Upper normal left atrial chamber size. MAC with trivial mitral regurgitation Mildly ectatic aortic root. Mildly sclerotic aortic valve with mild aortic regurgitation. Moderate RV chamber dilatation with normal contraction. Elevated PASP 56 mm mercury with increased estimated CVP.   MRCP abdomen 12/01/13  CT abdomen 12/01/13  Antibiotics:  Levaquin 12/03/13>>8/27  HPI/Subjective:  Brief history: The patient is a 80 year old man with a history of chronic atrial fibrillation-on Coumadin, diabetes mellitus, and hypertension, who presented to the emergency department on 12/01/2013 with a chief complaint of abdominal pain. On admission, his lab data were significant for a lipase of 939, AST of 83, and total bilirubin of 1.3. CT of his gallbladder revealed cholelithiasis, gallbladder distention, and a 4 mm mid to distal common bile duct stone. Gastroenterology was consulted. ERCP was considered, but the patient was coagulopathic from Coumadin. Therefore, an MRCP was ordered and it did not reveal a common bile duct stone; the suspicion was that the stone had passed. Cholecystectomy was recommended and general surgery was consulted. The plan was to perform a laparoscopic cholecystectomy when his INR was in an acceptable range, however, the patient developed rapid atrial fibrillation. [name] has deferred the surgery to an elective status. He recommends that the patient's elective cholecystectomy be done at a tertiary  care center given his comorbid conditions. Patient was instructed to discuss a referral to Gen. surgery in [place] with his primary care physician [name]. The patient's lipase has normalized and his liver transaminases have nearly normalized. His rapid atrial fibrillation has been difficult to manage as he was started on a diltiazem drip for several days. A couple doses of digoxin was given, but he developed sinus pauses. Following a curbside consult with [name] over the weekend, amiodarone drip was started. However, it was discontinued by [name] because the patient had a history of feeling bad while on it in the past. It was also discontinued as the patient appeared to be wheezing more. Diltiazem drip was restarted. Of note, his 2-D echocardiogram revealed preserved left ventricular systolic function. Coumadin was restarted on 8/24. The patient's platelet count also decreased. Hematology was consulted and believed that the patient had low-grade DIC. His platelet count has normalized. He can be discharged to home when his atrial fibrillation is under better control.    Subjective: No shortness of breath, chest pain, abdominal pain.  Objective: Filed Vitals:   12/10/13 1150  BP:   Pulse: 91  Temp:   Resp: 16    Intake/Output Summary (Last 24 hours) at 12/10/13 1203 Last data filed at 12/10/13 1129  Gross per 24 hour  Intake    200 ml  Output    875 ml  Net   -675 ml   Filed Weights   12/07/13 0500 12/08/13 0500 12/10/13 0500  Weight: 108.9 kg (240 lb 1.3 oz) 110.1 kg (242 lb 11.6 oz) 108.2 kg (238 lb 8.6 oz)    Exam:   General:  Morbidly obese 80 year old sitting up in bed, in no acute distress.  Cardiovascular: Irregular, irregular with tachycardia. 1+ pedal edema.  Respiratory: CTA B  Abdomen:  Obese abdomen, positive bowel sounds, soft, nontender nondistended.  Musculoskeletal: No acute hot red joints.   Neurologic: He is alert and oriented x3. Cranial  nerves II through XII are grossly intact.  Data Reviewed: Basic Metabolic Panel:  Recent Labs Lab 12/05/13 0553 12/06/13 0450 12/07/13 0446 12/09/13 0453 12/10/13 0503  NA 137 136* 137 137 138  K 4.3 3.8 4.0 4.3 4.5  CL 100 98 97 96 97  CO2 25 30 30 31  35*  GLUCOSE 200* 198* 227* 212* 192*  BUN 13 17 17  28* 33*  CREATININE 0.79 0.89 0.84 1.07 1.22  CALCIUM 8.3* 8.3* 8.8 8.8 8.8   Liver Function Tests:  Recent Labs Lab 12/04/13 0517 12/06/13 0450  AST 39* 33  ALT 177* 115*  ALKPHOS 130* 142*  BILITOT 2.0* 0.9  PROT 6.1 5.8*  ALBUMIN 2.9* 2.7*    Recent Labs Lab 12/04/13 0517  LIPASE 27   No results found for this basename: AMMONIA,  in the last 168 hours CBC:  Recent Labs Lab 12/04/13 0517  12/05/13 0553 12/05/13 1152 12/06/13 0022 12/06/13 0450 12/08/13 0427  WBC 5.6  --  5.3  --   --  5.7 9.3  HGB 12.6*  --  12.8*  --   --  13.0 14.7  HCT 36.3*  --  37.3*  --   --  39.3 43.4  MCV 88.3  --  88.8  --   --  90.3 89.5  PLT 113*  < > 129* 149* 144* 151 200  < > = values in this interval not displayed. Cardiac Enzymes: No results found for this basename: CKTOTAL, CKMB, CKMBINDEX, TROPONINI,  in the last 168 hours BNP (last 3 results)  Recent Labs  12/02/13 1212  PROBNP 3926.0*   CBG:  Recent Labs Lab 12/09/13 0738 12/09/13 1150 12/09/13 1645 12/09/13 2141 12/10/13 0820  GLUCAP 199* 200* 265* 206* 165*    Recent Results (from the past 240 hour(s))  MRSA PCR SCREENING     Status: None   Collection Time    12/03/13  6:55 PM      Result Value Ref Range Status   MRSA by PCR NEGATIVE  NEGATIVE Final   Comment:            The GeneXpert MRSA Assay (FDA     approved for NASAL specimens     only), is one component of a     comprehensive MRSA colonization     surveillance program. It is not     intended to diagnose MRSA     infection nor to guide or     monitor treatment for     MRSA infections.     Studies: No results  found.  Scheduled Meds: . amiodarone  150 mg Intravenous Once  . atorvastatin  20 mg Oral q1800  . butamben-tetracaine-benzocaine      . doxazosin  2 mg Oral QHS  . furosemide  40 mg Oral Daily  . insulin aspart  0-15 Units Subcutaneous TID WC  . insulin aspart  0-5 Units Subcutaneous QHS  . insulin glargine  20 Units Subcutaneous QHS  . levothyroxine  50 mcg Oral QAC breakfast  . lisinopril  20 mg Oral Daily  . metFORMIN  500 mg Oral BID WC  . metoprolol succinate  50 mg Oral Daily  . polyethylene glycol  17 g Oral Daily  . potassium chloride  20 mEq Oral Daily  . predniSONE  10 mg Oral BID WC  .  senna-docusate  1 tablet Oral QHS  . sodium chloride  500 mL Intravenous Once  . sodium chloride  3 mL Intravenous Q12H  . sodium chloride  3 mL Intravenous Q12H  . sodium chloride bacteriostatic      . warfarin  2 mg Oral Once  . Warfarin - Pharmacist Dosing Inpatient   Does not apply Q24H   Continuous Infusions: . sodium chloride    . amiodarone     Followed by  . amiodarone    . lactated ringers     Assessment and plan: Principal Problem:   Pancreatitis due to biliary obstruction Active Problems:   Type 2 diabetes mellitus with diabetic nephropathy   Hypertension   Atrial fibrillation with RVR   Choledocholithiasis   Chronic kidney disease (CKD), stage III (moderate)   Hypothyroidism   Cholelithiasis   Obesity, morbid   Transaminitis   Thrombocytopenia, unspecified   Pulmonary hypertension   DIC (disseminated intravascular coagulation)   Acute bronchitis    1. Acute biliary pancreatitis with CT evidence of common bile duct stone on admission/cholelithiasis/choledocholithiasis. CT on admission revealed a common bile duct stone. GI was consulted. ERCP was considered, but the patient was coagulopathic from Coumadin. Therefore, a MRCP was ordered on 8/19. It did not reveal a common bile duct stone, but it did reveal gallstones in the neck of the bladder; possible passage  of distal CBD stone. The patient's lipase, total bilirubin, and liver transaminases have improved-his lipase is now within normal limits. He is symptomatically improved and tolerating his regular diet. GI. He will need a laparoscopic cholecystectomy and [name] planned the operation for this week, but given the patient's cardiac status, he is deferring surgical intervention. He recommends that the surgical intervention be done electively at a tertiary care center. General surgery referral can be done by the patient's primary care physician, [name]. This was discussed with the patient. Transaminitis and hyperbilirubinemia, secondary to #1. The patient's liver transaminases and total bilirubin are now trending downward. Continue to monitor. Chronic atrial fibrillation, with RVR.... ..Marland KitchenResultant sinus pauses after being given 2 doses of digoxin. His rapid rate is likely exacerbated by his acute illness. Possible contribution from Home. His 2-D echocardiogram revealed ejection fraction of 55-60%. His TSH is within normal limits. He was transferred to the step down unit on the diltiazem drip and continued on oral diltiazem and metoprolol. 2 dose of of digoxin were given with several sinus pauses lasting no more than 2.5 seconds.Over the next few days, patient was placed on amiodarone infusion and diltiazem infusions without improvement in his heart rate.  Coumadin restarted per pharmacy on 8/24. His rate remained uncontrolled. Plan are for DCCV today. We'll continue to monitor. Flash pulmonary edema possibly secondary to acute diastolic heart failure in the setting of RVR. Lung sounds are now clear, but he still has evidence of LE edema.  Lasix changed to po.  Continue to follow. Coagulopathy, secondary to warfarin. His INR had increased to greater than 3, but is now below 2. No signs of bleeding. Coumadin was on hold, but has been restarted. Thrombocytopenia. Now resolved. Etiology  possibly secondary to low-grade DIC per hematology who was consulted. Workup reveals DIC panel with no schistocytes; TSH is within normal limits. Hepatitis panel so far, nonreactive. Vitamin B12 level within normal limits. HIV antibody negative. Cardiolipin antibodies and ANA were unremarkable.. His platelet count was 204 on admission and has decreased daily, but it is now trending up. I do not  believe he was given heparin as he is already anticoagulated. His spleen on CT scan was apparently "normal "per radiology. He was started on Protonix on admission which can cause thrombocytopenia, so it was discontinued. Currently, there are no signs of bleeding. Pulmonary hypertension, per 2-D echocardiogram. Echo revealed peak pulmonary artery pressure of 56 mm of mercury. He does not appear to have cor pulmonale. Continue to monitor. Hypertension. Currently stable and controlled on lisinopril, diltiazem, and lisinopril. HCTZ is on hold. Diabetes mellitus with diabetic nephropathy. Currently on metformin. We'll continue sliding scale NovoLog and Lantus. His hemoglobin A1c was 6.7. Stage III chronic kidney disease. His creatinine has improved from baseline. Continue to monitor. Hypothyroidism. Currently stable on Synthroid. His TSH is within normal limits at 3.1. Wheezes, secondary to upper airway reactive airways or acute bronchitis. Appears to have improved with bronchodilators, steroids and antibiotics. We'll continue to taper prednisone. Lungs sound clear today. He has completed a course of antibiotics  Time spent: 30 minutes    [facility] Pager [phone]. If 7PM-7AM, please contact night-coverage at [email or web address], password [email or web address] 12/10/2013, 12:03 PM  LOS: 9 days

## 2013-12-10 NOTE — Progress Notes (Signed)
ANTICOAGULATION CONSULT NOTE   Pharmacy Consult for Coumadin Indication: atrial fibrillation  No Known Allergies  Patient Measurements: Height: 6' (182.9 cm) Weight: 238 lb 8.6 oz (108.2 kg) IBW/kg (Calculated) : 77.6  Vital Signs: Temp: 97.5 F (36.4 C) (08/28 0800) Temp src: Oral (08/28 0800) BP: 123/84 mmHg (08/28 0900) Pulse Rate: 46 (08/28 1119)  Labs:  Recent Labs  12/08/13 0427 12/09/13 0453 12/10/13 0503  HGB 14.7  --   --   HCT 43.4  --   --   PLT 200  --   --   LABPROT 22.1* 23.3* 27.0*  INR 1.93* 2.07* 2.50*  CREATININE  --  1.07 1.22   Estimated Creatinine Clearance: 62.4 ml/min (by C-G formula based on Cr of 1.22).  Medical History: Past Medical History  Diagnosis Date  . Type 2 diabetes mellitus   . Essential hypertension, benign   . Atrial fibrillation     On coumadin  . Chronic kidney disease (CKD), stage III (moderate)   . Hypothyroidism   . Pulmonary hypertension 12/03/2013    PA pressure 56. Ejection fraction 55-60%  . History of cardiac catheterization     Details not certain - reportedly no interventions   Medications:  Prescriptions prior to admission  Medication Sig Dispense Refill  . aspirin 81 MG chewable tablet Chew 81 mg by mouth every other day.      . diltiazem (TIAZAC) 240 MG 24 hr capsule Take 240 mg by mouth daily.      . diphenhydrAMINE (BENADRYL) 25 MG tablet Take 25 mg by mouth every 6 (six) hours as needed. For sleep      . doxazosin (CARDURA) 2 MG tablet Take 2 mg by mouth at bedtime.      Marland Kitchen levothyroxine (SYNTHROID, LEVOTHROID) 50 MCG tablet Take 50 mcg by mouth daily.      Marland Kitchen lisinopril-hydrochlorothiazide (PRINZIDE,ZESTORETIC) 20-25 MG per tablet Take 1 tablet by mouth daily.      . metFORMIN (GLUCOPHAGE) 500 MG tablet Take 1,000 mg by mouth at bedtime.      . metoprolol succinate (TOPROL-XL) 25 MG 24 hr tablet Take 1 tablet (25 mg total) by mouth daily.  30 tablet  0  . pravastatin (PRAVACHOL) 40 MG tablet Take 80 mg  by mouth daily.      Marland Kitchen warfarin (COUMADIN) 3 MG tablet Take 3 mg by mouth daily.       Assessment: 80 yo M admitted with gallstone pancreatitis on 8/19.   He is on chronic Coumadin for Afib.  Home dose listed above.  INR was elevated on admission & Coumadin was held.  Initial plan was for laproscopic cholecystectomy & INR was trending down, however this procedure is being delayed until acute cardiology issues resolved.  He remained in Afib until TEE/DCCV completed today.  IV Amiodarone restarted.  No bleeding noted. INR now trending up.  Goal of Therapy:  INR 2-3   Plan:  Coumadin 2mg  po x1 today INR daily  Pricilla Larsson 12/10/2013,11:43 AM

## 2013-12-10 NOTE — Transfer of Care (Signed)
Immediate Anesthesia Transfer of Care Note  Patient: Brett [name]  Procedure(s) Performed: Procedure(s) (LRB): TRANSESOPHAGEAL ECHOCARDIOGRAM (TEE) (N/A) CARDIOVERSION (N/A)  Patient Location: PACU  Anesthesia Type: MAC  Level of Consciousness: awake  Airway & Oxygen Therapy: Patient Spontanous Breathing. Nasal cannula  Post-op Assessment: Report given to PACU RN, Post -op Vital signs reviewed and stable and Patient moving all extremities  Post vital signs: Reviewed and stable  Complications: No apparent anesthesia complications

## 2013-12-10 NOTE — Anesthesia Preprocedure Evaluation (Addendum)
Anesthesia Evaluation  Patient identified by MRN, date of birth, ID band Patient awake    Reviewed: Allergy & Precautions, H&P , NPO status , Patient's Chart, lab work & pertinent test results, reviewed documented beta blocker date and time   Airway Mallampati: III TM Distance: >3 FB Neck ROM: Full    Dental  (+) Edentulous Upper, Poor Dentition   Pulmonary shortness of breath and at rest, former smoker,   Mild wheezing  + wheezing      Cardiovascular hypertension, Pt. on home beta blockers and Pt. on medications + DOE + dysrhythmias Atrial Fibrillation Rhythm:Irregular Rate:Tachycardia     Neuro/Psych    GI/Hepatic   Endo/Other  diabetes, Well Controlled, Type 2, Oral Hypoglycemic AgentsHypothyroidism   Renal/GU Renal InsufficiencyRenal disease     Musculoskeletal  (+) Arthritis -, Bilateral hip replacement   Abdominal   Peds  Hematology   Anesthesia Other Findings   Reproductive/Obstetrics                          Anesthesia Physical Anesthesia Plan  ASA: III  Anesthesia Plan: MAC   Post-op Pain Management:    Induction:   Airway Management Planned: Simple Face Mask  Additional Equipment:   Intra-op Plan:   Post-operative Plan:   Informed Consent: I have reviewed the patients History and Physical, chart, labs and discussed the procedure including the risks, benefits and alternatives for the proposed anesthesia with the patient or authorized representative who has indicated his/her understanding and acceptance.   Dental advisory given  Plan Discussed with: Anesthesiologist  Anesthesia Plan Comments: (Reviewed and agree with plan.)       Anesthesia Quick Evaluation

## 2013-12-10 NOTE — Addendum Note (Signed)
Addendum created 12/10/13 1153 by Vista Deck, CRNA   Modules edited: Charges VN

## 2013-12-11 LAB — CBC
HCT: 43.8 % (ref 39.0–52.0)
Hemoglobin: 14.6 g/dL (ref 13.0–17.0)
MCH: 30.7 pg (ref 26.0–34.0)
MCHC: 33.3 g/dL (ref 30.0–36.0)
MCV: 92.2 fL (ref 78.0–100.0)
Platelets: 243 10*3/uL (ref 150–400)
RBC: 4.75 MIL/uL (ref 4.22–5.81)
RDW: 13.1 % (ref 11.5–15.5)
WBC: 10.8 10*3/uL — ABNORMAL HIGH (ref 4.0–10.5)

## 2013-12-11 LAB — BASIC METABOLIC PANEL
Anion gap: 6 (ref 5–15)
BUN: 34 mg/dL — AB (ref 6–23)
CALCIUM: 8.7 mg/dL (ref 8.4–10.5)
CO2: 37 mEq/L — ABNORMAL HIGH (ref 19–32)
Chloride: 95 mEq/L — ABNORMAL LOW (ref 96–112)
Creatinine, Ser: 1.34 mg/dL (ref 0.50–1.35)
GFR calc Af Amer: 56 mL/min — ABNORMAL LOW (ref >90)
GFR, EST NON AFRICAN AMERICAN: 49 mL/min — AB (ref >90)
GLUCOSE: 127 mg/dL — AB (ref 70–99)
Potassium: 4.5 mEq/L (ref 3.7–5.3)
SODIUM: 138 meq/L (ref 137–147)

## 2013-12-11 LAB — GLUCOSE, CAPILLARY
GLUCOSE-CAPILLARY: 104 mg/dL — AB (ref 70–99)
Glucose-Capillary: 92 mg/dL (ref 70–99)

## 2013-12-11 LAB — PROTIME-INR
INR: 3.57 — ABNORMAL HIGH (ref 0.00–1.49)
Prothrombin Time: 35.7 seconds — ABNORMAL HIGH (ref 11.6–15.2)

## 2013-12-11 MED ORDER — LISINOPRIL 20 MG PO TABS: 20.0000 mg | ORAL_TABLET | Freq: Every day | ORAL | Status: DC

## 2013-12-11 MED ORDER — METOPROLOL SUCCINATE ER 50 MG PO TB24: 50.0000 mg | ORAL_TABLET | Freq: Every day | ORAL | Status: DC

## 2013-12-11 MED ORDER — FUROSEMIDE 20 MG PO TABS: 20.0000 mg | ORAL_TABLET | Freq: Every day | ORAL | Status: DC

## 2013-12-11 MED ORDER — ALBUTEROL SULFATE HFA 108 (90 BASE) MCG/ACT IN AERS: 2.0000 | INHALATION_SPRAY | Freq: Four times a day (QID) | RESPIRATORY_TRACT | Status: DC | PRN

## 2013-12-11 MED ORDER — PREDNISONE 10 MG PO TABS: ORAL_TABLET | ORAL | Status: DC

## 2013-12-11 MED ORDER — AMIODARONE HCL 200 MG PO TABS: ORAL_TABLET | ORAL | Status: DC

## 2013-12-11 MED ORDER — DILTIAZEM HCL 30 MG PO TABS: 30.0000 mg | ORAL_TABLET | Freq: Two times a day (BID) | ORAL | Status: DC

## 2013-12-11 MED ORDER — POTASSIUM CHLORIDE 20 MEQ PO PACK: 20.0000 meq | PACK | Freq: Every day | ORAL | Status: DC

## 2013-12-11 NOTE — Progress Notes (Addendum)
Pt is to be discharged home today with home health RN. Pt is in NAD, IV is out, all paperwork has been reviewed/discussed with patient, and there are no questions/concerns at this time. [facility] has decided [facility]. [facility] called and notified of pt's discharge and all pertinent paperwork has [facility] to [facility]. Assessment is unchanged from this morning. Pt is to be accompanied downstairs by staff and family via wheelchair.

## 2013-12-11 NOTE — Discharge Instructions (Signed)

## 2013-12-11 NOTE — Discharge Summary (Signed)
Physician Discharge Summary  Brett [name] CHE:[id number] DOB: 03-12-1933 DOA: 12/01/2013  PCP: [name], MD  Admit date: 12/01/2013 Discharge date: 12/11/2013  Time spent: 40 minutes  Recommendations for Outpatient Follow-up:  1. Follow up with primary care doctor in 1 week to discuss referral to tertiary center for elective cholecystectomy 2. Follow up with cardiology in 1-2 weeks  Discharge Diagnoses:  Principal Problem:   Pancreatitis due to biliary obstruction Active Problems:   Type 2 diabetes mellitus with diabetic nephropathy   Hypertension   Atrial fibrillation with RVR   Choledocholithiasis   Chronic kidney disease (CKD), stage III (moderate)   Hypothyroidism   Cholelithiasis   Obesity, morbid   Transaminitis   Thrombocytopenia, unspecified   Pulmonary hypertension   DIC (disseminated intravascular coagulation)   Acute bronchitis   Discharge Condition: improved  Diet recommendation: low salt, low carb  Filed Weights   12/08/13 0500 12/10/13 0500 12/11/13 0500  Weight: 110.1 kg (242 lb 11.6 oz) 108.2 kg (238 lb 8.6 oz) 109.5 kg (241 lb 6.5 oz)    History of present illness and hospital course:  This patient was admitted to the hospital with abdominal pain. Was found to have a significantly elevated lipase consistent with pancreatitis. CT of the abdomen indicated cholelithiasis and gallbladder distention, as well as a 4 mm mid to distal common bile stone. Gastroenterology was consulted an ERCP was considered, but the patient was coagulopathic from Coumadin. MRCP was ordered and did not reveal any common bile duct stone. The suspicion was that his stone had passed. Cholecystectomy was recommended and patient was seen by general surgery. Patient's Coumadin was held and his INR was trending down. Unfortunately, he developed rapid atrial fibrillation. [name] felt that surgery should be changed to an elective status and recommended that the patient followup tertiary  care center due to his comorbidity conditions. This referral can be arranged by his primary care physician. He's not having any symptoms in terms of abdominal pain, nausea or vomiting. He is tolerating a solid diet without difficulty. Regarding his atrial fibrillation, he was placed on diltiazem and amiodarone infusions. He was seen by cardiology. Ultimately, he did require electrical cardioversion and has maintained sinus rhythm since then. He did have some shortness of breath which is likely related to COPD exacerbation. He was started on steroids for wheezing antibiotics and bronchodilators. His respiratory status appears back to baseline he is ambulating on room air without shortness of breath. On ambulation today, his heart rate was noted to increase to 120s, sinus rhythm. Case was discussed with cardiology and it was recommended to add low-dose diltiazem in addition to his amiodarone and Toprol. He continues anticoagulation with Coumadin. He will follow up with cardiology in the outpatient setting.   Procedures:  TEE/DCCV on 8/28 2-D echocardiogram 8/20: Impressions: - Moderate LVH with LVEF 55-60%, cannot exclude basal inferolateral hypokinesis. Indeterminate diastolic function. Upper normal left atrial chamber size. MAC with trivial mitral regurgitation Mildly ectatic aortic root. Mildly sclerotic aortic valve with mild aortic regurgitation. Moderate RV chamber dilatation with normal contraction. Elevated PASP 56 mm mercury with increased estimated CVP.  MRCP abdomen 12/01/13  CT abdomen 12/01/13   Consultations:  Cardiology  General surgery  Gastroenterology  Hematology  Discharge Exam: Filed Vitals:   12/11/13 1200  BP: 113/62  Pulse: 63  Temp:   Resp: 16    General: NAD Cardiovascular: S1, s2 RRR, 1+ edema b/l Respiratory: CTA B  Discharge Instructions You were cared for by a  hospitalist during your hospital stay. If you have any questions about your discharge  medications or the care you received while you were in the hospital after you are discharged, you can call the unit and asked to speak with the hospitalist on call if the hospitalist that took care of you is not available. Once you are discharged, your primary care physician will handle any further medical issues. Please note that NO REFILLS for any discharge medications will be authorized once you are discharged, as it is imperative that you return to your primary care physician (or establish a relationship with a primary care physician if you do not have one) for your aftercare needs so that they can reassess your need for medications and monitor your lab values.      Discharge Instructions   Call MD for:  difficulty breathing, headache or visual disturbances    Complete by:  As directed      Call MD for:  persistant dizziness or light-headedness    Complete by:  As directed      Call MD for:  severe uncontrolled pain    Complete by:  As directed      Diet - low sodium heart healthy    Complete by:  As directed      Diet Carb Modified    Complete by:  As directed      Face-to-face encounter (required for Medicare/Medicaid patients)    Complete by:  As directed   I MEMON,JEHANZEB certify that this patient is under my care and that I, or a nurse practitioner or physician's assistant working with me, had a face-to-face encounter that meets the physician face-to-face encounter requirements with this patient on 12/11/2013. The encounter with the patient was in whole, or in part for the following medical condition(s) which is the primary reason for home health care (List medical condition): patient with rapid atrial fibrillation requiring cardioversion, copd exacerbation, needs home health RN for disease management  The encounter with the patient was in whole, or in part, for the following medical condition, which is the primary reason for home health care:  rapid atrial fibrillation, copd  I certify  that, based on my findings, the following services are medically necessary home health services:  Nursing  My clinical findings support the need for the above services:  Shortness of breath with activity  Further, I certify that my clinical findings support that this patient is homebound due to:  Shortness of Breath with activity  Reason for Medically Necessary Home Health Services:  Skilled Nursing- Skilled Assessment/Observation     Home Health    Complete by:  As directed   To provide the following care/treatments:  RN     Increase activity slowly    Complete by:  As directed             Medication List    STOP taking these medications       diltiazem 240 MG 24 hr capsule  Commonly known as:  TIAZAC     lisinopril-hydrochlorothiazide 20-25 MG per tablet  Commonly known as:  PRINZIDE,ZESTORETIC      TAKE these medications       albuterol 108 (90 BASE) MCG/ACT inhaler  Commonly known as:  PROVENTIL HFA;VENTOLIN HFA  Inhale 2 puffs into the lungs every 6 (six) hours as needed for wheezing or shortness of breath.     amiodarone 200 MG tablet  Commonly known as:  PACERONE  Take 400mg  po bid for 1  week then 200mg  po bid     aspirin 81 MG chewable tablet  Chew 81 mg by mouth every other day.     diltiazem 30 MG tablet  Commonly known as:  CARDIZEM  Take 1 tablet (30 mg total) by mouth 2 (two) times daily.     diphenhydrAMINE 25 MG tablet  Commonly known as:  BENADRYL  Take 25 mg by mouth every 6 (six) hours as needed. For sleep     doxazosin 2 MG tablet  Commonly known as:  CARDURA  Take 2 mg by mouth at bedtime.     furosemide 20 MG tablet  Commonly known as:  LASIX  Take 1 tablet (20 mg total) by mouth daily. Start on 12/14/13     levothyroxine 50 MCG tablet  Commonly known as:  SYNTHROID, LEVOTHROID  Take 50 mcg by mouth daily.     lisinopril 20 MG tablet  Commonly known as:  PRINIVIL,ZESTRIL  Take 1 tablet (20 mg total) by mouth daily.     metFORMIN 500 MG  tablet  Commonly known as:  GLUCOPHAGE  Take 1,000 mg by mouth at bedtime.     metoprolol succinate 50 MG 24 hr tablet  Commonly known as:  TOPROL-XL  Take 1 tablet (50 mg total) by mouth daily. Take with or immediately following a meal.     potassium chloride 20 MEQ packet  Commonly known as:  KLOR-CON  Take 20 mEq by mouth daily. Start on 12/14/13     pravastatin 40 MG tablet  Commonly known as:  PRAVACHOL  Take 80 mg by mouth daily.     predniSONE 10 MG tablet  Commonly known as:  DELTASONE  Take 20mg  po daily for 2 days then 10mg  po daily for 2 days then stop     warfarin 3 MG tablet  Commonly known as:  COUMADIN  Take 3 mg by mouth daily.       No Known Allergies Follow-up Information   Follow up with [name], MD. Schedule an appointment as soon as possible for a visit in 2 weeks.   Specialty:  Family Medicine   Contact information:   [address] [place] [address] [phone]       Follow up with Herminio Commons, MD. (they will call you for an appointment)    Specialty:  Cardiology   Contact information:   [place] [place] [address] [phone]        The results of significant diagnostics from this hospitalization (including imaging, microbiology, ancillary and laboratory) are listed below for reference.    Significant Diagnostic Studies: Ct Abdomen Pelvis W Contrast  12/01/2013   CLINICAL DATA:  Abdominal pain.  EXAM: CT ABDOMEN AND PELVIS WITH CONTRAST  TECHNIQUE: Multidetector CT imaging of the abdomen and pelvis was performed using the standard protocol following bolus administration of intravenous contrast.  CONTRAST:  45mL OMNIPAQUE IOHEXOL 300 MG/ML SOLN, 75mL OMNIPAQUE IOHEXOL 300 MG/ML SOLN  COMPARISON:  None.  FINDINGS: LUNG BASES: Included view of the lung bases demonstrate mild pleural thickening and dependent atelectasis. Lingular atelectasis versus scarring. Heart size is normal, pericardium is unremarkable. Coronary artery  calcifications present.  SOLID ORGANS: The pancreas and adrenal glands are unremarkable. Multiple subcentimeter gallstones without CT findings of acute cholecystitis. Mild gallbladder distention. 4 mm dense presumed stone within the mid to distal Common bile duct, coronal 56/119. 17 mm cyst in central right lobe of the liver, the liver is otherwise  unremarkable. Spleen, adrenal glands are normal in appearance.  GASTROINTESTINAL TRACT: The stomach, small and large bowel are normal in course and caliber without inflammatory changes. Sigmoid diverticulosis. Enteric contrast has not yet reached the distal small bowel. The appendix is not discretely identified, however there are no inflammatory changes in the right lower quadrant.  KIDNEYS/ URINARY TRACT: Kidneys are orthotopic, demonstrating symmetric enhancement. 4 mm right interpolar nephrolithiasis. No hydronephrosis or solid renal masses. 2.7 cm left interpolar cyst. Nonspecific bilateral perinephric stranding. The unopacified ureters are normal in course and caliber. Delayed imaging through the kidneys demonstrates symmetric prompt contrast excretion within the proximal urinary collecting system. Urinary bladder is grossly unremarkable though predominately obscured by streak artifact from hip arthroplasties.  PERITONEUM/RETROPERITONEUM: No intraperitoneal free fluid nor free air. Aortoiliac vessels are normal in course and caliber, moderate calcific atherosclerosis. No lymphadenopathy by CT size criteria. Internal reproductive organs are unremarkable.  SOFT TISSUE/OSSEOUS STRUCTURES: Status post bilateral hip arthroplasties which results in streak artifact limiting assessment of the pelvic contents. Small fat containing umbilical hernia.  IMPRESSION: Cholelithiasis, gallbladder distention with 4 mm mid to distal Common bile duct stone. No CT findings of acute cholecystitis.  4 mm nonobstructing right nephrolithiasis.   Electronically Signed   By: Elon Alas    On: 12/01/2013 04:07   Mr 3d Recon At Scanner  12/01/2013   CLINICAL DATA:  Abdominal pain. Gallstones with choledocholithiasis on CT. Diabetes.  EXAM: MRI ABDOMEN WITHOUT AND WITH CONTRAST (INCLUDING MRCP)  TECHNIQUE: Multiplanar multisequence MR imaging of the abdomen was performed both before and after the administration of intravenous contrast. Heavily T2-weighted images of the biliary and pancreatic ducts were obtained, and three-dimensional MRCP images were rendered by post processing.  CONTRAST:  66mL MULTIHANCE GADOBENATE DIMEGLUMINE 529 MG/ML IV SOLN  COMPARISON:  12/01/2013  FINDINGS: Despite efforts by the technologist and patient, motion artifact is present on today's exam and could not be eliminated. This reduces exam sensitivity and specificity. The 1.8 x 1.2 cm nonenhancing T2 hyperintense structure in the right hepatic lobe, image 16 of series 5, compatible with cyst.  There are a number of filling defects in the neck of the gallbladder compatible with gallstones. A periampullary duodenal diverticulum is present.  On image 23 of series 3 there is a 4 mm apparent filling defect in the CBD near the cystic duct attachment, but on multiplanar assessment I think that this probably represents a crossing vessel causing volume averaging rather than a true stone. The previously seen distal CBD stone on CT scan is no longer well appreciated. On images 24-25 of series 3 the possibility of a fluid level in the CBD is raised, potentially from sludge. CBD caliber 7 mm, previously 10 mm. Gallbladder wall mildly thickened at 4 mm.  No abnormal pancreatic enhancement. Adrenal glands and spleen appear normal. No enhancing liver mass.  IMPRESSION: 1. I do not see the distal CBD calculi on today' s MRCP, with the understanding that there is extensive breathing motion artifact which reduces diagnostic sensitivity and specificity. CBD caliber is 7 mm, within normal limits for age. It is possible that the prior stones  have passed. 2. There gallstones in the neck of the gallbladder. Crossing vessel causes a false filling defect in the CBD near the cystic duct attachment. 3. Periampullary duodenal diverticulum. 4. Mild gallbladder wall thickening.   Electronically Signed   By: Sherryl Barters M.D.   On: 12/01/2013 14:51   Dg Chest Port 1 View  12/05/2013  CLINICAL DATA:  Wheezing, history of atrial fibrillation and diabetes  EXAM: PORTABLE CHEST - 1 VIEW  COMPARISON:  Portable chest x-ray of December 02, 2013  FINDINGS: The lungs are reasonably well inflated. There is no focal infiltrate. The interstitial markings are coarse and are more conspicuous today. There is stable calcified nodularity over the lateral aspect of the left sixth rib. The cardiac silhouette is mildly enlarged but stable. The central pulmonary vascularity is prominent without cephalization.  IMPRESSION: There has been slight interval increase in the conspicuity of the pulmonary interstitium which may reflect mild interstitial edema. A followup PA and lateral chest film would be useful when the patient can tolerate the procedure.   Electronically Signed   By: David  Martinique   On: 12/05/2013 08:47   Dg Chest Port 1 View  12/02/2013   CLINICAL DATA:  Wheezing  EXAM: PORTABLE CHEST - 1 VIEW  COMPARISON:  December 01, 2013  FINDINGS: There are calcified granulomas in the left mid lung. There is no edema or consolidation. Heart is mildly enlarged with pulmonary vascularity within normal limits. No adenopathy. No bone lesions.  IMPRESSION: No edema or consolidation. Calcified granulomas in left mid lung. Mild cardiac enlargement is stable.   Electronically Signed   By: Lowella Grip M.D.   On: 12/02/2013 13:25   Dg Chest Port 1 View  12/01/2013   CLINICAL DATA:  Chest pain  EXAM: PORTABLE CHEST - 1 VIEW  COMPARISON:  07/10/2011  FINDINGS: Mild cardiomegaly, similar to previous given differences in technique. There are low lung volumes with interstitial  crowding. There is a right infrahilar opacity which is small and indistinct. Abdominal CT has been ordered, and will likely encompass this area. Calcified granuloma in the left lung. No effusion, edema, or pneumothorax.  IMPRESSION: Indistinct opacity at the medial right base could reflect atelectasis or infectious infiltrate. This area may be encompassed on abdominal CT currently pending. If not, recommend chest x-ray after convalescence.   Electronically Signed   By: Jorje Guild M.D.   On: 12/01/2013 02:20   Mr Abd W/wo Cm/mrcp  12/01/2013   CLINICAL DATA:  Abdominal pain. Gallstones with choledocholithiasis on CT. Diabetes.  EXAM: MRI ABDOMEN WITHOUT AND WITH CONTRAST (INCLUDING MRCP)  TECHNIQUE: Multiplanar multisequence MR imaging of the abdomen was performed both before and after the administration of intravenous contrast. Heavily T2-weighted images of the biliary and pancreatic ducts were obtained, and three-dimensional MRCP images were rendered by post processing.  CONTRAST:  38mL MULTIHANCE GADOBENATE DIMEGLUMINE 529 MG/ML IV SOLN  COMPARISON:  12/01/2013  FINDINGS: Despite efforts by the technologist and patient, motion artifact is present on today's exam and could not be eliminated. This reduces exam sensitivity and specificity. The 1.8 x 1.2 cm nonenhancing T2 hyperintense structure in the right hepatic lobe, image 16 of series 5, compatible with cyst.  There are a number of filling defects in the neck of the gallbladder compatible with gallstones. A periampullary duodenal diverticulum is present.  On image 23 of series 3 there is a 4 mm apparent filling defect in the CBD near the cystic duct attachment, but on multiplanar assessment I think that this probably represents a crossing vessel causing volume averaging rather than a true stone. The previously seen distal CBD stone on CT scan is no longer well appreciated. On images 24-25 of series 3 the possibility of a fluid level in the CBD is raised,  potentially from sludge. CBD caliber 7 mm, previously 10 mm. Gallbladder wall  mildly thickened at 4 mm.  No abnormal pancreatic enhancement. Adrenal glands and spleen appear normal. No enhancing liver mass.  IMPRESSION: 1. I do not see the distal CBD calculi on today' s MRCP, with the understanding that there is extensive breathing motion artifact which reduces diagnostic sensitivity and specificity. CBD caliber is 7 mm, within normal limits for age. It is possible that the prior stones have passed. 2. There gallstones in the neck of the gallbladder. Crossing vessel causes a false filling defect in the CBD near the cystic duct attachment. 3. Periampullary duodenal diverticulum. 4. Mild gallbladder wall thickening.   Electronically Signed   By: Sherryl Barters M.D.   On: 12/01/2013 14:51    Microbiology: Recent Results (from the past 240 hour(s))  MRSA PCR SCREENING     Status: None   Collection Time    12/03/13  6:55 PM      Result Value Ref Range Status   MRSA by PCR NEGATIVE  NEGATIVE Final   Comment:            The GeneXpert MRSA Assay (FDA     approved for NASAL specimens     only), is one component of a     comprehensive MRSA colonization     surveillance program. It is not     intended to diagnose MRSA     infection nor to guide or     monitor treatment for     MRSA infections.     Labs: Basic Metabolic Panel:  Recent Labs Lab 12/06/13 0450 12/07/13 0446 12/09/13 0453 12/10/13 0503 12/11/13 0505  NA 136* 137 137 138 138  K 3.8 4.0 4.3 4.5 4.5  CL 98 97 96 97 95*  CO2 30 30 31  35* 37*  GLUCOSE 198* 227* 212* 192* 127*  BUN 17 17 28* 33* 34*  CREATININE 0.89 0.84 1.07 1.22 1.34  CALCIUM 8.3* 8.8 8.8 8.8 8.7   Liver Function Tests:  Recent Labs Lab 12/06/13 0450  AST 33  ALT 115*  ALKPHOS 142*  BILITOT 0.9  PROT 5.8*  ALBUMIN 2.7*   No results found for this basename: LIPASE, AMYLASE,  in the last 168 hours No results found for this basename: AMMONIA,  in the  last 168 hours CBC:  Recent Labs Lab 12/05/13 0553 12/05/13 1152 12/06/13 0022 12/06/13 0450 12/08/13 0427 12/11/13 0505  WBC 5.3  --   --  5.7 9.3 10.8*  HGB 12.8*  --   --  13.0 14.7 14.6  HCT 37.3*  --   --  39.3 43.4 43.8  MCV 88.8  --   --  90.3 89.5 92.2  PLT 129* 149* 144* 151 200 243   Cardiac Enzymes: No results found for this basename: CKTOTAL, CKMB, CKMBINDEX, TROPONINI,  in the last 168 hours BNP: BNP (last 3 results)  Recent Labs  12/02/13 1212  PROBNP 3926.0*   CBG:  Recent Labs Lab 12/10/13 0820 12/10/13 1636 12/10/13 2114 12/11/13 0742 12/11/13 1128  GLUCAP 165* 189* 175* 92 104*       Signed:  MEMON,JEHANZEB  [facility] 12/11/2013, 8:24 PM

## 2013-12-11 NOTE — Progress Notes (Deleted)
Physician Discharge Summary  Brett [name] VXB:[id number] DOB: 08/07/1932 DOA: 12/01/2013  PCP: [name], MD  Admit date: 12/01/2013 Discharge date: 12/11/2013  Time spent: 40 minutes  Recommendations for Outpatient Follow-up:  1. Follow up with primary care doctor in 1 week to discuss referral to tertiary center for elective cholecystectomy 2. Follow up with cardiology in 1-2 weeks  Discharge Diagnoses:  Principal Problem:   Pancreatitis due to biliary obstruction Active Problems:   Type 2 diabetes mellitus with diabetic nephropathy   Hypertension   Atrial fibrillation with RVR   Choledocholithiasis   Chronic kidney disease (CKD), stage III (moderate)   Hypothyroidism   Cholelithiasis   Obesity, morbid   Transaminitis   Thrombocytopenia, unspecified   Pulmonary hypertension   DIC (disseminated intravascular coagulation)   Acute bronchitis   Discharge Condition: improved  Diet recommendation: low salt, low carb  Filed Weights   12/08/13 0500 12/10/13 0500 12/11/13 0500  Weight: 110.1 kg (242 lb 11.6 oz) 108.2 kg (238 lb 8.6 oz) 109.5 kg (241 lb 6.5 oz)    History of present illness and hospital course:  This patient was admitted to the hospital with abdominal pain. Was found to have a significantly elevated lipase consistent with pancreatitis. CT of the abdomen indicated cholelithiasis and gallbladder distention, as well as a 4 mm mid to distal common bile stone. Gastroenterology was consulted an ERCP was considered, but the patient was coagulopathic from Coumadin. MRCP was ordered and did not reveal any common bile duct stone. The suspicion was that his stone had passed. Cholecystectomy was recommended and patient was seen by general surgery. Patient's Coumadin was held and his INR was trending down. Unfortunately, he developed rapid atrial fibrillation. [name] felt that surgery should be changed to an elective status and recommended that the patient followup tertiary  care center due to his comorbidity conditions. This referral can be arranged by his primary care physician. He's not having any symptoms in terms of abdominal pain, nausea or vomiting. He is tolerating a solid diet without difficulty. Regarding his atrial fibrillation, he was placed on diltiazem and amiodarone infusions. He was seen by cardiology. Ultimately, he did require electrical cardioversion and has maintained sinus rhythm since then. He did have some shortness of breath which is likely related to COPD exacerbation. He was started on steroids for wheezing antibiotics and bronchodilators. His respiratory status appears back to baseline he is ambulating on room air without shortness of breath. On ambulation today, his heart rate was noted to increase to 120s, sinus rhythm. Case was discussed with cardiology and it was recommended to add low-dose diltiazem in addition to his amiodarone and Toprol. He continues anticoagulation with Coumadin. He will follow up with cardiology in the outpatient setting.   Procedures:  TEE/DCCV on 8/28 2-D echocardiogram 8/20: Impressions: - Moderate LVH with LVEF 55-60%, cannot exclude basal inferolateral hypokinesis. Indeterminate diastolic function. Upper normal left atrial chamber size. MAC with trivial mitral regurgitation Mildly ectatic aortic root. Mildly sclerotic aortic valve with mild aortic regurgitation. Moderate RV chamber dilatation with normal contraction. Elevated PASP 56 mm mercury with increased estimated CVP.  MRCP abdomen 12/01/13  CT abdomen 12/01/13   Consultations:  Cardiology  General surgery  Gastroenterology  Hematology  Discharge Exam: Filed Vitals:   12/11/13 1200  BP: 113/62  Pulse: 63  Temp:   Resp: 16    General: NAD Cardiovascular: S1, s2 RRR, 1+ edema b/l Respiratory: CTA B  Discharge Instructions You were cared for by a  hospitalist during your hospital stay. If you have any questions about your discharge  medications or the care you received while you were in the hospital after you are discharged, you can call the unit and asked to speak with the hospitalist on call if the hospitalist that took care of you is not available. Once you are discharged, your primary care physician will handle any further medical issues. Please note that NO REFILLS for any discharge medications will be authorized once you are discharged, as it is imperative that you return to your primary care physician (or establish a relationship with a primary care physician if you do not have one) for your aftercare needs so that they can reassess your need for medications and monitor your lab values.      Discharge Instructions   Call MD for:  difficulty breathing, headache or visual disturbances    Complete by:  As directed      Call MD for:  persistant dizziness or light-headedness    Complete by:  As directed      Call MD for:  severe uncontrolled pain    Complete by:  As directed      Diet - low sodium heart healthy    Complete by:  As directed      Diet Carb Modified    Complete by:  As directed      Face-to-face encounter (required for Medicare/Medicaid patients)    Complete by:  As directed   I Naelani Lafrance certify that this patient is under my care and that I, or a nurse practitioner or physician's assistant working with me, had a face-to-face encounter that meets the physician face-to-face encounter requirements with this patient on 12/11/2013. The encounter with the patient was in whole, or in part for the following medical condition(s) which is the primary reason for home health care (List medical condition): patient with rapid atrial fibrillation requiring cardioversion, copd exacerbation, needs home health RN for disease management  The encounter with the patient was in whole, or in part, for the following medical condition, which is the primary reason for home health care:  rapid atrial fibrillation, copd  I certify  that, based on my findings, the following services are medically necessary home health services:  Nursing  My clinical findings support the need for the above services:  Shortness of breath with activity  Further, I certify that my clinical findings support that this patient is homebound due to:  Shortness of Breath with activity  Reason for Medically Necessary Home Health Services:  Skilled Nursing- Skilled Assessment/Observation     Home Health    Complete by:  As directed   To provide the following care/treatments:  RN     Increase activity slowly    Complete by:  As directed             Medication List    STOP taking these medications       diltiazem 240 MG 24 hr capsule  Commonly known as:  TIAZAC     lisinopril-hydrochlorothiazide 20-25 MG per tablet  Commonly known as:  PRINZIDE,ZESTORETIC      TAKE these medications       albuterol 108 (90 BASE) MCG/ACT inhaler  Commonly known as:  PROVENTIL HFA;VENTOLIN HFA  Inhale 2 puffs into the lungs every 6 (six) hours as needed for wheezing or shortness of breath.     amiodarone 200 MG tablet  Commonly known as:  PACERONE  Take 400mg  po bid for 1  week then 200mg  po bid     aspirin 81 MG chewable tablet  Chew 81 mg by mouth every other day.     diltiazem 30 MG tablet  Commonly known as:  CARDIZEM  Take 1 tablet (30 mg total) by mouth 2 (two) times daily.     diphenhydrAMINE 25 MG tablet  Commonly known as:  BENADRYL  Take 25 mg by mouth every 6 (six) hours as needed. For sleep     doxazosin 2 MG tablet  Commonly known as:  CARDURA  Take 2 mg by mouth at bedtime.     furosemide 20 MG tablet  Commonly known as:  LASIX  Take 1 tablet (20 mg total) by mouth daily. Start on 12/14/13     levothyroxine 50 MCG tablet  Commonly known as:  SYNTHROID, LEVOTHROID  Take 50 mcg by mouth daily.     lisinopril 20 MG tablet  Commonly known as:  PRINIVIL,ZESTRIL  Take 1 tablet (20 mg total) by mouth daily.     metFORMIN 500 MG  tablet  Commonly known as:  GLUCOPHAGE  Take 1,000 mg by mouth at bedtime.     metoprolol succinate 50 MG 24 hr tablet  Commonly known as:  TOPROL-XL  Take 1 tablet (50 mg total) by mouth daily. Take with or immediately following a meal.     potassium chloride 20 MEQ packet  Commonly known as:  KLOR-CON  Take 20 mEq by mouth daily. Start on 12/14/13     pravastatin 40 MG tablet  Commonly known as:  PRAVACHOL  Take 80 mg by mouth daily.     predniSONE 10 MG tablet  Commonly known as:  DELTASONE  Take 20mg  po daily for 2 days then 10mg  po daily for 2 days then stop     warfarin 3 MG tablet  Commonly known as:  COUMADIN  Take 3 mg by mouth daily.       No Known Allergies Follow-up Information   Follow up with [name], MD. Schedule an appointment as soon as possible for a visit in 2 weeks.   Specialty:  Family Medicine   Contact information:   [address] [place] [address] [phone]       Follow up with Herminio Commons, MD. (they will call you for an appointment)    Specialty:  Cardiology   Contact information:   [place] [id number] [phone]        The results of significant diagnostics from this hospitalization (including imaging, microbiology, ancillary and laboratory) are listed below for reference.    Significant Diagnostic Studies: Ct Abdomen Pelvis W Contrast  12/01/2013   CLINICAL DATA:  Abdominal pain.  EXAM: CT ABDOMEN AND PELVIS WITH CONTRAST  TECHNIQUE: Multidetector CT imaging of the abdomen and pelvis was performed using the standard protocol following bolus administration of intravenous contrast.  CONTRAST:  46mL OMNIPAQUE IOHEXOL 300 MG/ML SOLN, 44mL OMNIPAQUE IOHEXOL 300 MG/ML SOLN  COMPARISON:  None.  FINDINGS: LUNG BASES: Included view of the lung bases demonstrate mild pleural thickening and dependent atelectasis. Lingular atelectasis versus scarring. Heart size is normal, pericardium is unremarkable. Coronary artery  calcifications present.  SOLID ORGANS: The pancreas and adrenal glands are unremarkable. Multiple subcentimeter gallstones without CT findings of acute cholecystitis. Mild gallbladder distention. 4 mm dense presumed stone within the mid to distal Common bile duct, coronal 56/119. 17 mm cyst in central right lobe of the liver, the liver is otherwise  unremarkable. Spleen, adrenal glands are normal in appearance.  GASTROINTESTINAL TRACT: The stomach, small and large bowel are normal in course and caliber without inflammatory changes. Sigmoid diverticulosis. Enteric contrast has not yet reached the distal small bowel. The appendix is not discretely identified, however there are no inflammatory changes in the right lower quadrant.  KIDNEYS/ URINARY TRACT: Kidneys are orthotopic, demonstrating symmetric enhancement. 4 mm right interpolar nephrolithiasis. No hydronephrosis or solid renal masses. 2.7 cm left interpolar cyst. Nonspecific bilateral perinephric stranding. The unopacified ureters are normal in course and caliber. Delayed imaging through the kidneys demonstrates symmetric prompt contrast excretion within the proximal urinary collecting system. Urinary bladder is grossly unremarkable though predominately obscured by streak artifact from hip arthroplasties.  PERITONEUM/RETROPERITONEUM: No intraperitoneal free fluid nor free air. Aortoiliac vessels are normal in course and caliber, moderate calcific atherosclerosis. No lymphadenopathy by CT size criteria. Internal reproductive organs are unremarkable.  SOFT TISSUE/OSSEOUS STRUCTURES: Status post bilateral hip arthroplasties which results in streak artifact limiting assessment of the pelvic contents. Small fat containing umbilical hernia.  IMPRESSION: Cholelithiasis, gallbladder distention with 4 mm mid to distal Common bile duct stone. No CT findings of acute cholecystitis.  4 mm nonobstructing right nephrolithiasis.   Electronically Signed   By: Elon Alas    On: 12/01/2013 04:07   Mr 3d Recon At Scanner  12/01/2013   CLINICAL DATA:  Abdominal pain. Gallstones with choledocholithiasis on CT. Diabetes.  EXAM: MRI ABDOMEN WITHOUT AND WITH CONTRAST (INCLUDING MRCP)  TECHNIQUE: Multiplanar multisequence MR imaging of the abdomen was performed both before and after the administration of intravenous contrast. Heavily T2-weighted images of the biliary and pancreatic ducts were obtained, and three-dimensional MRCP images were rendered by post processing.  CONTRAST:  60mL MULTIHANCE GADOBENATE DIMEGLUMINE 529 MG/ML IV SOLN  COMPARISON:  12/01/2013  FINDINGS: Despite efforts by the technologist and patient, motion artifact is present on today's exam and could not be eliminated. This reduces exam sensitivity and specificity. The 1.8 x 1.2 cm nonenhancing T2 hyperintense structure in the right hepatic lobe, image 16 of series 5, compatible with cyst.  There are a number of filling defects in the neck of the gallbladder compatible with gallstones. A periampullary duodenal diverticulum is present.  On image 23 of series 3 there is a 4 mm apparent filling defect in the CBD near the cystic duct attachment, but on multiplanar assessment I think that this probably represents a crossing vessel causing volume averaging rather than a true stone. The previously seen distal CBD stone on CT scan is no longer well appreciated. On images 24-25 of series 3 the possibility of a fluid level in the CBD is raised, potentially from sludge. CBD caliber 7 mm, previously 10 mm. Gallbladder wall mildly thickened at 4 mm.  No abnormal pancreatic enhancement. Adrenal glands and spleen appear normal. No enhancing liver mass.  IMPRESSION: 1. I do not see the distal CBD calculi on today' s MRCP, with the understanding that there is extensive breathing motion artifact which reduces diagnostic sensitivity and specificity. CBD caliber is 7 mm, within normal limits for age. It is possible that the prior stones  have passed. 2. There gallstones in the neck of the gallbladder. Crossing vessel causes a false filling defect in the CBD near the cystic duct attachment. 3. Periampullary duodenal diverticulum. 4. Mild gallbladder wall thickening.   Electronically Signed   By: Sherryl Barters M.D.   On: 12/01/2013 14:51   Dg Chest Port 1 View  12/05/2013  CLINICAL DATA:  Wheezing, history of atrial fibrillation and diabetes  EXAM: PORTABLE CHEST - 1 VIEW  COMPARISON:  Portable chest x-ray of December 02, 2013  FINDINGS: The lungs are reasonably well inflated. There is no focal infiltrate. The interstitial markings are coarse and are more conspicuous today. There is stable calcified nodularity over the lateral aspect of the left sixth rib. The cardiac silhouette is mildly enlarged but stable. The central pulmonary vascularity is prominent without cephalization.  IMPRESSION: There has been slight interval increase in the conspicuity of the pulmonary interstitium which may reflect mild interstitial edema. A followup PA and lateral chest film would be useful when the patient can tolerate the procedure.   Electronically Signed   By: David  Martinique   On: 12/05/2013 08:47   Dg Chest Port 1 View  12/02/2013   CLINICAL DATA:  Wheezing  EXAM: PORTABLE CHEST - 1 VIEW  COMPARISON:  December 01, 2013  FINDINGS: There are calcified granulomas in the left mid lung. There is no edema or consolidation. Heart is mildly enlarged with pulmonary vascularity within normal limits. No adenopathy. No bone lesions.  IMPRESSION: No edema or consolidation. Calcified granulomas in left mid lung. Mild cardiac enlargement is stable.   Electronically Signed   By: Lowella Grip M.D.   On: 12/02/2013 13:25   Dg Chest Port 1 View  12/01/2013   CLINICAL DATA:  Chest pain  EXAM: PORTABLE CHEST - 1 VIEW  COMPARISON:  07/10/2011  FINDINGS: Mild cardiomegaly, similar to previous given differences in technique. There are low lung volumes with interstitial  crowding. There is a right infrahilar opacity which is small and indistinct. Abdominal CT has been ordered, and will likely encompass this area. Calcified granuloma in the left lung. No effusion, edema, or pneumothorax.  IMPRESSION: Indistinct opacity at the medial right base could reflect atelectasis or infectious infiltrate. This area may be encompassed on abdominal CT currently pending. If not, recommend chest x-ray after convalescence.   Electronically Signed   By: Jorje Guild M.D.   On: 12/01/2013 02:20   Mr Abd W/wo Cm/mrcp  12/01/2013   CLINICAL DATA:  Abdominal pain. Gallstones with choledocholithiasis on CT. Diabetes.  EXAM: MRI ABDOMEN WITHOUT AND WITH CONTRAST (INCLUDING MRCP)  TECHNIQUE: Multiplanar multisequence MR imaging of the abdomen was performed both before and after the administration of intravenous contrast. Heavily T2-weighted images of the biliary and pancreatic ducts were obtained, and three-dimensional MRCP images were rendered by post processing.  CONTRAST:  52mL MULTIHANCE GADOBENATE DIMEGLUMINE 529 MG/ML IV SOLN  COMPARISON:  12/01/2013  FINDINGS: Despite efforts by the technologist and patient, motion artifact is present on today's exam and could not be eliminated. This reduces exam sensitivity and specificity. The 1.8 x 1.2 cm nonenhancing T2 hyperintense structure in the right hepatic lobe, image 16 of series 5, compatible with cyst.  There are a number of filling defects in the neck of the gallbladder compatible with gallstones. A periampullary duodenal diverticulum is present.  On image 23 of series 3 there is a 4 mm apparent filling defect in the CBD near the cystic duct attachment, but on multiplanar assessment I think that this probably represents a crossing vessel causing volume averaging rather than a true stone. The previously seen distal CBD stone on CT scan is no longer well appreciated. On images 24-25 of series 3 the possibility of a fluid level in the CBD is raised,  potentially from sludge. CBD caliber 7 mm, previously 10 mm. Gallbladder wall  mildly thickened at 4 mm.  No abnormal pancreatic enhancement. Adrenal glands and spleen appear normal. No enhancing liver mass.  IMPRESSION: 1. I do not see the distal CBD calculi on today' s MRCP, with the understanding that there is extensive breathing motion artifact which reduces diagnostic sensitivity and specificity. CBD caliber is 7 mm, within normal limits for age. It is possible that the prior stones have passed. 2. There gallstones in the neck of the gallbladder. Crossing vessel causes a false filling defect in the CBD near the cystic duct attachment. 3. Periampullary duodenal diverticulum. 4. Mild gallbladder wall thickening.   Electronically Signed   By: Sherryl Barters M.D.   On: 12/01/2013 14:51    Microbiology: Recent Results (from the past 240 hour(s))  MRSA PCR SCREENING     Status: None   Collection Time    12/03/13  6:55 PM      Result Value Ref Range Status   MRSA by PCR NEGATIVE  NEGATIVE Final   Comment:            The GeneXpert MRSA Assay (FDA     approved for NASAL specimens     only), is one component of a     comprehensive MRSA colonization     surveillance program. It is not     intended to diagnose MRSA     infection nor to guide or     monitor treatment for     MRSA infections.     Labs: Basic Metabolic Panel:  Recent Labs Lab 12/06/13 0450 12/07/13 0446 12/09/13 0453 12/10/13 0503 12/11/13 0505  NA 136* 137 137 138 138  K 3.8 4.0 4.3 4.5 4.5  CL 98 97 96 97 95*  CO2 30 30 31  35* 37*  GLUCOSE 198* 227* 212* 192* 127*  BUN 17 17 28* 33* 34*  CREATININE 0.89 0.84 1.07 1.22 1.34  CALCIUM 8.3* 8.8 8.8 8.8 8.7   Liver Function Tests:  Recent Labs Lab 12/06/13 0450  AST 33  ALT 115*  ALKPHOS 142*  BILITOT 0.9  PROT 5.8*  ALBUMIN 2.7*   No results found for this basename: LIPASE, AMYLASE,  in the last 168 hours No results found for this basename: AMMONIA,  in the  last 168 hours CBC:  Recent Labs Lab 12/05/13 0553 12/05/13 1152 12/06/13 0022 12/06/13 0450 12/08/13 0427 12/11/13 0505  WBC 5.3  --   --  5.7 9.3 10.8*  HGB 12.8*  --   --  13.0 14.7 14.6  HCT 37.3*  --   --  39.3 43.4 43.8  MCV 88.8  --   --  90.3 89.5 92.2  PLT 129* 149* 144* 151 200 243   Cardiac Enzymes: No results found for this basename: CKTOTAL, CKMB, CKMBINDEX, TROPONINI,  in the last 168 hours BNP: BNP (last 3 results)  Recent Labs  12/02/13 1212  PROBNP 3926.0*   CBG:  Recent Labs Lab 12/10/13 0820 12/10/13 1636 12/10/13 2114 12/11/13 0742 12/11/13 1128  GLUCAP 165* 189* 175* 92 104*       Signed:  Malakye Nolden  [facility] 12/11/2013, 8:23 PM

## 2013-12-11 NOTE — Progress Notes (Signed)
[place] for Coumadin Indication: atrial fibrillation  No Known Allergies  Patient Measurements: Height: 6' (182.9 cm) Weight: 241 lb 6.5 oz (109.5 kg) IBW/kg (Calculated) : 77.6  Vital Signs: Temp: 97.5 F (36.4 C) (08/29 0806) Temp src: Oral (08/29 0806) BP: 129/63 mmHg (08/29 0845) Pulse Rate: 67 (08/29 0830)  Labs:  Recent Labs  12/09/13 0453 12/10/13 0503 12/11/13 0505  HGB  --   --  14.6  HCT  --   --  43.8  PLT  --   --  243  LABPROT 23.3* 27.0* 35.7*  INR 2.07* 2.50* 3.57*  CREATININE 1.07 1.22 1.34   Estimated Creatinine Clearance: 57.2 ml/min (by C-G formula based on Cr of 1.34).  Medical History: Past Medical History  Diagnosis Date  . Type 2 diabetes mellitus   . Essential hypertension, benign   . Atrial fibrillation     On coumadin  . Chronic kidney disease (CKD), stage III (moderate)   . Hypothyroidism   . Pulmonary hypertension 12/03/2013    PA pressure 56. Ejection fraction 55-60%  . History of cardiac catheterization     Details not certain - reportedly no interventions   Medications:  Prescriptions prior to admission  Medication Sig Dispense Refill  . aspirin 81 MG chewable tablet Chew 81 mg by mouth every other day.      . diltiazem (TIAZAC) 240 MG 24 hr capsule Take 240 mg by mouth daily.      . diphenhydrAMINE (BENADRYL) 25 MG tablet Take 25 mg by mouth every 6 (six) hours as needed. For sleep      . doxazosin (CARDURA) 2 MG tablet Take 2 mg by mouth at bedtime.      Marland Kitchen levothyroxine (SYNTHROID, LEVOTHROID) 50 MCG tablet Take 50 mcg by mouth daily.      Marland Kitchen lisinopril-hydrochlorothiazide (PRINZIDE,ZESTORETIC) 20-25 MG per tablet Take 1 tablet by mouth daily.      . metFORMIN (GLUCOPHAGE) 500 MG tablet Take 1,000 mg by mouth at bedtime.      . metoprolol succinate (TOPROL-XL) 25 MG 24 hr tablet Take 1 tablet (25 mg total) by mouth daily.  30 tablet  0  . pravastatin (PRAVACHOL) 40 MG tablet Take 80 mg  by mouth daily.      Marland Kitchen warfarin (COUMADIN) 3 MG tablet Take 3 mg by mouth daily.       Assessment: 80 yo M admitted with gallstone pancreatitis on 8/19.   He is on chronic Coumadin for Afib.  Home dose listed above.  INR was elevated on admission & Coumadin was held.  Initial plan was for laproscopic cholecystectomy & INR was trending down, however this procedure is being delayed until acute cardiology issues resolved.  He remained in Afib until TEE/DCCV completed today.  IV Amiodarone restarted.  No bleeding noted. INR now trending up and above goal range.  Goal of Therapy:  INR 2-3   Plan:  No warfarin today. INR daily.   Biagio Quint R 12/11/2013,9:28 AM

## 2013-12-11 NOTE — Progress Notes (Signed)
Pt ambulated in hallway today. At rest before starting walk, pt's HR was in the 80s. At rest on RA pt's oxygen saturation was 98%.   While ambulating, pt's HR was 120s-130s. While ambulating, pt's oxygen saturations never dipped below 93% on RA.    Returning to rest, pt's HR stabilized in the 80s within 3 minutes of rest and oxygen saturations returned to 95% on RA at rest. Will continue to monitor.

## 2013-12-11 NOTE — Progress Notes (Deleted)
Physician Discharge Summary  Gustavo Dispenza POE:[id number] DOB: [birth year] DOA: 12/01/2013  PCP: [name], MD  Admit date: 12/01/2013 Discharge date: 12/11/2013  Time spent: 40 minutes  Recommendations for Outpatient Follow-up:  1. Follow up with primary care doctor in 1 week to discuss referral to tertiary center for elective cholecystectomy 2. Follow up with cardiology in 1-2 weeks  Discharge Diagnoses:  Principal Problem:   Pancreatitis due to biliary obstruction Active Problems:   Type 2 diabetes mellitus with diabetic nephropathy   Hypertension   Atrial fibrillation with RVR   Choledocholithiasis   Chronic kidney disease (CKD), stage III (moderate)   Hypothyroidism   Cholelithiasis   Obesity, morbid   Transaminitis   Thrombocytopenia, unspecified   Pulmonary hypertension   DIC (disseminated intravascular coagulation)   Acute bronchitis   Discharge Condition: improved  Diet recommendation: low salt, low carb  Filed Weights   12/08/13 0500 12/10/13 0500 12/11/13 0500  Weight: 110.1 kg (242 lb 11.6 oz) 108.2 kg (238 lb 8.6 oz) 109.5 kg (241 lb 6.5 oz)    History of present illness and hospital course:  This patient was admitted to the hospital with abdominal pain. Was found to have a significantly elevated lipase consistent with pancreatitis. CT of the abdomen indicated cholelithiasis and gallbladder distention, as well as a 4 mm mid to distal common bile stone. Gastroenterology was consulted an ERCP was considered, but the patient was coagulopathic from Coumadin. MRCP was ordered and did not reveal any common bile duct stone. The suspicion was that his stone had passed. Cholecystectomy was recommended and patient was seen by general surgery. Patient's Coumadin was held and his INR was trending down. Unfortunately, he developed rapid atrial fibrillation. [name] felt that surgery should be changed to an elective status and recommended that the patient followup tertiary  care center due to his comorbidity conditions. This referral can be arranged by his primary care physician. He's not having any symptoms in terms of abdominal pain, nausea or vomiting. He is tolerating a solid diet without difficulty. Regarding his atrial fibrillation, he was placed on diltiazem and amiodarone infusions. He was seen by cardiology. Ultimately, he did require electrical cardioversion and has maintained sinus rhythm since then. He did have some shortness of breath which is likely related to COPD exacerbation. He was started on steroids for wheezing antibiotics and bronchodilators. His respiratory status appears back to baseline he is ambulating on room air without shortness of breath. On ambulation today, his heart rate was noted to increase to 120s, sinus rhythm. Case was discussed with cardiology and it was recommended to add low-dose diltiazem in addition to his amiodarone and Toprol. He continues anticoagulation with Coumadin. He will follow up with cardiology in the outpatient setting.   Procedures:  TEE/DCCV on 8/28 2-D echocardiogram 8/20: Impressions: - Moderate LVH with LVEF 55-60%, cannot exclude basal inferolateral hypokinesis. Indeterminate diastolic function. Upper normal left atrial chamber size. MAC with trivial mitral regurgitation Mildly ectatic aortic root. Mildly sclerotic aortic valve with mild aortic regurgitation. Moderate RV chamber dilatation with normal contraction. Elevated PASP 56 mm mercury with increased estimated CVP.  MRCP abdomen 12/01/13  CT abdomen 12/01/13   Consultations:  Cardiology  General surgery  Gastroenterology  Hematology  Discharge Exam: Filed Vitals:   12/11/13 1015  BP: 107/60  Pulse: 64  Temp:   Resp: 15    General: NAD Cardiovascular: S1, s2 RRR, 1+ edema b/l Respiratory: CTA B  Discharge Instructions You were cared for by a  hospitalist during your hospital stay. If you have any questions about your discharge  medications or the care you received while you were in the hospital after you are discharged, you can call the unit and asked to speak with the hospitalist on call if the hospitalist that took care of you is not available. Once you are discharged, your primary care physician will handle any further medical issues. Please note that NO REFILLS for any discharge medications will be authorized once you are discharged, as it is imperative that you return to your primary care physician (or establish a relationship with a primary care physician if you do not have one) for your aftercare needs so that they can reassess your need for medications and monitor your lab values.  Discharge Instructions   Call MD for:  difficulty breathing, headache or visual disturbances    Complete by:  As directed      Call MD for:  persistant dizziness or light-headedness    Complete by:  As directed      Call MD for:  severe uncontrolled pain    Complete by:  As directed      Diet - low sodium heart healthy    Complete by:  As directed      Diet Carb Modified    Complete by:  As directed      Face-to-face encounter (required for Medicare/Medicaid patients)    Complete by:  As directed   I Elycia Woodside certify that this patient is under my care and that I, or a nurse practitioner or physician's assistant working with me, had a face-to-face encounter that meets the physician face-to-face encounter requirements with this patient on 12/11/2013. The encounter with the patient was in whole, or in part for the following medical condition(s) which is the primary reason for home health care (List medical condition): patient with rapid atrial fibrillation requiring cardioversion, copd exacerbation, needs home health RN for disease management  The encounter with the patient was in whole, or in part, for the following medical condition, which is the primary reason for home health care:  rapid atrial fibrillation, copd  I certify that,  based on my findings, the following services are medically necessary home health services:  Nursing  My clinical findings support the need for the above services:  Shortness of breath with activity  Further, I certify that my clinical findings support that this patient is homebound due to:  Shortness of Breath with activity  Reason for Medically Necessary Home Health Services:  Skilled Nursing- Skilled Assessment/Observation     Home Health    Complete by:  As directed   To provide the following care/treatments:  RN     Increase activity slowly    Complete by:  As directed             Medication List    STOP taking these medications       diltiazem 240 MG 24 hr capsule  Commonly known as:  TIAZAC     lisinopril-hydrochlorothiazide 20-25 MG per tablet  Commonly known as:  PRINZIDE,ZESTORETIC      TAKE these medications       albuterol 108 (90 BASE) MCG/ACT inhaler  Commonly known as:  PROVENTIL HFA;VENTOLIN HFA  Inhale 2 puffs into the lungs every 6 (six) hours as needed for wheezing or shortness of breath.     amiodarone 200 MG tablet  Commonly known as:  PACERONE  Take 400mg  po bid for 1 week then 200mg  po  bid     aspirin 81 MG chewable tablet  Chew 81 mg by mouth every other day.     diltiazem 30 MG tablet  Commonly known as:  CARDIZEM  Take 1 tablet (30 mg total) by mouth 2 (two) times daily.     diphenhydrAMINE 25 MG tablet  Commonly known as:  BENADRYL  Take 25 mg by mouth every 6 (six) hours as needed. For sleep     doxazosin 2 MG tablet  Commonly known as:  CARDURA  Take 2 mg by mouth at bedtime.     furosemide 20 MG tablet  Commonly known as:  LASIX  Take 1 tablet (20 mg total) by mouth daily. Start on 12/14/13     levothyroxine 50 MCG tablet  Commonly known as:  SYNTHROID, LEVOTHROID  Take 50 mcg by mouth daily.     lisinopril 20 MG tablet  Commonly known as:  PRINIVIL,ZESTRIL  Take 1 tablet (20 mg total) by mouth daily.     metFORMIN 500 MG tablet   Commonly known as:  GLUCOPHAGE  Take 1,000 mg by mouth at bedtime.     metoprolol succinate 50 MG 24 hr tablet  Commonly known as:  TOPROL-XL  Take 1 tablet (50 mg total) by mouth daily. Take with or immediately following a meal.     potassium chloride 20 MEQ packet  Commonly known as:  KLOR-CON  Take 20 mEq by mouth daily. Start on 12/14/13     pravastatin 40 MG tablet  Commonly known as:  PRAVACHOL  Take 80 mg by mouth daily.     predniSONE 10 MG tablet  Commonly known as:  DELTASONE  Take 20mg  po daily for 2 days then 10mg  po daily for 2 days then stop     warfarin 3 MG tablet  Commonly known as:  COUMADIN  Take 3 mg by mouth daily.       No Known Allergies    The results of significant diagnostics from this hospitalization (including imaging, microbiology, ancillary and laboratory) are listed below for reference.    Significant Diagnostic Studies: Ct Abdomen Pelvis W Contrast  12/01/2013   CLINICAL DATA:  Abdominal pain.  EXAM: CT ABDOMEN AND PELVIS WITH CONTRAST  TECHNIQUE: Multidetector CT imaging of the abdomen and pelvis was performed using the standard protocol following bolus administration of intravenous contrast.  CONTRAST:  86mL OMNIPAQUE IOHEXOL 300 MG/ML SOLN, 26mL OMNIPAQUE IOHEXOL 300 MG/ML SOLN  COMPARISON:  None.  FINDINGS: LUNG BASES: Included view of the lung bases demonstrate mild pleural thickening and dependent atelectasis. Lingular atelectasis versus scarring. Heart size is normal, pericardium is unremarkable. Coronary artery calcifications present.  SOLID ORGANS: The pancreas and adrenal glands are unremarkable. Multiple subcentimeter gallstones without CT findings of acute cholecystitis. Mild gallbladder distention. 4 mm dense presumed stone within the mid to distal Common bile duct, coronal 56/119. 17 mm cyst in central right lobe of the liver, the liver is otherwise unremarkable. Spleen, adrenal glands are normal in appearance.  GASTROINTESTINAL TRACT:  The stomach, small and large bowel are normal in course and caliber without inflammatory changes. Sigmoid diverticulosis. Enteric contrast has not yet reached the distal small bowel. The appendix is not discretely identified, however there are no inflammatory changes in the right lower quadrant.  KIDNEYS/ URINARY TRACT: Kidneys are orthotopic, demonstrating symmetric enhancement. 4 mm right interpolar nephrolithiasis. No hydronephrosis or solid renal masses. 2.7 cm left interpolar cyst. Nonspecific bilateral perinephric stranding. The unopacified ureters are normal in course  and caliber. Delayed imaging through the kidneys demonstrates symmetric prompt contrast excretion within the proximal urinary collecting system. Urinary bladder is grossly unremarkable though predominately obscured by streak artifact from hip arthroplasties.  PERITONEUM/RETROPERITONEUM: No intraperitoneal free fluid nor free air. Aortoiliac vessels are normal in course and caliber, moderate calcific atherosclerosis. No lymphadenopathy by CT size criteria. Internal reproductive organs are unremarkable.  SOFT TISSUE/OSSEOUS STRUCTURES: Status post bilateral hip arthroplasties which results in streak artifact limiting assessment of the pelvic contents. Small fat containing umbilical hernia.  IMPRESSION: Cholelithiasis, gallbladder distention with 4 mm mid to distal Common bile duct stone. No CT findings of acute cholecystitis.  4 mm nonobstructing right nephrolithiasis.   Electronically Signed   By: Elon Alas   On: 12/01/2013 04:07   Mr 3d Recon At Scanner  12/01/2013   CLINICAL DATA:  Abdominal pain. Gallstones with choledocholithiasis on CT. Diabetes.  EXAM: MRI ABDOMEN WITHOUT AND WITH CONTRAST (INCLUDING MRCP)  TECHNIQUE: Multiplanar multisequence MR imaging of the abdomen was performed both before and after the administration of intravenous contrast. Heavily T2-weighted images of the biliary and pancreatic ducts were obtained, and  three-dimensional MRCP images were rendered by post processing.  CONTRAST:  7mL MULTIHANCE GADOBENATE DIMEGLUMINE 529 MG/ML IV SOLN  COMPARISON:  12/01/2013  FINDINGS: Despite efforts by the technologist and patient, motion artifact is present on today's exam and could not be eliminated. This reduces exam sensitivity and specificity. The 1.8 x 1.2 cm nonenhancing T2 hyperintense structure in the right hepatic lobe, image 16 of series 5, compatible with cyst.  There are a number of filling defects in the neck of the gallbladder compatible with gallstones. A periampullary duodenal diverticulum is present.  On image 23 of series 3 there is a 4 mm apparent filling defect in the CBD near the cystic duct attachment, but on multiplanar assessment I think that this probably represents a crossing vessel causing volume averaging rather than a true stone. The previously seen distal CBD stone on CT scan is no longer well appreciated. On images 24-25 of series 3 the possibility of a fluid level in the CBD is raised, potentially from sludge. CBD caliber 7 mm, previously 10 mm. Gallbladder wall mildly thickened at 4 mm.  No abnormal pancreatic enhancement. Adrenal glands and spleen appear normal. No enhancing liver mass.  IMPRESSION: 1. I do not see the distal CBD calculi on today' s MRCP, with the understanding that there is extensive breathing motion artifact which reduces diagnostic sensitivity and specificity. CBD caliber is 7 mm, within normal limits for age. It is possible that the prior stones have passed. 2. There gallstones in the neck of the gallbladder. Crossing vessel causes a false filling defect in the CBD near the cystic duct attachment. 3. Periampullary duodenal diverticulum. 4. Mild gallbladder wall thickening.   Electronically Signed   By: Sherryl Barters M.D.   On: 12/01/2013 14:51   Dg Chest Port 1 View  12/05/2013   CLINICAL DATA:  Wheezing, history of atrial fibrillation and diabetes  EXAM: PORTABLE  CHEST - 1 VIEW  COMPARISON:  Portable chest x-ray of December 02, 2013  FINDINGS: The lungs are reasonably well inflated. There is no focal infiltrate. The interstitial markings are coarse and are more conspicuous today. There is stable calcified nodularity over the lateral aspect of the left sixth rib. The cardiac silhouette is mildly enlarged but stable. The central pulmonary vascularity is prominent without cephalization.  IMPRESSION: There has been slight interval increase in the conspicuity of  the pulmonary interstitium which may reflect mild interstitial edema. A followup PA and lateral chest film would be useful when the patient can tolerate the procedure.   Electronically Signed   By: David  Martinique   On: 12/05/2013 08:47   Dg Chest Port 1 View  12/02/2013   CLINICAL DATA:  Wheezing  EXAM: PORTABLE CHEST - 1 VIEW  COMPARISON:  December 01, 2013  FINDINGS: There are calcified granulomas in the left mid lung. There is no edema or consolidation. Heart is mildly enlarged with pulmonary vascularity within normal limits. No adenopathy. No bone lesions.  IMPRESSION: No edema or consolidation. Calcified granulomas in left mid lung. Mild cardiac enlargement is stable.   Electronically Signed   By: Lowella Grip M.D.   On: 12/02/2013 13:25   Dg Chest Port 1 View  12/01/2013   CLINICAL DATA:  Chest pain  EXAM: PORTABLE CHEST - 1 VIEW  COMPARISON:  07/10/2011  FINDINGS: Mild cardiomegaly, similar to previous given differences in technique. There are low lung volumes with interstitial crowding. There is a right infrahilar opacity which is small and indistinct. Abdominal CT has been ordered, and will likely encompass this area. Calcified granuloma in the left lung. No effusion, edema, or pneumothorax.  IMPRESSION: Indistinct opacity at the medial right base could reflect atelectasis or infectious infiltrate. This area may be encompassed on abdominal CT currently pending. If not, recommend chest x-ray after  convalescence.   Electronically Signed   By: Jorje Guild M.D.   On: 12/01/2013 02:20   Mr Abd W/wo Cm/mrcp  12/01/2013   CLINICAL DATA:  Abdominal pain. Gallstones with choledocholithiasis on CT. Diabetes.  EXAM: MRI ABDOMEN WITHOUT AND WITH CONTRAST (INCLUDING MRCP)  TECHNIQUE: Multiplanar multisequence MR imaging of the abdomen was performed both before and after the administration of intravenous contrast. Heavily T2-weighted images of the biliary and pancreatic ducts were obtained, and three-dimensional MRCP images were rendered by post processing.  CONTRAST:  22mL MULTIHANCE GADOBENATE DIMEGLUMINE 529 MG/ML IV SOLN  COMPARISON:  12/01/2013  FINDINGS: Despite efforts by the technologist and patient, motion artifact is present on today's exam and could not be eliminated. This reduces exam sensitivity and specificity. The 1.8 x 1.2 cm nonenhancing T2 hyperintense structure in the right hepatic lobe, image 16 of series 5, compatible with cyst.  There are a number of filling defects in the neck of the gallbladder compatible with gallstones. A periampullary duodenal diverticulum is present.  On image 23 of series 3 there is a 4 mm apparent filling defect in the CBD near the cystic duct attachment, but on multiplanar assessment I think that this probably represents a crossing vessel causing volume averaging rather than a true stone. The previously seen distal CBD stone on CT scan is no longer well appreciated. On images 24-25 of series 3 the possibility of a fluid level in the CBD is raised, potentially from sludge. CBD caliber 7 mm, previously 10 mm. Gallbladder wall mildly thickened at 4 mm.  No abnormal pancreatic enhancement. Adrenal glands and spleen appear normal. No enhancing liver mass.  IMPRESSION: 1. I do not see the distal CBD calculi on today' s MRCP, with the understanding that there is extensive breathing motion artifact which reduces diagnostic sensitivity and specificity. CBD caliber is 7 mm,  within normal limits for age. It is possible that the prior stones have passed. 2. There gallstones in the neck of the gallbladder. Crossing vessel causes a false filling defect in the CBD near the cystic  duct attachment. 3. Periampullary duodenal diverticulum. 4. Mild gallbladder wall thickening.   Electronically Signed   By: Sherryl Barters M.D.   On: 12/01/2013 14:51    Microbiology: Recent Results (from the past 240 hour(s))  MRSA PCR SCREENING     Status: None   Collection Time    12/03/13  6:55 PM      Result Value Ref Range Status   MRSA by PCR NEGATIVE  NEGATIVE Final   Comment:            The GeneXpert MRSA Assay (FDA     approved for NASAL specimens     only), is one component of a     comprehensive MRSA colonization     surveillance program. It is not     intended to diagnose MRSA     infection nor to guide or     monitor treatment for     MRSA infections.     Labs: Basic Metabolic Panel:  Recent Labs Lab 12/06/13 0450 12/07/13 0446 12/09/13 0453 12/10/13 0503 12/11/13 0505  NA 136* 137 137 138 138  K 3.8 4.0 4.3 4.5 4.5  CL 98 97 96 97 95*  CO2 30 30 31  35* 37*  GLUCOSE 198* 227* 212* 192* 127*  BUN 17 17 28* 33* 34*  CREATININE 0.89 0.84 1.07 1.22 1.34  CALCIUM 8.3* 8.8 8.8 8.8 8.7   Liver Function Tests:  Recent Labs Lab 12/06/13 0450  AST 33  ALT 115*  ALKPHOS 142*  BILITOT 0.9  PROT 5.8*  ALBUMIN 2.7*   No results found for this basename: LIPASE, AMYLASE,  in the last 168 hours No results found for this basename: AMMONIA,  in the last 168 hours CBC:  Recent Labs Lab 12/05/13 0553 12/05/13 1152 12/06/13 0022 12/06/13 0450 12/08/13 0427 12/11/13 0505  WBC 5.3  --   --  5.7 9.3 10.8*  HGB 12.8*  --   --  13.0 14.7 14.6  HCT 37.3*  --   --  39.3 43.4 43.8  MCV 88.8  --   --  90.3 89.5 92.2  PLT 129* 149* 144* 151 200 243   Cardiac Enzymes: No results found for this basename: CKTOTAL, CKMB, CKMBINDEX, TROPONINI,  in the last 168  hours BNP: BNP (last 3 results)  Recent Labs  12/02/13 1212  PROBNP 3926.0*   CBG:  Recent Labs Lab 12/09/13 2141 12/10/13 0820 12/10/13 1636 12/10/13 2114 12/11/13 0742  GLUCAP 206* 165* 189* 175* 92       Signed:  Oluwanifemi Petitti  [facility] 12/11/2013, 10:35 AM

## 2013-12-13 NOTE — Progress Notes (Signed)
UR chart review completed.  

## 2013-12-24 ENCOUNTER — Encounter: Payer: Self-pay | Admitting: Cardiovascular Disease

## 2013-12-24 ENCOUNTER — Ambulatory Visit (INDEPENDENT_AMBULATORY_CARE_PROVIDER_SITE_OTHER): Payer: Medicare Other | Admitting: Cardiovascular Disease

## 2013-12-24 VITALS — BP 132/76 | HR 86 | Ht 72.0 in | Wt 213.0 lb

## 2013-12-24 DIAGNOSIS — E119 Type 2 diabetes mellitus without complications: Secondary | ICD-10-CM

## 2013-12-24 DIAGNOSIS — I272 Pulmonary hypertension, unspecified: Secondary | ICD-10-CM

## 2013-12-24 DIAGNOSIS — E038 Other specified hypothyroidism: Secondary | ICD-10-CM

## 2013-12-24 DIAGNOSIS — E1165 Type 2 diabetes mellitus with hyperglycemia: Secondary | ICD-10-CM

## 2013-12-24 DIAGNOSIS — I2789 Other specified pulmonary heart diseases: Secondary | ICD-10-CM

## 2013-12-24 DIAGNOSIS — I5032 Chronic diastolic (congestive) heart failure: Secondary | ICD-10-CM

## 2013-12-24 DIAGNOSIS — I4891 Unspecified atrial fibrillation: Secondary | ICD-10-CM

## 2013-12-24 DIAGNOSIS — E785 Hyperlipidemia, unspecified: Secondary | ICD-10-CM

## 2013-12-24 MED ORDER — AMIODARONE HCL 200 MG PO TABS: 200.0000 mg | ORAL_TABLET | Freq: Every day | ORAL | Status: DC

## 2013-12-24 NOTE — Progress Notes (Addendum)
Patient ID: Brett [name], male   DOB: 1932-11-04, 80 y.o.   MRN: [id number]      SUBJECTIVE: Patient presents for routine post hospitalization followup. He was admitted with acute biliary pancreatitis, rapid atrial fibrillation, and acute diastolic heart failure, and also had low grade DIC with additional PMH which includes hypothyroidism, and CKD stage III.  I performed transesophageal echocardiogram with subsequent cardioversion and normal sinus rhythm was maintained with amiodarone, Toprol-XL, and short acting diltiazem. He is on warfarin.  Echocardiogram on 8/20 showed the following: Moderate LVH with LVEF 55-60%, cannot exclude basal inferolateral hypokinesis. Indeterminate diastolic function. Upper normal left atrial chamber size. MAC with trivial mitral regurgitation. Mildly ectatic aortic root. Mildly sclerotic aortic valve with mild aortic regurgitation. Moderate RV chamber dilatation with normal contraction. Elevated PASP 56 mm mercury with increased estimated CVP.  He is doing much better and denies chest pain and shortness of breath. He very seldom has palpitations. He is gradually regaining his strength. ECG performed in the office today demonstrates normal sinus rhythm with first degree AV block, PR interval 296 ms, incomplete right bundle branch block with frequent PACs.  Hemoglobin A1c on 11/30/2013 was 6.5%. Lipid panel demonstrated total cholesterol 143, HDL 54, LDL 70, triglycerides 93. TSH normal at 1.23.  Review of Systems: As per "subjective", otherwise negative.  No Known Allergies  Current Outpatient Prescriptions  Medication Sig Dispense Refill  . albuterol (PROVENTIL HFA;VENTOLIN HFA) 108 (90 BASE) MCG/ACT inhaler Inhale 2 puffs into the lungs every 6 (six) hours as needed for wheezing or shortness of breath.  1 Inhaler  2  . amiodarone (PACERONE) 200 MG tablet Take 400mg  po bid for 1 week then 200mg  po bid  60 tablet  1  . aspirin 81 MG chewable tablet Chew 81  mg by mouth every other day.      . diltiazem (CARDIZEM) 30 MG tablet Take 1 tablet (30 mg total) by mouth 2 (two) times daily.  60 tablet  1  . diphenhydrAMINE (BENADRYL) 25 MG tablet Take 25 mg by mouth every 6 (six) hours as needed. For sleep      . doxazosin (CARDURA) 2 MG tablet Take 2 mg by mouth at bedtime.      Marland Kitchen levothyroxine (SYNTHROID, LEVOTHROID) 50 MCG tablet Take 50 mcg by mouth daily.      Marland Kitchen lisinopril (PRINIVIL,ZESTRIL) 20 MG tablet Take 1 tablet (20 mg total) by mouth daily.  30 tablet  1  . metFORMIN (GLUCOPHAGE) 500 MG tablet Take 1,000 mg by mouth at bedtime.      . metoprolol succinate (TOPROL-XL) 50 MG 24 hr tablet Take 1 tablet (50 mg total) by mouth daily. Take with or immediately following a meal.  30 tablet  1  . potassium chloride (KLOR-CON) 20 MEQ packet Take 20 mEq by mouth daily. Start on 12/14/13  30 tablet  1  . pravastatin (PRAVACHOL) 40 MG tablet Take 80 mg by mouth daily.      Marland Kitchen warfarin (COUMADIN) 3 MG tablet Take 3 mg by mouth daily.       No current facility-administered medications for this visit.    Past Medical History  Diagnosis Date  . Type 2 diabetes mellitus   . Essential hypertension, benign   . Atrial fibrillation     On coumadin  . Chronic kidney disease (CKD), stage III (moderate)   . Hypothyroidism   . Pulmonary hypertension 12/03/2013    PA pressure 56. Ejection fraction 55-60%  . History of  cardiac catheterization     Details not certain - reportedly no interventions    Past Surgical History  Procedure Laterality Date  . Replacement total hip w/  resurfacing implants Bilateral   . Colonoscopy      Remote > 10 years ago  . Tee without cardioversion N/A 12/10/2013    Procedure: TRANSESOPHAGEAL ECHOCARDIOGRAM (TEE);  Surgeon: Herminio Commons, MD;  Location: [facility];  Service: Endoscopy;  Laterality: N/A;  . Cardioversion N/A 12/10/2013    Procedure: CARDIOVERSION;  Surgeon: Herminio Commons, MD;  Location: [facility];  Service:  Endoscopy;  Laterality: N/A;    History   Social History  . Marital Status: Divorced    Spouse Name: N/A    Number of Children: N/A  . Years of Education: N/A   Occupational History  . Not on file.   Social History Main Topics  . Smoking status: Former Smoker    Types: Cigarettes  . Smokeless tobacco: Never Used  . Alcohol Use: Yes     Comment: Occasional  . Drug Use: No  . Sexual Activity: Not Currently   Other Topics Concern  . Not on file   Social History Narrative  . No narrative on file     Filed Vitals:   12/24/13 0831  BP: 132/76  Pulse: 86  Height: 6' (1.829 m)  Weight: 213 lb (96.616 kg)  SpO2: 97%    PHYSICAL EXAM General: NAD HEENT: Normal. Neck: No JVD, no thyromegaly. Lungs: Clear to auscultation bilaterally with normal respiratory effort. CV: Nondisplaced PMI.  Regular rate and rhythm with premature contractions noted, normal S1/S2, no S3/S4, no murmur. No pretibial or periankle edema.  No carotid bruit.  Normal pedal pulses.  Abdomen: Soft, nontender, no hepatosplenomegaly, no distention.  Neurologic: Alert and oriented x 3.  Psych: Normal affect. Skin: Normal. Musculoskeletal: Normal range of motion, no gross deformities. Extremities: No clubbing or cyanosis.   ECG: Most recent ECG reviewed.      ASSESSMENT AND PLAN: 1. Atrial fibrillation: Maintaining normal sinus rhythm. I will have him take amiodarone 200 mg twice daily for one more week and then reduced to 200 mg daily. I do not plan on having him continue this long-term. For the time being, I will continue short acting diltiazem 30 mg twice daily along with Toprol-XL 50 mg daily. Anticoagulation was maintained with warfarin. 2. Chronic diastolic heart failure: Euvolemic and stable. Occurred in setting of rapid atrial fibrillation. 3. Pulmonary HTN: Stable. 4. Hyperlipidemia: Lipid results noted above. Continue pravastatin 80 mg. 5. Type 2 diabetes mellitus: HbA1C 6.5% on 8/18.  Continue metformin. 6. Hypothyroidism: Normal TSH noted above. Continue Synthroid.  Dispo: f/u 2 months.  Kate Sable, M.D., F.A.C.C.  ADDENDUM: I was able to review outside records from [place], [place]. Pt also has a history of CAD with PCI of large 1st diagonal on 08/25/2007 with balloon angioplasty (no stent placed). Had 30-40% mid RCA disease and 20-30% mid LAD disease. LCx with only luminal irregularities. Performed by Dr. Chad Cordial.

## 2013-12-24 NOTE — Patient Instructions (Addendum)
Continue Amiodarone 200mg  twice a day x 1 more week, then decrease to 200mg  DAILY.   Remain off the Lasix Stop Potassium Continue all other medications.   Follow up in  2 months

## 2013-12-28 ENCOUNTER — Encounter: Payer: Self-pay | Admitting: Cardiovascular Disease

## 2014-01-10 ENCOUNTER — Other Ambulatory Visit: Payer: Self-pay | Admitting: *Deleted

## 2014-01-10 MED ORDER — DILTIAZEM HCL 30 MG PO TABS: 30.0000 mg | ORAL_TABLET | Freq: Two times a day (BID) | ORAL | Status: DC

## 2014-02-23 ENCOUNTER — Telehealth: Payer: Self-pay | Admitting: *Deleted

## 2014-02-23 ENCOUNTER — Ambulatory Visit (INDEPENDENT_AMBULATORY_CARE_PROVIDER_SITE_OTHER): Payer: Medicare Other | Admitting: Cardiovascular Disease

## 2014-02-23 ENCOUNTER — Encounter: Payer: Self-pay | Admitting: Cardiovascular Disease

## 2014-02-23 VITALS — BP 132/77 | HR 97 | Ht 72.0 in | Wt 216.0 lb

## 2014-02-23 DIAGNOSIS — E038 Other specified hypothyroidism: Secondary | ICD-10-CM

## 2014-02-23 DIAGNOSIS — I480 Paroxysmal atrial fibrillation: Secondary | ICD-10-CM | POA: Insufficient documentation

## 2014-02-23 DIAGNOSIS — I4891 Unspecified atrial fibrillation: Secondary | ICD-10-CM

## 2014-02-23 DIAGNOSIS — I10 Essential (primary) hypertension: Secondary | ICD-10-CM | POA: Insufficient documentation

## 2014-02-23 DIAGNOSIS — I2510 Atherosclerotic heart disease of native coronary artery without angina pectoris: Secondary | ICD-10-CM

## 2014-02-23 DIAGNOSIS — I270 Primary pulmonary hypertension: Secondary | ICD-10-CM

## 2014-02-23 DIAGNOSIS — E119 Type 2 diabetes mellitus without complications: Secondary | ICD-10-CM | POA: Insufficient documentation

## 2014-02-23 DIAGNOSIS — R000 Tachycardia, unspecified: Secondary | ICD-10-CM

## 2014-02-23 DIAGNOSIS — I5032 Chronic diastolic (congestive) heart failure: Secondary | ICD-10-CM

## 2014-02-23 DIAGNOSIS — E785 Hyperlipidemia, unspecified: Secondary | ICD-10-CM | POA: Insufficient documentation

## 2014-02-23 DIAGNOSIS — E1165 Type 2 diabetes mellitus with hyperglycemia: Secondary | ICD-10-CM

## 2014-02-23 DIAGNOSIS — I272 Pulmonary hypertension, unspecified: Secondary | ICD-10-CM

## 2014-02-23 DIAGNOSIS — E663 Overweight: Secondary | ICD-10-CM | POA: Insufficient documentation

## 2014-02-23 MED ORDER — DILTIAZEM HCL 30 MG PO TABS: 60.0000 mg | ORAL_TABLET | Freq: Two times a day (BID) | ORAL | Status: DC

## 2014-02-23 MED ORDER — RIVAROXABAN 20 MG PO TABS: 20.0000 mg | ORAL_TABLET | Freq: Every day | ORAL | Status: DC

## 2014-02-23 MED ORDER — DILTIAZEM HCL ER COATED BEADS 120 MG PO CP24: 120.0000 mg | ORAL_CAPSULE | Freq: Every day | ORAL | Status: DC

## 2014-02-23 NOTE — Telephone Encounter (Signed)
PATIENT ADVISED THAT A NEW RX WOULD BE SENT TO [place] JUST TO GET A PRICE CHECK. PATIENT ADVISED NOT TO PICK THIS RX UP FROM THE PHARMACY.

## 2014-02-23 NOTE — Progress Notes (Signed)
Patient ID: Brett [name], male   DOB: 1932-12-08, 80 y.o.   MRN: [id number]      SUBJECTIVE: The patient presents for follow-up of coronary artery disease, chronic diastolic heart failure, and atrial fibrillation. He underwent PCI of large 1st diagonal on 08/25/2007 with balloon angioplasty (no stent placed). Had 30-40% mid RCA disease and 20-30% mid LAD disease. LCx with only luminal irregularities. Performed by Dr. Chad Cordial in [place], [place]. The patient denies any symptoms of chest pain, palpitations, shortness of breath, lightheadedness, dizziness, leg swelling, orthopnea, PND, and syncope. He denies abdominal pain as well. When he takes his pulse and BP in the morning before taking his meds, his HR has been in the high 90 to low 100 bpm range. He said his INR, which is checked at his PCP's office ([name]), has also fluctuated somewhat and he would like to try a different anticoagulant if it is affordable.   Review of Systems: As per "subjective", otherwise negative.  No Known Allergies  Current Outpatient Prescriptions  Medication Sig Dispense Refill  . albuterol (PROVENTIL HFA;VENTOLIN HFA) 108 (90 BASE) MCG/ACT inhaler Inhale 2 puffs into the lungs every 6 (six) hours as needed for wheezing or shortness of breath. 1 Inhaler 2  . amiodarone (PACERONE) 200 MG tablet Take 1 tablet (200 mg total) by mouth daily. 30 tablet 6  . aspirin 81 MG chewable tablet Chew 81 mg by mouth every other day.    . diltiazem (CARDIZEM) 30 MG tablet Take 1 tablet (30 mg total) by mouth 2 (two) times daily. 60 tablet 6  . diphenhydrAMINE (BENADRYL) 25 MG tablet Take 25 mg by mouth every 6 (six) hours as needed. For sleep    . doxazosin (CARDURA) 2 MG tablet Take 2 mg by mouth at bedtime.    Marland Kitchen levothyroxine (SYNTHROID, LEVOTHROID) 50 MCG tablet Take 50 mcg by mouth daily.    Marland Kitchen lisinopril (PRINIVIL,ZESTRIL) 20 MG tablet Take 1 tablet (20 mg total) by mouth daily. 30 tablet 1  . metFORMIN (GLUCOPHAGE)  500 MG tablet Take 1,000 mg by mouth at bedtime.    . metoprolol succinate (TOPROL-XL) 50 MG 24 hr tablet Take 1 tablet (50 mg total) by mouth daily. Take with or immediately following a meal. 30 tablet 1  . pravastatin (PRAVACHOL) 40 MG tablet Take 80 mg by mouth daily.    Marland Kitchen warfarin (COUMADIN) 3 MG tablet Take 3 mg by mouth daily.     No current facility-administered medications for this visit.    Past Medical History  Diagnosis Date  . Type 2 diabetes mellitus   . Essential hypertension, benign   . Atrial fibrillation     On coumadin  . Chronic kidney disease (CKD), stage III (moderate)   . Hypothyroidism   . Pulmonary hypertension 12/03/2013    PA pressure 56. Ejection fraction 55-60%  . History of cardiac catheterization     Details not certain - reportedly no interventions    Past Surgical History  Procedure Laterality Date  . Replacement total hip w/  resurfacing implants Bilateral   . Colonoscopy      Remote > 10 years ago  . Tee without cardioversion N/A 12/10/2013    Procedure: TRANSESOPHAGEAL ECHOCARDIOGRAM (TEE);  Surgeon: Herminio Commons, MD;  Location: [facility];  Service: Endoscopy;  Laterality: N/A;  . Cardioversion N/A 12/10/2013    Procedure: CARDIOVERSION;  Surgeon: Herminio Commons, MD;  Location: [facility];  Service: Endoscopy;  Laterality: N/A;  History   Social History  . Marital Status: Divorced    Spouse Name: N/A    Number of Children: N/A  . Years of Education: N/A   Occupational History  . Not on file.   Social History Main Topics  . Smoking status: Former Smoker    Types: Cigarettes  . Smokeless tobacco: Never Used  . Alcohol Use: Yes     Comment: Occasional  . Drug Use: No  . Sexual Activity: Not Currently   Other Topics Concern  . Not on file   Social History Narrative     Filed Vitals:   02/23/14 1114  BP: 132/77  Pulse: 97  Height: 6' (1.829 m)  Weight: 216 lb (97.977 kg)    PHYSICAL EXAM General: NAD HEENT:  Normal. Neck: No JVD, no thyromegaly. Lungs: Clear to auscultation bilaterally with normal respiratory effort. CV: Nondisplaced PMI. Irregular rhythm, normal S1/S2, no S3, no murmur. No pretibial or periankle edema.  No carotid bruit.  Normal pedal pulses.  Abdomen: Soft, nontender, no hepatosplenomegaly, no distention.  Neurologic: Alert and oriented x 3.  Psych: Normal affect. Skin: Normal. Musculoskeletal: Normal range of motion, no gross deformities. Extremities: No clubbing or cyanosis.   ECG: Most recent ECG reviewed.      ASSESSMENT AND PLAN: 1. Atrial fibrillation: Given his elevated resting HR, I will increase diltiazem to 60 mg bid. When he has nearly run out of his pills, I have asked him to call us and I will prescribe 120 mg long-acting daily. I will discontinue amiodarone. Continue Toprol-XL 50 mg daily. Anticoagulation has been maintained with warfarin, but he is interested in switching to a new target-specific anticoagulant. I will see what Xarelto would cost him. 2. Chronic diastolic heart failure: Euvolemic and stable. Occurred in setting of rapid atrial fibrillation. Increase diltiazem dose as above to avoid heart failure exacerbation. 3. CAD: Stable ischemic heart disease. Continue aspirin 81 mg, Toprol-XL 50 mg, and pravastatin 80 mg daily. 4. Pulmonary HTN: Stable. No changes. 5. Hyperlipidemia: Lipid results previously reviewed and found to be within normal limits. Continue pravastatin 80 mg. 6. Type 2 diabetes mellitus: HbA1C 6.5% on 11/30/13. Continue metformin 1000 mg. 7. Hypothyroidism: Normal TSH. Continue Synthroid at current dose.  Dispo: f/u 6 months.  Kate Sable, M.D., F.A.C.C.

## 2014-02-23 NOTE — Patient Instructions (Signed)
Your physician recommends that you schedule a follow-up appointment in: 6 months. You will receive a reminder letter in the mail in about 4 months reminding you to call and schedule your appointment. If you don't receive this letter, please contact our office. Your physician has recommended you make the following change in your medication:  Stop amiodarone. Increase diltiazem to 60 mg twice daily until you finish your current supply. Start diltiazem er 120 mg daily when you finish your current supply of 60 mg. We will contact you with pricing options for xarelto.

## 2014-02-24 ENCOUNTER — Telehealth: Payer: Self-pay | Admitting: *Deleted

## 2014-02-24 NOTE — Telephone Encounter (Signed)
Patient informed that the price of xarelto is $45 per month. Patient informed that he can discuss this with Dr. Bronson Ing at this next office visit if he decides he wanted to switch to this anticoagulant.

## 2014-02-25 ENCOUNTER — Other Ambulatory Visit: Payer: Self-pay | Admitting: *Deleted

## 2014-02-25 MED ORDER — METOPROLOL SUCCINATE ER 50 MG PO TB24: 50.0000 mg | ORAL_TABLET | Freq: Every day | ORAL | Status: DC

## 2014-03-30 ENCOUNTER — Telehealth: Payer: Self-pay | Admitting: *Deleted

## 2014-03-30 NOTE — Telephone Encounter (Signed)
Received fax from [facility] requesting cardiac clearance for lap chole under general anesthesia.  Also, wanting clarification on holding his Warfarin 5 days before surgery.  (does not look like we manage his coumadin & has not changed to other anticoagulant yet)  Last seen 02/23/2014.

## 2014-03-31 NOTE — Telephone Encounter (Signed)
Note below faxed to [facility].

## 2014-03-31 NOTE — Telephone Encounter (Signed)
Had normal LV systolic function by echo in 08/2012. Hence, I think he can safely proceed with laparoscopic cholecystectomy under general anesthesia with an acceptable perioperative risk for major adverse cardiac events. At his last visit, he had considered switching from warfarin to Xarelto and we were going to look into what it would cost him. If he is indeed still taking warfarin, this will have to be managed by Dr. Irene Shipper office, as this is where it has been managed all this while.

## 2014-04-06 ENCOUNTER — Other Ambulatory Visit (INDEPENDENT_AMBULATORY_CARE_PROVIDER_SITE_OTHER): Payer: Self-pay | Admitting: Surgery

## 2014-04-06 NOTE — H&P (Signed)
Brett [name] 03/30/2014 8:37 AM Location: [facility] Patient #: [id number] DOB: [birth year] Single / Language: Cleophus Molt / Race: White Male  History of Present Illness Brett Hector MD; 03/30/2014 9:45 AM) Patient words: gallbladder.  The patient is a 80 year old male who presents for evaluation of gall stones. Pleasant obese male with numerous health issues sent to me for surgical consultation for concern of gallstone pancreatitis. Primary care physician [name]. Cardiologist: Kate Sable, M.D., F.A.C.C. Pleasant male with atrial fibrillation, chronic heart failure, chronic anticoagulation. Chronic kidney disease. Non-insulin-requiring diabetes. Other health issues. Developed episode of sharp abdominal pain in August. Felt like he was having a heart attack. [facility]. That was ruled out. Elevated liver function tests and lipase. CT scan concerning for gallstone pancreatitis with possible common bile duct stone. Admitted. MR CP did not see obvious common bile duct stone. Numbers improved. Went into a rapid ventricular rate. Required cardioversion and adjustment of his cardiac medications. Rate controlled. Discharge. Followed up with cardiology. No more intense. Because of gallstones, surgical consideration of cholecystectomy discussed at length. Sent to [facility] for more complex surgical care given his numerous health issues. Patient claims he can walk 30 minutes without difficulty. Has bowel movement about every other day. Has not had anymore attacks. Denies much heartburn or reflux. No history of gastritis. He no longer smokes. No history of inflammatory bowel disease or other gastrointestinal problems. No exertional chest pain or severe shortness of breath. 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 He occasionally has vodka once or twice a month. Not very often. No history of binge drinking. No history of hepatitis or  pancreatitis or other problems.   Other Problems Marjean Donna, CMA; 03/30/2014 8:37 AM) Arthritis Atrial Fibrillation Cholelithiasis Diabetes Mellitus High blood pressure Melanoma Thyroid Disease  Past Surgical History Marjean Donna, CMA; 03/30/2014 8:37 AM) Hip Surgery Bilateral.  Diagnostic Studies History Marjean Donna, CMA; 03/30/2014 8:37 AM) Colonoscopy 5-10 years ago  Allergies Davy Pique Bynum, CMA; 03/30/2014 8:38 AM) No Known Drug Allergies12/16/2015  Medication History (Sonya Bynum, CMA; 03/30/2014 8:39 AM) Amiodarone HCl (200MG  Tablet, Oral) Active. Cartia XT (240MG  Capsule ER 24HR, Oral) Active. Diltiazem HCl (30MG  Tablet, Oral) Active. Klor-Con M20 [facility] Tablet ER, Oral) Active. Lisinopril-Hydrochlorothiazide (20-25MG  Tablet, Oral) Active. MetFORMIN HCl (1000MG  Tablet, Oral) Active. Metoprolol Succinate ER (50MG  Tablet ER 24HR, Oral) Active. Synthroid (50MCG Tablet, Oral) Active. Warfarin Sodium (3MG  Tablet, Oral) Active. OneTouch Ultra Blue (In Vitro) Active. Furosemide (20MG  Tablet, Oral) Active. Doxazosin Mesylate (2MG  Tablet, Oral) Active. ProAir HFA (108 (90 Base)MCG/ACT Aerosol Soln, Inhalation) Active.  Social History Marjean Donna, [place]; 03/30/2014 8:37 AM) Alcohol use Remotely quit alcohol use. Caffeine use Carbonated beverages, Coffee, Tea. No drug use Tobacco use Former smoker.  Family History Marjean Donna, CMA; 03/30/2014 8:37 AM) Arthritis Daughter, Father, Son. Diabetes Mellitus Mother. Hypertension Father.  Review of Systems Davy Pique Bynum CMA; 03/30/2014 8:37 AM) General Not Present- Appetite Loss, Chills, Fatigue, Fever, Night Sweats, Weight Gain and Weight Loss. Skin Present- Dryness and Non-Healing Wounds. Not Present- Change in Wart/Mole, Hives, Jaundice, New Lesions, Rash and Ulcer. HEENT Present- Wears glasses/contact lenses. Not Present- Earache, Hearing Loss, Hoarseness, Nose Bleed, Oral Ulcers, Ringing in  the Ears, Seasonal Allergies, Sinus Pain, Sore Throat, Visual Disturbances and Yellow Eyes. Respiratory Present- Wheezing. Not Present- Bloody sputum, Chronic Cough, Difficulty Breathing and Snoring. Breast Not Present- Breast Mass, Breast Pain, Nipple Discharge and Skin Changes. Cardiovascular Not Present- Chest Pain, Difficulty Breathing Lying Down, Leg  Cramps, Palpitations, Rapid Heart Rate, Shortness of Breath and Swelling of Extremities. Gastrointestinal Present- Constipation. Not Present- Abdominal Pain, Bloating, Bloody Stool, Change in Bowel Habits, Chronic diarrhea, Difficulty Swallowing, Excessive gas, Gets full quickly at meals, Hemorrhoids, Indigestion, Nausea, Rectal Pain and Vomiting. Male Genitourinary Present- Impotence. Not Present- Blood in Urine, Change in Urinary Stream, Frequency, Nocturia, Painful Urination, Urgency and Urine Leakage. Musculoskeletal Not Present- Back Pain, Joint Pain, Joint Stiffness, Muscle Pain, Muscle Weakness and Swelling of Extremities. Neurological Not Present- Decreased Memory, Fainting, Headaches, Numbness, Seizures, Tingling, Tremor, Trouble walking and Weakness. Psychiatric Not Present- Anxiety, Bipolar, Change in Sleep Pattern, Depression, Fearful and Frequent crying. Endocrine Not Present- Cold Intolerance, Excessive Hunger, Hair Changes, Heat Intolerance, Hot flashes and New Diabetes. Hematology Present- Easy Bruising and Excessive bleeding. Not Present- Gland problems, HIV and Persistent Infections.   Vitals (Sonya Bynum CMA; 03/30/2014 8:38 AM) 03/30/2014 8:38 AM Weight: 219 lb Height: 72in Body Surface Area: 2.25 m Body Mass Index: 29.7 kg/m Temp.: 11F(Temporal)  Pulse: 79 (Regular)  BP: 128/76 (Sitting, Left Arm, Standard)    Physical Exam Brett Hector MD; 03/30/2014 9:45 AM) General Mental Status-Alert. General Appearance-Not in acute distress, Not Sickly. Orientation-Oriented X3. Hydration-Well  hydrated. Voice-Normal.  Integumentary Global Assessment Upon inspection and palpation of skin surfaces of the - Axillae: non-tender, no inflammation or ulceration, no drainage. and Distribution of scalp and body hair is normal. General Characteristics Temperature - normal warmth is noted.  Head and Neck Head-normocephalic, atraumatic with no lesions or palpable masses. Face Global Assessment - atraumatic, no absence of expression. Neck Global Assessment - no abnormal movements, no bruit auscultated on the right, no bruit auscultated on the left, no decreased range of motion, non-tender. Trachea-midline. Thyroid Gland Characteristics - non-tender.  Eye Eyeball - Left-Extraocular movements intact, No Nystagmus. Eyeball - Right-Extraocular movements intact, No Nystagmus. Cornea - Left-No Hazy. Cornea - Right-No Hazy. Sclera/Conjunctiva - Left-No scleral icterus, No Discharge. Sclera/Conjunctiva - Right-No scleral icterus, No Discharge. Pupil - Left-Direct reaction to light normal. Pupil - Right-Direct reaction to light normal.  ENMT Ears Pinna - Left - no drainage observed, no generalized tenderness observed. Right - no drainage observed, no generalized tenderness observed. Nose and Sinuses External Inspection of the Nose - no destructive lesion observed. Inspection of the nares - Left - quiet respiration. Right - quiet respiration. Mouth and Throat Lips - Upper Lip - no fissures observed, no pallor noted. Lower Lip - no fissures observed, no pallor noted. Nasopharynx - no discharge present. Oral Cavity/Oropharynx - Tongue - no dryness observed. Oral Mucosa - no cyanosis observed. Hypopharynx - no evidence of airway distress observed.  Chest and Lung Exam Inspection Movements - Normal and Symmetrical. Accessory muscles - No use of accessory muscles in breathing. Palpation Palpation of the chest reveals - Non-tender. Auscultation Breath sounds - Normal  and Clear.  Cardiovascular Auscultation Rhythm - Regular. Murmurs & Other Heart Sounds - Auscultation of the heart reveals - No Murmurs and No Systolic Clicks.  Abdomen Inspection Inspection of the abdomen reveals - No Visible peristalsis and No Abnormal pulsations. Umbilicus - No Bleeding, No Urine drainage. Palpation/Percussion Palpation and Percussion of the abdomen reveal - Soft, Non Tender, No Rebound tenderness, No Rigidity (guarding) and No Cutaneous hyperesthesia. Note: Moderate diastases recti supraumbilical. No abdominal pain. Some ecchymosis periumbilically consistent with prior heparin shots.   Male Genitourinary Sexual Maturity Tanner 5 - Adult hair pattern and Adult penile size and shape.  Peripheral Vascular Upper Extremity Inspection - Left -  No Cyanotic nailbeds, Not Ischemic. Right - No Cyanotic nailbeds, Not Ischemic.  Neurologic Neurologic evaluation reveals -normal attention span and ability to concentrate, able to name objects and repeat phrases. Appropriate fund of knowledge , normal sensation and normal coordination. Mental Status Affect - not angry, not paranoid. Cranial Nerves-Normal Bilaterally. Gait-Normal.  Neuropsychiatric Mental status exam performed with findings of-able to articulate well with normal speech/language, rate, volume and coherence, thought content normal with ability to perform basic computations and apply abstract reasoning and no evidence of hallucinations, delusions, obsessions or homicidal/suicidal ideation.  Musculoskeletal Global Assessment Spine, Ribs and Pelvis - no instability, subluxation or laxity. Right Upper Extremity - no instability, subluxation or laxity.  Lymphatic Head & Neck  General Head & Neck Lymphatics: Bilateral - Description - No Localized lymphadenopathy. Axillary  General Axillary Region: Bilateral - Description - No Localized lymphadenopathy. Femoral & Inguinal  Generalized Femoral &  Inguinal Lymphatics: Left - Description - No Localized lymphadenopathy. Right - Description - No Localized lymphadenopathy.    Assessment & Plan Brett Hector MD; 03/30/2014 9:41 AM) Kennyth Arnold STONE PANCREATITIS (577.0  K85.1) Impression: Monitored episode of gallstone pancreatitis as evidenced by elevated lipase and liver function tests with resolution. Suspicious for common bile duct on CT scan but negative on MRI. He is feeling better. 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  Standard care is to do an interval cholecystectomy to prevent further attacks. He is very interested in proceeding with this.  I want to be safe to make sure he can come off his warfarin safely and his cardiologist feels that it is safe to proceed with surgery. We'll get cardiac clearance. Because he has good exercise tolerance and has not had any issues since his discharge from the hospital over 2 months ago, he may just be a letter. We will see.  He would like to wait until January to proceed with surgery. That is reasonable. Would not want to wait much longer than that. I did caution if he has another severe attack, he needs to be admitted, allow the warfarin for off, get inpatient cardiac clearance, and inpatient surgery. Hopefully not likely since he's not had any more attacks in the past 2 months. We will see. Current Plans  Schedule for Surgery after cleared by cardiology Written instructions provided [facility] Consent - Cholecystectomy (Tyran Huser): discussed with patient and provided information. Pt Education - [facility] Good Bowel Health (Sabrina Keough) Pt Education - [facility] Laparosopic Post Op HCI (Lucion Dilger)   Signed by Brett Hector, MD (03/30/2014 9:47 AM)  Addendum: Patient has cardiac clearance.  Okay to hold warfarin 5 days prior to surgery  Brett [name], M.D., F.A.C.S. Gastrointestinal and Minimally Invasive Surgery [facility] [address] [place], [place] [id number] [phone] Main /  Paging

## 2014-08-18 ENCOUNTER — Ambulatory Visit (INDEPENDENT_AMBULATORY_CARE_PROVIDER_SITE_OTHER): Payer: Medicare Other | Admitting: Cardiovascular Disease

## 2014-08-18 ENCOUNTER — Encounter: Payer: Self-pay | Admitting: Cardiovascular Disease

## 2014-08-18 VITALS — BP 118/78 | HR 75 | Ht 72.0 in | Wt 215.0 lb

## 2014-08-18 DIAGNOSIS — I4891 Unspecified atrial fibrillation: Secondary | ICD-10-CM

## 2014-08-18 DIAGNOSIS — I2510 Atherosclerotic heart disease of native coronary artery without angina pectoris: Secondary | ICD-10-CM

## 2014-08-18 DIAGNOSIS — I272 Pulmonary hypertension, unspecified: Secondary | ICD-10-CM

## 2014-08-18 DIAGNOSIS — I5032 Chronic diastolic (congestive) heart failure: Secondary | ICD-10-CM

## 2014-08-18 DIAGNOSIS — E1165 Type 2 diabetes mellitus with hyperglycemia: Secondary | ICD-10-CM

## 2014-08-18 DIAGNOSIS — E038 Other specified hypothyroidism: Secondary | ICD-10-CM

## 2014-08-18 DIAGNOSIS — E785 Hyperlipidemia, unspecified: Secondary | ICD-10-CM

## 2014-08-18 DIAGNOSIS — I270 Primary pulmonary hypertension: Secondary | ICD-10-CM | POA: Diagnosis not present

## 2014-08-18 NOTE — Progress Notes (Signed)
Patient ID: Brett [name], male   DOB: February 09, 1933, 81 y.o.   MRN: [id number]      SUBJECTIVE: The patient presents for follow-up of coronary artery disease, chronic diastolic heart failure, and atrial fibrillation. He underwent PCI of large 1st diagonal on 08/25/2007 with balloon angioplasty (no stent placed). Had 30-40% mid RCA disease and 20-30% mid LAD disease. LCx with only luminal irregularities. Performed by Dr. Chad Cordial in [place], [place].  The patient denies any symptoms of chest pain, palpitations, shortness of breath, lightheadedness, dizziness, leg swelling, orthopnea, PND, and syncope. His gallbladder surgery went well. Xarelto is too expensive for him.   Review of Systems: As per "subjective", otherwise negative.  No Known Allergies  Current Outpatient Prescriptions  Medication Sig Dispense Refill  . aspirin 81 MG chewable tablet Chew 81 mg by mouth every other day.    . diltiazem (CARDIZEM CD) 120 MG 24 hr capsule Take 1 capsule (120 mg total) by mouth daily. 90 capsule 3  . diphenhydrAMINE (BENADRYL) 25 MG tablet Take 25 mg by mouth every 6 (six) hours as needed. For sleep    . doxazosin (CARDURA) 2 MG tablet Take 2 mg by mouth at bedtime.    Marland Kitchen levothyroxine (SYNTHROID, LEVOTHROID) 50 MCG tablet Take 50 mcg by mouth daily.    Marland Kitchen lisinopril (PRINIVIL,ZESTRIL) 20 MG tablet Take 1 tablet (20 mg total) by mouth daily. 30 tablet 1  . metFORMIN (GLUCOPHAGE) 500 MG tablet Take 1,000 mg by mouth at bedtime.    . metoprolol succinate (TOPROL-XL) 50 MG 24 hr tablet Take 1 tablet (50 mg total) by mouth daily. Take with or immediately following a meal. 30 tablet 6  . pravastatin (PRAVACHOL) 40 MG tablet Take 80 mg by mouth daily.    Marland Kitchen warfarin (COUMADIN) 3 MG tablet Take 3 mg by mouth daily.     No current facility-administered medications for this visit.    Past Medical History  Diagnosis Date  . Type 2 diabetes mellitus   . Essential hypertension, benign   . Atrial  fibrillation     On coumadin  . Chronic kidney disease (CKD), stage III (moderate)   . Hypothyroidism   . Pulmonary hypertension 12/03/2013    PA pressure 56. Ejection fraction 55-60%  . History of cardiac catheterization     Details not certain - reportedly no interventions    Past Surgical History  Procedure Laterality Date  . Replacement total hip w/  resurfacing implants Bilateral   . Colonoscopy      Remote > 10 years ago  . Tee without cardioversion N/A 12/10/2013    Procedure: TRANSESOPHAGEAL ECHOCARDIOGRAM (TEE);  Surgeon: Herminio Commons, MD;  Location: [facility];  Service: Endoscopy;  Laterality: N/A;  . Cardioversion N/A 12/10/2013    Procedure: CARDIOVERSION;  Surgeon: Herminio Commons, MD;  Location: [facility];  Service: Endoscopy;  Laterality: N/A;    History   Social History  . Marital Status: Divorced    Spouse Name: N/A  . Number of Children: N/A  . Years of Education: N/A   Occupational History  . Not on file.   Social History Main Topics  . Smoking status: Former Smoker -- 1.00 packs/day    Types: Cigarettes    Quit date: 08/18/1978  . Smokeless tobacco: Never Used  . Alcohol Use: 0.0 oz/week    0 Standard drinks or equivalent per week     Comment: Occasional  . Drug Use: No  . Sexual Activity: Not Currently  Other Topics Concern  . Not on file   Social History Narrative     Filed Vitals:   08/18/14 1018  BP: 118/78  Pulse: 75  Height: 6' (1.829 m)  Weight: 215 lb (97.523 kg)  SpO2: 98%    PHYSICAL EXAM General: NAD HEENT: Normal. Neck: No JVD, no thyromegaly. Lungs: Clear to auscultation bilaterally with normal respiratory effort. CV: Nondisplaced PMI. Irregular rhythm, normal S1/S2, no S3, no murmur. No pretibial or periankle edema. No carotid bruit. Normal pedal pulses.  Abdomen: Soft, nontender, no hepatosplenomegaly, no distention.  Neurologic: Alert and oriented x 3.  Psych: Normal affect. Skin: Normal. Musculoskeletal:  Normal range of motion, no gross deformities. Extremities: No clubbing or cyanosis.   ECG: Most recent ECG reviewed.      ASSESSMENT AND PLAN: 1. Atrial fibrillation: Symptomatically stable with adequate rate control on long-acting diltiazem 120 mg daily and metoprolol succinate 50 mg daily. Continue warfarin for anticoagulation with INR monitored by PCP. No changes to therapy indicated today.  2. Chronic diastolic heart failure: Euvolemic and stable. Occurred in setting of rapid atrial fibrillation. No changes to therapy.  3. CAD: Stable ischemic heart disease. Continue aspirin 81 mg, Toprol-XL 50 mg, and pravastatin 80 mg daily.  4. Pulmonary HTN: Stable. No changes.  5. Hyperlipidemia: Lipid results previously reviewed and found to be within normal limits. Continue pravastatin 80 mg.  6. Type 2 diabetes mellitus: HbA1C 6.5% on 11/30/13. Continue metformin 1000 mg.  7. Hypothyroidism: Continue Synthroid at current dose.  Dispo: f/u 6 months.    Kate Sable, M.D., F.A.C.C.

## 2014-08-18 NOTE — Patient Instructions (Signed)
Continue all current medications. Your physician wants you to follow up in: 6 months.  You will receive a reminder letter in the mail one-two months in advance.  If you don't receive a letter, please call our office to schedule the follow up appointment   

## 2015-02-15 ENCOUNTER — Ambulatory Visit (INDEPENDENT_AMBULATORY_CARE_PROVIDER_SITE_OTHER): Payer: Medicare Other | Admitting: Cardiovascular Disease

## 2015-02-15 ENCOUNTER — Encounter: Payer: Self-pay | Admitting: Cardiovascular Disease

## 2015-02-15 VITALS — BP 152/73 | HR 71 | Ht 72.0 in | Wt 224.0 lb

## 2015-02-15 DIAGNOSIS — E785 Hyperlipidemia, unspecified: Secondary | ICD-10-CM

## 2015-02-15 DIAGNOSIS — Z23 Encounter for immunization: Secondary | ICD-10-CM | POA: Diagnosis not present

## 2015-02-15 DIAGNOSIS — I10 Essential (primary) hypertension: Secondary | ICD-10-CM

## 2015-02-15 DIAGNOSIS — I4891 Unspecified atrial fibrillation: Secondary | ICD-10-CM | POA: Diagnosis not present

## 2015-02-15 DIAGNOSIS — I272 Other secondary pulmonary hypertension: Secondary | ICD-10-CM | POA: Diagnosis not present

## 2015-02-15 DIAGNOSIS — I2510 Atherosclerotic heart disease of native coronary artery without angina pectoris: Secondary | ICD-10-CM

## 2015-02-15 DIAGNOSIS — I5032 Chronic diastolic (congestive) heart failure: Secondary | ICD-10-CM

## 2015-02-15 MED ORDER — ASPIRIN EC 81 MG PO TBEC: 81.0000 mg | DELAYED_RELEASE_TABLET | Freq: Every day | ORAL | Status: DC

## 2015-02-15 MED ORDER — DILTIAZEM HCL ER COATED BEADS 120 MG PO CP24: 120.0000 mg | ORAL_CAPSULE | Freq: Every day | ORAL | Status: DC

## 2015-02-15 NOTE — Progress Notes (Signed)
Patient ID: Brett [name], male   DOB: Mar 11, 1933, 81 y.o.   MRN: [id number]      SUBJECTIVE: The patient presents for follow-up of coronary artery disease, chronic diastolic heart failure, and atrial fibrillation. He underwent PCI of large 1st diagonal on 08/25/2007 with balloon angioplasty (no stent placed). Had 30-40% mid RCA disease and 20-30% mid LAD disease. LCx with only luminal irregularities. Performed by Dr. Chad Cordial in [place], [place].  The patient denies any symptoms of chest pain, palpitations, shortness of breath, lightheadedness, dizziness, leg swelling, orthopnea, PND, and syncope.  Lipids on 12/05/14 showed total cholesterol 128, triglycerides 93, HDL and LDL both 55. Hemoglobin 14.2, platelets 199.  ECG performed in the office today demonstrates atrial fibrillation, heart rate 76 bpm, with an incomplete right bundle-branch block.     Review of Systems: As per "subjective", otherwise negative.  No Known Allergies  Current Outpatient Prescriptions  Medication Sig Dispense Refill  . diltiazem (CARDIZEM CD) 120 MG 24 hr capsule Take 1 capsule (120 mg total) by mouth daily. 90 capsule 3  . diphenhydrAMINE (BENADRYL) 25 MG tablet Take 25 mg by mouth every 6 (six) hours as needed. For sleep    . doxazosin (CARDURA) 2 MG tablet Take 2 mg by mouth at bedtime.    Marland Kitchen levothyroxine (SYNTHROID, LEVOTHROID) 50 MCG tablet Take 50 mcg by mouth daily.    Marland Kitchen lisinopril-hydrochlorothiazide (PRINZIDE,ZESTORETIC) 20-25 MG tablet Take 1 tablet by mouth daily.    . metFORMIN (GLUCOPHAGE) 500 MG tablet Take 1,000 mg by mouth at bedtime.    . metoprolol succinate (TOPROL-XL) 50 MG 24 hr tablet Take 1 tablet (50 mg total) by mouth daily. Take with or immediately following a meal. 30 tablet 6  . pravastatin (PRAVACHOL) 40 MG tablet Take 80 mg by mouth daily.    Marland Kitchen warfarin (COUMADIN) 3 MG tablet Take 3 mg by mouth daily.     No current facility-administered medications for this visit.     Past Medical History  Diagnosis Date  . Type 2 diabetes mellitus ([place])   . Essential hypertension, benign   . Atrial fibrillation ([place])     On coumadin  . Chronic kidney disease (CKD), stage III (moderate)   . Hypothyroidism   . Pulmonary hypertension ([place]) 12/03/2013    PA pressure 56. Ejection fraction 55-60%  . History of cardiac catheterization     Details not certain - reportedly no interventions    Past Surgical History  Procedure Laterality Date  . Replacement total hip w/  resurfacing implants Bilateral   . Colonoscopy      Remote > 10 years ago  . Tee without cardioversion N/A 12/10/2013    Procedure: TRANSESOPHAGEAL ECHOCARDIOGRAM (TEE);  Surgeon: Herminio Commons, MD;  Location: [facility];  Service: Endoscopy;  Laterality: N/A;  . Cardioversion N/A 12/10/2013    Procedure: CARDIOVERSION;  Surgeon: Herminio Commons, MD;  Location: [facility];  Service: Endoscopy;  Laterality: N/A;    Social History   Social History  . Marital Status: Divorced    Spouse Name: N/A  . Number of Children: N/A  . Years of Education: N/A   Occupational History  . Not on file.   Social History Main Topics  . Smoking status: Former Smoker -- 1.00 packs/day for 37 years    Types: Cigarettes    Start date: 11/01/1952    Quit date: 08/18/1978  . Smokeless tobacco: Never Used  . Alcohol Use: 0.0 oz/week    0 Standard  drinks or equivalent per week     Comment: Occasional  . Drug Use: No  . Sexual Activity: Not Currently   Other Topics Concern  . Not on file   Social History Narrative     Filed Vitals:   02/15/15 0954  BP: 152/73  Pulse: 71  Height: 6' (1.829 m)  Weight: 224 lb (101.606 kg)    PHYSICAL EXAM General: NAD HEENT: Normal. Neck: No JVD, no thyromegaly. Lungs: Clear to auscultation bilaterally with normal respiratory effort. CV: Nondisplaced PMI.  Regular rate and rhythm, normal S1/S2, no S3/S4, no murmur. No pretibial or periankle edema.  No carotid bruit.   Normal pedal pulses.  Abdomen: Soft, nontender, no hepatosplenomegaly, no distention.  Neurologic: Alert and oriented x 3.  Psych: Normal affect. Skin: Normal. Musculoskeletal: Normal range of motion, no gross deformities. Extremities: No clubbing or cyanosis.   ECG: Most recent ECG reviewed.      ASSESSMENT AND PLAN: 1. Atrial fibrillation: Symptomatically stable with adequate rate control on long-acting diltiazem 120 mg daily and metoprolol succinate 50 mg daily. Continue warfarin for anticoagulation with INR monitored by PCP. No changes to therapy indicated today.  2. Chronic diastolic heart failure: Euvolemic and stable. Occurred in setting of rapid atrial fibrillation. No changes to therapy.  3. CAD: Stable ischemic heart disease. Continue Toprol-XL 50 mg and pravastatin 80 mg daily. No longer taking ASA. Will resume at 81 mg daily. Informed to stop should he develop melena, hematochezia, or hematuria.  4. Pulmonary HTN: Stable. No changes.  5. Hyperlipidemia: Lipid results reviewed above and found to be within normal limits. Continue pravastatin 80 mg.  6. Essential HTN: Elevated but normally 120/70 at home. No changes.  Dispo: f/u 6 months.  Kate Sable, M.D., F.A.C.C.

## 2015-02-15 NOTE — Patient Instructions (Signed)
   Begin Aspirin 81mg  daily Continue all other medications.   Your physician wants you to follow up in: 6 months.  You will receive a reminder letter in the mail one-two months in advance.  If you don't receive a letter, please call our office to schedule the follow up appointment

## 2015-04-20 DIAGNOSIS — Z1283 Encounter for screening for malignant neoplasm of skin: Secondary | ICD-10-CM | POA: Diagnosis not present

## 2015-04-20 DIAGNOSIS — D225 Melanocytic nevi of trunk: Secondary | ICD-10-CM | POA: Diagnosis not present

## 2015-04-20 DIAGNOSIS — Z85820 Personal history of malignant melanoma of skin: Secondary | ICD-10-CM | POA: Diagnosis not present

## 2015-04-20 DIAGNOSIS — L570 Actinic keratosis: Secondary | ICD-10-CM | POA: Diagnosis not present

## 2015-04-20 DIAGNOSIS — Z08 Encounter for follow-up examination after completed treatment for malignant neoplasm: Secondary | ICD-10-CM | POA: Diagnosis not present

## 2015-04-20 DIAGNOSIS — C44519 Basal cell carcinoma of skin of other part of trunk: Secondary | ICD-10-CM | POA: Diagnosis not present

## 2015-04-24 ENCOUNTER — Telehealth: Payer: Self-pay | Admitting: Cardiovascular Disease

## 2015-04-24 MED ORDER — DOXAZOSIN MESYLATE 2 MG PO TABS: 2.0000 mg | ORAL_TABLET | Freq: Every day | ORAL | Status: DC

## 2015-04-24 MED ORDER — METOPROLOL SUCCINATE ER 50 MG PO TB24: 50.0000 mg | ORAL_TABLET | Freq: Every day | ORAL | Status: DC

## 2015-04-24 NOTE — Telephone Encounter (Signed)
Please call Mr. Plass in regards to RX questions Doxazosin 2mg  & Metoprolol 50 mg

## 2015-04-24 NOTE — Telephone Encounter (Signed)
Pt did not have questions just requesting these medications be refilled. Medication sent to pharmacy.

## 2015-04-27 DIAGNOSIS — Z5181 Encounter for therapeutic drug level monitoring: Secondary | ICD-10-CM | POA: Diagnosis not present

## 2015-04-27 DIAGNOSIS — Z79899 Other long term (current) drug therapy: Secondary | ICD-10-CM | POA: Diagnosis not present

## 2015-05-25 DIAGNOSIS — Z85828 Personal history of other malignant neoplasm of skin: Secondary | ICD-10-CM | POA: Diagnosis not present

## 2015-05-25 DIAGNOSIS — Z5181 Encounter for therapeutic drug level monitoring: Secondary | ICD-10-CM | POA: Diagnosis not present

## 2015-05-25 DIAGNOSIS — L820 Inflamed seborrheic keratosis: Secondary | ICD-10-CM | POA: Diagnosis not present

## 2015-05-25 DIAGNOSIS — Z08 Encounter for follow-up examination after completed treatment for malignant neoplasm: Secondary | ICD-10-CM | POA: Diagnosis not present

## 2015-05-25 DIAGNOSIS — Z79899 Other long term (current) drug therapy: Secondary | ICD-10-CM | POA: Diagnosis not present

## 2015-06-07 DIAGNOSIS — H3561 Retinal hemorrhage, right eye: Secondary | ICD-10-CM | POA: Diagnosis not present

## 2015-06-07 DIAGNOSIS — H353211 Exudative age-related macular degeneration, right eye, with active choroidal neovascularization: Secondary | ICD-10-CM | POA: Diagnosis not present

## 2015-06-07 DIAGNOSIS — H353112 Nonexudative age-related macular degeneration, right eye, intermediate dry stage: Secondary | ICD-10-CM | POA: Diagnosis not present

## 2015-06-29 DIAGNOSIS — Z5181 Encounter for therapeutic drug level monitoring: Secondary | ICD-10-CM | POA: Diagnosis not present

## 2015-06-29 DIAGNOSIS — Z79899 Other long term (current) drug therapy: Secondary | ICD-10-CM | POA: Diagnosis not present

## 2015-08-01 DIAGNOSIS — Z5181 Encounter for therapeutic drug level monitoring: Secondary | ICD-10-CM | POA: Diagnosis not present

## 2015-08-02 DIAGNOSIS — J180 Bronchopneumonia, unspecified organism: Secondary | ICD-10-CM | POA: Diagnosis not present

## 2015-08-11 DIAGNOSIS — E1165 Type 2 diabetes mellitus with hyperglycemia: Secondary | ICD-10-CM | POA: Diagnosis not present

## 2015-08-15 ENCOUNTER — Encounter: Payer: Self-pay | Admitting: Cardiovascular Disease

## 2015-08-15 ENCOUNTER — Ambulatory Visit (INDEPENDENT_AMBULATORY_CARE_PROVIDER_SITE_OTHER): Payer: Medicare HMO | Admitting: Cardiovascular Disease

## 2015-08-15 VITALS — BP 118/80 | HR 88 | Ht 72.0 in | Wt 220.0 lb

## 2015-08-15 DIAGNOSIS — I2510 Atherosclerotic heart disease of native coronary artery without angina pectoris: Secondary | ICD-10-CM | POA: Diagnosis not present

## 2015-08-15 DIAGNOSIS — I10 Essential (primary) hypertension: Secondary | ICD-10-CM

## 2015-08-15 DIAGNOSIS — I482 Chronic atrial fibrillation: Secondary | ICD-10-CM | POA: Diagnosis not present

## 2015-08-15 DIAGNOSIS — I4821 Permanent atrial fibrillation: Secondary | ICD-10-CM

## 2015-08-15 DIAGNOSIS — I272 Other secondary pulmonary hypertension: Secondary | ICD-10-CM | POA: Diagnosis not present

## 2015-08-15 DIAGNOSIS — I5032 Chronic diastolic (congestive) heart failure: Secondary | ICD-10-CM | POA: Diagnosis not present

## 2015-08-15 DIAGNOSIS — E785 Hyperlipidemia, unspecified: Secondary | ICD-10-CM

## 2015-08-15 NOTE — Patient Instructions (Signed)
Continue all current medications. Your physician wants you to follow up in:  1 year.  You will receive a reminder letter in the mail one-two months in advance.  If you don't receive a letter, please call our office to schedule the follow up appointment   

## 2015-08-15 NOTE — Progress Notes (Signed)
Patient ID: KIAN BASSINGER, male   DOB: 04/03/1933, 82 y.o.   MRN: [id number]      SUBJECTIVE: The patient presents for follow-up of coronary artery disease, chronic diastolic heart failure, and atrial fibrillation. He underwent PCI of large 1st diagonal on 08/25/2007 with balloon angioplasty (no stent placed). Had 30-40% mid RCA disease and 20-30% mid LAD disease. LCx with only luminal irregularities, performed by Dr. Chad Cordial in [place], [place].  The patient denies any symptoms of chest pain, palpitations, shortness of breath, lightheadedness, dizziness, leg swelling, orthopnea, PND, and syncope.  Recently got over upper respiratory infection.   Review of Systems: As per "subjective", otherwise negative.  No Known Allergies  Current Outpatient Prescriptions  Medication Sig Dispense Refill  . aspirin EC 81 MG tablet Take 1 tablet (81 mg total) by mouth daily.    Marland Kitchen diltiazem (CARTIA XT) 120 MG 24 hr capsule Take 120 mg by mouth daily.    . diphenhydrAMINE (BENADRYL) 25 MG tablet Take 25 mg by mouth every 6 (six) hours as needed. For sleep    . doxazosin (CARDURA) 2 MG tablet Take 1 tablet (2 mg total) by mouth at bedtime. 90 tablet 3  . levothyroxine (SYNTHROID, LEVOTHROID) 50 MCG tablet Take 50 mcg by mouth daily.    Marland Kitchen lisinopril-hydrochlorothiazide (PRINZIDE,ZESTORETIC) 20-12.5 MG tablet Take 1 tablet by mouth daily.    . metFORMIN (GLUCOPHAGE) 500 MG tablet Take 1,000 mg by mouth at bedtime.    . metoprolol succinate (TOPROL-XL) 50 MG 24 hr tablet Take 1 tablet (50 mg total) by mouth daily. Take with or immediately following a meal. 90 tablet 3  . pravastatin (PRAVACHOL) 40 MG tablet Take 80 mg by mouth daily.    Marland Kitchen warfarin (COUMADIN) 3 MG tablet Take 3 mg by mouth daily.     No current facility-administered medications for this visit.    Past Medical History  Diagnosis Date  . Type 2 diabetes mellitus ([place])   . Essential hypertension, benign   . Atrial fibrillation ([place])       On coumadin  . Chronic kidney disease (CKD), stage III (moderate)   . Hypothyroidism   . Pulmonary hypertension ([place]) 12/03/2013    PA pressure 56. Ejection fraction 55-60%  . History of cardiac catheterization     Details not certain - reportedly no interventions    Past Surgical History  Procedure Laterality Date  . Replacement total hip w/  resurfacing implants Bilateral   . Colonoscopy      Remote > 10 years ago  . Tee without cardioversion N/A 12/10/2013    Procedure: TRANSESOPHAGEAL ECHOCARDIOGRAM (TEE);  Surgeon: Herminio Commons, MD;  Location: [facility];  Service: Endoscopy;  Laterality: N/A;  . Cardioversion N/A 12/10/2013    Procedure: CARDIOVERSION;  Surgeon: Herminio Commons, MD;  Location: [facility];  Service: Endoscopy;  Laterality: N/A;    Social History   Social History  . Marital Status: Divorced    Spouse Name: N/A  . Number of Children: N/A  . Years of Education: N/A   Occupational History  . Not on file.   Social History Main Topics  . Smoking status: Former Smoker -- 1.00 packs/day for 37 years    Types: Cigarettes    Start date: 11/01/1952    Quit date: 08/18/1978  . Smokeless tobacco: Never Used  . Alcohol Use: 0.0 oz/week    0 Standard drinks or equivalent per week     Comment: Occasional  . Drug Use:  No  . Sexual Activity: Not Currently   Other Topics Concern  . Not on file   Social History Narrative     Filed Vitals:   08/15/15 1123  BP: 118/80  Pulse: 88  Height: 6' (1.829 m)  Weight: 220 lb (99.791 kg)  SpO2: 96%    PHYSICAL EXAM General: NAD HEENT: Normal. Neck: No JVD, no thyromegaly. Lungs: Clear to auscultation bilaterally with normal respiratory effort. CV: Nondisplaced PMI.  Regular rate and irregular rhythm, normal S1/S2, no S3, no murmur. No pretibial or periankle edema.  No carotid bruit.   Abdomen: Soft, nontender, no distention.  Neurologic: Alert and oriented.  Psych: Normal affect. Skin:  Normal. Musculoskeletal: No gross deformities.  ECG: Most recent ECG reviewed.      ASSESSMENT AND PLAN: 1. Permanent atrial fibrillation: Symptomatically stable with adequate rate control on long-acting diltiazem 120 mg daily and metoprolol succinate 50 mg daily. Continue warfarin for anticoagulation with INR monitored by PCP. No changes to therapy indicated today.  2. Chronic diastolic heart failure: Euvolemic and stable. Occurred in setting of rapid atrial fibrillation. No changes to therapy.  3. CAD: Stable ischemic heart disease. Continue ASA, Toprol-XL 50 mg, and pravastatin 80 mg daily.   4. Pulmonary HTN: Stable. No changes.  5. Hyperlipidemia: Continue pravastatin 80 mg.  6. Essential HTN: Controlled. No changes.  Dispo: f/u 1 year.   Kate Sable, M.D., F.A.C.C.

## 2015-08-31 DIAGNOSIS — H353211 Exudative age-related macular degeneration, right eye, with active choroidal neovascularization: Secondary | ICD-10-CM | POA: Diagnosis not present

## 2015-08-31 DIAGNOSIS — H353221 Exudative age-related macular degeneration, left eye, with active choroidal neovascularization: Secondary | ICD-10-CM | POA: Diagnosis not present

## 2015-09-05 DIAGNOSIS — Z79899 Other long term (current) drug therapy: Secondary | ICD-10-CM | POA: Diagnosis not present

## 2015-09-05 DIAGNOSIS — Z5181 Encounter for therapeutic drug level monitoring: Secondary | ICD-10-CM | POA: Diagnosis not present

## 2015-09-21 DIAGNOSIS — E119 Type 2 diabetes mellitus without complications: Secondary | ICD-10-CM | POA: Diagnosis not present

## 2015-09-23 DIAGNOSIS — R69 Illness, unspecified: Secondary | ICD-10-CM | POA: Diagnosis not present

## 2015-10-03 DIAGNOSIS — Z79899 Other long term (current) drug therapy: Secondary | ICD-10-CM | POA: Diagnosis not present

## 2015-10-03 DIAGNOSIS — Z5181 Encounter for therapeutic drug level monitoring: Secondary | ICD-10-CM | POA: Diagnosis not present

## 2015-11-01 DIAGNOSIS — H2512 Age-related nuclear cataract, left eye: Secondary | ICD-10-CM | POA: Diagnosis not present

## 2015-11-01 DIAGNOSIS — Z5181 Encounter for therapeutic drug level monitoring: Secondary | ICD-10-CM | POA: Diagnosis not present

## 2015-11-01 DIAGNOSIS — H538 Other visual disturbances: Secondary | ICD-10-CM | POA: Diagnosis not present

## 2015-11-01 DIAGNOSIS — Z79899 Other long term (current) drug therapy: Secondary | ICD-10-CM | POA: Diagnosis not present

## 2015-11-06 NOTE — Patient Instructions (Signed)
Your procedure is scheduled on: 11/13/2015  Report to [facility] at  64   AM.  Call this number if you have problems the morning of surgery: [phone]   Do not eat food or drink liquids :After Midnight.      Take these medicines the morning of surgery with A SIP OF WATER: cartia, cardura, synthroid, metoprolol.   Do not wear jewelry, make-up or nail polish.  Do not wear lotions, powders, or perfumes. You may wear deodorant.  Do not shave 48 hours prior to surgery.  Do not bring valuables to the hospital.  Contacts, dentures or bridgework may not be worn into surgery.  Leave suitcase in the car. After surgery it may be brought to your room.  For patients admitted to the hospital, checkout time is 11:00 AM the day of discharge.   Patients discharged the day of surgery will not be allowed to drive home.  :     Please read over the following fact sheets that you were given: Coughing and Deep Breathing, Surgical Site Infection Prevention, Anesthesia Post-op Instructions and Care and Recovery After Surgery    Cataract A cataract is a clouding of the lens of the eye. When a lens becomes cloudy, vision is reduced based on the degree and nature of the clouding. Many cataracts reduce vision to some degree. Some cataracts make people more near-sighted as they develop. Other cataracts increase glare. Cataracts that are ignored and become worse can sometimes look white. The white color can be seen through the pupil. CAUSES   Aging. However, cataracts may occur at any age, even in newborns.   Certain drugs.   Trauma to the eye.   Certain diseases such as diabetes.   Specific eye diseases such as chronic inflammation inside the eye or a sudden attack of a rare form of glaucoma.   Inherited or acquired medical problems.  SYMPTOMS   Gradual, progressive drop in vision in the affected eye.   Severe, rapid visual loss. This most often happens when trauma is the cause.  DIAGNOSIS  To detect a  cataract, an eye doctor examines the lens. Cataracts are best diagnosed with an exam of the eyes with the pupils enlarged (dilated) by drops.  TREATMENT  For an early cataract, vision may improve by using different eyeglasses or stronger lighting. If that does not help your vision, surgery is the only effective treatment. A cataract needs to be surgically removed when vision loss interferes with your everyday activities, such as driving, reading, or watching TV. A cataract may also have to be removed if it prevents examination or treatment of another eye problem. Surgery removes the cloudy lens and usually replaces it with a substitute lens (intraocular lens, IOL).  At a time when both you and your doctor agree, the cataract will be surgically removed. If you have cataracts in both eyes, only one is usually removed at a time. This allows the operated eye to heal and be out of danger from any possible problems after surgery (such as infection or poor wound healing). In rare cases, a cataract may be doing damage to your eye. In these cases, your caregiver may advise surgical removal right away. The vast majority of people who have cataract surgery have better vision afterward. HOME CARE INSTRUCTIONS  If you are not planning surgery, you may be asked to do the following:  Use different eyeglasses.   Use stronger or brighter lighting.   Ask your eye doctor about reducing  your medicine dose or changing medicines if it is thought that a medicine caused your cataract. Changing medicines does not make the cataract go away on its own.   Become familiar with your surroundings. Poor vision can lead to injury. Avoid bumping into things on the affected side. You are at a higher risk for tripping or falling.   Exercise extreme care when driving or operating machinery.   Wear sunglasses if you are sensitive to bright light or experiencing problems with glare.  SEEK IMMEDIATE MEDICAL CARE IF:   You have a  worsening or sudden vision loss.   You notice redness, swelling, or increasing pain in the eye.   You have a fever.  Document Released: 04/01/2005 Document Revised: 03/21/2011 Document Reviewed: 11/23/2010 [facility] Patient Information 2012 [place].PATIENT INSTRUCTIONS POST-ANESTHESIA  IMMEDIATELY FOLLOWING SURGERY:  Do not drive or operate machinery for the first twenty four hours after surgery.  Do not make any important decisions for twenty four hours after surgery or while taking narcotic pain medications or sedatives.  If you develop intractable nausea and vomiting or a severe headache please notify your doctor immediately.  FOLLOW-UP:  Please make an appointment with your surgeon as instructed. You do not need to follow up with anesthesia unless specifically instructed to do so.  WOUND CARE INSTRUCTIONS (if applicable):  Keep a dry clean dressing on the anesthesia/puncture wound site if there is drainage.  Once the wound has quit draining you may leave it open to air.  Generally you should leave the bandage intact for twenty four hours unless there is drainage.  If the epidural site drains for more than 36-48 hours please call the anesthesia department.  QUESTIONS?:  Please feel free to call your physician or the hospital operator if you have any questions, and they will be happy to assist you.

## 2015-11-07 ENCOUNTER — Encounter (HOSPITAL_COMMUNITY): Payer: Self-pay

## 2015-11-07 ENCOUNTER — Other Ambulatory Visit: Payer: Self-pay

## 2015-11-07 ENCOUNTER — Encounter (HOSPITAL_COMMUNITY)
Admission: RE | Admit: 2015-11-07 | Discharge: 2015-11-07 | Disposition: A | Discharge status: Home / Self Care | Payer: Medicare HMO | Source: Ambulatory Visit | Attending: Ophthalmology | Admitting: Ophthalmology

## 2015-11-07 DIAGNOSIS — Z01810 Encounter for preprocedural cardiovascular examination: Secondary | ICD-10-CM | POA: Insufficient documentation

## 2015-11-07 DIAGNOSIS — Z01812 Encounter for preprocedural laboratory examination: Secondary | ICD-10-CM | POA: Diagnosis not present

## 2015-11-07 HISTORY — DX: Unspecified macular degeneration: H35.30

## 2015-11-07 HISTORY — DX: Cardiac arrhythmia, unspecified: I49.9

## 2015-11-07 LAB — CBC WITH DIFFERENTIAL/PLATELET
BASOS ABS: 0 10*3/uL (ref 0.0–0.1)
BASOS PCT: 0 %
Eosinophils Absolute: 0.1 10*3/uL (ref 0.0–0.7)
Eosinophils Relative: 2 %
HEMATOCRIT: 40.5 % (ref 39.0–52.0)
HEMOGLOBIN: 13.5 g/dL (ref 13.0–17.0)
Lymphocytes Relative: 29 %
Lymphs Abs: 2 10*3/uL (ref 0.7–4.0)
MCH: 30.1 pg (ref 26.0–34.0)
MCHC: 33.3 g/dL (ref 30.0–36.0)
MCV: 90.2 fL (ref 78.0–100.0)
MONOS PCT: 8 %
Monocytes Absolute: 0.5 10*3/uL (ref 0.1–1.0)
NEUTROS ABS: 4.3 10*3/uL (ref 1.7–7.7)
NEUTROS PCT: 61 %
Platelets: 185 10*3/uL (ref 150–400)
RBC: 4.49 MIL/uL (ref 4.22–5.81)
RDW: 12.5 % (ref 11.5–15.5)
WBC: 7 10*3/uL (ref 4.0–10.5)

## 2015-11-07 LAB — BASIC METABOLIC PANEL
ANION GAP: 6 (ref 5–15)
BUN: 24 mg/dL — ABNORMAL HIGH (ref 6–20)
CHLORIDE: 103 mmol/L (ref 101–111)
CO2: 28 mmol/L (ref 22–32)
Calcium: 8.7 mg/dL — ABNORMAL LOW (ref 8.9–10.3)
Creatinine, Ser: 1.22 mg/dL (ref 0.61–1.24)
GFR calc non Af Amer: 54 mL/min — ABNORMAL LOW (ref >60)
Glucose, Bld: 121 mg/dL — ABNORMAL HIGH (ref 65–99)
POTASSIUM: 3.8 mmol/L (ref 3.5–5.1)
SODIUM: 137 mmol/L (ref 135–145)

## 2015-11-07 NOTE — Pre-Procedure Instructions (Signed)
Patient given information to sign up for my chart at home. 

## 2015-11-13 ENCOUNTER — Ambulatory Visit (HOSPITAL_COMMUNITY): Payer: Medicare HMO | Admitting: Anesthesiology

## 2015-11-13 ENCOUNTER — Encounter (HOSPITAL_COMMUNITY): Payer: Self-pay | Admitting: *Deleted

## 2015-11-13 ENCOUNTER — Encounter (HOSPITAL_COMMUNITY)
Admission: RE | Disposition: A | Discharge status: Home / Self Care | Payer: Self-pay | Source: Ambulatory Visit | Attending: Ophthalmology

## 2015-11-13 ENCOUNTER — Ambulatory Visit (HOSPITAL_COMMUNITY)
Admission: RE | Admit: 2015-11-13 | Discharge: 2015-11-13 | Disposition: A | Discharge status: Home / Self Care | Payer: Medicare HMO | Source: Ambulatory Visit | Attending: Ophthalmology | Admitting: Ophthalmology

## 2015-11-13 DIAGNOSIS — H2512 Age-related nuclear cataract, left eye: Secondary | ICD-10-CM | POA: Diagnosis not present

## 2015-11-13 DIAGNOSIS — E119 Type 2 diabetes mellitus without complications: Secondary | ICD-10-CM | POA: Diagnosis not present

## 2015-11-13 DIAGNOSIS — Z7901 Long term (current) use of anticoagulants: Secondary | ICD-10-CM | POA: Diagnosis not present

## 2015-11-13 DIAGNOSIS — I4891 Unspecified atrial fibrillation: Secondary | ICD-10-CM | POA: Insufficient documentation

## 2015-11-13 DIAGNOSIS — I10 Essential (primary) hypertension: Secondary | ICD-10-CM | POA: Diagnosis not present

## 2015-11-13 DIAGNOSIS — H538 Other visual disturbances: Secondary | ICD-10-CM | POA: Diagnosis not present

## 2015-11-13 DIAGNOSIS — H269 Unspecified cataract: Secondary | ICD-10-CM | POA: Insufficient documentation

## 2015-11-13 DIAGNOSIS — N182 Chronic kidney disease, stage 2 (mild): Secondary | ICD-10-CM | POA: Diagnosis not present

## 2015-11-13 HISTORY — PX: CATARACT EXTRACTION W/PHACO: SHX586

## 2015-11-13 LAB — GLUCOSE, CAPILLARY: Glucose-Capillary: 158 mg/dL — ABNORMAL HIGH (ref 65–99)

## 2015-11-13 SURGERY — PHACOEMULSIFICATION, CATARACT, WITH IOL INSERTION
Anesthesia: Monitor Anesthesia Care | Laterality: Left

## 2015-11-13 MED ORDER — TETRACAINE HCL 0.5 % OP SOLN
OPHTHALMIC | Status: AC
  Filled 2015-11-13: qty 4

## 2015-11-13 MED ORDER — BSS IO SOLN
INTRAOCULAR | Status: DC | PRN
  Administered 2015-11-13: 15 mL

## 2015-11-13 MED ORDER — PHENYLEPHRINE-KETOROLAC 1-0.3 % IO SOLN
INTRAOCULAR | Status: AC
  Filled 2015-11-13: qty 4

## 2015-11-13 MED ORDER — PHENYLEPHRINE-KETOROLAC 1-0.3 % IO SOLN
INTRAOCULAR | Status: DC | PRN
  Administered 2015-11-13: 500 mL via OPHTHALMIC

## 2015-11-13 MED ORDER — LACTATED RINGERS IV SOLN
INTRAVENOUS | Status: DC | PRN
  Administered 2015-11-13: 07:00:00 via INTRAVENOUS

## 2015-11-13 MED ORDER — CYCLOPENTOLATE-PHENYLEPHRINE OP SOLN OPTIME - NO CHARGE
OPHTHALMIC | Status: AC
  Filled 2015-11-13: qty 2

## 2015-11-13 MED ORDER — POVIDONE-IODINE 5 % OP SOLN
OPHTHALMIC | Status: DC | PRN
  Administered 2015-11-13: 1 via OPHTHALMIC

## 2015-11-13 MED ORDER — CYCLOPENTOLATE-PHENYLEPHRINE 0.2-1 % OP SOLN
1.0000 [drp] | OPHTHALMIC | Status: AC
  Administered 2015-11-13 (×3): 1 [drp] via OPHTHALMIC

## 2015-11-13 MED ORDER — LIDOCAINE HCL 3.5 % OP GEL
OPHTHALMIC | Status: DC | PRN
Start: 2015-11-13 — End: 2015-11-13
  Administered 2015-11-13: 1 via OPHTHALMIC

## 2015-11-13 MED ORDER — LIDOCAINE HCL 3.5 % OP GEL
OPHTHALMIC | Status: AC
  Filled 2015-11-13: qty 1

## 2015-11-13 MED ORDER — LIDOCAINE HCL 3.5 % OP GEL: 1.0000 "application " | Freq: Once | OPHTHALMIC | Status: DC

## 2015-11-13 MED ORDER — NA HYALUR & NA CHOND-NA HYALUR 0.55-0.5 ML IO KIT
PACK | INTRAOCULAR | Status: DC | PRN
  Administered 2015-11-13: 1 via OPHTHALMIC

## 2015-11-13 MED ORDER — TETRACAINE HCL 0.5 % OP SOLN
1.0000 [drp] | OPHTHALMIC | Status: AC
  Administered 2015-11-13 (×3): 1 [drp] via OPHTHALMIC

## 2015-11-13 SURGICAL SUPPLY — 7 items
CLOTH BEACON ORANGE TIMEOUT ST (SAFETY) ×2 IMPLANT
GLOVE BIOGEL PI IND STRL 7.0 (GLOVE) ×2 IMPLANT
GLOVE BIOGEL PI INDICATOR 7.0 (GLOVE) ×2
INST SET CATARACT ~~LOC~~ (KITS) ×2 IMPLANT
LENS ALC ACRYL/TECN (Ophthalmic Related) ×2 IMPLANT
PAD ARMBOARD 7.5X6 YLW CONV (MISCELLANEOUS) ×2 IMPLANT
WATER STERILE IRR 250ML POUR (IV SOLUTION) ×2 IMPLANT

## 2015-11-13 NOTE — Anesthesia Procedure Notes (Signed)
Procedure Name: MAC Date/Time: 11/13/2015 7:46 AM Performed by: Andree Elk, AMY A Pre-anesthesia Checklist: Patient identified, Timeout performed, Emergency Drugs available, Suction available and Patient being monitored Oxygen Delivery Method: Nasal cannula

## 2015-11-13 NOTE — Op Note (Signed)
11/13/2015  8:48 AM  PATIENT:  Brett [name]  82 y.o. male  PRE-OPERATIVE DIAGNOSIS:  cataract left eye, difficulty performing daily activities  POST-OPERATIVE DIAGNOSIS:  cataract left eye, difficulty performing daily activities  PROCEDURE:  Procedure(s): CATARACT EXTRACTION PHACO AND INTRAOCULAR LENS PLACEMENT LEFT EYE CDE=7.76  SURGEON:  Surgeon(s): Williams Che, MD  ASSISTANTS: Bonney Roussel, CST   ANESTHESIA STAFF: Anesthesiologist: Oleta Mouse, MD CRNA: Mickel Baas, CRNA  ANESTHESIA:   topical and MAC  REQUESTED LENS POWER: 24.0  LENS IMPLANT INFORMATION:  Alcon SN60 wf +24.0  Ser # [id number]  CUMULATIVE DISSIPATED ENERGY:7.76  INDICATIONS:see scanned office H&P for details  OP FINDINGS:dense 4+ NS  COMPLICATIONS:None  PROCEDURE:  The patient was brought to the operating room in good condition.  The operative eye was prepped and draped in the usual fashion for intraocular surgery.  Lidocaine gel was dropped onto the eye.  A 2.4 mm 10 O'clock near clear corneal stepped incision and a 12 O'clock stab incision were created.  Viscoat was instilled into the anterior chamber.  The 5 mm anterior capsulorhexis was performed with a bent needle cystotome and Utrata forceps.  The lens was hydrodissected and hydrodelineated with a cannula and balanced salt solution and rotated with a Kuglen hook.  Phacoemulsification was perfomed in the divide and conquer technique.  The remaining cortex was removed with I&A and the capsular surfaces polished as necessary.  Provisc was placed into the capsular bag and the lens inserted with the Alcon inserter.  The viscoelastic was removed with I&A and the lens "rocked" into position.  The wounds were hydrated and te anterior chamber was refilled with balanced salt solution.  The wounds were checked for leakage and rehydrated as necessary.  The lid speculum and drapes were removed and the patient was transported to short stay in good  condition.  PATIENT DISPOSITION:  Short Stay

## 2015-11-13 NOTE — Anesthesia Postprocedure Evaluation (Signed)
Anesthesia Post Note  Patient: Brett [name]  Procedure(s) Performed: Procedure(s) (LRB): CATARACT EXTRACTION PHACO AND INTRAOCULAR LENS PLACEMENT LEFT EYE CDE=7.76 (Left)  Patient location during evaluation: Short Stay Anesthesia Type: MAC Level of consciousness: awake and alert and oriented Pain management: pain level controlled Vital Signs Assessment: post-procedure vital signs reviewed and stable Respiratory status: spontaneous breathing Cardiovascular status: stable Postop Assessment: no signs of nausea or vomiting Anesthetic complications: no    Last Vitals:  Vitals:   11/13/15 0720 11/13/15 0725  BP: (!) 148/82 139/78  Resp: (!) 60 16  Temp:      Last Pain:  Vitals:   11/13/15 0649  TempSrc: Oral                 Brandonn Capelli A

## 2015-11-13 NOTE — H&P (Signed)
I have reviewed the pre printed H&P, the patient was re-examined, and I have identified no significant interval changes in the patient's medical condition.  There is no change in the plan of care since the history and physical of record. 

## 2015-11-13 NOTE — Brief Op Note (Signed)
11/13/2015  8:48 AM  PATIENT:  [place]  82 y.o. male  PRE-OPERATIVE DIAGNOSIS:  cataract left eye, difficulty performing daily activities  POST-OPERATIVE DIAGNOSIS:  cataract left eye, difficulty performing daily activities  PROCEDURE:  Procedure(s): CATARACT EXTRACTION PHACO AND INTRAOCULAR LENS PLACEMENT LEFT EYE CDE=7.76  SURGEON:  Surgeon(s): Williams Che, MD  ASSISTANTS: Bonney Roussel, CST   ANESTHESIA STAFF: Anesthesiologist: Oleta Mouse, MD CRNA: Mickel Baas, CRNA  ANESTHESIA:   topical and MAC  REQUESTED LENS POWER: 24.0  LENS IMPLANT INFORMATION:  Alcon SN60 wf +24.0  Ser # [id number]  CUMULATIVE DISSIPATED ENERGY:7.76  INDICATIONS:see scanned office H&P for details  OP FINDINGS:dense 4+ NS  COMPLICATIONS:None  DICTATION #: none  PLAN OF CARE: as above  PATIENT DISPOSITION:  Short Stay

## 2015-11-13 NOTE — Transfer of Care (Signed)
Immediate Anesthesia Transfer of Care Note  Patient: Brett [name]  Procedure(s) Performed: Procedure(s): CATARACT EXTRACTION PHACO AND INTRAOCULAR LENS PLACEMENT LEFT EYE CDE=7.76 (Left)  Patient Location: Short Stay  Anesthesia Type:MAC  Level of Consciousness: awake, alert , oriented and patient cooperative  Airway & Oxygen Therapy: Patient Spontanous Breathing and Patient connected to nasal cannula oxygen  Post-op Assessment: Report given to RN and Post -op Vital signs reviewed and stable  Post vital signs: Reviewed and stable  Last Vitals:  Vitals:   11/13/15 0720 11/13/15 0725  BP: (!) 148/82 139/78  Resp: (!) 60 16  Temp:      Last Pain:  Vitals:   11/13/15 0649  TempSrc: Oral      Patients Stated Pain Goal: 8 ([id number])  Complications: No apparent anesthesia complications

## 2015-11-13 NOTE — Anesthesia Preprocedure Evaluation (Signed)
Anesthesia Evaluation  Patient identified by MRN, date of birth, ID band Patient awake    Reviewed: Allergy & Precautions, NPO status , Patient's Chart, lab work & pertinent test results, reviewed documented beta blocker date and time   History of Anesthesia Complications Negative for: history of anesthetic complications  Airway Mallampati: II  TM Distance: >3 FB Neck ROM: Full    Dental  (+) Missing, Edentulous Upper   Pulmonary neg shortness of breath, neg COPD, neg recent URI, former smoker,    breath sounds clear to auscultation       Cardiovascular hypertension, Pt. on medications and Pt. on home beta blockers (-) angina(-) Past MI and (-) CHF + dysrhythmias Atrial Fibrillation  Rhythm:Irregular     Neuro/Psych neg Seizures negative psych ROS   GI/Hepatic negative GI ROS, Neg liver ROS,   Endo/Other  diabetes, Type 2, Oral Hypoglycemic AgentsHypothyroidism   Renal/GU Renal InsufficiencyRenal disease     Musculoskeletal   Abdominal   Peds  Hematology   Anesthesia Other Findings   Reproductive/Obstetrics                             Anesthesia Physical Anesthesia Plan  ASA: III  Anesthesia Plan: MAC   Post-op Pain Management:    Induction: Intravenous  Airway Management Planned: Nasal Cannula, Natural Airway and Simple Face Mask  Additional Equipment: None  Intra-op Plan:   Post-operative Plan:   Informed Consent: I have reviewed the patients History and Physical, chart, labs and discussed the procedure including the risks, benefits and alternatives for the proposed anesthesia with the patient or authorized representative who has indicated his/her understanding and acceptance.   Dental advisory given  Plan Discussed with: CRNA and Surgeon  Anesthesia Plan Comments:         Anesthesia Quick Evaluation

## 2015-11-13 NOTE — Addendum Note (Signed)
Addendum  created 11/13/15 [id number] by Oleta Mouse, MD   Anesthesia Attestations filed

## 2015-11-15 DIAGNOSIS — R69 Illness, unspecified: Secondary | ICD-10-CM | POA: Diagnosis not present

## 2015-11-17 ENCOUNTER — Encounter (HOSPITAL_COMMUNITY): Payer: Self-pay | Admitting: Ophthalmology

## 2015-11-23 DIAGNOSIS — L570 Actinic keratosis: Secondary | ICD-10-CM | POA: Diagnosis not present

## 2015-11-23 DIAGNOSIS — X32XXXD Exposure to sunlight, subsequent encounter: Secondary | ICD-10-CM | POA: Diagnosis not present

## 2015-11-23 DIAGNOSIS — S50311A Abrasion of right elbow, initial encounter: Secondary | ICD-10-CM | POA: Diagnosis not present

## 2015-11-23 DIAGNOSIS — Z1283 Encounter for screening for malignant neoplasm of skin: Secondary | ICD-10-CM | POA: Diagnosis not present

## 2015-11-23 DIAGNOSIS — L821 Other seborrheic keratosis: Secondary | ICD-10-CM | POA: Diagnosis not present

## 2015-11-23 DIAGNOSIS — Z08 Encounter for follow-up examination after completed treatment for malignant neoplasm: Secondary | ICD-10-CM | POA: Diagnosis not present

## 2015-11-23 DIAGNOSIS — Z85820 Personal history of malignant melanoma of skin: Secondary | ICD-10-CM | POA: Diagnosis not present

## 2015-11-30 DIAGNOSIS — Z5181 Encounter for therapeutic drug level monitoring: Secondary | ICD-10-CM | POA: Diagnosis not present

## 2015-11-30 DIAGNOSIS — Z79899 Other long term (current) drug therapy: Secondary | ICD-10-CM | POA: Diagnosis not present

## 2015-12-14 DIAGNOSIS — Z0000 Encounter for general adult medical examination without abnormal findings: Secondary | ICD-10-CM | POA: Diagnosis not present

## 2015-12-14 DIAGNOSIS — E7801 Familial hypercholesterolemia: Secondary | ICD-10-CM | POA: Diagnosis not present

## 2015-12-14 DIAGNOSIS — E1165 Type 2 diabetes mellitus with hyperglycemia: Secondary | ICD-10-CM | POA: Diagnosis not present

## 2015-12-14 DIAGNOSIS — Z1211 Encounter for screening for malignant neoplasm of colon: Secondary | ICD-10-CM | POA: Diagnosis not present

## 2015-12-14 DIAGNOSIS — I10 Essential (primary) hypertension: Secondary | ICD-10-CM | POA: Diagnosis not present

## 2015-12-14 DIAGNOSIS — E039 Hypothyroidism, unspecified: Secondary | ICD-10-CM | POA: Diagnosis not present

## 2015-12-21 DIAGNOSIS — H353211 Exudative age-related macular degeneration, right eye, with active choroidal neovascularization: Secondary | ICD-10-CM | POA: Diagnosis not present

## 2015-12-21 DIAGNOSIS — H353221 Exudative age-related macular degeneration, left eye, with active choroidal neovascularization: Secondary | ICD-10-CM | POA: Diagnosis not present

## 2016-01-04 DIAGNOSIS — E785 Hyperlipidemia, unspecified: Secondary | ICD-10-CM | POA: Diagnosis not present

## 2016-01-04 DIAGNOSIS — Z0000 Encounter for general adult medical examination without abnormal findings: Secondary | ICD-10-CM | POA: Diagnosis not present

## 2016-01-04 DIAGNOSIS — I482 Chronic atrial fibrillation: Secondary | ICD-10-CM | POA: Diagnosis not present

## 2016-01-04 DIAGNOSIS — I10 Essential (primary) hypertension: Secondary | ICD-10-CM | POA: Diagnosis not present

## 2016-01-04 DIAGNOSIS — E039 Hypothyroidism, unspecified: Secondary | ICD-10-CM | POA: Diagnosis not present

## 2016-01-04 DIAGNOSIS — E119 Type 2 diabetes mellitus without complications: Secondary | ICD-10-CM | POA: Diagnosis not present

## 2016-01-16 DIAGNOSIS — Z5181 Encounter for therapeutic drug level monitoring: Secondary | ICD-10-CM | POA: Diagnosis not present

## 2016-02-13 DIAGNOSIS — Z79899 Other long term (current) drug therapy: Secondary | ICD-10-CM | POA: Diagnosis not present

## 2016-02-13 DIAGNOSIS — Z5181 Encounter for therapeutic drug level monitoring: Secondary | ICD-10-CM | POA: Diagnosis not present

## 2016-02-15 ENCOUNTER — Other Ambulatory Visit: Payer: Self-pay | Admitting: Cardiovascular Disease

## 2016-02-15 MED ORDER — DILTIAZEM HCL ER COATED BEADS 120 MG PO CP24: 120.0000 mg | ORAL_CAPSULE | Freq: Every day | ORAL | 3 refills | Status: DC

## 2016-02-15 NOTE — Telephone Encounter (Signed)
Medication sent to pharmacy  

## 2016-02-15 NOTE — Telephone Encounter (Signed)
diltiazem (CARTIA XT) 120 MG 24 hr capsule refills needed [facility]

## 2016-02-23 DIAGNOSIS — R69 Illness, unspecified: Secondary | ICD-10-CM | POA: Diagnosis not present

## 2016-03-14 DIAGNOSIS — H353221 Exudative age-related macular degeneration, left eye, with active choroidal neovascularization: Secondary | ICD-10-CM | POA: Diagnosis not present

## 2016-03-14 DIAGNOSIS — H353211 Exudative age-related macular degeneration, right eye, with active choroidal neovascularization: Secondary | ICD-10-CM | POA: Diagnosis not present

## 2016-03-15 DIAGNOSIS — Z79899 Other long term (current) drug therapy: Secondary | ICD-10-CM | POA: Diagnosis not present

## 2016-03-15 DIAGNOSIS — Z5181 Encounter for therapeutic drug level monitoring: Secondary | ICD-10-CM | POA: Diagnosis not present

## 2016-05-06 DIAGNOSIS — Z79899 Other long term (current) drug therapy: Secondary | ICD-10-CM | POA: Diagnosis not present

## 2016-05-06 DIAGNOSIS — E1165 Type 2 diabetes mellitus with hyperglycemia: Secondary | ICD-10-CM | POA: Diagnosis not present

## 2016-05-06 DIAGNOSIS — Z5181 Encounter for therapeutic drug level monitoring: Secondary | ICD-10-CM | POA: Diagnosis not present

## 2016-05-06 DIAGNOSIS — R69 Illness, unspecified: Secondary | ICD-10-CM | POA: Diagnosis not present

## 2016-05-27 DIAGNOSIS — Z08 Encounter for follow-up examination after completed treatment for malignant neoplasm: Secondary | ICD-10-CM | POA: Diagnosis not present

## 2016-05-27 DIAGNOSIS — Z1283 Encounter for screening for malignant neoplasm of skin: Secondary | ICD-10-CM | POA: Diagnosis not present

## 2016-05-27 DIAGNOSIS — L570 Actinic keratosis: Secondary | ICD-10-CM | POA: Diagnosis not present

## 2016-05-27 DIAGNOSIS — Z85820 Personal history of malignant melanoma of skin: Secondary | ICD-10-CM | POA: Diagnosis not present

## 2016-05-27 DIAGNOSIS — X32XXXD Exposure to sunlight, subsequent encounter: Secondary | ICD-10-CM | POA: Diagnosis not present

## 2016-06-06 DIAGNOSIS — Z87891 Personal history of nicotine dependence: Secondary | ICD-10-CM | POA: Diagnosis not present

## 2016-06-06 DIAGNOSIS — E785 Hyperlipidemia, unspecified: Secondary | ICD-10-CM | POA: Diagnosis not present

## 2016-06-06 DIAGNOSIS — I10 Essential (primary) hypertension: Secondary | ICD-10-CM | POA: Diagnosis not present

## 2016-06-06 DIAGNOSIS — Z0000 Encounter for general adult medical examination without abnormal findings: Secondary | ICD-10-CM | POA: Diagnosis not present

## 2016-06-06 DIAGNOSIS — I482 Chronic atrial fibrillation: Secondary | ICD-10-CM | POA: Diagnosis not present

## 2016-06-06 DIAGNOSIS — Z6829 Body mass index (BMI) 29.0-29.9, adult: Secondary | ICD-10-CM | POA: Diagnosis not present

## 2016-06-06 DIAGNOSIS — Z7901 Long term (current) use of anticoagulants: Secondary | ICD-10-CM | POA: Diagnosis not present

## 2016-06-06 DIAGNOSIS — E119 Type 2 diabetes mellitus without complications: Secondary | ICD-10-CM | POA: Diagnosis not present

## 2016-06-06 DIAGNOSIS — G4700 Insomnia, unspecified: Secondary | ICD-10-CM | POA: Diagnosis not present

## 2016-06-06 DIAGNOSIS — H359 Unspecified retinal disorder: Secondary | ICD-10-CM | POA: Diagnosis not present

## 2016-06-06 DIAGNOSIS — E039 Hypothyroidism, unspecified: Secondary | ICD-10-CM | POA: Diagnosis not present

## 2016-06-10 DIAGNOSIS — Z5181 Encounter for therapeutic drug level monitoring: Secondary | ICD-10-CM | POA: Diagnosis not present

## 2016-06-10 DIAGNOSIS — Z79899 Other long term (current) drug therapy: Secondary | ICD-10-CM | POA: Diagnosis not present

## 2016-06-17 DIAGNOSIS — H353211 Exudative age-related macular degeneration, right eye, with active choroidal neovascularization: Secondary | ICD-10-CM | POA: Diagnosis not present

## 2016-06-17 DIAGNOSIS — H353221 Exudative age-related macular degeneration, left eye, with active choroidal neovascularization: Secondary | ICD-10-CM | POA: Diagnosis not present

## 2016-07-23 DIAGNOSIS — Z5181 Encounter for therapeutic drug level monitoring: Secondary | ICD-10-CM | POA: Diagnosis not present

## 2016-07-23 DIAGNOSIS — Z79899 Other long term (current) drug therapy: Secondary | ICD-10-CM | POA: Diagnosis not present

## 2016-08-07 DIAGNOSIS — J22 Unspecified acute lower respiratory infection: Secondary | ICD-10-CM | POA: Diagnosis not present

## 2016-08-12 DIAGNOSIS — Z79899 Other long term (current) drug therapy: Secondary | ICD-10-CM | POA: Diagnosis not present

## 2016-08-12 DIAGNOSIS — Z5181 Encounter for therapeutic drug level monitoring: Secondary | ICD-10-CM | POA: Diagnosis not present

## 2016-08-22 ENCOUNTER — Encounter: Payer: Self-pay | Admitting: Cardiovascular Disease

## 2016-08-22 ENCOUNTER — Ambulatory Visit (INDEPENDENT_AMBULATORY_CARE_PROVIDER_SITE_OTHER): Payer: Medicare HMO | Admitting: Cardiovascular Disease

## 2016-08-22 VITALS — BP 112/70 | HR 70 | Ht 72.0 in | Wt 219.0 lb

## 2016-08-22 DIAGNOSIS — E119 Type 2 diabetes mellitus without complications: Secondary | ICD-10-CM | POA: Diagnosis not present

## 2016-08-22 DIAGNOSIS — R05 Cough: Secondary | ICD-10-CM

## 2016-08-22 DIAGNOSIS — R0989 Other specified symptoms and signs involving the circulatory and respiratory systems: Secondary | ICD-10-CM | POA: Diagnosis not present

## 2016-08-22 DIAGNOSIS — I700 Atherosclerosis of aorta: Secondary | ICD-10-CM | POA: Diagnosis not present

## 2016-08-22 DIAGNOSIS — I2510 Atherosclerotic heart disease of native coronary artery without angina pectoris: Secondary | ICD-10-CM

## 2016-08-22 DIAGNOSIS — I4821 Permanent atrial fibrillation: Secondary | ICD-10-CM

## 2016-08-22 DIAGNOSIS — I10 Essential (primary) hypertension: Secondary | ICD-10-CM | POA: Diagnosis not present

## 2016-08-22 DIAGNOSIS — I272 Pulmonary hypertension, unspecified: Secondary | ICD-10-CM

## 2016-08-22 DIAGNOSIS — R059 Cough, unspecified: Secondary | ICD-10-CM

## 2016-08-22 DIAGNOSIS — I5032 Chronic diastolic (congestive) heart failure: Secondary | ICD-10-CM

## 2016-08-22 DIAGNOSIS — I482 Chronic atrial fibrillation: Secondary | ICD-10-CM | POA: Diagnosis not present

## 2016-08-22 DIAGNOSIS — E782 Mixed hyperlipidemia: Secondary | ICD-10-CM

## 2016-08-22 NOTE — Progress Notes (Signed)
SUBJECTIVE: The patient presents for follow-up of coronary artery disease, chronic diastolic heart failure, and atrial fibrillation. He underwent PCI of large 1st diagonal on 08/25/2007 with balloon angioplasty (no stent placed). Had 30-40% mid RCA disease and 20-30% mid LAD disease. LCx with only luminal irregularities, performed by Dr. Chad Cordial in [place], [place].  He denies chest pain, shortness of breath, palpitations, and recent hospitalizations. He also denies leg swelling, orthopnea, and paroxysmal nocturnal dyspnea.  He has had a productive cough with green sputum recently. He went to [facility] and was prescribed azithromycin. Symptoms started improving but have increased in the past 2 days.     Review of Systems: As per "subjective", otherwise negative.  No Known Allergies  Current Outpatient Prescriptions  Medication Sig Dispense Refill  . aspirin EC 81 MG tablet Take 1 tablet (81 mg total) by mouth daily.    Marland Kitchen diltiazem (CARTIA XT) 120 MG 24 hr capsule Take 1 capsule (120 mg total) by mouth daily. 90 capsule 3  . diphenhydrAMINE (BENADRYL) 25 MG tablet Take 25 mg by mouth every 6 (six) hours as needed. For sleep    . doxazosin (CARDURA) 2 MG tablet Take 1 tablet (2 mg total) by mouth at bedtime. 90 tablet 3  . levothyroxine (SYNTHROID, LEVOTHROID) 50 MCG tablet Take 50 mcg by mouth daily.    Marland Kitchen lisinopril-hydrochlorothiazide (PRINZIDE,ZESTORETIC) 20-12.5 MG tablet Take 1 tablet by mouth daily.    . metFORMIN (GLUCOPHAGE) 500 MG tablet Take 1,000 mg by mouth at bedtime.    . metoprolol succinate (TOPROL-XL) 50 MG 24 hr tablet Take 1 tablet (50 mg total) by mouth daily. Take with or immediately following a meal. 90 tablet 3  . pravastatin (PRAVACHOL) 40 MG tablet Take 80 mg by mouth daily.    Marland Kitchen warfarin (COUMADIN) 3 MG tablet Take 3 mg by mouth daily.     No current facility-administered medications for this visit.     Past Medical History:  Diagnosis Date  .  Atrial fibrillation ([place])    On coumadin  . Chronic kidney disease (CKD), stage III (moderate)   . Dysrhythmia    AFib  . Essential hypertension, benign   . History of cardiac catheterization    Details not certain - reportedly no interventions  . Hypothyroidism   . Macular degeneration   . Pulmonary hypertension ([place]) 12/03/2013   PA pressure 56. Ejection fraction 55-60%  . Type 2 diabetes mellitus ([place])     Past Surgical History:  Procedure Laterality Date  . CARDIOVERSION N/A 12/10/2013   Procedure: CARDIOVERSION;  Surgeon: Herminio Commons, MD;  Location: [facility];  Service: Endoscopy;  Laterality: N/A;  . CATARACT EXTRACTION W/PHACO Left 11/13/2015   Procedure: CATARACT EXTRACTION PHACO AND INTRAOCULAR LENS PLACEMENT LEFT EYE CDE=7.76;  Surgeon: Williams Che, MD;  Location: [facility];  Service: Ophthalmology;  Laterality: Left;  . CHOLECYSTECTOMY    . COLONOSCOPY     Remote > 10 years ago  . REPLACEMENT TOTAL HIP W/  RESURFACING IMPLANTS Bilateral   . TEE WITHOUT CARDIOVERSION N/A 12/10/2013   Procedure: TRANSESOPHAGEAL ECHOCARDIOGRAM (TEE);  Surgeon: Herminio Commons, MD;  Location: [facility];  Service: Endoscopy;  Laterality: N/A;    Social History   Social History  . Marital status: Divorced    Spouse name: N/A  . Number of children: N/A  . Years of education: N/A   Occupational History  . Not on file.   Social History Main Topics  .  Smoking status: Former Smoker    Packs/day: 1.00    Years: 37.00    Types: Cigarettes    Start date: 11/01/1952    Quit date: 08/18/1978  . Smokeless tobacco: Never Used  . Alcohol use 0.0 oz/week     Comment: Occasional  . Drug use: No  . Sexual activity: Not Currently   Other Topics Concern  . Not on file   Social History Narrative  . No narrative on file     Vitals:   08/22/16 1056  BP: 112/70  Pulse: 70  SpO2: 96%  Weight: 219 lb (99.3 kg)  Height: 6' (1.829 m)    Wt Readings from Last 3 Encounters:  08/22/16  219 lb (99.3 kg)  11/07/15 221 lb (100.2 kg)  08/15/15 220 lb (99.8 kg)     PHYSICAL EXAM General: NAD HEENT: Normal. Neck: No JVD, no thyromegaly. Lungs: Diminished breath sounds at right base with crackles. CV: Nondisplaced PMI.  Regular rate and irregular rhythm, normal S1/S2, no S3, no murmur. No pretibial or periankle edema.  No carotid bruit.   Abdomen: Soft, nontender, no distention.  Neurologic: Alert and oriented.  Psych: Normal affect. Skin: Normal. Musculoskeletal: No gross deformities.    ECG: Most recent ECG reviewed.   Labs: Lab Results  Component Value Date/Time   K 3.8 11/07/2015 01:00 PM   BUN 24 (H) 11/07/2015 01:00 PM   CREATININE 1.22 11/07/2015 01:00 PM   ALT 115 (H) 12/06/2013 04:50 AM   TSH 3.130 12/01/2013 08:47 PM   HGB 13.5 11/07/2015 01:00 PM     Lipids: No results found for: LDLCALC, LDLDIRECT, CHOL, TRIG, HDL     ASSESSMENT AND PLAN: 1. Permanent atrial fibrillation: Symptomatically stable on long-acting diltiazem and extended release metoprolol. Anticoagulant with warfarin. I discussed newer direct oral anticoagulants with him but he prefers to stay on warfarin.  2. Chronic diastolic heart failure: Euvolemic. No changes to therapy. This occurred in the setting of rapid atrial fibrillation.  3. CAD: Stable ischemic heart disease. Symptoms medically stable. Continue aspirin, metoprolol succinate, and pravastatin.  4. Pulmonary HTN: This appears to be stable.  5. Hyperlipidemia:  Continue pravastatin 80 mg.  6. Essential HTN: Continue pravastatin.  7. Productive cough with right base diminished sounds and crackles: He may have developed pneumonia. I will obtain a PA and lateral chest x-ray. He has already been prescribed azithromycin.    Disposition: Follow up 1 year.  Kate Sable, M.D., F.A.C.C.

## 2016-08-22 NOTE — Patient Instructions (Signed)
Medication Instructions:  Continue all current medications.  Labwork: none  Testing/Procedures:  Chest x-ray - order given today.  Office will contact with results via phone or letter.    Follow-Up: Your physician wants you to follow up in:  1 year.  You will receive a reminder letter in the mail one-two months in advance.  If you don't receive a letter, please call our office to schedule the follow up appointment   Any Other Special Instructions Will Be Listed Below (If Applicable).  If you need a refill on your cardiac medications before your next appointment, please call your pharmacy.

## 2016-08-29 ENCOUNTER — Encounter: Payer: Self-pay | Admitting: *Deleted

## 2016-08-29 ENCOUNTER — Telehealth: Payer: Self-pay | Admitting: Cardiovascular Disease

## 2016-08-29 NOTE — Telephone Encounter (Signed)
Patient asking for results on Xray

## 2016-08-29 NOTE — Telephone Encounter (Signed)
No answer.  Did not see results in chart.  Requested from [facility].

## 2016-08-30 NOTE — Telephone Encounter (Signed)
Notes recorded by Laurine Blazer, LPN on 7/61/9509 at 3:26 PM EDT Patient notified. Copy to pmd. ------  Notes recorded by Herminio Commons, MD on 08/29/2016 at 12:07 PM EDT No acute findings (no CHF, no new infections).

## 2016-09-12 DIAGNOSIS — H2511 Age-related nuclear cataract, right eye: Secondary | ICD-10-CM | POA: Diagnosis not present

## 2016-09-12 DIAGNOSIS — H353231 Exudative age-related macular degeneration, bilateral, with active choroidal neovascularization: Secondary | ICD-10-CM | POA: Diagnosis not present

## 2016-09-13 DIAGNOSIS — Z79899 Other long term (current) drug therapy: Secondary | ICD-10-CM | POA: Diagnosis not present

## 2016-09-13 DIAGNOSIS — Z5181 Encounter for therapeutic drug level monitoring: Secondary | ICD-10-CM | POA: Diagnosis not present

## 2016-09-27 DIAGNOSIS — Z79899 Other long term (current) drug therapy: Secondary | ICD-10-CM | POA: Diagnosis not present

## 2016-09-27 DIAGNOSIS — Z5181 Encounter for therapeutic drug level monitoring: Secondary | ICD-10-CM | POA: Diagnosis not present

## 2016-10-14 ENCOUNTER — Encounter (HOSPITAL_COMMUNITY): Payer: Self-pay | Admitting: Emergency Medicine

## 2016-10-14 ENCOUNTER — Inpatient Hospital Stay (HOSPITAL_COMMUNITY)
Admission: EM | Admit: 2016-10-14 | Discharge: 2016-10-18 | DRG: 445 | Disposition: A | Discharge status: Home / Self Care | Payer: Medicare HMO | Attending: Internal Medicine | Admitting: Internal Medicine

## 2016-10-14 DIAGNOSIS — E871 Hypo-osmolality and hyponatremia: Secondary | ICD-10-CM | POA: Diagnosis not present

## 2016-10-14 DIAGNOSIS — K8050 Calculus of bile duct without cholangitis or cholecystitis without obstruction: Secondary | ICD-10-CM

## 2016-10-14 DIAGNOSIS — R17 Unspecified jaundice: Secondary | ICD-10-CM

## 2016-10-14 DIAGNOSIS — E1121 Type 2 diabetes mellitus with diabetic nephropathy: Secondary | ICD-10-CM | POA: Diagnosis not present

## 2016-10-14 DIAGNOSIS — Z9049 Acquired absence of other specified parts of digestive tract: Secondary | ICD-10-CM

## 2016-10-14 DIAGNOSIS — Z87891 Personal history of nicotine dependence: Secondary | ICD-10-CM

## 2016-10-14 DIAGNOSIS — K8051 Calculus of bile duct without cholangitis or cholecystitis with obstruction: Principal | ICD-10-CM | POA: Diagnosis present

## 2016-10-14 DIAGNOSIS — Z7982 Long term (current) use of aspirin: Secondary | ICD-10-CM

## 2016-10-14 DIAGNOSIS — R740 Nonspecific elevation of levels of transaminase and lactic acid dehydrogenase [LDH]: Secondary | ICD-10-CM | POA: Diagnosis not present

## 2016-10-14 DIAGNOSIS — R7401 Elevation of levels of liver transaminase levels: Secondary | ICD-10-CM | POA: Diagnosis present

## 2016-10-14 DIAGNOSIS — J449 Chronic obstructive pulmonary disease, unspecified: Secondary | ICD-10-CM | POA: Diagnosis present

## 2016-10-14 DIAGNOSIS — E039 Hypothyroidism, unspecified: Secondary | ICD-10-CM | POA: Diagnosis present

## 2016-10-14 DIAGNOSIS — I272 Pulmonary hypertension, unspecified: Secondary | ICD-10-CM | POA: Diagnosis present

## 2016-10-14 DIAGNOSIS — D696 Thrombocytopenia, unspecified: Secondary | ICD-10-CM | POA: Diagnosis not present

## 2016-10-14 DIAGNOSIS — R1033 Periumbilical pain: Secondary | ICD-10-CM

## 2016-10-14 DIAGNOSIS — E222 Syndrome of inappropriate secretion of antidiuretic hormone: Secondary | ICD-10-CM | POA: Diagnosis present

## 2016-10-14 DIAGNOSIS — R748 Abnormal levels of other serum enzymes: Secondary | ICD-10-CM | POA: Diagnosis not present

## 2016-10-14 DIAGNOSIS — Z96643 Presence of artificial hip joint, bilateral: Secondary | ICD-10-CM | POA: Diagnosis present

## 2016-10-14 DIAGNOSIS — R945 Abnormal results of liver function studies: Secondary | ICD-10-CM | POA: Diagnosis not present

## 2016-10-14 DIAGNOSIS — H3530 Unspecified macular degeneration: Secondary | ICD-10-CM | POA: Diagnosis present

## 2016-10-14 DIAGNOSIS — I5032 Chronic diastolic (congestive) heart failure: Secondary | ICD-10-CM | POA: Diagnosis present

## 2016-10-14 DIAGNOSIS — R791 Abnormal coagulation profile: Secondary | ICD-10-CM | POA: Diagnosis present

## 2016-10-14 DIAGNOSIS — R1013 Epigastric pain: Secondary | ICD-10-CM | POA: Diagnosis not present

## 2016-10-14 DIAGNOSIS — N183 Chronic kidney disease, stage 3 unspecified: Secondary | ICD-10-CM | POA: Diagnosis present

## 2016-10-14 DIAGNOSIS — Z794 Long term (current) use of insulin: Secondary | ICD-10-CM | POA: Diagnosis not present

## 2016-10-14 DIAGNOSIS — I130 Hypertensive heart and chronic kidney disease with heart failure and stage 1 through stage 4 chronic kidney disease, or unspecified chronic kidney disease: Secondary | ICD-10-CM | POA: Diagnosis present

## 2016-10-14 DIAGNOSIS — I482 Chronic atrial fibrillation: Secondary | ICD-10-CM | POA: Diagnosis not present

## 2016-10-14 DIAGNOSIS — R109 Unspecified abdominal pain: Secondary | ICD-10-CM

## 2016-10-14 DIAGNOSIS — E1122 Type 2 diabetes mellitus with diabetic chronic kidney disease: Secondary | ICD-10-CM | POA: Diagnosis present

## 2016-10-14 DIAGNOSIS — I481 Persistent atrial fibrillation: Secondary | ICD-10-CM | POA: Diagnosis present

## 2016-10-14 DIAGNOSIS — Z7984 Long term (current) use of oral hypoglycemic drugs: Secondary | ICD-10-CM

## 2016-10-14 DIAGNOSIS — I480 Paroxysmal atrial fibrillation: Secondary | ICD-10-CM | POA: Diagnosis present

## 2016-10-14 DIAGNOSIS — D689 Coagulation defect, unspecified: Secondary | ICD-10-CM | POA: Diagnosis not present

## 2016-10-14 DIAGNOSIS — K5750 Diverticulosis of both small and large intestine without perforation or abscess without bleeding: Secondary | ICD-10-CM | POA: Diagnosis present

## 2016-10-14 DIAGNOSIS — Z7901 Long term (current) use of anticoagulants: Secondary | ICD-10-CM

## 2016-10-14 DIAGNOSIS — Z8249 Family history of ischemic heart disease and other diseases of the circulatory system: Secondary | ICD-10-CM

## 2016-10-14 MED ORDER — ONDANSETRON HCL 4 MG/2ML IJ SOLN
4.0000 mg | Freq: Once | INTRAMUSCULAR | Status: AC
  Administered 2016-10-15: 4 mg via INTRAVENOUS
  Filled 2016-10-14: qty 2

## 2016-10-14 MED ORDER — SODIUM CHLORIDE 0.9 % IV BOLUS (SEPSIS)
500.0000 mL | Freq: Once | INTRAVENOUS | Status: AC
  Administered 2016-10-15: 500 mL via INTRAVENOUS

## 2016-10-14 MED ORDER — MORPHINE SULFATE (PF) 4 MG/ML IV SOLN
4.0000 mg | Freq: Once | INTRAVENOUS | Status: AC
  Administered 2016-10-15: 4 mg via INTRAVENOUS
  Filled 2016-10-14: qty 1

## 2016-10-14 NOTE — ED Provider Notes (Signed)
[facility] DEPT Provider Note   CSN: [id number] Arrival date & time: 10/14/16  2303     History   Chief Complaint Chief Complaint  Patient presents with  . Abdominal Pain    HPI Brett [name] is a 83 y.o. male.  The history is provided by the patient.  Abdominal Pain    He had onset yesterday of periumbilical pain which is getting progressively worse. Pain is severe and he rates it at 9/10. Nothing makes it better, nothing makes it worse. There is no radiation of pain. He has had nausea but no vomiting. He has not had bowel movement or passed flatus. He denies fever or chills but has had sweats. He denies any urinary difficulty. He is status post cholecystectomy, no other abdominal surgeries.  Past Medical History:  Diagnosis Date  . Atrial fibrillation ([place])    On coumadin  . Chronic kidney disease (CKD), stage III (moderate)   . Dysrhythmia    AFib  . Essential hypertension, benign   . History of cardiac catheterization    Details not certain - reportedly no interventions  . Hypothyroidism   . Macular degeneration   . Pulmonary hypertension ([place]) 12/03/2013   PA pressure 56. Ejection fraction 55-60%  . Type 2 diabetes mellitus [facility])     Patient Active Problem List   Diagnosis Date Noted  . A-fib ([place]) 02/23/2014  . Diabetes ([place]) 02/23/2014  . BP (high blood pressure) 02/23/2014  . HLD (hyperlipidemia) 02/23/2014  . Excess weight 02/23/2014  . Chronic diastolic heart failure ([place]) 12/24/2013  . Acute bronchitis 12/06/2013  . DIC (disseminated intravascular coagulation) ([place]) 12/04/2013  . Thrombocytopenia, unspecified ([place]) 12/03/2013  . Pulmonary hypertension ([place]) 12/03/2013  . Wheezing 12/02/2013  . Obesity, morbid ([place]) 12/02/2013  . Transaminitis 12/02/2013  . Type 2 diabetes mellitus with diabetic nephropathy ([place]) 12/01/2013  . Hypertension 12/01/2013  . Atrial fibrillation with RVR ([place]) 12/01/2013  . Choledocholithiasis 12/01/2013  .  Pancreatitis due to biliary obstruction 12/01/2013  . Chronic kidney disease (CKD), stage III (moderate) 12/01/2013  . Hypothyroidism 12/01/2013  . Cholelithiasis 12/01/2013  . History of other specified conditions presenting hazards to health 02/09/2008  . Lumbar back sprain 02/09/2008  . Herpes zoster keratoconjunctivitis 09/15/2007  . Acute systolic heart failure ([place]) 08/20/2007  . PNA (pneumonia) 08/20/2007  . Fast heart beat 08/20/2007  . Feeling bilious 08/16/2007    Past Surgical History:  Procedure Laterality Date  . CARDIOVERSION N/A 12/10/2013   Procedure: CARDIOVERSION;  Surgeon: Herminio Commons, MD;  Location: [facility];  Service: Endoscopy;  Laterality: N/A;  . CATARACT EXTRACTION W/PHACO Left 11/13/2015   Procedure: CATARACT EXTRACTION PHACO AND INTRAOCULAR LENS PLACEMENT LEFT EYE CDE=7.76;  Surgeon: Williams Che, MD;  Location: [facility] ORS;  Service: Ophthalmology;  Laterality: Left;  . CHOLECYSTECTOMY    . COLONOSCOPY     Remote > 10 years ago  . REPLACEMENT TOTAL HIP W/  RESURFACING IMPLANTS Bilateral   . TEE WITHOUT CARDIOVERSION N/A 12/10/2013   Procedure: TRANSESOPHAGEAL ECHOCARDIOGRAM (TEE);  Surgeon: Herminio Commons, MD;  Location: [facility];  Service: Endoscopy;  Laterality: N/A;       Home Medications    Prior to Admission medications   Medication Sig Start Date End Date Taking? Authorizing Provider  aspirin EC 81 MG tablet Take 1 tablet (81 mg total) by mouth daily. 02/15/15   Herminio Commons, MD  diltiazem (CARTIA XT) 120 MG 24 hr capsule Take 1 capsule (120 mg total)  by mouth daily. 02/15/16   Herminio Commons, MD  diphenhydrAMINE (BENADRYL) 25 MG tablet Take 25 mg by mouth every 6 (six) hours as needed. For sleep    [provider]  doxazosin (CARDURA) 2 MG tablet Take 1 tablet (2 mg total) by mouth at bedtime. 04/24/15   Herminio Commons, MD  levothyroxine (SYNTHROID, LEVOTHROID) 50 MCG tablet Take 50 mcg by mouth daily.    [provider]  lisinopril-hydrochlorothiazide (PRINZIDE,ZESTORETIC) 20-12.5 MG tablet Take 1 tablet by mouth daily.    [provider]  metFORMIN (GLUCOPHAGE) 500 MG tablet Take 1,000 mg by mouth at bedtime.    [provider]  metoprolol succinate (TOPROL-XL) 50 MG 24 hr tablet Take 1 tablet (50 mg total) by mouth daily. Take with or immediately following a meal. 04/24/15   Herminio Commons, MD  pravastatin (PRAVACHOL) 40 MG tablet Take 80 mg by mouth daily.    [provider]  warfarin (COUMADIN) 3 MG tablet Take 3 mg by mouth daily.    [provider]    Family History Family History  Problem Relation Age of Onset  . Hypertension Mother   . Hypertension Father   . Colon cancer Neg Hx     Social History Social History  Substance Use Topics  . Smoking status: Former Smoker    Packs/day: 1.00    Years: 37.00    Types: Cigarettes    Start date: 11/01/1952    Quit date: 08/18/1978  . Smokeless tobacco: Never Used  . Alcohol use 0.0 oz/week     Comment: Occasional     Allergies   Patient has no known allergies.   Review of Systems Review of Systems  Gastrointestinal: Positive for abdominal pain.  All other systems reviewed and are negative.    Physical Exam Updated Vital Signs BP 121/82   Pulse (!) 111   Temp 97.7 F (36.5 C) (Oral)   Resp 18   Ht 6' (1.829 m)   Wt 97.5 kg (215 lb)   SpO2 95%   BMI 29.16 kg/m   Physical Exam  Nursing note and vitals reviewed.  83 year old male, resting comfortably and in no acute distress. Vital signs are significant for tachycardia. Oxygen saturation is 95%, which is normal. Head is normocephalic and atraumatic. PERRLA, EOMI. Oropharynx is clear. Neck is nontender and supple without adenopathy or JVD. Back is nontender and there is no CVA tenderness. Lungs are clear without rales, wheezes, or rhonchi. Chest is nontender. Heart has regular rate and rhythm without murmur. Abdomen is  distended, soft with some moderate periumbilical tenderness. There is no rebound or guarding. There are no masses or hepatosplenomegaly and peristalsis is hypoactive. Extremities have no cyanosis or edema, full range of motion is present. Skin is warm and dry without rash. Neurologic: Mental status is normal, cranial nerves are intact, there are no motor or sensory deficits.  ED Treatments / Results  Labs (all labs ordered are listed, but only abnormal results are displayed) Labs Reviewed  COMPREHENSIVE METABOLIC PANEL - Abnormal; Notable for the following:       Result Value   Sodium 131 (*)    Chloride 92 (*)    Glucose, Bld 189 (*)    AST 410 (*)    ALT 449 (*)    Alkaline Phosphatase 242 (*)    Total Bilirubin 5.1 (*)    GFR calc non Af Amer 54 (*)    All other components within  normal limits  LIPASE, BLOOD - Abnormal; Notable for the following:    Lipase 57 (*)    All other components within normal limits  CBC WITH DIFFERENTIAL/PLATELET - Abnormal; Notable for the following:    WBC 13.1 (*)    Neutro Abs 11.2 (*)    Lymphs Abs 0.5 (*)    Monocytes Absolute 1.4 (*)    All other components within normal limits  URINALYSIS, ROUTINE W REFLEX MICROSCOPIC - Abnormal; Notable for the following:    Color, Urine AMBER (*)    Specific Gravity, Urine >1.046 (*)    Glucose, UA 50 (*)    Hgb urine dipstick MODERATE (*)    Ketones, ur 5 (*)    All other components within normal limits   Radiology Ct Abdomen Pelvis W Contrast  Result Date: 10/15/2016 CLINICAL DATA:  Acute onset of severe generalized abdominal pain, nausea and cold chills. Initial encounter. EXAM: CT ABDOMEN AND PELVIS WITH CONTRAST TECHNIQUE: Multidetector CT imaging of the abdomen and pelvis was performed using the standard protocol following bolus administration of intravenous contrast. CONTRAST:  192mL ISOVUE-300 IOPAMIDOL (ISOVUE-300) INJECTION 61% COMPARISON:  CT of the abdomen and pelvis, and MRCP performed  12/01/2013 FINDINGS: Lower chest: Diffuse coronary artery calcifications are seen. The visualized lung bases are grossly clear. Hepatobiliary: There is diffuse prominence of the intrahepatic biliary ducts, within normal limits status post cholecystectomy. Clips are noted at the gallbladder fossa. The liver is otherwise unremarkable. The common bile duct is prominent, but likely within normal limits status post cholecystectomy. Pancreas: The pancreas is within normal limits. Spleen: The spleen is unremarkable in appearance. Adrenals/Urinary Tract: The adrenal glands are unremarkable in appearance. Nonspecific perinephric stranding is noted bilaterally. There is no evidence of hydronephrosis. No renal or ureteral stones are identified. A left renal cyst is noted. Stomach/Bowel: The stomach is unremarkable in appearance. The small bowel is within normal limits. The appendix is normal in caliber, without evidence of appendicitis. Scattered diverticulosis is noted along the sigmoid colon, without evidence of diverticulitis. Vascular/Lymphatic: Scattered calcification is seen along the abdominal aorta and its branches. The abdominal aorta is otherwise grossly unremarkable. The inferior vena cava is grossly unremarkable. No retroperitoneal lymphadenopathy is seen. No pelvic sidewall lymphadenopathy is identified. Reproductive: The bladder is mildly distended and grossly remarkable. The prostate remains normal in size. Other:  A small left inguinal hernia is noted, containing only fat. Musculoskeletal: No acute osseous abnormalities are identified. Vacuum phenomenon is noted at the upper lumbar spine. Bilateral hip arthroplasties appear grossly intact. The visualized musculature is unremarkable in appearance. IMPRESSION: 1. No acute abnormality seen to explain the patient's symptoms. 2. Scattered aortic atherosclerosis. 3. Diverticulosis along the sigmoid colon, without evidence of diverticulitis. 4. Diffuse coronary artery  calcifications seen. 5. Left renal cyst noted. 6. Small left inguinal hernia, containing only fat. Electronically Signed   By: Garald Balding M.D.   On: 10/15/2016 02:06    Procedures Procedures (including critical care time)  Medications Ordered in ED Medications  gi cocktail (Maalox,Lidocaine,Donnatal) (not administered)  ondansetron (ZOFRAN) injection 4 mg (4 mg Intravenous Given 10/15/16 0022)  morphine 4 MG/ML injection 4 mg (4 mg Intravenous Given 10/15/16 0022)  sodium chloride 0.9 % bolus 500 mL (0 mLs Intravenous Stopped 10/15/16 0346)  iopamidol (ISOVUE-300) 61 % injection (30 mLs  Contrast Given 10/15/16 0117)  iopamidol (ISOVUE-300) 61 % injection 100 mL (100 mLs Intravenous Contrast Given 10/15/16 0117)     Initial Impression / Assessment and Plan /  ED Course  I have reviewed the triage vital signs and the nursing notes.  Pertinent labs & imaging results that were available during my care of the patient were reviewed by me and considered in my medical decision making (see chart for details).  Abdominal pain of uncertain cause. With history of abdominal surgery, consider small bowel obstruction. Other possibilities include appendicitis, diverticulitis, pancreatitis. Screening labs are obtained. Old records are reviewed showing that he had a prior admission for gallstone pancreatitis. He will be sent for CT of abdomen and pelvis. He is given morphine for pain, ondansetron for nausea, and is given some IV fluids.   He feels much better after above noted treatment CT showed no acute process. Laboratory evaluation showed mild hyponatremia, which is not felt to be clinically significant. With significant, he has moderate elevation of liver enzymes with AST 410, ALT 449, alkaline phosphatase 242, and bilirubin 5.1. There were no dilated hepatic ducts seen on CT scan, so this is likely hepatitis-presumably viral. He will need ultrasound, and probably hepatitis panel. Unfortunately, his PCP died  suddenly, and he has not obtained a replacement. I'm concerned about lack of good follow-up. He is admitted for further hydration and testing. Case is discussed with Dr. Darrick Meigs of [facility] who agrees to admit the patient.  Final Clinical Impressions(s) / ED Diagnoses   Final diagnoses:  Periumbilical pain  Jaundice  Elevated liver enzymes  Elevated lipase  Hyponatremia    New Prescriptions New Prescriptions   No medications on file     Delora Fuel, MD 94/80/16 0530

## 2016-10-14 NOTE — ED Triage Notes (Signed)
Pt c/o severe generalized abd pain with nausea and cold chills since yesterday.

## 2016-10-15 ENCOUNTER — Emergency Department (HOSPITAL_COMMUNITY): Payer: Medicare HMO

## 2016-10-15 ENCOUNTER — Observation Stay (HOSPITAL_COMMUNITY): Payer: Medicare HMO

## 2016-10-15 DIAGNOSIS — R109 Unspecified abdominal pain: Secondary | ICD-10-CM | POA: Diagnosis not present

## 2016-10-15 DIAGNOSIS — E1121 Type 2 diabetes mellitus with diabetic nephropathy: Secondary | ICD-10-CM | POA: Diagnosis not present

## 2016-10-15 DIAGNOSIS — Z794 Long term (current) use of insulin: Secondary | ICD-10-CM | POA: Diagnosis not present

## 2016-10-15 DIAGNOSIS — R740 Nonspecific elevation of levels of transaminase and lactic acid dehydrogenase [LDH]: Secondary | ICD-10-CM | POA: Diagnosis not present

## 2016-10-15 DIAGNOSIS — R748 Abnormal levels of other serum enzymes: Secondary | ICD-10-CM | POA: Diagnosis not present

## 2016-10-15 DIAGNOSIS — E039 Hypothyroidism, unspecified: Secondary | ICD-10-CM | POA: Diagnosis not present

## 2016-10-15 DIAGNOSIS — I481 Persistent atrial fibrillation: Secondary | ICD-10-CM | POA: Diagnosis not present

## 2016-10-15 DIAGNOSIS — R1013 Epigastric pain: Secondary | ICD-10-CM | POA: Diagnosis not present

## 2016-10-15 LAB — GLUCOSE, CAPILLARY
GLUCOSE-CAPILLARY: 98 mg/dL (ref 65–99)
Glucose-Capillary: 175 mg/dL — ABNORMAL HIGH (ref 65–99)
Glucose-Capillary: 168 mg/dL — ABNORMAL HIGH (ref 65–99)
Glucose-Capillary: 127 mg/dL — ABNORMAL HIGH (ref 65–99)

## 2016-10-15 LAB — URINALYSIS, ROUTINE W REFLEX MICROSCOPIC
BACTERIA UA: NONE SEEN
Bilirubin Urine: NEGATIVE
GLUCOSE, UA: 50 mg/dL — AB
Ketones, ur: 5 mg/dL — AB
Leukocytes, UA: NEGATIVE
Nitrite: NEGATIVE
PROTEIN: NEGATIVE mg/dL
SQUAMOUS EPITHELIAL / LPF: NONE SEEN
Specific Gravity, Urine: >1.046 — ABNORMAL HIGH (ref 1.005–1.030)
pH: 5 (ref 5.0–8.0)

## 2016-10-15 LAB — COMPREHENSIVE METABOLIC PANEL
ALT: 449 U/L — AB (ref 17–63)
AST: 410 U/L — AB (ref 15–41)
Albumin: 4.1 g/dL (ref 3.5–5.0)
Alkaline Phosphatase: 242 U/L — ABNORMAL HIGH (ref 38–126)
Anion gap: 11 (ref 5–15)
BUN: 19 mg/dL (ref 6–20)
CHLORIDE: 92 mmol/L — AB (ref 101–111)
CO2: 28 mmol/L (ref 22–32)
CREATININE: 1.22 mg/dL (ref 0.61–1.24)
Calcium: 9.4 mg/dL (ref 8.9–10.3)
GFR calc Af Amer: >60 mL/min (ref >60)
GFR calc non Af Amer: 54 mL/min — ABNORMAL LOW (ref >60)
Glucose, Bld: 189 mg/dL — ABNORMAL HIGH (ref 65–99)
Potassium: 3.7 mmol/L (ref 3.5–5.1)
SODIUM: 131 mmol/L — AB (ref 135–145)
Total Bilirubin: 5.1 mg/dL — ABNORMAL HIGH (ref 0.3–1.2)
Total Protein: 7.4 g/dL (ref 6.5–8.1)

## 2016-10-15 LAB — PROTIME-INR
INR: 3.07
Prothrombin Time: 32.4 seconds — ABNORMAL HIGH (ref 11.4–15.2)

## 2016-10-15 LAB — CBC WITH DIFFERENTIAL/PLATELET
Basophils Absolute: 0 10*3/uL (ref 0.0–0.1)
Basophils Relative: 0 %
EOS PCT: 0 %
Eosinophils Absolute: 0 10*3/uL (ref 0.0–0.7)
HCT: 42.6 % (ref 39.0–52.0)
Hemoglobin: 14.7 g/dL (ref 13.0–17.0)
LYMPHS ABS: 0.5 10*3/uL — AB (ref 0.7–4.0)
LYMPHS PCT: 4 %
MCH: 30.8 pg (ref 26.0–34.0)
MCHC: 34.5 g/dL (ref 30.0–36.0)
MCV: 89.1 fL (ref 78.0–100.0)
MONO ABS: 1.4 10*3/uL — AB (ref 0.1–1.0)
Monocytes Relative: 11 %
Neutro Abs: 11.2 10*3/uL — ABNORMAL HIGH (ref 1.7–7.7)
Neutrophils Relative %: 85 %
PLATELETS: 170 10*3/uL (ref 150–400)
RBC: 4.78 MIL/uL (ref 4.22–5.81)
RDW: 12.7 % (ref 11.5–15.5)
WBC: 13.1 10*3/uL — ABNORMAL HIGH (ref 4.0–10.5)

## 2016-10-15 LAB — ACETAMINOPHEN LEVEL

## 2016-10-15 LAB — LIPASE, BLOOD: Lipase: 57 U/L — ABNORMAL HIGH (ref 11–51)

## 2016-10-15 LAB — BILIRUBIN, FRACTIONATED(TOT/DIR/INDIR)
BILIRUBIN DIRECT: 3.4 mg/dL — AB (ref 0.1–0.5)
BILIRUBIN TOTAL: 5.5 mg/dL — AB (ref 0.3–1.2)
Indirect Bilirubin: 2.1 mg/dL — ABNORMAL HIGH (ref 0.3–0.9)

## 2016-10-15 MED ORDER — DILTIAZEM HCL ER COATED BEADS 120 MG PO CP24
120.0000 mg | ORAL_CAPSULE | Freq: Every day | ORAL | Status: DC
  Administered 2016-10-15: 120 mg via ORAL
  Filled 2016-10-15 (×2): qty 1

## 2016-10-15 MED ORDER — INSULIN ASPART 100 UNIT/ML ~~LOC~~ SOLN
0.0000 [IU] | Freq: Three times a day (TID) | SUBCUTANEOUS | Status: DC
  Administered 2016-10-15 (×2): 2 [IU] via SUBCUTANEOUS
  Administered 2016-10-15 – 2016-10-16 (×2): 1 [IU] via SUBCUTANEOUS
  Administered 2016-10-16: 2 [IU] via SUBCUTANEOUS
  Administered 2016-10-16 – 2016-10-17 (×3): 1 [IU] via SUBCUTANEOUS
  Administered 2016-10-18 (×2): 2 [IU] via SUBCUTANEOUS

## 2016-10-15 MED ORDER — HYDROCHLOROTHIAZIDE 12.5 MG PO CAPS
12.5000 mg | ORAL_CAPSULE | Freq: Every day | ORAL | Status: DC
  Administered 2016-10-15: 12.5 mg via ORAL
  Filled 2016-10-15 (×2): qty 1

## 2016-10-15 MED ORDER — ONDANSETRON HCL 4 MG PO TABS: 4.0000 mg | ORAL_TABLET | Freq: Four times a day (QID) | ORAL | Status: DC | PRN

## 2016-10-15 MED ORDER — ENOXAPARIN SODIUM 40 MG/0.4ML ~~LOC~~ SOLN: 40.0000 mg | SUBCUTANEOUS | Status: DC

## 2016-10-15 MED ORDER — LEVOTHYROXINE SODIUM 50 MCG PO TABS
50.0000 ug | ORAL_TABLET | Freq: Every day | ORAL | Status: DC
  Administered 2016-10-15 – 2016-10-18 (×4): 50 ug via ORAL
  Filled 2016-10-15 (×4): qty 1

## 2016-10-15 MED ORDER — LISINOPRIL 10 MG PO TABS
20.0000 mg | ORAL_TABLET | Freq: Every day | ORAL | Status: DC
  Administered 2016-10-15: 20 mg via ORAL
  Filled 2016-10-15 (×2): qty 2

## 2016-10-15 MED ORDER — FAMOTIDINE 20 MG PO TABS
20.0000 mg | ORAL_TABLET | Freq: Every day | ORAL | Status: DC
  Administered 2016-10-15 – 2016-10-18 (×4): 20 mg via ORAL
  Filled 2016-10-15 (×4): qty 1

## 2016-10-15 MED ORDER — SODIUM CHLORIDE 0.9 % IV SOLN
INTRAVENOUS | Status: DC
  Administered 2016-10-15 – 2016-10-16 (×3): via INTRAVENOUS

## 2016-10-15 MED ORDER — DOXAZOSIN MESYLATE 2 MG PO TABS
2.0000 mg | ORAL_TABLET | Freq: Every day | ORAL | Status: DC
  Administered 2016-10-17 (×2): 2 mg via ORAL
  Filled 2016-10-15 (×2): qty 1

## 2016-10-15 MED ORDER — ONDANSETRON HCL 4 MG/2ML IJ SOLN
4.0000 mg | Freq: Four times a day (QID) | INTRAMUSCULAR | Status: DC | PRN
  Administered 2016-10-16: 4 mg via INTRAVENOUS
  Filled 2016-10-15: qty 2

## 2016-10-15 MED ORDER — LISINOPRIL-HYDROCHLOROTHIAZIDE 20-12.5 MG PO TABS: 1.0000 | ORAL_TABLET | Freq: Every day | ORAL | Status: DC

## 2016-10-15 MED ORDER — GI COCKTAIL ~~LOC~~
30.0000 mL | Freq: Once | ORAL | Status: AC
  Administered 2016-10-15: 30 mL via ORAL
  Filled 2016-10-15: qty 30

## 2016-10-15 MED ORDER — IOPAMIDOL (ISOVUE-300) INJECTION 61%
INTRAVENOUS | Status: AC
  Administered 2016-10-15: 30 mL
  Filled 2016-10-15: qty 30

## 2016-10-15 MED ORDER — METOPROLOL SUCCINATE ER 50 MG PO TB24
50.0000 mg | ORAL_TABLET | Freq: Every day | ORAL | Status: DC
  Administered 2016-10-15: 50 mg via ORAL
  Filled 2016-10-15 (×2): qty 1

## 2016-10-15 MED ORDER — ASPIRIN EC 81 MG PO TBEC
81.0000 mg | DELAYED_RELEASE_TABLET | Freq: Every day | ORAL | Status: DC
  Administered 2016-10-15 – 2016-10-16 (×2): 81 mg via ORAL
  Filled 2016-10-15 (×2): qty 1

## 2016-10-15 MED ORDER — IOPAMIDOL (ISOVUE-300) INJECTION 61%
100.0000 mL | Freq: Once | INTRAVENOUS | Status: AC | PRN
  Administered 2016-10-15: 100 mL via INTRAVENOUS

## 2016-10-15 NOTE — Care Management Note (Addendum)
Case Management Note  Patient Details  Name: Brett [name] MRN: [id number] Date of Birth: [birth year]  Subjective/Objective:                  Pt admitted with abd pain. He is from home, lives with his gf and is ind with ADL's. Pt has no DME or HH needs pta. He is not homebound, drives himself to appointments. His PCP recently died and he is requesting list of providers. Pt has insurance with drug coverage and has no difficulty affording medications. Pt wearing oxygen acutely.   Action/Plan: He plans to return home with self care. CM will follow to DC.   Expected Discharge Date:  10/17/16               Expected Discharge Plan:  Home/Self Care  In-House Referral:  NA  Discharge planning Services  CM Consult  Post Acute Care Choice:  NA Choice offered to:  NA  Status of Service:  Completed, signed off  Sherald Barge, RN 10/15/2016, 10:54 AM

## 2016-10-15 NOTE — Care Management Obs Status (Signed)
[facility] NOTIFICATION   Patient Details  Name: Brett [name] MRN: [id number] Date of Birth: October 27, 1932   Medicare Observation Status Notification Given:  Yes    Sherald Barge, RN 10/15/2016, 10:54 AM

## 2016-10-15 NOTE — H&P (Signed)
TRH H&P    Patient Demographics:    Jafeth Mustin, is a 83 y.o. male  MRN: [id number]  DOB - Apr 26, 1932  Admit Date - 10/14/2016  Referring MD/NP/PA: Dr. Roxanne Mins  Outpatient Primary MD for the patient is [name], MD  Patient coming from: Home  Chief Complaint  Patient presents with  . Abdominal Pain      HPI:    Donshay Lupinski  is a 83 y.o. male, With history of atrial fibrillation on anticoagulation with Coumadin, hypertension, diabetes mellitus, hypothyroidism who came to hospital with worsening abdominal pain of one-day duration. Patient says that on Saturday he had a banquet at his son's house. An next day he started having abdominal pain. Pain was constant and got worse today so he came for further evaluation. He complained of nausea but no vomiting. He had loose stools, but has loose stools since he had cholecystectomy 1-1/2 years ago. Pain got relieved after patient received morphine in the ED. He denies fever or chills. No chest pain or shortness of breath. No dysuria fever.  In the ED, lab work revealed transaminitis with AST 410, ALT 449, alkaline phosphatase 242, lipase 57. CT of the abdomen pelvis is unremarkable.   Review of systems:    In addition to the HPI above,  No Fever-chills, No Headache, No changes with Vision or hearing, No problems swallowing food or Liquids, No Chest pain, Cough or Shortness of Breath, No Blood in stool or Urine, No dysuria, No new skin rashes or bruises, No new joints pains-aches,  No new weakness, tingling, numbness in any extremity, No recent weight gain or loss, No polyuria, polydypsia or polyphagia, No significant Mental Stressors.  All other systems reviewed and are negative.   With Past History of the following :    Past Medical History:  Diagnosis Date  . Atrial fibrillation ([place])    On coumadin  . Chronic kidney disease (CKD), stage III  (moderate)   . Dysrhythmia    AFib  . Essential hypertension, benign   . History of cardiac catheterization    Details not certain - reportedly no interventions  . Hypothyroidism   . Macular degeneration   . Pulmonary hypertension ([place]) 12/03/2013   PA pressure 56. Ejection fraction 55-60%  . Type 2 diabetes mellitus ([place])       Past Surgical History:  Procedure Laterality Date  . CARDIOVERSION N/A 12/10/2013   Procedure: CARDIOVERSION;  Surgeon: Herminio Commons, MD;  Location: [facility];  Service: Endoscopy;  Laterality: N/A;  . CATARACT EXTRACTION W/PHACO Left 11/13/2015   Procedure: CATARACT EXTRACTION PHACO AND INTRAOCULAR LENS PLACEMENT LEFT EYE CDE=7.76;  Surgeon: Williams Che, MD;  Location: [facility];  Service: Ophthalmology;  Laterality: Left;  . CHOLECYSTECTOMY    . COLONOSCOPY     Remote > 10 years ago  . REPLACEMENT TOTAL HIP W/  RESURFACING IMPLANTS Bilateral   . TEE WITHOUT CARDIOVERSION N/A 12/10/2013   Procedure: TRANSESOPHAGEAL ECHOCARDIOGRAM (TEE);  Surgeon: Herminio Commons, MD;  Location: [facility];  Service: Endoscopy;  Laterality: N/A;      Social History:      Social History  Substance Use Topics  . Smoking status: Former Smoker    Packs/day: 1.00    Years: 37.00    Types: Cigarettes    Start date: 11/01/1952    Quit date: 08/18/1978  . Smokeless tobacco: Never Used  . Alcohol use 0.0 oz/week     Comment: Occasional       Family History :     Family History  Problem Relation Age of Onset  . Hypertension Mother   . Hypertension Father   . Colon cancer Neg Hx       Home Medications:   Prior to Admission medications   Medication Sig Start Date End Date Taking? Authorizing Provider  aspirin EC 81 MG tablet Take 1 tablet (81 mg total) by mouth daily. 02/15/15  Yes Herminio Commons, MD  diltiazem (CARTIA XT) 120 MG 24 hr capsule Take 1 capsule (120 mg total) by mouth daily. 02/15/16  Yes Herminio Commons, MD  diphenhydrAMINE (BENADRYL)  25 MG tablet Take 25 mg by mouth every 6 (six) hours as needed. For sleep   Yes [provider]  doxazosin (CARDURA) 2 MG tablet Take 1 tablet (2 mg total) by mouth at bedtime. 04/24/15  Yes Herminio Commons, MD  levothyroxine (SYNTHROID, LEVOTHROID) 50 MCG tablet Take 50 mcg by mouth daily.   Yes [provider]  lisinopril-hydrochlorothiazide (PRINZIDE,ZESTORETIC) 20-12.5 MG tablet Take 1 tablet by mouth daily.   Yes [provider]  metFORMIN (GLUCOPHAGE) 500 MG tablet Take 1,000 mg by mouth at bedtime.   Yes [provider]  metoprolol succinate (TOPROL-XL) 50 MG 24 hr tablet Take 1 tablet (50 mg total) by mouth daily. Take with or immediately following a meal. 04/24/15  Yes Herminio Commons, MD  pravastatin (PRAVACHOL) 40 MG tablet Take 80 mg by mouth daily.   Yes [provider]  warfarin (COUMADIN) 3 MG tablet Take 3 mg by mouth daily.   Yes [provider]     Allergies:    No Known Allergies   Physical Exam:   Vitals  Blood pressure 106/75, pulse 79, temperature 97.7 F (36.5 C), temperature source Oral, resp. rate 18, height 6' (1.829 m), weight 97.5 kg (215 lb), SpO2 93 %.  1.  General: Appears in no acute distress  2. Psychiatric:  Intact judgement and  insight, awake alert, oriented x 3.  3. Neurologic: No focal neurological deficits, all cranial nerves intact.Strength 5/5 all 4 extremities, sensation intact all 4 extremities, plantars down going.  4. Eyes :  anicteric sclerae, moist conjunctivae with no lid lag. PERRLA.  5. ENMT:  Oropharynx clear with moist mucous membranes and good dentition  6. Neck:  supple, no cervical lymphadenopathy appriciated, No thyromegaly  7. Respiratory : Normal respiratory effort, good air movement bilaterally,clear to  auscultation bilaterally  8. Cardiovascular : RRR, no gallops, rubs or murmurs, no leg edema  9. Gastrointestinal:  Positive bowel sounds, abdomen  soft,distended, mild tenderness to palpation in epigastric region ,no hepatosplenomegaly, no rigidity or guarding       10. Skin:  No cyanosis, normal texture and turgor, no rash, lesions or ulcers  11.Musculoskeletal:  Good muscle tone,  joints appear normal , no effusions,  normal range of motion    Data Review:    CBC  Recent Labs Lab 10/15/16 0025  WBC 13.1*  HGB 14.7  HCT 42.6  PLT 170  MCV 89.1  MCH 30.8  MCHC 34.5  RDW 12.7  LYMPHSABS 0.5*  MONOABS 1.4*  EOSABS 0.0  BASOSABS 0.0   ------------------------------------------------------------------------------------------------------------------  Chemistries   Recent Labs Lab 10/15/16 0025  NA 131*  K 3.7  CL 92*  CO2 28  GLUCOSE 189*  BUN 19  CREATININE 1.22  CALCIUM 9.4  AST 410*  ALT 449*  ALKPHOS 242*  BILITOT 5.1*   ------------------------------------------------------------------------------------------------------------------  ------------------------------------------------------------------------------------------------------------------ GFR: Estimated Creatinine Clearance: 57.5 mL/min (by C-G formula based on SCr of 1.22 mg/dL). Liver Function Tests:  Recent Labs Lab 10/15/16 0025  AST 410*  ALT 449*  ALKPHOS 242*  BILITOT 5.1*  PROT 7.4  ALBUMIN 4.1    Recent Labs Lab 10/15/16 0025  LIPASE 57*   No results for input(s): AMMONIA in the last 168 hours. Coagulation Profile: No results for input(s): INR, PROTIME in the last 168 hours. Cardiac Enzymes: No results for input(s): CKTOTAL, CKMB, CKMBINDEX, TROPONINI in the last 168 hours. BNP (last 3 results) No results for input(s): PROBNP in the last 8760 hours. HbA1C: No results for input(s): HGBA1C in the last 72 hours. CBG: No results for input(s): GLUCAP in the last 168 hours. Lipid Profile: No results for input(s): CHOL, HDL, LDLCALC, TRIG, CHOLHDL, LDLDIRECT in the last 72 hours. Thyroid Function Tests: No results  for input(s): TSH, T4TOTAL, FREET4, T3FREE, THYROIDAB in the last 72 hours. Anemia Panel: No results for input(s): VITAMINB12, FOLATE, FERRITIN, TIBC, IRON, RETICCTPCT in the last 72 hours.  --------------------------------------------------------------------------------------------------------------- Urine analysis:    Component Value Date/Time   COLORURINE AMBER (A) 10/15/2016 0320   APPEARANCEUR CLEAR 10/15/2016 0320   LABSPEC >1.046 (H) 10/15/2016 0320   PHURINE 5.0 10/15/2016 0320   GLUCOSEU 50 (A) 10/15/2016 0320   HGBUR MODERATE (A) 10/15/2016 0320   BILIRUBINUR NEGATIVE 10/15/2016 0320   KETONESUR 5 (A) 10/15/2016 0320   PROTEINUR NEGATIVE 10/15/2016 0320   UROBILINOGEN 0.2 12/01/2013 0740   NITRITE NEGATIVE 10/15/2016 0320   LEUKOCYTESUR NEGATIVE 10/15/2016 0320      Imaging Results:    Ct Abdomen Pelvis W Contrast  Result Date: 10/15/2016 CLINICAL DATA:  Acute onset of severe generalized abdominal pain, nausea and cold chills. Initial encounter. EXAM: CT ABDOMEN AND PELVIS WITH CONTRAST TECHNIQUE: Multidetector CT imaging of the abdomen and pelvis was performed using the standard protocol following bolus administration of intravenous contrast. CONTRAST:  161mL ISOVUE-300 IOPAMIDOL (ISOVUE-300) INJECTION 61% COMPARISON:  CT of the abdomen and pelvis, and MRCP performed 12/01/2013 FINDINGS: Lower chest: Diffuse coronary artery calcifications are seen. The visualized lung bases are grossly clear. Hepatobiliary: There is diffuse prominence of the intrahepatic biliary ducts, within normal limits status post cholecystectomy. Clips are noted at the gallbladder fossa. The liver is otherwise unremarkable. The common bile duct is prominent, but likely within normal limits status post cholecystectomy. Pancreas: The pancreas is within normal limits. Spleen: The spleen is unremarkable in appearance. Adrenals/Urinary Tract: The adrenal glands are unremarkable in appearance. Nonspecific  perinephric stranding is noted bilaterally. There is no evidence of hydronephrosis. No renal or ureteral stones are identified. A left renal cyst is noted. Stomach/Bowel: The stomach is unremarkable in appearance. The small bowel is within normal limits. The appendix is normal in caliber, without evidence of appendicitis. Scattered diverticulosis is noted along the sigmoid colon, without evidence of diverticulitis. Vascular/Lymphatic: Scattered calcification is seen along the abdominal aorta and its branches. The abdominal aorta is otherwise grossly unremarkable. The inferior vena cava is grossly unremarkable. No retroperitoneal lymphadenopathy is  seen. No pelvic sidewall lymphadenopathy is identified. Reproductive: The bladder is mildly distended and grossly remarkable. The prostate remains normal in size. Other:  A small left inguinal hernia is noted, containing only fat. Musculoskeletal: No acute osseous abnormalities are identified. Vacuum phenomenon is noted at the upper lumbar spine. Bilateral hip arthroplasties appear grossly intact. The visualized musculature is unremarkable in appearance. IMPRESSION: 1. No acute abnormality seen to explain the patient's symptoms. 2. Scattered aortic atherosclerosis. 3. Diverticulosis along the sigmoid colon, without evidence of diverticulitis. 4. Diffuse coronary artery calcifications seen. 5. Left renal cyst noted. 6. Small left inguinal hernia, containing only fat. Electronically Signed   By: Garald Balding M.D.   On: 10/15/2016 02:06       Assessment & Plan:    Active Problems:   Type 2 diabetes mellitus with diabetic nephropathy (HCC)   Hypothyroidism   Transaminitis   A-fib (HCC)   Abdominal pain   1. Transaminitis- patient has elevated AST, ALT, alkaline phosphatase. Will obtain hepatitis panel, abdominal ultrasound in a.m. hold Pravachol. Patient is status post cholecystectomy 1-1/2 years ago. 2. Abdominal pain- likely GERD, exacerbated by eating in  banquet on Saturday. Start Pepcid 20 mg by mouth daily. Also given 1 dose of GI cocktail. 3. Atrial fibrillation-heart is controlled, continue Cardizem. Warfarin per pharmacy consultation. 4. Hypothyroidism-continue Synthroid. 5. Diabetes mellitus- hold metformin, start sliding scale insulin with NovoLog. 6. Hypertension-blood pressure is controlled, continue Zestoretic, metoprolol.   DVT Prophylaxis-   Warfarin  AM Labs Ordered, also please review Full Orders  Family Communication: Admission, patients condition and plan of care including tests being ordered have been discussed with the patient  who indicate understanding and agree with the plan and Code Status.  Code Status:  Full code  Admission status: Observation    Time spent in minutes : 60 minutes   LAMA,GAGAN S M.D on 10/15/2016 at 5:42 AM  Between 7am to 7pm - Pager - [phone]. After 7pm go to [email or web address] - password [facility]  [facility] - Office  [phone]

## 2016-10-15 NOTE — ED Notes (Signed)
Patient transported to CT 

## 2016-10-15 NOTE — ED Notes (Signed)
Urine specimen obtained and taken to lab.

## 2016-10-15 NOTE — Progress Notes (Signed)
ANTICOAGULATION CONSULT NOTE - Initial Consult  Pharmacy Consult for Coumadin Indication: atrial fibrillation   Patient Measurements: Height: 6' (182.9 cm) Weight: 213 lb 10 oz (96.9 kg) IBW/kg (Calculated) : 77.6  Vital Signs: Temp: 98.2 F (36.8 C) (07/03 0624) Temp Source: Oral (07/03 0624) BP: 107/67 (07/03 0624) Pulse Rate: 85 (07/03 0624)  Labs:  Recent Labs  10/15/16 0025  HGB 14.7  HCT 42.6  PLT 170  LABPROT 32.4*  INR 3.07  CREATININE 1.22    Estimated Creatinine Clearance: 57.3 mL/min (by C-G formula based on SCr of 1.22 mg/dL).   Medical History: Past Medical History:  Diagnosis Date  . Atrial fibrillation ([place])    On coumadin  . Chronic kidney disease (CKD), stage III (moderate)   . Dysrhythmia    AFib  . Essential hypertension, benign   . History of cardiac catheterization    Details not certain - reportedly no interventions  . Hypothyroidism   . Macular degeneration   . Pulmonary hypertension ([place]) 12/03/2013   PA pressure 56. Ejection fraction 55-60%  . Type 2 diabetes mellitus ([facility])     Medications:  Prescriptions Prior to Admission  Medication Sig Dispense Refill Last Dose  . aspirin EC 81 MG tablet Take 1 tablet (81 mg total) by mouth daily.   10/14/2016 at Unknown time  . diltiazem (CARTIA XT) 120 MG 24 hr capsule Take 1 capsule (120 mg total) by mouth daily. 90 capsule 3 10/14/2016 at Unknown time  . diphenhydrAMINE (BENADRYL) 25 MG tablet Take 25 mg by mouth every 6 (six) hours as needed. For sleep   10/14/2016 at Unknown time  . doxazosin (CARDURA) 2 MG tablet Take 1 tablet (2 mg total) by mouth at bedtime. 90 tablet 3 10/14/2016 at Unknown time  . levothyroxine (SYNTHROID, LEVOTHROID) 50 MCG tablet Take 50 mcg by mouth daily.   10/14/2016 at Unknown time  . lisinopril-hydrochlorothiazide (PRINZIDE,ZESTORETIC) 20-12.5 MG tablet Take 1 tablet by mouth daily.   10/14/2016 at Unknown time  . metFORMIN (GLUCOPHAGE) 500 MG tablet Take 500 mg by mouth 2  (two) times daily with a meal.    10/14/2016 at Unknown time  . metoprolol succinate (TOPROL-XL) 50 MG 24 hr tablet Take 1 tablet (50 mg total) by mouth daily. Take with or immediately following a meal. 90 tablet 3 10/14/2016 at 0730  . polyvinyl alcohol (LIQUIFILM TEARS) 1.4 % ophthalmic solution Place 1 drop into both eyes as needed for dry eyes.   Past Week at Unknown time  . pravastatin (PRAVACHOL) 80 MG tablet Take 1 tablet by mouth daily.   10/14/2016 at Unknown time  . warfarin (COUMADIN) 3 MG tablet Take 3 mg by mouth daily at 6 PM.    10/14/2016 at 2300    Assessment: 83 y.o. male, with history of atrial fibrillation on anticoagulation with Coumadin, hypertension, diabetes mellitus, hypothyroidism who came to hospital with worsening abdominal pain of one-day duration. Pharmacy asked to dose Coumadin. INR 3.07, slightly elevated.  Goal of Therapy:  INR 2-3 Monitor platelets by anticoagulation protocol: Yes   Plan:  No coumadin today PT-INR daily Monitor for S/S of bleeding  Isac Sarna, BS Vena [place], BCPS Clinical Pharmacist Pager [phone] 10/15/2016,9:43 AM

## 2016-10-15 NOTE — Progress Notes (Signed)
PROGRESS NOTE  Brett [name]  PPI:[id number] DOB: 1932/10/02 DOA: 10/14/2016 PCP: [name], MD  Brief Narrative:   The patient is an 83 year old male with history of A. fib on Coumadin, hypertension, diabetes mellitus, hypothyroidism who presented to the hospital with a one-day history of worsening right upper quadrant abdominal pain similar to when he had previously had gallstones. He is status post laparoscopic cholecystectomy. She had nausea without vomiting.  He has chronic loose stools since cholecystectomy 1-1/2 years ago. His pain improved with morphine and has not recurred. He was found to have a transaminitis with AST 410, ALT 449, lipase 57. His CT of the abdomen and pelvis was unremarkable and confirmed cholecystectomy. He had dilated common bile duct and intrahepatic ducts which were attributed to his previous cholecystectomy.  Assessment & Plan:   Active Problems:   Type 2 diabetes mellitus with diabetic nephropathy (HCC)   Hypothyroidism   Transaminitis   A-fib (HCC)   Abdominal pain  Right upper quadrant abdominal pain, and concerned that he had maybe an undetected choledocholith some obstructing sludge. His abdominal ultrasound confirmed what had already been seen on CT but did not offer any new explanation for his abdominal pain. He may have contracted hepatitis or another viral illness that caused his transaminitis but often times hepatitis is a symptomatic. -  Follow-up acute hepatitis panel -  Check acetaminophen level -  Check direct versus indirect bilirubin levels -  Continue to hold statin -  Consider MRCP depending on his hepatitis levels and his bilirubin fractionation -  Continue when necessary morphine for abdominal pain -  Continue Pepcid  Rate control persistent atrial fibrillation, stable -  continue Cardizem, metoprolol  -  warfarin per pharmacy  Hypothyroidism, stable, continue Synthroid  Diabetes mellitus type 2, no recent A1c on file -  Hold  metformin -  Continue SSI   Hypertension, stable blood pressure, continue metoprolol and lis-HCTZ and dilt  DVT prophylaxis:  Therapeutic warfarin Code Status:  Full code Family Communication:  Patient alone Disposition Plan:  Home pending acute hepatitis panel, bilirubin fractionation   Consultants:   None  Procedures:  None  Antimicrobials:  Anti-infectives    None       Subjective: States he had severe right upper quadrant pain that was small to when he had gallstones. Pain resolved after a dose of morphine and has not recurred. He has had diarrhea but it is similar to prior. Nausea and vomiting are somewhat better today.  Objective: Vitals:   10/14/16 2333 10/14/16 2335 10/15/16 0527 10/15/16 0624  BP:  121/82 106/75 107/67  Pulse:  (!) 111 79 85  Resp:  18 18 18   Temp:  97.7 F (36.5 C) 97.7 F (36.5 C) 98.2 F (36.8 C)  TempSrc:  Oral Oral Oral  SpO2:  95% 93% 97%  Weight: 97.5 kg (215 lb)   96.9 kg (213 lb 10 oz)  Height: 6' (1.829 m)   6' (1.829 m)    Intake/Output Summary (Last 24 hours) at 10/15/16 1246 Last data filed at 10/15/16 0346  Gross per 24 hour  Intake              500 ml  Output                0 ml  Net              500 ml   Filed Weights   10/14/16 2333 10/15/16 0624  Weight: 97.5 kg (  215 lb) 96.9 kg (213 lb 10 oz)    Examination:  General exam:  Adult Male, not particularly jaundiced.  No acute distress.  HEENT:  NCAT, MMM Respiratory system: Clear to auscultation bilaterally Cardiovascular system: IRRR, normal S1/S2. No murmurs, rubs, gallops or clicks.  Warm extremities Gastrointestinal system: Normal active bowel sounds, soft, nondistended, nontender. MSK:  Normal tone and bulk, no lower extremity edema Neuro:  Grossly intact    Data Reviewed: I have personally reviewed following labs and imaging studies  CBC:  Recent Labs Lab 10/15/16 0025  WBC 13.1*  NEUTROABS 11.2*  HGB 14.7  HCT 42.6  MCV 89.1  PLT 160    Basic Metabolic Panel:  Recent Labs Lab 10/15/16 0025  NA 131*  K 3.7  CL 92*  CO2 28  GLUCOSE 189*  BUN 19  CREATININE 1.22  CALCIUM 9.4   GFR: Estimated Creatinine Clearance: 57.3 mL/min (by C-G formula based on SCr of 1.22 mg/dL). Liver Function Tests:  Recent Labs Lab 10/15/16 0025  AST 410*  ALT 449*  ALKPHOS 242*  BILITOT 5.1*  PROT 7.4  ALBUMIN 4.1    Recent Labs Lab 10/15/16 0025  LIPASE 57*   No results for input(s): AMMONIA in the last 168 hours. Coagulation Profile:  Recent Labs Lab 10/15/16 0025  INR 3.07   Cardiac Enzymes: No results for input(s): CKTOTAL, CKMB, CKMBINDEX, TROPONINI in the last 168 hours. BNP (last 3 results) No results for input(s): PROBNP in the last 8760 hours. HbA1C: No results for input(s): HGBA1C in the last 72 hours. CBG:  Recent Labs Lab 10/15/16 0741 10/15/16 1103  GLUCAP 175* 168*   Lipid Profile: No results for input(s): CHOL, HDL, LDLCALC, TRIG, CHOLHDL, LDLDIRECT in the last 72 hours. Thyroid Function Tests: No results for input(s): TSH, T4TOTAL, FREET4, T3FREE, THYROIDAB in the last 72 hours. Anemia Panel: No results for input(s): VITAMINB12, FOLATE, FERRITIN, TIBC, IRON, RETICCTPCT in the last 72 hours. Urine analysis:    Component Value Date/Time   COLORURINE AMBER (A) 10/15/2016 0320   APPEARANCEUR CLEAR 10/15/2016 0320   LABSPEC >1.046 (H) 10/15/2016 0320   PHURINE 5.0 10/15/2016 0320   GLUCOSEU 50 (A) 10/15/2016 0320   HGBUR MODERATE (A) 10/15/2016 0320   BILIRUBINUR NEGATIVE 10/15/2016 0320   KETONESUR 5 (A) 10/15/2016 0320   PROTEINUR NEGATIVE 10/15/2016 0320   UROBILINOGEN 0.2 12/01/2013 0740   NITRITE NEGATIVE 10/15/2016 0320   LEUKOCYTESUR NEGATIVE 10/15/2016 0320   Sepsis Labs: @LABRCNTIP (procalcitonin:4,lacticidven:4)  )No results found for this or any previous visit (from the past 240 hour(s)).    Radiology Studies: US Abdomen Complete  Result Date: 10/15/2016 CLINICAL  DATA:  Abdominal pain for 1 day, elevated liver function tests, prior cholecystectomy, diabetes and hypertension EXAM: ABDOMEN ULTRASOUND COMPLETE COMPARISON:  CT abdomen pelvis of 10/15/2016 FINDINGS: Gallbladder: The gallbladder has previously been resected. No pain is present over the right upper quadrant. Common bile duct: Diameter: The common bile duct measures 8 mm in diameter which is within normal limits post cholecystectomy. Liver: The liver is inhomogeneous and echogenic consistent with diffuse fatty infiltration. Slight prominence of intrahepatic ducts is noted. , as was present on recent CT of the abdomen pelvis. IVC: No abnormality visualized. Pancreas: The pancreas is not well seen due to bowel gas but appear normal on recent CT. Spleen: The spleen measures 7.9 cm. Right Kidney: Length: 11.0 cm.  No hydronephrosis is seen. Left Kidney: Length: 12.3 cm. A cyst emanates from the inferior left kidney anteriorly of  2.9 cm. Abdominal aorta: Much of the abdominal aorta is obscured by bowel gas. Other findings: None. IMPRESSION: 1. Echogenic liver parenchyma consistent with fatty infiltration. 2. Intrahepatic biliary ductal dilatation as was present on recent CT of the abdomen pelvis in this patient post cholecystectomy. 3. The pancreas and abdominal aorta are obscured by bowel gas. 4. No hydronephrosis. Electronically Signed   By: Ivar Drape M.D.   On: 10/15/2016 09:39   Ct Abdomen Pelvis W Contrast  Result Date: 10/15/2016 CLINICAL DATA:  Acute onset of severe generalized abdominal pain, nausea and cold chills. Initial encounter. EXAM: CT ABDOMEN AND PELVIS WITH CONTRAST TECHNIQUE: Multidetector CT imaging of the abdomen and pelvis was performed using the standard protocol following bolus administration of intravenous contrast. CONTRAST:  139mL ISOVUE-300 IOPAMIDOL (ISOVUE-300) INJECTION 61% COMPARISON:  CT of the abdomen and pelvis, and MRCP performed 12/01/2013 FINDINGS: Lower chest: Diffuse coronary  artery calcifications are seen. The visualized lung bases are grossly clear. Hepatobiliary: There is diffuse prominence of the intrahepatic biliary ducts, within normal limits status post cholecystectomy. Clips are noted at the gallbladder fossa. The liver is otherwise unremarkable. The common bile duct is prominent, but likely within normal limits status post cholecystectomy. Pancreas: The pancreas is within normal limits. Spleen: The spleen is unremarkable in appearance. Adrenals/Urinary Tract: The adrenal glands are unremarkable in appearance. Nonspecific perinephric stranding is noted bilaterally. There is no evidence of hydronephrosis. No renal or ureteral stones are identified. A left renal cyst is noted. Stomach/Bowel: The stomach is unremarkable in appearance. The small bowel is within normal limits. The appendix is normal in caliber, without evidence of appendicitis. Scattered diverticulosis is noted along the sigmoid colon, without evidence of diverticulitis. Vascular/Lymphatic: Scattered calcification is seen along the abdominal aorta and its branches. The abdominal aorta is otherwise grossly unremarkable. The inferior vena cava is grossly unremarkable. No retroperitoneal lymphadenopathy is seen. No pelvic sidewall lymphadenopathy is identified. Reproductive: The bladder is mildly distended and grossly remarkable. The prostate remains normal in size. Other:  A small left inguinal hernia is noted, containing only fat. Musculoskeletal: No acute osseous abnormalities are identified. Vacuum phenomenon is noted at the upper lumbar spine. Bilateral hip arthroplasties appear grossly intact. The visualized musculature is unremarkable in appearance. IMPRESSION: 1. No acute abnormality seen to explain the patient's symptoms. 2. Scattered aortic atherosclerosis. 3. Diverticulosis along the sigmoid colon, without evidence of diverticulitis. 4. Diffuse coronary artery calcifications seen. 5. Left renal cyst noted. 6.  Small left inguinal hernia, containing only fat. Electronically Signed   By: Garald Balding M.D.   On: 10/15/2016 02:06     Scheduled Meds: . aspirin EC  81 mg Oral Daily  . diltiazem  120 mg Oral Daily  . doxazosin  2 mg Oral QHS  . famotidine  20 mg Oral Daily  . lisinopril  20 mg Oral Daily   And  . hydrochlorothiazide  12.5 mg Oral Daily  . insulin aspart  0-9 Units Subcutaneous TID WC  . levothyroxine  50 mcg Oral QAC breakfast  . metoprolol succinate  50 mg Oral Daily   Continuous Infusions: . sodium chloride 75 mL/hr at 10/15/16 0632     LOS: 0 days    Time spent: 30 min    Janece Canterbury, MD [facility] Pager [phone]  If 7PM-7AM, please contact night-coverage [email or web address] Password [email or web address] 10/15/2016, 12:46 PM

## 2016-10-16 DIAGNOSIS — R1033 Periumbilical pain: Secondary | ICD-10-CM | POA: Diagnosis not present

## 2016-10-16 DIAGNOSIS — E038 Other specified hypothyroidism: Secondary | ICD-10-CM

## 2016-10-16 DIAGNOSIS — H3530 Unspecified macular degeneration: Secondary | ICD-10-CM | POA: Diagnosis present

## 2016-10-16 DIAGNOSIS — I482 Chronic atrial fibrillation: Secondary | ICD-10-CM | POA: Diagnosis not present

## 2016-10-16 DIAGNOSIS — E1121 Type 2 diabetes mellitus with diabetic nephropathy: Secondary | ICD-10-CM | POA: Diagnosis not present

## 2016-10-16 DIAGNOSIS — K8050 Calculus of bile duct without cholangitis or cholecystitis without obstruction: Secondary | ICD-10-CM

## 2016-10-16 DIAGNOSIS — I5032 Chronic diastolic (congestive) heart failure: Secondary | ICD-10-CM

## 2016-10-16 DIAGNOSIS — R748 Abnormal levels of other serum enzymes: Secondary | ICD-10-CM | POA: Diagnosis not present

## 2016-10-16 DIAGNOSIS — I272 Pulmonary hypertension, unspecified: Secondary | ICD-10-CM | POA: Diagnosis present

## 2016-10-16 DIAGNOSIS — R791 Abnormal coagulation profile: Secondary | ICD-10-CM | POA: Diagnosis not present

## 2016-10-16 DIAGNOSIS — K5750 Diverticulosis of both small and large intestine without perforation or abscess without bleeding: Secondary | ICD-10-CM | POA: Diagnosis present

## 2016-10-16 DIAGNOSIS — Z7984 Long term (current) use of oral hypoglycemic drugs: Secondary | ICD-10-CM | POA: Diagnosis not present

## 2016-10-16 DIAGNOSIS — Z87891 Personal history of nicotine dependence: Secondary | ICD-10-CM | POA: Diagnosis not present

## 2016-10-16 DIAGNOSIS — E1122 Type 2 diabetes mellitus with diabetic chronic kidney disease: Secondary | ICD-10-CM | POA: Diagnosis present

## 2016-10-16 DIAGNOSIS — R109 Unspecified abdominal pain: Secondary | ICD-10-CM | POA: Diagnosis present

## 2016-10-16 DIAGNOSIS — E039 Hypothyroidism, unspecified: Secondary | ICD-10-CM | POA: Diagnosis not present

## 2016-10-16 DIAGNOSIS — I481 Persistent atrial fibrillation: Secondary | ICD-10-CM | POA: Diagnosis not present

## 2016-10-16 DIAGNOSIS — E871 Hypo-osmolality and hyponatremia: Secondary | ICD-10-CM | POA: Diagnosis not present

## 2016-10-16 DIAGNOSIS — D696 Thrombocytopenia, unspecified: Secondary | ICD-10-CM | POA: Diagnosis not present

## 2016-10-16 DIAGNOSIS — K8051 Calculus of bile duct without cholangitis or cholecystitis with obstruction: Secondary | ICD-10-CM | POA: Diagnosis not present

## 2016-10-16 DIAGNOSIS — I130 Hypertensive heart and chronic kidney disease with heart failure and stage 1 through stage 4 chronic kidney disease, or unspecified chronic kidney disease: Secondary | ICD-10-CM | POA: Diagnosis not present

## 2016-10-16 DIAGNOSIS — R1013 Epigastric pain: Secondary | ICD-10-CM | POA: Diagnosis not present

## 2016-10-16 DIAGNOSIS — N183 Chronic kidney disease, stage 3 (moderate): Secondary | ICD-10-CM | POA: Diagnosis not present

## 2016-10-16 DIAGNOSIS — J449 Chronic obstructive pulmonary disease, unspecified: Secondary | ICD-10-CM | POA: Diagnosis present

## 2016-10-16 DIAGNOSIS — Z8249 Family history of ischemic heart disease and other diseases of the circulatory system: Secondary | ICD-10-CM | POA: Diagnosis not present

## 2016-10-16 DIAGNOSIS — R17 Unspecified jaundice: Secondary | ICD-10-CM | POA: Diagnosis not present

## 2016-10-16 DIAGNOSIS — Z7901 Long term (current) use of anticoagulants: Secondary | ICD-10-CM | POA: Diagnosis not present

## 2016-10-16 DIAGNOSIS — Z9049 Acquired absence of other specified parts of digestive tract: Secondary | ICD-10-CM | POA: Diagnosis not present

## 2016-10-16 DIAGNOSIS — Z7982 Long term (current) use of aspirin: Secondary | ICD-10-CM | POA: Diagnosis not present

## 2016-10-16 DIAGNOSIS — R740 Nonspecific elevation of levels of transaminase and lactic acid dehydrogenase [LDH]: Secondary | ICD-10-CM | POA: Diagnosis not present

## 2016-10-16 DIAGNOSIS — Z96643 Presence of artificial hip joint, bilateral: Secondary | ICD-10-CM | POA: Diagnosis present

## 2016-10-16 DIAGNOSIS — E222 Syndrome of inappropriate secretion of antidiuretic hormone: Secondary | ICD-10-CM | POA: Diagnosis not present

## 2016-10-16 DIAGNOSIS — D689 Coagulation defect, unspecified: Secondary | ICD-10-CM | POA: Diagnosis present

## 2016-10-16 DIAGNOSIS — K831 Obstruction of bile duct: Secondary | ICD-10-CM | POA: Diagnosis not present

## 2016-10-16 LAB — COMPREHENSIVE METABOLIC PANEL
ALK PHOS: 205 U/L — AB (ref 38–126)
ALT: 343 U/L — AB (ref 17–63)
AST: 161 U/L — AB (ref 15–41)
Albumin: 3.3 g/dL — ABNORMAL LOW (ref 3.5–5.0)
Anion gap: 9 (ref 5–15)
BILIRUBIN TOTAL: 6.4 mg/dL — AB (ref 0.3–1.2)
BUN: 29 mg/dL — AB (ref 6–20)
CALCIUM: 8.3 mg/dL — AB (ref 8.9–10.3)
CO2: 28 mmol/L (ref 22–32)
CREATININE: 1.37 mg/dL — AB (ref 0.61–1.24)
Chloride: 91 mmol/L — ABNORMAL LOW (ref 101–111)
GFR calc Af Amer: 54 mL/min — ABNORMAL LOW (ref >60)
GFR, EST NON AFRICAN AMERICAN: 47 mL/min — AB (ref >60)
GLUCOSE: 134 mg/dL — AB (ref 65–99)
Potassium: 4.4 mmol/L (ref 3.5–5.1)
Sodium: 128 mmol/L — ABNORMAL LOW (ref 135–145)
TOTAL PROTEIN: 6.3 g/dL — AB (ref 6.5–8.1)

## 2016-10-16 LAB — GLUCOSE, CAPILLARY
GLUCOSE-CAPILLARY: 130 mg/dL — AB (ref 65–99)
GLUCOSE-CAPILLARY: 127 mg/dL — AB (ref 65–99)
Glucose-Capillary: 164 mg/dL — ABNORMAL HIGH (ref 65–99)
Glucose-Capillary: 170 mg/dL — ABNORMAL HIGH (ref 65–99)

## 2016-10-16 LAB — HEMOGLOBIN A1C
Hgb A1c MFr Bld: 6.3 % — ABNORMAL HIGH (ref 4.8–5.6)
Mean Plasma Glucose: 134 mg/dL

## 2016-10-16 LAB — CBC
HEMATOCRIT: 38.7 % — AB (ref 39.0–52.0)
HEMOGLOBIN: 13 g/dL (ref 13.0–17.0)
MCH: 30.6 pg (ref 26.0–34.0)
MCHC: 33.6 g/dL (ref 30.0–36.0)
MCV: 91.1 fL (ref 78.0–100.0)
Platelets: 129 10*3/uL — ABNORMAL LOW (ref 150–400)
RBC: 4.25 MIL/uL (ref 4.22–5.81)
RDW: 13.4 % (ref 11.5–15.5)
WBC: 9.2 10*3/uL (ref 4.0–10.5)

## 2016-10-16 LAB — HEPATITIS PANEL, ACUTE
HCV Ab: <0.1 s/co ratio (ref 0.0–0.9)
HEP A IGM: NEGATIVE
Hep B C IgM: NEGATIVE
Hepatitis B Surface Ag: NEGATIVE

## 2016-10-16 LAB — PROTIME-INR
INR: 4.72
PROTHROMBIN TIME: 46 s — AB (ref 11.4–15.2)

## 2016-10-16 LAB — OSMOLALITY, URINE: Osmolality, Ur: 679 mOsm/kg (ref 300–900)

## 2016-10-16 MED ORDER — VITAMIN K1 10 MG/ML IJ SOLN
5.0000 mg | Freq: Once | INTRAMUSCULAR | Status: AC
  Administered 2016-10-16: 5 mg via INTRAVENOUS
  Filled 2016-10-16: qty 0.5

## 2016-10-16 MED ORDER — METOPROLOL SUCCINATE ER 25 MG PO TB24
25.0000 mg | ORAL_TABLET | Freq: Every day | ORAL | Status: DC
  Administered 2016-10-16 – 2016-10-18 (×3): 25 mg via ORAL
  Filled 2016-10-16 (×2): qty 1

## 2016-10-16 MED ORDER — DOCUSATE SODIUM 100 MG PO CAPS
100.0000 mg | ORAL_CAPSULE | Freq: Two times a day (BID) | ORAL | Status: DC
  Administered 2016-10-16 – 2016-10-18 (×4): 100 mg via ORAL
  Filled 2016-10-16 (×4): qty 1

## 2016-10-16 MED ORDER — SENNA 8.6 MG PO TABS
2.0000 | ORAL_TABLET | Freq: Every day | ORAL | Status: DC
  Administered 2016-10-16 – 2016-10-18 (×4): 17.2 mg via ORAL
  Filled 2016-10-16 (×3): qty 2

## 2016-10-16 NOTE — Consult Note (Signed)
Referring Provider: No ref. provider found Primary Care Physician:  [name], MD Primary Gastroenterologist:  Dr.Kailey Esquilin  Reason for Consultation:  Elevated liver enzymes  HPI: Very pleasant 83 year old gentleman admitted to the hospital with upper abdominal pain and markedly abnormal liver function studies. Chronically anticoagulated on Coumadin. Patient was doing well until about 36 hours ago when he developed upper abdominal "tightness". He went to bed night before last developed rather severe pain. Came to the emergency department yesterday where he was evaluated and admitted. He has received 1 dose of morphine and states pain has resolved. He is tolerating a regular diet.  Admitting labs yesterday AST and ALT 14 and 449. Today AST and ALT 161 and 343. Bilirubin total has gone from 5.1-6.4. Minimally elevated lipase at 57. INR 4.7. Past GI history significant for cholecystitis, choledocholithiasis with biliary pancreatitis back in 2015. CT of the abdomen demonstrated a common bile duct stone follow-up MRI failed to confirm this. His LFTs improved and he ultimately underwent cholecystectomy down in [place]. I note that his LFTs improved dramatically subsequent to his hospitalization however I do not see that they were ever followed to complete normalcy.  He did not undergo an ERCP. Again, patient has done very well since 2015 until this acute illness. Hepatitis panel came back negative.  CT of the abdomen this admission demonstrates a mildly dilated biliary tree status post cholecystectomy. Pancreas appears normal. No evidence of common bile duct stone.    Past Medical History:  Diagnosis Date  . Atrial fibrillation ([place])    On coumadin  . Chronic kidney disease (CKD), stage III (moderate)   . Dysrhythmia    AFib  . Essential hypertension, benign   . History of cardiac catheterization    Details not certain - reportedly no interventions  . Hypothyroidism   . Macular  degeneration   . Pulmonary hypertension ([place]) 12/03/2013   PA pressure 56. Ejection fraction 55-60%  . Type 2 diabetes mellitus ([place])     Past Surgical History:  Procedure Laterality Date  . CARDIOVERSION N/A 12/10/2013   Procedure: CARDIOVERSION;  Surgeon: Herminio Commons, MD;  Location: [facility];  Service: Endoscopy;  Laterality: N/A;  . CATARACT EXTRACTION W/PHACO Left 11/13/2015   Procedure: CATARACT EXTRACTION PHACO AND INTRAOCULAR LENS PLACEMENT LEFT EYE CDE=7.76;  Surgeon: Williams Che, MD;  Location: [facility];  Service: Ophthalmology;  Laterality: Left;  . CHOLECYSTECTOMY    . COLONOSCOPY     Remote > 10 years ago  . REPLACEMENT TOTAL HIP W/  RESURFACING IMPLANTS Bilateral   . TEE WITHOUT CARDIOVERSION N/A 12/10/2013   Procedure: TRANSESOPHAGEAL ECHOCARDIOGRAM (TEE);  Surgeon: Herminio Commons, MD;  Location: [facility];  Service: Endoscopy;  Laterality: N/A;    Prior to Admission medications   Medication Sig Start Date End Date Taking? Authorizing Provider  aspirin EC 81 MG tablet Take 1 tablet (81 mg total) by mouth daily. 02/15/15  Yes Herminio Commons, MD  diltiazem (CARTIA XT) 120 MG 24 hr capsule Take 1 capsule (120 mg total) by mouth daily. 02/15/16  Yes Herminio Commons, MD  diphenhydrAMINE (BENADRYL) 25 MG tablet Take 25 mg by mouth every 6 (six) hours as needed. For sleep   Yes [provider]  doxazosin (CARDURA) 2 MG tablet Take 1 tablet (2 mg total) by mouth at bedtime. 04/24/15  Yes Herminio Commons, MD  levothyroxine (SYNTHROID, LEVOTHROID) 50 MCG tablet Take 50 mcg by mouth daily.   Yes [provider]  lisinopril-hydrochlorothiazide (PRINZIDE,ZESTORETIC) 20-12.5 MG tablet Take 1 tablet by mouth daily.   Yes [provider]  metFORMIN (GLUCOPHAGE) 500 MG tablet Take 500 mg by mouth 2 (two) times daily with a meal.    Yes [provider]  metoprolol succinate (TOPROL-XL) 50 MG 24 hr tablet Take 1 tablet (50 mg total) by  mouth daily. Take with or immediately following a meal. 04/24/15  Yes Herminio Commons, MD  polyvinyl alcohol (LIQUIFILM TEARS) 1.4 % ophthalmic solution Place 1 drop into both eyes as needed for dry eyes.   Yes [provider]  pravastatin (PRAVACHOL) 80 MG tablet Take 1 tablet by mouth daily. 09/09/16  Yes [provider]  warfarin (COUMADIN) 3 MG tablet Take 3 mg by mouth daily at 6 PM.    Yes [provider]    Current Facility-Administered Medications  Medication Dose Route Frequency Provider Last Rate Last Dose  . 0.9 %  sodium chloride infusion   Intravenous Continuous Oswald Hillock, MD 75 mL/hr at 10/16/16 [phone]    . aspirin EC tablet 81 mg  81 mg Oral Daily Oswald Hillock, MD   81 mg at 10/16/16 2706  . doxazosin (CARDURA) tablet 2 mg  2 mg Oral QHS Darrick Meigs, Marge Duncans, MD      . famotidine (PEPCID) tablet 20 mg  20 mg Oral Daily Oswald Hillock, MD   20 mg at 10/16/16 2376  . insulin aspart (novoLOG) injection 0-9 Units  0-9 Units Subcutaneous TID WC Oswald Hillock, MD   1 Units at 10/16/16 0800  . levothyroxine (SYNTHROID, LEVOTHROID) tablet 50 mcg  50 mcg Oral QAC breakfast Oswald Hillock, MD   50 mcg at 10/16/16 [phone]  . metoprolol succinate (TOPROL-XL) 24 hr tablet 25 mg  25 mg Oral Daily Tat, David, MD   25 mg at 10/16/16 0811  . ondansetron (ZOFRAN) tablet 4 mg  4 mg Oral Q6H PRN Oswald Hillock, MD       Or  . ondansetron [facility]) injection 4 mg  4 mg Intravenous Q6H PRN Oswald Hillock, MD        Allergies as of 10/14/2016  . (No Known Allergies)    Family History  Problem Relation Age of Onset  . Hypertension Mother   . Hypertension Father   . Colon cancer Neg Hx     Social History   Social History  . Marital status: Divorced    Spouse name: N/A  . Number of children: N/A  . Years of education: N/A   Occupational History  . Not on file.   Social History Main Topics  . Smoking status: Former Smoker    Packs/day: 1.00    Years: 37.00    Types:  Cigarettes    Start date: 11/01/1952    Quit date: 08/18/1978  . Smokeless tobacco: Never Used  . Alcohol use 0.0 oz/week     Comment: Occasional  . Drug use: No  . Sexual activity: Not Currently   Other Topics Concern  . Not on file   Social History Narrative  . No narrative on file    Review of Systems:  As in history of present illness   Physical Exam: Vital signs in last 24 hours: Temp:  [97.8 F (36.6 C)-98.6 F (37 C)] 98.6 F (37 C) (07/04 0528) Pulse Rate:  [69-83] 83 (07/04 0528) Resp:  [18-20] 20 (07/04 0528) BP: (102-110)/(49-65) 110/57 (07/04 0528) SpO2:  [98 %-100 %]  99 % (07/04 0528) Last BM Date: 10/14/16 General:   Alert,  pleasant and cooperative in NAD [place]allow in appearance. Abdomen: Nondistended. Positive bowel sounds soft nontender without appreciable mass or organomegaly.  Intake/Output from previous day: 07/03 0701 - 07/04 0700 In: 480 [P.O.:480] Out: 200 [Urine:200] Intake/Output this shift: No intake/output data recorded.  Lab Results:  Recent Labs  10/15/16 0025 10/16/16 0651  WBC 13.1* 9.2  HGB 14.7 13.0  HCT 42.6 38.7*  PLT 170 129*   BMET  Recent Labs  10/15/16 0025 10/16/16 0651  NA 131* 128*  K 3.7 4.4  CL 92* 91*  CO2 28 28  GLUCOSE 189* 134*  BUN 19 29*  CREATININE 1.22 1.37*  CALCIUM 9.4 8.3*   LFT  Recent Labs  10/15/16 1311 10/16/16 0651  PROT  --  6.3*  ALBUMIN  --  3.3*  AST  --  161*  ALT  --  343*  ALKPHOS  --  205*  BILITOT 5.5* 6.4*  BILIDIR 3.4*  --   IBILI 2.1*  --    PT/INR  Recent Labs  10/15/16 0025 10/16/16 0651  LABPROT 32.4* 46.0*  INR 3.07 4.72*   Hepatitis Panel  Recent Labs  10/15/16 0025  HEPBSAG Negative  HCVAB <0.1  HEPAIGM Negative  HEPBIGM Negative   C-Diff No results for input(s): CDIFFTOX in the last 72 hours.  Studies/Results: US Abdomen Complete  Result Date: 10/15/2016 CLINICAL DATA:  Abdominal pain for 1 day, elevated liver function tests, prior  cholecystectomy, diabetes and hypertension EXAM: ABDOMEN ULTRASOUND COMPLETE COMPARISON:  CT abdomen pelvis of 10/15/2016 FINDINGS: Gallbladder: The gallbladder has previously been resected. No pain is present over the right upper quadrant. Common bile duct: Diameter: The common bile duct measures 8 mm in diameter which is within normal limits post cholecystectomy. Liver: The liver is inhomogeneous and echogenic consistent with diffuse fatty infiltration. Slight prominence of intrahepatic ducts is noted. , as was present on recent CT of the abdomen pelvis. IVC: No abnormality visualized. Pancreas: The pancreas is not well seen due to bowel gas but appear normal on recent CT. Spleen: The spleen measures 7.9 cm. Right Kidney: Length: 11.0 cm.  No hydronephrosis is seen. Left Kidney: Length: 12.3 cm. A cyst emanates from the inferior left kidney anteriorly of 2.9 cm. Abdominal aorta: Much of the abdominal aorta is obscured by bowel gas. Other findings: None. IMPRESSION: 1. Echogenic liver parenchyma consistent with fatty infiltration. 2. Intrahepatic biliary ductal dilatation as was present on recent CT of the abdomen pelvis in this patient post cholecystectomy. 3. The pancreas and abdominal aorta are obscured by bowel gas. 4. No hydronephrosis. Electronically Signed   By: Ivar Drape M.D.   On: 10/15/2016 09:39   Ct Abdomen Pelvis W Contrast  Result Date: 10/15/2016 CLINICAL DATA:  Acute onset of severe generalized abdominal pain, nausea and cold chills. Initial encounter. EXAM: CT ABDOMEN AND PELVIS WITH CONTRAST TECHNIQUE: Multidetector CT imaging of the abdomen and pelvis was performed using the standard protocol following bolus administration of intravenous contrast. CONTRAST:  160mL ISOVUE-300 IOPAMIDOL (ISOVUE-300) INJECTION 61% COMPARISON:  CT of the abdomen and pelvis, and MRCP performed 12/01/2013 FINDINGS: Lower chest: Diffuse coronary artery calcifications are seen. The visualized lung bases are grossly  clear. Hepatobiliary: There is diffuse prominence of the intrahepatic biliary ducts, within normal limits status post cholecystectomy. Clips are noted at the gallbladder fossa. The liver is otherwise unremarkable. The common bile duct is prominent, but likely within normal limits status  post cholecystectomy. Pancreas: The pancreas is within normal limits. Spleen: The spleen is unremarkable in appearance. Adrenals/Urinary Tract: The adrenal glands are unremarkable in appearance. Nonspecific perinephric stranding is noted bilaterally. There is no evidence of hydronephrosis. No renal or ureteral stones are identified. A left renal cyst is noted. Stomach/Bowel: The stomach is unremarkable in appearance. The small bowel is within normal limits. The appendix is normal in caliber, without evidence of appendicitis. Scattered diverticulosis is noted along the sigmoid colon, without evidence of diverticulitis. Vascular/Lymphatic: Scattered calcification is seen along the abdominal aorta and its branches. The abdominal aorta is otherwise grossly unremarkable. The inferior vena cava is grossly unremarkable. No retroperitoneal lymphadenopathy is seen. No pelvic sidewall lymphadenopathy is identified. Reproductive: The bladder is mildly distended and grossly remarkable. The prostate remains normal in size. Other:  A small left inguinal hernia is noted, containing only fat. Musculoskeletal: No acute osseous abnormalities are identified. Vacuum phenomenon is noted at the upper lumbar spine. Bilateral hip arthroplasties appear grossly intact. The visualized musculature is unremarkable in appearance. IMPRESSION: 1. No acute abnormality seen to explain the patient's symptoms. 2. Scattered aortic atherosclerosis. 3. Diverticulosis along the sigmoid colon, without evidence of diverticulitis. 4. Diffuse coronary artery calcifications seen. 5. Left renal cyst noted. 6. Small left inguinal hernia, containing only fat. Electronically Signed    By: Garald Balding M.D.   On: 10/15/2016 02:06    Impression:   Very pleasant 83 year old gentleman admitted to the hospital with self-limiting upper abdominal pain. Notable bump in aminotransferases and bilirubin. He is now pain-free. Mild elevation in serum lipase. Normal-appearing pancreas on CT.  Presentation most consistent with biliary colic with transient extrahepatic biliary obstruction. Clinically, he does not have pancreatitis.  Scenario not consistent with drug-induced liver injury or ischemia. Hepatitis panel negative.  INR has ticked up -? Lab error +/-  component of malnutrition.  I doubt we are dealing with liver failure.  He most likely passed a common duct stone - unknown whether chronically indwelling or result of de novo stone formation in the bile duct.  Again, he is pain-free and tolerating a regular diet.  Recommendations:  Continue diet. Vitamin K 5 mg IV 1 dose today.  Repeat labs including INR tomorrow morning  Fully agree with pursuing an MRCP.  Further recommendations to follow.   Notice:  This dictation was prepared with Dragon dictation along with smaller phrase technology. Any transcriptional errors that result from this process are unintentional and may not be corrected upon review.

## 2016-10-16 NOTE — Progress Notes (Signed)
PROGRESS NOTE  STONY STEGMANN FXT:[id number] DOB: Aug 05, 1932 DOA: 10/14/2016 PCP: [name], MD  Brief History:  83 year old male with history of A. fib on Coumadin, hypertension, diabetes mellitus, hypothyroidism who presented to the hospital with a one-day history of worsening right upper quadrant abdominal pain similar to when he had previously had gallstones. He is status post laparoscopic cholecystectomy. She had nausea without vomiting.  He has chronic loose stools since cholecystectomy 1-1/2 years ago; however, the patient has not had a bowel movement in 3 days since admission. His pain improved with morphine and has not recurred. He was found to have a transaminitis with AST 410, ALT 449, lipase 57. His CT of the abdomen and pelvis was unremarkable and confirmed cholecystectomy. He had dilated common bile duct and intrahepatic ducts which were attributed to his previous cholecystectomy. The patient denies any new medications over-the-counter or prescription wise  Assessment/Plan: Abdominal pain with Hyperbilirubinemia/Transaminasemia -concerned that he had maybe an undetected choledocholith some obstructing sludge. His abdominal ultrasound confirmed what had already been seen on CT but did not offer any new explanation for his abdominal pain -10/16/2011 abdominal ultrasound--status post cholecystectomy, CBD 8 mm, fatty liver -hepatitis serologies--neg -?drug induced hepatitis from statin -holding statin -bilirubin continues to climb--mostly direct fraction -consult GI -acetaminophen level neg -order MRCP  Coagulopathy -no coumadin in 3 days with INR continuing to increase -concerned about hepatic failure -consult GI -INR 4.72 today--no signs of bleeding  Permanent Afib -rate controlled -holding coumadin as discussed -decrease metoprolol succinate due to soft BP -holding cardizem due soft BP -d/c lisinopril/HCTZ due to soft BP  Diabetes mellitus type 2 -10/15/16  A1C--6.3 -holding metformin -novolog sliding scale  Chronic diastolic CHF -clinically euvolemic -not on diuretics at home -daily weight  Hypothyroidism -continue synthroid  COPD -stable on RA -duobnebs -ambulated pt today--no desturation -d/c oxygen >60 pack year hx  Thrombocytopenia -has been chronic, intermittent -no hepatosplenomegaly on abd [place] -serum B12 -fibrinogen  CKD stage 3 -baseline creatinine 1.0-1.3     Disposition Plan:   Home when liver function/coagulopathy stabilizes Family Communication:  No  Family at bedside--Total time spent 35 minutes.  Greater than 50% spent face to face counseling and coordinating care.   Consultants: GI--Rourk   Code Status:  FULL   DVT Prophylaxis:  INR supratherapeutic   Procedures: As Listed in Progress Note Above  Antibiotics: None    Subjective: Patient denies fevers, chills, headache, chest pain, dyspnea, nausea, vomiting, diarrhea, abdominal pain, dysuria, hematuria, hematochezia, and melena.   Objective: Vitals:   10/15/16 1300 10/15/16 2035 10/15/16 2256 10/16/16 0528  BP: (!) 102/49  106/65 (!) 110/57  Pulse: 69   83  Resp: 18   20  Temp: 97.8 F (36.6 C)   98.6 F (37 C)  TempSrc: Oral   Oral  SpO2: 100% 98%  99%  Weight:      Height:        Intake/Output Summary (Last 24 hours) at 10/16/16 1154 Last data filed at 10/16/16 2992  Gross per 24 hour  Intake              240 ml  Output              200 ml  Net               40 ml   Weight change:  Exam:   General:  Pt is alert, follows commands appropriately, not  in acute distress  HEENT: No icterus, No thrush, No neck mass, /AT  Cardiovascular: RRR, S1/S2, no rubs, no gallops  Respiratory: CTA bilaterally, no wheezing, no crackles, no rhonchi  Abdomen: Soft/+BS, non tender, non distended, no guarding  Extremities: No edema, No lymphangitis, No petechiae, No rashes, no synovitis   Data Reviewed: I have personally reviewed  following labs and imaging studies Basic Metabolic Panel:  Recent Labs Lab 10/15/16 0025 10/16/16 0651  NA 131* 128*  K 3.7 4.4  CL 92* 91*  CO2 28 28  GLUCOSE 189* 134*  BUN 19 29*  CREATININE 1.22 1.37*  CALCIUM 9.4 8.3*   Liver Function Tests:  Recent Labs Lab 10/15/16 0025 10/15/16 1311 10/16/16 0651  AST 410*  --  161*  ALT 449*  --  343*  ALKPHOS 242*  --  205*  BILITOT 5.1* 5.5* 6.4*  PROT 7.4  --  6.3*  ALBUMIN 4.1  --  3.3*    Recent Labs Lab 10/15/16 0025  LIPASE 57*   No results for input(s): AMMONIA in the last 168 hours. Coagulation Profile:  Recent Labs Lab 10/15/16 0025 10/16/16 0651  INR 3.07 4.72*   CBC:  Recent Labs Lab 10/15/16 0025 10/16/16 0651  WBC 13.1* 9.2  NEUTROABS 11.2*  --   HGB 14.7 13.0  HCT 42.6 38.7*  MCV 89.1 91.1  PLT 170 129*   Cardiac Enzymes: No results for input(s): CKTOTAL, CKMB, CKMBINDEX, TROPONINI in the last 168 hours. BNP: Invalid input(s): POCBNP CBG:  Recent Labs Lab 10/15/16 0741 10/15/16 1103 10/15/16 1652 10/15/16 2257 10/16/16 0758  GLUCAP 175* 168* 127* 98 130*   HbA1C:  Recent Labs  10/15/16 0025  HGBA1C 6.3*   Urine analysis:    Component Value Date/Time   COLORURINE AMBER (A) 10/15/2016 0320   APPEARANCEUR CLEAR 10/15/2016 0320   LABSPEC >1.046 (H) 10/15/2016 0320   PHURINE 5.0 10/15/2016 0320   GLUCOSEU 50 (A) 10/15/2016 0320   HGBUR MODERATE (A) 10/15/2016 0320   BILIRUBINUR NEGATIVE 10/15/2016 0320   KETONESUR 5 (A) 10/15/2016 0320   PROTEINUR NEGATIVE 10/15/2016 0320   UROBILINOGEN 0.2 12/01/2013 0740   NITRITE NEGATIVE 10/15/2016 0320   LEUKOCYTESUR NEGATIVE 10/15/2016 0320   Sepsis Labs: @LABRCNTIP (procalcitonin:4,lacticidven:4) )No results found for this or any previous visit (from the past 240 hour(s)).   Scheduled Meds: . aspirin EC  81 mg Oral Daily  . doxazosin  2 mg Oral QHS  . famotidine  20 mg Oral Daily  . insulin aspart  0-9 Units Subcutaneous  TID WC  . levothyroxine  50 mcg Oral QAC breakfast  . metoprolol succinate  25 mg Oral Daily   Continuous Infusions: . sodium chloride 75 mL/hr at 10/16/16 4098    Procedures/Studies: US Abdomen Complete  Result Date: 10/15/2016 CLINICAL DATA:  Abdominal pain for 1 day, elevated liver function tests, prior cholecystectomy, diabetes and hypertension EXAM: ABDOMEN ULTRASOUND COMPLETE COMPARISON:  CT abdomen pelvis of 10/15/2016 FINDINGS: Gallbladder: The gallbladder has previously been resected. No pain is present over the right upper quadrant. Common bile duct: Diameter: The common bile duct measures 8 mm in diameter which is within normal limits post cholecystectomy. Liver: The liver is inhomogeneous and echogenic consistent with diffuse fatty infiltration. Slight prominence of intrahepatic ducts is noted. , as was present on recent CT of the abdomen pelvis. IVC: No abnormality visualized. Pancreas: The pancreas is not well seen due to bowel gas but appear normal on recent CT. Spleen: The spleen measures 7.9  cm. Right Kidney: Length: 11.0 cm.  No hydronephrosis is seen. Left Kidney: Length: 12.3 cm. A cyst emanates from the inferior left kidney anteriorly of 2.9 cm. Abdominal aorta: Much of the abdominal aorta is obscured by bowel gas. Other findings: None. IMPRESSION: 1. Echogenic liver parenchyma consistent with fatty infiltration. 2. Intrahepatic biliary ductal dilatation as was present on recent CT of the abdomen pelvis in this patient post cholecystectomy. 3. The pancreas and abdominal aorta are obscured by bowel gas. 4. No hydronephrosis. Electronically Signed   By: Ivar Drape M.D.   On: 10/15/2016 09:39   Ct Abdomen Pelvis W Contrast  Result Date: 10/15/2016 CLINICAL DATA:  Acute onset of severe generalized abdominal pain, nausea and cold chills. Initial encounter. EXAM: CT ABDOMEN AND PELVIS WITH CONTRAST TECHNIQUE: Multidetector CT imaging of the abdomen and pelvis was performed using the  standard protocol following bolus administration of intravenous contrast. CONTRAST:  163mL ISOVUE-300 IOPAMIDOL (ISOVUE-300) INJECTION 61% COMPARISON:  CT of the abdomen and pelvis, and MRCP performed 12/01/2013 FINDINGS: Lower chest: Diffuse coronary artery calcifications are seen. The visualized lung bases are grossly clear. Hepatobiliary: There is diffuse prominence of the intrahepatic biliary ducts, within normal limits status post cholecystectomy. Clips are noted at the gallbladder fossa. The liver is otherwise unremarkable. The common bile duct is prominent, but likely within normal limits status post cholecystectomy. Pancreas: The pancreas is within normal limits. Spleen: The spleen is unremarkable in appearance. Adrenals/Urinary Tract: The adrenal glands are unremarkable in appearance. Nonspecific perinephric stranding is noted bilaterally. There is no evidence of hydronephrosis. No renal or ureteral stones are identified. A left renal cyst is noted. Stomach/Bowel: The stomach is unremarkable in appearance. The small bowel is within normal limits. The appendix is normal in caliber, without evidence of appendicitis. Scattered diverticulosis is noted along the sigmoid colon, without evidence of diverticulitis. Vascular/Lymphatic: Scattered calcification is seen along the abdominal aorta and its branches. The abdominal aorta is otherwise grossly unremarkable. The inferior vena cava is grossly unremarkable. No retroperitoneal lymphadenopathy is seen. No pelvic sidewall lymphadenopathy is identified. Reproductive: The bladder is mildly distended and grossly remarkable. The prostate remains normal in size. Other:  A small left inguinal hernia is noted, containing only fat. Musculoskeletal: No acute osseous abnormalities are identified. Vacuum phenomenon is noted at the upper lumbar spine. Bilateral hip arthroplasties appear grossly intact. The visualized musculature is unremarkable in appearance. IMPRESSION: 1. No  acute abnormality seen to explain the patient's symptoms. 2. Scattered aortic atherosclerosis. 3. Diverticulosis along the sigmoid colon, without evidence of diverticulitis. 4. Diffuse coronary artery calcifications seen. 5. Left renal cyst noted. 6. Small left inguinal hernia, containing only fat. Electronically Signed   By: Garald Balding M.D.   On: 10/15/2016 02:06    Juan Olthoff, DO  [facility] Pager [phone]  If 7PM-7AM, please contact night-coverage [email or web address] Password [email or web address] 10/16/2016, 11:54 AM   LOS: 0 days

## 2016-10-16 NOTE — Progress Notes (Signed)
ANTICOAGULATION CONSULT NOTE - follow up  Pharmacy Consult for Coumadin Indication: atrial fibrillation   Patient Measurements: Height: 6' (182.9 cm) Weight: 213 lb 10 oz (96.9 kg) IBW/kg (Calculated) : 77.6  Vital Signs: Temp: 98.6 F (37 C) (07/04 0528) Temp Source: Oral (07/04 0528) BP: 110/57 (07/04 0528) Pulse Rate: 83 (07/04 0528)  Labs:  Recent Labs  10/15/16 0025 10/16/16 0651  HGB 14.7 13.0  HCT 42.6 38.7*  PLT 170 129*  LABPROT 32.4* 46.0*  INR 3.07 4.72*  CREATININE 1.22 1.37*   Estimated Creatinine Clearance: 51 mL/min (A) (by C-G formula based on SCr of 1.37 mg/dL (H)).  Medical History: Past Medical History:  Diagnosis Date  . Atrial fibrillation ([place])    On coumadin  . Chronic kidney disease (CKD), stage III (moderate)   . Dysrhythmia    AFib  . Essential hypertension, benign   . History of cardiac catheterization    Details not certain - reportedly no interventions  . Hypothyroidism   . Macular degeneration   . Pulmonary hypertension ([place]) 12/03/2013   PA pressure 56. Ejection fraction 55-60%  . Type 2 diabetes mellitus ([facility])    Medications:  Prescriptions Prior to Admission  Medication Sig Dispense Refill Last Dose  . aspirin EC 81 MG tablet Take 1 tablet (81 mg total) by mouth daily.   10/14/2016 at Unknown time  . diltiazem (CARTIA XT) 120 MG 24 hr capsule Take 1 capsule (120 mg total) by mouth daily. 90 capsule 3 10/14/2016 at Unknown time  . diphenhydrAMINE (BENADRYL) 25 MG tablet Take 25 mg by mouth every 6 (six) hours as needed. For sleep   10/14/2016 at Unknown time  . doxazosin (CARDURA) 2 MG tablet Take 1 tablet (2 mg total) by mouth at bedtime. 90 tablet 3 10/14/2016 at Unknown time  . levothyroxine (SYNTHROID, LEVOTHROID) 50 MCG tablet Take 50 mcg by mouth daily.   10/14/2016 at Unknown time  . lisinopril-hydrochlorothiazide (PRINZIDE,ZESTORETIC) 20-12.5 MG tablet Take 1 tablet by mouth daily.   10/14/2016 at Unknown time  . metFORMIN  (GLUCOPHAGE) 500 MG tablet Take 500 mg by mouth 2 (two) times daily with a meal.    10/14/2016 at Unknown time  . metoprolol succinate (TOPROL-XL) 50 MG 24 hr tablet Take 1 tablet (50 mg total) by mouth daily. Take with or immediately following a meal. 90 tablet 3 10/14/2016 at 0730  . polyvinyl alcohol (LIQUIFILM TEARS) 1.4 % ophthalmic solution Place 1 drop into both eyes as needed for dry eyes.   Past Week at Unknown time  . pravastatin (PRAVACHOL) 80 MG tablet Take 1 tablet by mouth daily.   10/14/2016 at Unknown time  . warfarin (COUMADIN) 3 MG tablet Take 3 mg by mouth daily at 6 PM.    10/14/2016 at 2300   Assessment: 83 y.o. male, with history of atrial fibrillation on anticoagulation with Coumadin, hypertension, diabetes mellitus, hypothyroidism who came to hospital with worsening abdominal pain of one-day duration. Pharmacy asked to dose Coumadin. INR > 4 today despite no Coumadin yesterday.   Goal of Therapy:  INR 2-3 Monitor platelets by anticoagulation protocol: Yes   Plan:  No coumadin today, allow INR to trend down PT-INR daily Monitor for S/S of bleeding  Hart Robinsons, PharmD Clinical Pharmacist Pager:  [phone] 10/16/2016   10/16/2016,9:07 AM

## 2016-10-17 ENCOUNTER — Inpatient Hospital Stay (HOSPITAL_COMMUNITY): Payer: Medicare HMO | Admitting: Anesthesiology

## 2016-10-17 ENCOUNTER — Inpatient Hospital Stay (HOSPITAL_COMMUNITY): Payer: Medicare HMO

## 2016-10-17 ENCOUNTER — Encounter (HOSPITAL_COMMUNITY)
Admission: EM | Disposition: A | Discharge status: Home / Self Care | Payer: Self-pay | Source: Home / Self Care | Attending: Internal Medicine

## 2016-10-17 ENCOUNTER — Encounter (HOSPITAL_COMMUNITY): Payer: Self-pay

## 2016-10-17 DIAGNOSIS — K831 Obstruction of bile duct: Secondary | ICD-10-CM

## 2016-10-17 DIAGNOSIS — R748 Abnormal levels of other serum enzymes: Secondary | ICD-10-CM

## 2016-10-17 HISTORY — PX: SPHINCTEROTOMY: SHX5279

## 2016-10-17 HISTORY — PX: REMOVAL OF STONES: SHX5545

## 2016-10-17 HISTORY — PX: ERCP: SHX5425

## 2016-10-17 LAB — CBC WITH DIFFERENTIAL/PLATELET
BASOS PCT: 0 %
Basophils Absolute: 0 10*3/uL (ref 0.0–0.1)
Eosinophils Absolute: 0 10*3/uL (ref 0.0–0.7)
Eosinophils Relative: 0 %
HCT: 37.9 % — ABNORMAL LOW (ref 39.0–52.0)
Hemoglobin: 12.9 g/dL — ABNORMAL LOW (ref 13.0–17.0)
Lymphocytes Relative: 11 %
Lymphs Abs: 0.8 10*3/uL (ref 0.7–4.0)
MCH: 30.2 pg (ref 26.0–34.0)
MCHC: 34 g/dL (ref 30.0–36.0)
MCV: 88.8 fL (ref 78.0–100.0)
MONO ABS: 0.7 10*3/uL (ref 0.1–1.0)
MONOS PCT: 10 %
NEUTROS ABS: 5.5 10*3/uL (ref 1.7–7.7)
Neutrophils Relative %: 79 %
Platelets: 165 10*3/uL (ref 150–400)
RBC: 4.27 MIL/uL (ref 4.22–5.81)
RDW: 13 % (ref 11.5–15.5)
WBC: 7 10*3/uL (ref 4.0–10.5)

## 2016-10-17 LAB — COMPREHENSIVE METABOLIC PANEL
ALBUMIN: 3.1 g/dL — AB (ref 3.5–5.0)
ALT: 244 U/L — ABNORMAL HIGH (ref 17–63)
ANION GAP: 10 (ref 5–15)
AST: 96 U/L — ABNORMAL HIGH (ref 15–41)
Alkaline Phosphatase: 240 U/L — ABNORMAL HIGH (ref 38–126)
BILIRUBIN TOTAL: 6.8 mg/dL — AB (ref 0.3–1.2)
BUN: 21 mg/dL — ABNORMAL HIGH (ref 6–20)
CO2: 27 mmol/L (ref 22–32)
Calcium: 8.3 mg/dL — ABNORMAL LOW (ref 8.9–10.3)
Chloride: 90 mmol/L — ABNORMAL LOW (ref 101–111)
Creatinine, Ser: 0.93 mg/dL (ref 0.61–1.24)
Glucose, Bld: 151 mg/dL — ABNORMAL HIGH (ref 65–99)
POTASSIUM: 3.7 mmol/L (ref 3.5–5.1)
Sodium: 127 mmol/L — ABNORMAL LOW (ref 135–145)
TOTAL PROTEIN: 6.2 g/dL — AB (ref 6.5–8.1)

## 2016-10-17 LAB — GLUCOSE, CAPILLARY
GLUCOSE-CAPILLARY: 145 mg/dL — AB (ref 65–99)
GLUCOSE-CAPILLARY: 127 mg/dL — AB (ref 65–99)
Glucose-Capillary: 126 mg/dL — ABNORMAL HIGH (ref 65–99)
Glucose-Capillary: 170 mg/dL — ABNORMAL HIGH (ref 65–99)

## 2016-10-17 LAB — PROTIME-INR
INR: 1.51
Prothrombin Time: 18.4 seconds — ABNORMAL HIGH (ref 11.4–15.2)

## 2016-10-17 LAB — VITAMIN B12: VITAMIN B 12: 316 pg/mL (ref 180–914)

## 2016-10-17 LAB — FIBRINOGEN: Fibrinogen: 696 mg/dL — ABNORMAL HIGH (ref 210–475)

## 2016-10-17 LAB — OSMOLALITY: OSMOLALITY: 276 mosm/kg (ref 275–295)

## 2016-10-17 SURGERY — ERCP, WITH INTERVENTION IF INDICATED
Anesthesia: General | Site: Esophagus

## 2016-10-17 MED ORDER — IOPAMIDOL (ISOVUE-300) INJECTION 61%
INTRAVENOUS | Status: AC
  Filled 2016-10-17: qty 100

## 2016-10-17 MED ORDER — LACTATED RINGERS IV SOLN
INTRAVENOUS | Status: DC
  Administered 2016-10-17: 11:00:00 via INTRAVENOUS

## 2016-10-17 MED ORDER — ROCURONIUM BROMIDE 50 MG/5ML IV SOLN
INTRAVENOUS | Status: AC
  Filled 2016-10-17: qty 1

## 2016-10-17 MED ORDER — ZOLPIDEM TARTRATE 5 MG PO TABS
5.0000 mg | ORAL_TABLET | Freq: Every evening | ORAL | Status: DC | PRN
  Administered 2016-10-17: 5 mg via ORAL
  Filled 2016-10-17: qty 1

## 2016-10-17 MED ORDER — MIDAZOLAM HCL 2 MG/2ML IJ SOLN
INTRAMUSCULAR | Status: AC
  Filled 2016-10-17: qty 2

## 2016-10-17 MED ORDER — SODIUM CHLORIDE 0.9 % IV SOLN
1.5000 g | Freq: Once | INTRAVENOUS | Status: AC
  Administered 2016-10-17: 1.5 g via INTRAVENOUS
  Filled 2016-10-17: qty 1.5

## 2016-10-17 MED ORDER — PHENYLEPHRINE HCL 10 MG/ML IJ SOLN
INTRAMUSCULAR | Status: DC | PRN
  Administered 2016-10-17: 40 ug via INTRAVENOUS
  Administered 2016-10-17: 80 ug via INTRAVENOUS
  Administered 2016-10-17 (×3): 40 ug via INTRAVENOUS
  Administered 2016-10-17: 80 ug via INTRAVENOUS

## 2016-10-17 MED ORDER — NEOSTIGMINE METHYLSULFATE 10 MG/10ML IV SOLN
INTRAVENOUS | Status: DC | PRN
  Administered 2016-10-17: 4 mg via INTRAVENOUS

## 2016-10-17 MED ORDER — LIDOCAINE HCL (CARDIAC) 10 MG/ML IV SOLN
INTRAVENOUS | Status: DC | PRN
  Administered 2016-10-17: 30 mg via INTRAVENOUS

## 2016-10-17 MED ORDER — SODIUM CHLORIDE 0.9 % IV SOLN: INTRAVENOUS | Status: DC

## 2016-10-17 MED ORDER — GLYCOPYRROLATE 0.2 MG/ML IJ SOLN
INTRAMUSCULAR | Status: AC
  Filled 2016-10-17: qty 3

## 2016-10-17 MED ORDER — PHENYLEPHRINE 40 MCG/ML (10ML) SYRINGE FOR IV PUSH (FOR BLOOD PRESSURE SUPPORT)
PREFILLED_SYRINGE | INTRAVENOUS | Status: AC
  Filled 2016-10-17: qty 10

## 2016-10-17 MED ORDER — FENTANYL CITRATE (PF) 100 MCG/2ML IJ SOLN
INTRAMUSCULAR | Status: DC | PRN
  Administered 2016-10-17: 25 ug via INTRAVENOUS

## 2016-10-17 MED ORDER — FENTANYL CITRATE (PF) 100 MCG/2ML IJ SOLN: 25.0000 ug | INTRAMUSCULAR | Status: DC | PRN

## 2016-10-17 MED ORDER — LIDOCAINE HCL (PF) 1 % IJ SOLN
INTRAMUSCULAR | Status: AC
  Filled 2016-10-17: qty 5

## 2016-10-17 MED ORDER — GADOBENATE DIMEGLUMINE 529 MG/ML IV SOLN
20.0000 mL | Freq: Once | INTRAVENOUS | Status: AC | PRN
  Administered 2016-10-17: 20 mL via INTRAVENOUS

## 2016-10-17 MED ORDER — FENTANYL CITRATE (PF) 100 MCG/2ML IJ SOLN
INTRAMUSCULAR | Status: AC
  Filled 2016-10-17: qty 2

## 2016-10-17 MED ORDER — GLUCAGON HCL RDNA (DIAGNOSTIC) 1 MG IJ SOLR
INTRAMUSCULAR | Status: AC
  Filled 2016-10-17: qty 2

## 2016-10-17 MED ORDER — ROCURONIUM BROMIDE 100 MG/10ML IV SOLN
INTRAVENOUS | Status: DC | PRN
  Administered 2016-10-17: 10 mg via INTRAVENOUS
  Administered 2016-10-17: 30 mg via INTRAVENOUS

## 2016-10-17 MED ORDER — GLYCOPYRROLATE 0.2 MG/ML IJ SOLN
INTRAMUSCULAR | Status: DC | PRN
  Administered 2016-10-17: .6 mg via INTRAVENOUS

## 2016-10-17 MED ORDER — ASPIRIN EC 81 MG PO TBEC
81.0000 mg | DELAYED_RELEASE_TABLET | Freq: Every day | ORAL | Status: DC
  Administered 2016-10-18: 81 mg via ORAL
  Filled 2016-10-17: qty 1

## 2016-10-17 MED ORDER — PROPOFOL 10 MG/ML IV BOLUS
INTRAVENOUS | Status: DC | PRN
  Administered 2016-10-17: 20 mg via INTRAVENOUS
  Administered 2016-10-17: 80 mg via INTRAVENOUS

## 2016-10-17 MED ORDER — MIDAZOLAM HCL 2 MG/2ML IJ SOLN
1.0000 mg | INTRAMUSCULAR | Status: AC
Start: 2016-10-17 — End: 2016-10-17
  Administered 2016-10-17: 2 mg via INTRAVENOUS

## 2016-10-17 NOTE — Progress Notes (Signed)
PROGRESS NOTE  Brett [name] NIO:[id number] DOB: 1932-04-27 DOA: 10/14/2016 PCP: [name], MD  Brief History:  83 year old male with history of A. fib on Coumadin, hypertension, diabetes mellitus, hypothyroidism who presented to the hospital with a one-day history of worsening right upper quadrant abdominal pain similar to when he had previously had gallstones. He is status post laparoscopic cholecystectomy. She had nausea without vomiting. He has chronic loose stools since cholecystectomy 1-1/2 years ago; however, the patient has not had a bowel movement in 3 days since admission. His pain improved with morphine and has not recurred. He was found to have a transaminitis with AST 410, ALT 449, lipase 57. His CT of the abdomen and pelvis was unremarkable and confirmed cholecystectomy. He had dilated common bile duct and intrahepatic ducts which were attributed to his previous cholecystectomy. The patient denies any new medications over-the-counter or prescription wise  Assessment/Plan: Symptomatic Choledocholithiasis -Presented with abdominal pain with Hyperbilirubinemia/Transaminasemia  -10/16/2011 abdominal ultrasound--status post cholecystectomy, CBD 8 mm, fatty liver -MRCP--CBD 24mm with three mid/distal CBD stones -hepatitis serologies--neg -?drug induced hepatitis from statin -holding statin -bilirubin continues to climb--mostly direct fraction -appreciate GI  -acetaminophen level neg -10/17/16--ERCP--choledocholithiasis, status post biliary sphincterotomy, biliary sphincterotomy balloon dilation and stone extraction. -am hepatic panel  Coagulopathy -no coumadin in 3 days with INR continuing to increase -?related poor po intake x 4-5 days -consulted GI -INR 4.72 on 7/4--vitamin K given-->INR 1.51 -restart coumadin 7/8 per GI  Permanent Afib -rate controlled -holding coumadin as discussed--restart on 7/8 -decrease metoprolol succinate due to soft BP -holding  cardizem due soft BP -d/c lisinopril/HCTZ due to soft BP  Hyponatremia -likely due to poor solute intake--poor po intake x 4-5 days and a degree of SIADH due to acute medical illness -am BMP -serum osm 276 -urine osm 679 -TSH -am cortisol - am lipid panel  Diabetes mellitus type 2 -10/15/16 A1C--6.3 -holding metformin -novolog sliding scale  Chronic diastolic CHF -clinically euvolemic -not on diuretics at home -daily weight  Hypothyroidism -continue synthroid  COPD -stable on RA -duobnebs -ambulated pt 7/4--no desturation -d/c oxygen >60 pack year hx  Thrombocytopenia -has been chronic, intermittent -no hepatosplenomegaly on abd [place] -serum B12--316 -fibrinogen--696  CKD stage 3 -baseline creatinine 1.0-1.3     Disposition Plan:   Home 7/6 or 14 Family Communication:  significant other updated at bedside 7/5 --Total time spent 35 minutes.  Greater than 50% spent face to face counseling and coordinating care.   Consultants: GI--Rourk   Code Status:  FULL   DVT Prophylaxis:  INR supratherapeutic   Procedures: As Listed in Progress Note Above  Antibiotics: None    Subjective: Patient denies fevers, chills, headache, chest pain, dyspnea, nausea, vomiting, diarrhea, abdominal pain, dysuria, hematuria, hematochezia, and melena.   Objective: Vitals:   10/17/16 1249 10/17/16 1300 10/17/16 1315 10/17/16 1425  BP: 123/85 115/86 (!) 112/95 137/87  Pulse: (!) 112 90 71 94  Resp: 20 19 (!) 22 20  Temp: 97.6 F (36.4 C)   98.1 F (36.7 C)  TempSrc:      SpO2: 98% 99% 92% 95%  Weight:      Height:        Intake/Output Summary (Last 24 hours) at 10/17/16 1658 Last data filed at 10/17/16 1300  Gross per 24 hour  Intake              940 ml  Output  0 ml  Net              940 ml   Weight change:  Exam:   General:  Pt is alert, follows commands appropriately, not in acute distress  HEENT: No icterus, No thrush, No  neck mass, [place]/AT  Cardiovascular: RRR, S1/S2, no rubs, no gallops  Respiratory: CTA bilaterally, no wheezing, no crackles, no rhonchi  Abdomen: Soft/+BS, non tender, non distended, no guarding  Extremities: No edema, No lymphangitis, No petechiae, No rashes, no synovitis   Data Reviewed: I have personally reviewed following labs and imaging studies Basic Metabolic Panel:  Recent Labs Lab 10/15/16 0025 10/16/16 0651 10/17/16 0624  NA 131* 128* 127*  K 3.7 4.4 3.7  CL 92* 91* 90*  CO2 28 28 27   GLUCOSE 189* 134* 151*  BUN 19 29* 21*  CREATININE 1.22 1.37* 0.93  CALCIUM 9.4 8.3* 8.3*   Liver Function Tests:  Recent Labs Lab 10/15/16 0025 10/15/16 1311 10/16/16 0651 10/17/16 0624  AST 410*  --  161* 96*  ALT 449*  --  343* 244*  ALKPHOS 242*  --  205* 240*  BILITOT 5.1* 5.5* 6.4* 6.8*  PROT 7.4  --  6.3* 6.2*  ALBUMIN 4.1  --  3.3* 3.1*    Recent Labs Lab 10/15/16 0025  LIPASE 57*   No results for input(s): AMMONIA in the last 168 hours. Coagulation Profile:  Recent Labs Lab 10/15/16 0025 10/16/16 0651 10/17/16 0624  INR 3.07 4.72* 1.51   CBC:  Recent Labs Lab 10/15/16 0025 10/16/16 0651 10/17/16 0624  WBC 13.1* 9.2 7.0  NEUTROABS 11.2*  --  5.5  HGB 14.7 13.0 12.9*  HCT 42.6 38.7* 37.9*  MCV 89.1 91.1 88.8  PLT 170 129* 165   Cardiac Enzymes: No results for input(s): CKTOTAL, CKMB, CKMBINDEX, TROPONINI in the last 168 hours. BNP: Invalid input(s): POCBNP CBG:  Recent Labs Lab 10/16/16 1655 10/16/16 2130 10/17/16 1014 10/17/16 1258 10/17/16 1652  GLUCAP 164* 170* 145* 126* 127*   HbA1C:  Recent Labs  10/15/16 0025  HGBA1C 6.3*   Urine analysis:    Component Value Date/Time   COLORURINE AMBER (A) 10/15/2016 0320   APPEARANCEUR CLEAR 10/15/2016 0320   LABSPEC >1.046 (H) 10/15/2016 0320   PHURINE 5.0 10/15/2016 0320   GLUCOSEU 50 (A) 10/15/2016 0320   HGBUR MODERATE (A) 10/15/2016 0320   BILIRUBINUR NEGATIVE 10/15/2016  0320   KETONESUR 5 (A) 10/15/2016 0320   PROTEINUR NEGATIVE 10/15/2016 0320   UROBILINOGEN 0.2 12/01/2013 0740   NITRITE NEGATIVE 10/15/2016 0320   LEUKOCYTESUR NEGATIVE 10/15/2016 0320   Sepsis Labs: @LABRCNTIP (procalcitonin:4,lacticidven:4) )No results found for this or any previous visit (from the past 240 hour(s)).   Scheduled Meds: . [START ON 10/18/2016] aspirin EC  81 mg Oral Daily  . docusate sodium  100 mg Oral BID  . doxazosin  2 mg Oral QHS  . famotidine  20 mg Oral Daily  . glucagon (human recombinant)      . insulin aspart  0-9 Units Subcutaneous TID WC  . iopamidol      . levothyroxine  50 mcg Oral QAC breakfast  . metoprolol succinate  25 mg Oral Daily  . senna  2 tablet Oral Daily   Continuous Infusions:  Procedures/Studies: US Abdomen Complete  Result Date: 10/15/2016 CLINICAL DATA:  Abdominal pain for 1 day, elevated liver function tests, prior cholecystectomy, diabetes and hypertension EXAM: ABDOMEN ULTRASOUND COMPLETE COMPARISON:  CT abdomen pelvis of 10/15/2016 FINDINGS: Gallbladder:  The gallbladder has previously been resected. No pain is present over the right upper quadrant. Common bile duct: Diameter: The common bile duct measures 8 mm in diameter which is within normal limits post cholecystectomy. Liver: The liver is inhomogeneous and echogenic consistent with diffuse fatty infiltration. Slight prominence of intrahepatic ducts is noted. , as was present on recent CT of the abdomen pelvis. IVC: No abnormality visualized. Pancreas: The pancreas is not well seen due to bowel gas but appear normal on recent CT. Spleen: The spleen measures 7.9 cm. Right Kidney: Length: 11.0 cm.  No hydronephrosis is seen. Left Kidney: Length: 12.3 cm. A cyst emanates from the inferior left kidney anteriorly of 2.9 cm. Abdominal aorta: Much of the abdominal aorta is obscured by bowel gas. Other findings: None. IMPRESSION: 1. Echogenic liver parenchyma consistent with fatty infiltration.  2. Intrahepatic biliary ductal dilatation as was present on recent CT of the abdomen pelvis in this patient post cholecystectomy. 3. The pancreas and abdominal aorta are obscured by bowel gas. 4. No hydronephrosis. Electronically Signed   By: Ivar Drape M.D.   On: 10/15/2016 09:39   Ct Abdomen Pelvis W Contrast  Result Date: 10/15/2016 CLINICAL DATA:  Acute onset of severe generalized abdominal pain, nausea and cold chills. Initial encounter. EXAM: CT ABDOMEN AND PELVIS WITH CONTRAST TECHNIQUE: Multidetector CT imaging of the abdomen and pelvis was performed using the standard protocol following bolus administration of intravenous contrast. CONTRAST:  11mL ISOVUE-300 IOPAMIDOL (ISOVUE-300) INJECTION 61% COMPARISON:  CT of the abdomen and pelvis, and MRCP performed 12/01/2013 FINDINGS: Lower chest: Diffuse coronary artery calcifications are seen. The visualized lung bases are grossly clear. Hepatobiliary: There is diffuse prominence of the intrahepatic biliary ducts, within normal limits status post cholecystectomy. Clips are noted at the gallbladder fossa. The liver is otherwise unremarkable. The common bile duct is prominent, but likely within normal limits status post cholecystectomy. Pancreas: The pancreas is within normal limits. Spleen: The spleen is unremarkable in appearance. Adrenals/Urinary Tract: The adrenal glands are unremarkable in appearance. Nonspecific perinephric stranding is noted bilaterally. There is no evidence of hydronephrosis. No renal or ureteral stones are identified. A left renal cyst is noted. Stomach/Bowel: The stomach is unremarkable in appearance. The small bowel is within normal limits. The appendix is normal in caliber, without evidence of appendicitis. Scattered diverticulosis is noted along the sigmoid colon, without evidence of diverticulitis. Vascular/Lymphatic: Scattered calcification is seen along the abdominal aorta and its branches. The abdominal aorta is otherwise  grossly unremarkable. The inferior vena cava is grossly unremarkable. No retroperitoneal lymphadenopathy is seen. No pelvic sidewall lymphadenopathy is identified. Reproductive: The bladder is mildly distended and grossly remarkable. The prostate remains normal in size. Other:  A small left inguinal hernia is noted, containing only fat. Musculoskeletal: No acute osseous abnormalities are identified. Vacuum phenomenon is noted at the upper lumbar spine. Bilateral hip arthroplasties appear grossly intact. The visualized musculature is unremarkable in appearance. IMPRESSION: 1. No acute abnormality seen to explain the patient's symptoms. 2. Scattered aortic atherosclerosis. 3. Diverticulosis along the sigmoid colon, without evidence of diverticulitis. 4. Diffuse coronary artery calcifications seen. 5. Left renal cyst noted. 6. Small left inguinal hernia, containing only fat. Electronically Signed   By: Garald Balding M.D.   On: 10/15/2016 02:06   Mr 3d Recon At Scanner  Result Date: 10/17/2016 CLINICAL DATA:  Right upper quadrant pain, prior cholecystectomy, hyperbilirubinemia EXAM: MRI ABDOMEN WITHOUT AND WITH CONTRAST (INCLUDING MRCP) TECHNIQUE: Multiplanar multisequence MR imaging of the abdomen was  performed both before and after the administration of intravenous contrast. Heavily T2-weighted images of the biliary and pancreatic ducts were obtained, and three-dimensional MRCP images were rendered by post processing. CONTRAST:  21mL MULTIHANCE GADOBENATE DIMEGLUMINE 529 MG/ML IV SOLN COMPARISON:  12/01/2013 FINDINGS: Motion degraded images. Lower chest: Lung bases are clear. Hepatobiliary: 10 mm cyst in segment 8 of the liver (series 4/image 19). No suspicious/enhancing hepatic lesions. No hepatic steatosis. Status post cholecystectomy.  No intrahepatic duct dilatation. Dilated common duct, measuring up to 13 mm. Three mid/distal CBD stones measuring up to 15 mm (series 5/images 6-7). Pancreas:  Within normal  limits. Spleen:  Within normal limits. Adrenals/Urinary Tract:  Adrenal glands are within normal limits. 2.8 cm anterior left lower pole renal cyst. Right kidney is within normal limits. No hydronephrosis. Stomach/Bowel: Stomach is within normal limits. Visualized bowel is unremarkable. Vascular/Lymphatic:  No evidence of abdominal aortic aneurysm. No suspicious abdominal lymphadenopathy. Other:  No abdominal ascites. Musculoskeletal: No focal osseous lesions. IMPRESSION: Motion degraded images. Status post cholecystectomy. Dilated CBD, measuring 13 mm. Choledocholithiasis with three mid/distal CBD stones measuring up to 15 mm. ERCP is suggested. Additional ancillary findings as above. Electronically Signed   By: Julian Hy M.D.   On: 10/17/2016 08:47   Dg Ercp Biliary & Pancreatic Ducts  Result Date: 10/17/2016 CLINICAL DATA:  83 year old male with a history of choledocholithiasis EXAM: ERCP TECHNIQUE: Multiple spot images obtained with the fluoroscopic device and submitted for interpretation post-procedure. FLUOROSCOPY TIME:  Fluoroscopy Time:  2 minutes 48 seconds COMPARISON:  MR 10/17/2016, CT 10/15/2016 FINDINGS: Multiple limited intraoperative fluoroscopic spot images during ERCP. Initial image demonstrates endoscope projecting over the upper abdomen. Surgical clips in place of prior cholecystectomy. Subsequently there is cannulation of the ampulla and retrograde infusion of contrast. Multiple rounded filling defects present within the extrahepatic biliary system. There is then deployment of a balloon basket. No stent on the final image. IMPRESSION: Limited images during ERCP demonstrates treatment for choledocholithiasis. Please refer to the dictated operative report for full details of intraoperative findings and procedure. Electronically Signed   By: Corrie Mckusick D.O.   On: 10/17/2016 13:26   Mr Abdomen Mrcp W Wo Contast  Result Date: 10/17/2016 CLINICAL DATA:  Right upper quadrant pain, prior  cholecystectomy, hyperbilirubinemia EXAM: MRI ABDOMEN WITHOUT AND WITH CONTRAST (INCLUDING MRCP) TECHNIQUE: Multiplanar multisequence MR imaging of the abdomen was performed both before and after the administration of intravenous contrast. Heavily T2-weighted images of the biliary and pancreatic ducts were obtained, and three-dimensional MRCP images were rendered by post processing. CONTRAST:  48mL MULTIHANCE GADOBENATE DIMEGLUMINE 529 MG/ML IV SOLN COMPARISON:  12/01/2013 FINDINGS: Motion degraded images. Lower chest: Lung bases are clear. Hepatobiliary: 10 mm cyst in segment 8 of the liver (series 4/image 19). No suspicious/enhancing hepatic lesions. No hepatic steatosis. Status post cholecystectomy.  No intrahepatic duct dilatation. Dilated common duct, measuring up to 13 mm. Three mid/distal CBD stones measuring up to 15 mm (series 5/images 6-7). Pancreas:  Within normal limits. Spleen:  Within normal limits. Adrenals/Urinary Tract:  Adrenal glands are within normal limits. 2.8 cm anterior left lower pole renal cyst. Right kidney is within normal limits. No hydronephrosis. Stomach/Bowel: Stomach is within normal limits. Visualized bowel is unremarkable. Vascular/Lymphatic:  No evidence of abdominal aortic aneurysm. No suspicious abdominal lymphadenopathy. Other:  No abdominal ascites. Musculoskeletal: No focal osseous lesions. IMPRESSION: Motion degraded images. Status post cholecystectomy. Dilated CBD, measuring 13 mm. Choledocholithiasis with three mid/distal CBD stones measuring up to 15 mm. ERCP  is suggested. Additional ancillary findings as above. Electronically Signed   By: Julian Hy M.D.   On: 10/17/2016 08:47    Taji Barretto, DO  [facility] Pager [phone]  If 7PM-7AM, please contact night-coverage [email or web address] Password [email or web address] 10/17/2016, 4:58 PM   LOS: 1 day

## 2016-10-17 NOTE — Anesthesia Procedure Notes (Signed)
Procedure Name: Intubation Date/Time: 10/17/2016 11:46 AM Performed by: Vista Deck Pre-anesthesia Checklist: Patient identified, Patient being monitored, Timeout performed, Emergency Drugs available and Suction available Patient Re-evaluated:Patient Re-evaluated prior to inductionOxygen Delivery Method: Circle System Utilized Preoxygenation: Pre-oxygenation with 100% oxygen Intubation Type: IV induction Ventilation: Mask ventilation without difficulty Laryngoscope Size: Mac and 3 Grade View: Grade II Tube type: Oral Tube size: 7.0 mm Number of attempts: 1 Airway Equipment and Method: stylet Placement Confirmation: ETT inserted through vocal cords under direct vision,  positive ETCO2 and breath sounds checked- equal and bilateral Secured at: 21 cm Tube secured with: Tape Dental Injury: Teeth and Oropharynx as per pre-operative assessment

## 2016-10-17 NOTE — Progress Notes (Signed)
Subjective:  Patient just returned from MRI. He had coffee with sips of milk. No solid foods today. Poor appetite. BM normal earlier today. No vomiting. No significant abdominal pain.   Objective: Vital signs in last 24 hours: Temp:  [97.6 F (36.4 C)-97.9 F (36.6 C)] 97.9 F (36.6 C) (07/05 0528) Pulse Rate:  [80-110] 89 (07/05 0528) Resp:  [18-20] 18 (07/05 0528) BP: (127-145)/(59-80) 132/64 (07/05 0528) SpO2:  [90 %-96 %] 96 % (07/05 0528) Last BM Date: 10/17/16 General:   Alert,  Well-developed, well-nourished, pleasant and cooperative in NAD Head:  Normocephalic and atraumatic. Eyes:  Sclera clear, + icterus.  Chest: CTA bilaterally without rales, rhonchi, crackles.    Heart:  Regular rate and rhythm; no murmurs, clicks, rubs,  or gallops. Abdomen:  Soft, nontender and nondistended.   Normal bowel sounds, without guarding, and without rebound.   Extremities:  Without clubbing, deformity or edema. Neurologic:  Alert and  oriented x4;  grossly normal neurologically. Skin:  Intact without significant lesions or rashes. Psych:  Alert and cooperative. Normal mood and affect.  Intake/Output from previous day: 07/04 0701 - 07/05 0700 In: 2748.8 [P.O.:240; I.V.:2458.8; IV Piggyback:50] Out: 500 [Urine:500] Intake/Output this shift: No intake/output data recorded.  Lab Results: CBC  Recent Labs  10/15/16 0025 10/16/16 0651 10/17/16 0624  WBC 13.1* 9.2 7.0  HGB 14.7 13.0 12.9*  HCT 42.6 38.7* 37.9*  MCV 89.1 91.1 88.8  PLT 170 129* 165   BMET  Recent Labs  10/15/16 0025 10/16/16 0651 10/17/16 0624  NA 131* 128* 127*  K 3.7 4.4 3.7  CL 92* 91* 90*  CO2 28 28 27   GLUCOSE 189* 134* 151*  BUN 19 29* 21*  CREATININE 1.22 1.37* 0.93  CALCIUM 9.4 8.3* 8.3*   LFTs  Recent Labs  10/15/16 0025 10/15/16 1311 10/16/16 0651 10/17/16 0624  BILITOT 5.1* 5.5* 6.4* 6.8*  BILIDIR  --  3.4*  --   --   IBILI  --  2.1*  --   --   ALKPHOS 242*  --  205* 240*  AST 410*   --  161* 96*  ALT 449*  --  343* 244*  PROT 7.4  --  6.3* 6.2*  ALBUMIN 4.1  --  3.3* 3.1*    Recent Labs  10/15/16 0025  LIPASE 57*   PT/INR  Recent Labs  10/15/16 0025 10/16/16 0651 10/17/16 0624  LABPROT 32.4* 46.0* 18.4*  INR 3.07 4.72* 1.51      Imaging Studies: US Abdomen Complete  Result Date: 10/15/2016 CLINICAL DATA:  Abdominal pain for 1 day, elevated liver function tests, prior cholecystectomy, diabetes and hypertension EXAM: ABDOMEN ULTRASOUND COMPLETE COMPARISON:  CT abdomen pelvis of 10/15/2016 FINDINGS: Gallbladder: The gallbladder has previously been resected. No pain is present over the right upper quadrant. Common bile duct: Diameter: The common bile duct measures 8 mm in diameter which is within normal limits post cholecystectomy. Liver: The liver is inhomogeneous and echogenic consistent with diffuse fatty infiltration. Slight prominence of intrahepatic ducts is noted. , as was present on recent CT of the abdomen pelvis. IVC: No abnormality visualized. Pancreas: The pancreas is not well seen due to bowel gas but appear normal on recent CT. Spleen: The spleen measures 7.9 cm. Right Kidney: Length: 11.0 cm.  No hydronephrosis is seen. Left Kidney: Length: 12.3 cm. A cyst emanates from the inferior left kidney anteriorly of 2.9 cm. Abdominal aorta: Much of the abdominal aorta is obscured by bowel gas. Other findings:  None. IMPRESSION: 1. Echogenic liver parenchyma consistent with fatty infiltration. 2. Intrahepatic biliary ductal dilatation as was present on recent CT of the abdomen pelvis in this patient post cholecystectomy. 3. The pancreas and abdominal aorta are obscured by bowel gas. 4. No hydronephrosis. Electronically Signed   By: Ivar Drape M.D.   On: 10/15/2016 09:39   Ct Abdomen Pelvis W Contrast  Result Date: 10/15/2016 CLINICAL DATA:  Acute onset of severe generalized abdominal pain, nausea and cold chills. Initial encounter. EXAM: CT ABDOMEN AND PELVIS WITH  CONTRAST TECHNIQUE: Multidetector CT imaging of the abdomen and pelvis was performed using the standard protocol following bolus administration of intravenous contrast. CONTRAST:  173mL ISOVUE-300 IOPAMIDOL (ISOVUE-300) INJECTION 61% COMPARISON:  CT of the abdomen and pelvis, and MRCP performed 12/01/2013 FINDINGS: Lower chest: Diffuse coronary artery calcifications are seen. The visualized lung bases are grossly clear. Hepatobiliary: There is diffuse prominence of the intrahepatic biliary ducts, within normal limits status post cholecystectomy. Clips are noted at the gallbladder fossa. The liver is otherwise unremarkable. The common bile duct is prominent, but likely within normal limits status post cholecystectomy. Pancreas: The pancreas is within normal limits. Spleen: The spleen is unremarkable in appearance. Adrenals/Urinary Tract: The adrenal glands are unremarkable in appearance. Nonspecific perinephric stranding is noted bilaterally. There is no evidence of hydronephrosis. No renal or ureteral stones are identified. A left renal cyst is noted. Stomach/Bowel: The stomach is unremarkable in appearance. The small bowel is within normal limits. The appendix is normal in caliber, without evidence of appendicitis. Scattered diverticulosis is noted along the sigmoid colon, without evidence of diverticulitis. Vascular/Lymphatic: Scattered calcification is seen along the abdominal aorta and its branches. The abdominal aorta is otherwise grossly unremarkable. The inferior vena cava is grossly unremarkable. No retroperitoneal lymphadenopathy is seen. No pelvic sidewall lymphadenopathy is identified. Reproductive: The bladder is mildly distended and grossly remarkable. The prostate remains normal in size. Other:  A small left inguinal hernia is noted, containing only fat. Musculoskeletal: No acute osseous abnormalities are identified. Vacuum phenomenon is noted at the upper lumbar spine. Bilateral hip arthroplasties  appear grossly intact. The visualized musculature is unremarkable in appearance. IMPRESSION: 1. No acute abnormality seen to explain the patient's symptoms. 2. Scattered aortic atherosclerosis. 3. Diverticulosis along the sigmoid colon, without evidence of diverticulitis. 4. Diffuse coronary artery calcifications seen. 5. Left renal cyst noted. 6. Small left inguinal hernia, containing only fat. Electronically Signed   By: Garald Balding M.D.   On: 10/15/2016 02:06   Mr 3d Recon At Scanner  Result Date: 10/17/2016 CLINICAL DATA:  Right upper quadrant pain, prior cholecystectomy, hyperbilirubinemia EXAM: MRI ABDOMEN WITHOUT AND WITH CONTRAST (INCLUDING MRCP) TECHNIQUE: Multiplanar multisequence MR imaging of the abdomen was performed both before and after the administration of intravenous contrast. Heavily T2-weighted images of the biliary and pancreatic ducts were obtained, and three-dimensional MRCP images were rendered by post processing. CONTRAST:  79mL MULTIHANCE GADOBENATE DIMEGLUMINE 529 MG/ML IV SOLN COMPARISON:  12/01/2013 FINDINGS: Motion degraded images. Lower chest: Lung bases are clear. Hepatobiliary: 10 mm cyst in segment 8 of the liver (series 4/image 19). No suspicious/enhancing hepatic lesions. No hepatic steatosis. Status post cholecystectomy.  No intrahepatic duct dilatation. Dilated common duct, measuring up to 13 mm. Three mid/distal CBD stones measuring up to 15 mm (series 5/images 6-7). Pancreas:  Within normal limits. Spleen:  Within normal limits. Adrenals/Urinary Tract:  Adrenal glands are within normal limits. 2.8 cm anterior left lower pole renal cyst. Right kidney is within normal  limits. No hydronephrosis. Stomach/Bowel: Stomach is within normal limits. Visualized bowel is unremarkable. Vascular/Lymphatic:  No evidence of abdominal aortic aneurysm. No suspicious abdominal lymphadenopathy. Other:  No abdominal ascites. Musculoskeletal: No focal osseous lesions. IMPRESSION: Motion  degraded images. Status post cholecystectomy. Dilated CBD, measuring 13 mm. Choledocholithiasis with three mid/distal CBD stones measuring up to 15 mm. ERCP is suggested. Additional ancillary findings as above. Electronically Signed   By: Julian Hy M.D.   On: 10/17/2016 08:47   Mr Abdomen Mrcp Moise Boring Contast  Result Date: 10/17/2016 CLINICAL DATA:  Right upper quadrant pain, prior cholecystectomy, hyperbilirubinemia EXAM: MRI ABDOMEN WITHOUT AND WITH CONTRAST (INCLUDING MRCP) TECHNIQUE: Multiplanar multisequence MR imaging of the abdomen was performed both before and after the administration of intravenous contrast. Heavily T2-weighted images of the biliary and pancreatic ducts were obtained, and three-dimensional MRCP images were rendered by post processing. CONTRAST:  79mL MULTIHANCE GADOBENATE DIMEGLUMINE 529 MG/ML IV SOLN COMPARISON:  12/01/2013 FINDINGS: Motion degraded images. Lower chest: Lung bases are clear. Hepatobiliary: 10 mm cyst in segment 8 of the liver (series 4/image 19). No suspicious/enhancing hepatic lesions. No hepatic steatosis. Status post cholecystectomy.  No intrahepatic duct dilatation. Dilated common duct, measuring up to 13 mm. Three mid/distal CBD stones measuring up to 15 mm (series 5/images 6-7). Pancreas:  Within normal limits. Spleen:  Within normal limits. Adrenals/Urinary Tract:  Adrenal glands are within normal limits. 2.8 cm anterior left lower pole renal cyst. Right kidney is within normal limits. No hydronephrosis. Stomach/Bowel: Stomach is within normal limits. Visualized bowel is unremarkable. Vascular/Lymphatic:  No evidence of abdominal aortic aneurysm. No suspicious abdominal lymphadenopathy. Other:  No abdominal ascites. Musculoskeletal: No focal osseous lesions. IMPRESSION: Motion degraded images. Status post cholecystectomy. Dilated CBD, measuring 13 mm. Choledocholithiasis with three mid/distal CBD stones measuring up to 15 mm. ERCP is suggested. Additional  ancillary findings as above. Electronically Signed   By: Julian Hy M.D.   On: 10/17/2016 08:47  [2 weeks]   Assessment: Very pleasant 82 year old gentleman admitted to the hospital with self-limiting upper abdominal pain. Notable bump in aminotransferases and bilirubin.He is now pain-free. Mild elevation in serum lipase. Normal-appearing pancreas on CT. MRCP today c/w 3 CBD stones.   INR normal after vitamin K. Coumadin being managed by pharmacy. Patient has not been receiving lovenox. ASA 81mg  daily.  Plan: 1. NPO. 2. ERCP later today. The risks, benefits, limitations, alternatives, and imponderables have been reviewed with the patient. I specifically discussed a 1 in 10 chance of pancreatitis, reaction to medications, bleeding, perforation and the possibility of a failed ERCP. Potential for sphincterotomy and stent placement also reviewed. Questions have been answered. All parties agreeable. 3. I spoke to pharmacy, Mickel Baas, and advised of plans for ERCP and need to hold off on anticoagulation today. Further instructions to follow procedure.  4. Patient's nurse aware of plans for procedure and to give no ASA today or anticoagulation.    Laureen Ochs. Bernarda Caffey [facility] [phone] 7/5/20189:37 AM     LOS: 1 day

## 2016-10-17 NOTE — Transfer of Care (Signed)
Immediate Anesthesia Transfer of Care Note  Patient: Brett [name]  Procedure(s) Performed: Procedure(s): ENDOSCOPIC RETROGRADE CHOLANGIOPANCREATOGRAPHY (ERCP) (N/A) BILLARY SPHINCTEROTOMY with ballon dilation REMOVAL OF STONES with balloon  Patient Location: PACU  Anesthesia Type:General  Level of Consciousness: awake and alert   Airway & Oxygen Therapy: Patient Spontanous Breathing and non-rebreather face mask  Post-op Assessment: Report given to RN and Post -op Vital signs reviewed and stable  Post vital signs: Reviewed and stable  Last Vitals:  Vitals:   10/17/16 0528 10/17/16 1107  BP: 132/64 139/82  Pulse: 89 (!) 112  Resp: 18 18  Temp: 36.6 C (!) 36.4 C    Last Pain:  Vitals:   10/17/16 1107  TempSrc: Oral  PainSc:          Complications: No apparent anesthesia complications

## 2016-10-17 NOTE — Procedures (Signed)
ERCP with sphincterotomy and balloon extraction of multiple common duct stones. Patient tolerated well.  Clear liquid diet. Check LFTs tomorrow morning. See full dictated note.

## 2016-10-17 NOTE — Anesthesia Postprocedure Evaluation (Signed)
Anesthesia Post Note  Patient: Brett [name]  Procedure(s) Performed: Procedure(s) (LRB): ENDOSCOPIC RETROGRADE CHOLANGIOPANCREATOGRAPHY (ERCP) (N/A) BILLARY SPHINCTEROTOMY with ballon dilation REMOVAL OF STONES with balloon  Patient location during evaluation: PACU Anesthesia Type: General Level of consciousness: awake and alert Pain management: pain level controlled Vital Signs Assessment: post-procedure vital signs reviewed and stable Respiratory status: spontaneous breathing Cardiovascular status: stable Postop Assessment: no signs of nausea or vomiting Anesthetic complications: no     Last Vitals:  Vitals:   10/17/16 1300 10/17/16 1315  BP: 115/86 (!) 112/95  Pulse: 90 71  Resp: 19 (!) 22  Temp:      Last Pain:  Vitals:   10/17/16 1315  TempSrc:   PainSc: 0-No pain                 Gianfranco Araki

## 2016-10-17 NOTE — Progress Notes (Signed)
ANTICOAGULATION CONSULT NOTE - follow up  Pharmacy Consult for Coumadin (currently on hold for procedure) Indication: atrial fibrillation  Patient Measurements: Height: 6' (182.9 cm) Weight: 213 lb 10 oz (96.9 kg) IBW/kg (Calculated) : 77.6  Vital Signs: Temp: 97.9 F (36.6 C) (07/05 0528) Temp Source: Oral (07/05 0528) BP: 132/64 (07/05 0528) Pulse Rate: 89 (07/05 0528)  Labs:  Recent Labs  10/15/16 0025 10/16/16 0651 10/17/16 0624  HGB 14.7 13.0 12.9*  HCT 42.6 38.7* 37.9*  PLT 170 129* 165  LABPROT 32.4* 46.0* 18.4*  INR 3.07 4.72* 1.51  CREATININE 1.22 1.37* 0.93   Estimated Creatinine Clearance: 75.2 mL/min (by C-G formula based on SCr of 0.93 mg/dL).  Medical History: Past Medical History:  Diagnosis Date  . Atrial fibrillation ([place])    On coumadin  . Chronic kidney disease (CKD), stage III (moderate)   . Dysrhythmia    AFib  . Essential hypertension, benign   . History of cardiac catheterization    Details not certain - reportedly no interventions  . Hypothyroidism   . Macular degeneration   . Pulmonary hypertension ([place]) 12/03/2013   PA pressure 56. Ejection fraction 55-60%  . Type 2 diabetes mellitus ([facility])    Medications:  Prescriptions Prior to Admission  Medication Sig Dispense Refill Last Dose  . aspirin EC 81 MG tablet Take 1 tablet (81 mg total) by mouth daily.   10/14/2016 at Unknown time  . diltiazem (CARTIA XT) 120 MG 24 hr capsule Take 1 capsule (120 mg total) by mouth daily. 90 capsule 3 10/14/2016 at Unknown time  . diphenhydrAMINE (BENADRYL) 25 MG tablet Take 25 mg by mouth every 6 (six) hours as needed. For sleep   10/14/2016 at Unknown time  . doxazosin (CARDURA) 2 MG tablet Take 1 tablet (2 mg total) by mouth at bedtime. 90 tablet 3 10/14/2016 at Unknown time  . levothyroxine (SYNTHROID, LEVOTHROID) 50 MCG tablet Take 50 mcg by mouth daily.   10/14/2016 at Unknown time  . lisinopril-hydrochlorothiazide (PRINZIDE,ZESTORETIC) 20-12.5 MG tablet Take  1 tablet by mouth daily.   10/14/2016 at Unknown time  . metFORMIN (GLUCOPHAGE) 500 MG tablet Take 500 mg by mouth 2 (two) times daily with a meal.    10/14/2016 at Unknown time  . metoprolol succinate (TOPROL-XL) 50 MG 24 hr tablet Take 1 tablet (50 mg total) by mouth daily. Take with or immediately following a meal. 90 tablet 3 10/14/2016 at 0730  . polyvinyl alcohol (LIQUIFILM TEARS) 1.4 % ophthalmic solution Place 1 drop into both eyes as needed for dry eyes.   Past Week at Unknown time  . pravastatin (PRAVACHOL) 80 MG tablet Take 1 tablet by mouth daily.   10/14/2016 at Unknown time  . warfarin (COUMADIN) 3 MG tablet Take 3 mg by mouth daily at 6 PM.    10/14/2016 at 2300   Assessment: 83 y.o. male, with history of atrial fibrillation on anticoagulation with Coumadin, hypertension, diabetes mellitus, hypothyroidism who came to hospital with worsening abdominal pain of one-day duration.  Goal of Therapy:  INR 2-3 Monitor platelets by anticoagulation protocol: Yes   Plan:  No coumadin today due to planned procedure F/U plans to resume Coumadin when OK with MD PT-INR daily Monitor for S/S of bleeding  Hart Robinsons, PharmD Clinical Pharmacist Pager:  [phone] 10/17/2016   10/17/2016,9:43 AM

## 2016-10-17 NOTE — Progress Notes (Signed)
Necklace in pink denture cup at bedside with pt.

## 2016-10-17 NOTE — Plan of Care (Signed)
Problem: Safety: Goal: Ability to remain free from injury will improve Outcome: Progressing Patient verbalized understanding of calling for assistance when needed and demonstrated correct use of call bell.  Pt progressing towards goal.

## 2016-10-17 NOTE — Anesthesia Preprocedure Evaluation (Addendum)
Anesthesia Evaluation  Patient identified by MRN, date of birth, ID band Patient awake    Reviewed: Allergy & Precautions, H&P , NPO status , Patient's Chart, lab work & pertinent test results, reviewed documented beta blocker date and time   Airway Mallampati: III  TM Distance: >3 FB Neck ROM: Full    Dental  (+) Edentulous Upper, Poor Dentition   Pulmonary shortness of breath, former smoker,   Mild wheezing   + wheezing      Cardiovascular hypertension, Pt. on home beta blockers and Pt. on medications + DOE  + dysrhythmias Atrial Fibrillation  Rhythm:Irregular Rate:Tachycardia     Neuro/Psych    GI/Hepatic negative GI ROS, CBD stones   Endo/Other  diabetes, Well Controlled, Type 2, Oral Hypoglycemic AgentsHypothyroidism   Renal/GU Renal InsufficiencyRenal disease     Musculoskeletal  (+) Arthritis , Bilateral hip replacement   Abdominal   Peds  Hematology   Anesthesia Other Findings   Reproductive/Obstetrics                             Anesthesia Physical Anesthesia Plan  ASA: III  Anesthesia Plan: General   Post-op Pain Management:    Induction: Intravenous  PONV Risk Score and Plan:   Airway Management Planned: Oral ETT  Additional Equipment:   Intra-op Plan:   Post-operative Plan: Extubation in OR  Informed Consent: I have reviewed the patients History and Physical, chart, labs and discussed the procedure including the risks, benefits and alternatives for the proposed anesthesia with the patient or authorized representative who has indicated his/her understanding and acceptance.     Plan Discussed with:   Anesthesia Plan Comments:         Anesthesia Quick Evaluation

## 2016-10-17 NOTE — Op Note (Signed)
Brett [name], Brett [name]               ACCOUNT NO.:  [id number]  MEDICAL RECORD NO.:  [id number]  LOCATION:  Y865                          FACILITY:  [facility]  PHYSICIAN:  R. Garfield Cornea, MD FACP FACGDATE OF BIRTH:  Jan 31, 1933  DATE OF PROCEDURE:  10/17/2016 DATE OF DISCHARGE:                              OPERATIVE REPORT   PROCEDURES:  Endoscopic retrograde cholangiopancreatography with biliary sphincterotomy, biliary sphincterotomy balloon dilation, and balloon stone extraction.  INDICATION:  The patient is a pleasant 83 year old gentleman presented to the hospital with biliary colic and markedly elevated bilirubin and transaminases.  MRCP demonstrated dilated bile duct with multiple common duct stones.  ERCP now being done.  Risks, benefits, limitations, alternatives, imponderables have been discussed at length.  Questions answered.  The patient is agreeable.  The patient chronically anticoagulated.  The patient was given vitamin K yesterday.  His INR is normal.  Today.  PROCEDURE NOTE:  General endotracheal anesthesia was induced by Dr. Duwayne [name] and associates.  INSTRUMENT:  Pentax video chip system.  Unasyn 1.5 g IV given at the onset of the procedure.  FINDINGS:  Cursory examination of the distal esophagus and stomach revealed no abnormalities.  Pylorus was patent, easily traversed. Examination of bulb and second portion revealed a large duodenal diverticulum with the ampulla of Vater identified on the lip of the inferior aspect of the diverticulum.  The scope was pulled back to the short position, 55 cm from the incisors.  Scout film was taken. Utilizing the 36 Autotome the ampulla was approached; using guidewire  Palpation, deep biliary cannulation was fairly easily achieved. Cholangiogram performed which revealed a dilated common  biliary tree with multiple distal filling defects.  No stricture was appreciated. The safety wire was advanced deep into the biliary tree.   The sphincterotome was pulled back across the ampullary orifice and at the 12 o'clock position, the sphincterotomy was performed with the Erbe Unit approximately 1 cm sphincterotomy was performed.  With this maneuver, couple small stone fragments spontaneously were delivered through the ampullary orifice.  The stone most proximally was measured approximately 14 mm on MRCP.  I railed a CRE balloon to the ampullary orifice under fluoroscopic control and advanced into the distal bile duct.  This was inflated stepwise to 14 mm and held for a minute and taken down.  This opened up the ampullary orifice even further without bleeding or other complication.  Subsequently, utilizing an extractor balloon railed over the safety wire, multiple sweeps of the bile duct were performed with recovery of multiple mixed cholesterol and pigment appearing stones.  At the conclusion of procedure all filling defects seen were removed and there was excellent, almost instantaneous, drainage of the biliary tree. The patient tolerated the procedure well.  The pancreatic duct was not manipulated /injected.  Total fluoroscopy time 2.8 minutes.  IMPRESSION:  Duodenal diverticulum, choledocholithiasis, status post biliary sphincterotomy, biliary sphincterotomy balloon dilation and stone extraction.  RECOMMENDATIONS: 1. Clear liquid diet.  He will advance diet over the next 24 hours     depending on clinical course. 2. Hepatic profile tomorrow morning. 3. Would resume Coumadin on October 20, 2016.     Brett Habermann, MD  Brett [name]     RMR/MEDQ  D:  10/17/2016  T:  10/17/2016  Job:  [id number]  cc:   Brett Hughs, MD Fax: [phone]  R. Garfield Cornea, MD FACP Brett [name] [address] [place] [address]  Brett [name]

## 2016-10-18 ENCOUNTER — Telehealth: Payer: Self-pay | Admitting: Nurse Practitioner

## 2016-10-18 ENCOUNTER — Encounter (HOSPITAL_COMMUNITY): Payer: Self-pay | Admitting: Internal Medicine

## 2016-10-18 DIAGNOSIS — D696 Thrombocytopenia, unspecified: Secondary | ICD-10-CM

## 2016-10-18 DIAGNOSIS — R1033 Periumbilical pain: Secondary | ICD-10-CM | POA: Diagnosis not present

## 2016-10-18 DIAGNOSIS — R748 Abnormal levels of other serum enzymes: Secondary | ICD-10-CM | POA: Diagnosis not present

## 2016-10-18 DIAGNOSIS — I5032 Chronic diastolic (congestive) heart failure: Secondary | ICD-10-CM | POA: Diagnosis not present

## 2016-10-18 DIAGNOSIS — D689 Coagulation defect, unspecified: Secondary | ICD-10-CM | POA: Diagnosis not present

## 2016-10-18 DIAGNOSIS — R1013 Epigastric pain: Secondary | ICD-10-CM

## 2016-10-18 DIAGNOSIS — K8050 Calculus of bile duct without cholangitis or cholecystitis without obstruction: Secondary | ICD-10-CM | POA: Diagnosis not present

## 2016-10-18 DIAGNOSIS — R740 Nonspecific elevation of levels of transaminase and lactic acid dehydrogenase [LDH]: Secondary | ICD-10-CM | POA: Diagnosis not present

## 2016-10-18 DIAGNOSIS — E1121 Type 2 diabetes mellitus with diabetic nephropathy: Secondary | ICD-10-CM | POA: Diagnosis not present

## 2016-10-18 DIAGNOSIS — R17 Unspecified jaundice: Secondary | ICD-10-CM

## 2016-10-18 DIAGNOSIS — E038 Other specified hypothyroidism: Secondary | ICD-10-CM | POA: Diagnosis not present

## 2016-10-18 DIAGNOSIS — E871 Hypo-osmolality and hyponatremia: Secondary | ICD-10-CM | POA: Diagnosis not present

## 2016-10-18 DIAGNOSIS — N183 Chronic kidney disease, stage 3 (moderate): Secondary | ICD-10-CM | POA: Diagnosis not present

## 2016-10-18 DIAGNOSIS — I482 Chronic atrial fibrillation: Secondary | ICD-10-CM | POA: Diagnosis not present

## 2016-10-18 LAB — BASIC METABOLIC PANEL
Anion gap: 7 (ref 5–15)
BUN: 15 mg/dL (ref 6–20)
CHLORIDE: 92 mmol/L — AB (ref 101–111)
CO2: 31 mmol/L (ref 22–32)
Calcium: 8.6 mg/dL — ABNORMAL LOW (ref 8.9–10.3)
Creatinine, Ser: 1.03 mg/dL (ref 0.61–1.24)
GFR calc non Af Amer: >60 mL/min (ref >60)
Glucose, Bld: 159 mg/dL — ABNORMAL HIGH (ref 65–99)
POTASSIUM: 3.8 mmol/L (ref 3.5–5.1)
Sodium: 130 mmol/L — ABNORMAL LOW (ref 135–145)

## 2016-10-18 LAB — GLUCOSE, CAPILLARY
GLUCOSE-CAPILLARY: 159 mg/dL — AB (ref 65–99)
Glucose-Capillary: 174 mg/dL — ABNORMAL HIGH (ref 65–99)

## 2016-10-18 LAB — LIPID PANEL
CHOL/HDL RATIO: 3.5 ratio
Cholesterol: 158 mg/dL (ref 0–200)
HDL: 45 mg/dL (ref >40)
LDL CALC: 91 mg/dL (ref 0–99)
TRIGLYCERIDES: 108 mg/dL (ref <150)
VLDL: 22 mg/dL (ref 0–40)

## 2016-10-18 LAB — HEPATIC FUNCTION PANEL
ALK PHOS: 271 U/L — AB (ref 38–126)
ALT: 203 U/L — AB (ref 17–63)
AST: 77 U/L — ABNORMAL HIGH (ref 15–41)
Albumin: 3 g/dL — ABNORMAL LOW (ref 3.5–5.0)
BILIRUBIN DIRECT: 2.9 mg/dL — AB (ref 0.1–0.5)
BILIRUBIN INDIRECT: 2.3 mg/dL — AB (ref 0.3–0.9)
Total Bilirubin: 5.2 mg/dL — ABNORMAL HIGH (ref 0.3–1.2)
Total Protein: 6.1 g/dL — ABNORMAL LOW (ref 6.5–8.1)

## 2016-10-18 LAB — CORTISOL-AM, BLOOD: CORTISOL - AM: 27.3 ug/dL — AB (ref 6.7–22.6)

## 2016-10-18 LAB — PROTIME-INR
INR: 1.43
Prothrombin Time: 17.6 seconds — ABNORMAL HIGH (ref 11.4–15.2)

## 2016-10-18 LAB — TSH: TSH: 2.053 u[IU]/mL (ref 0.350–4.500)

## 2016-10-18 MED ORDER — ALBUTEROL SULFATE (2.5 MG/3ML) 0.083% IN NEBU: 2.5000 mg | INHALATION_SOLUTION | RESPIRATORY_TRACT | Status: DC | PRN

## 2016-10-18 MED ORDER — WARFARIN SODIUM 3 MG PO TABS: 3.0000 mg | ORAL_TABLET | Freq: Every day | ORAL | 0 refills | Status: DC

## 2016-10-18 MED ORDER — ALBUTEROL SULFATE (2.5 MG/3ML) 0.083% IN NEBU
2.5000 mg | INHALATION_SOLUTION | Freq: Once | RESPIRATORY_TRACT | Status: AC
  Administered 2016-10-18: 2.5 mg via RESPIRATORY_TRACT
  Filled 2016-10-18: qty 3

## 2016-10-18 NOTE — Telephone Encounter (Signed)
Please call patient on Monday 7/9 to remind him to have his labs drawn. Orders already in the system.  Vicente Males, can you look at these in my absence? Thanks!

## 2016-10-18 NOTE — Progress Notes (Signed)
Discharge instructions read to patient.  Patient verbalized understanding of all instructions. It was emphasized that coumadin was to start 10/20/16. Pt verbalized understanding. A list of Doctors in this area accepting patients was provided to patient as per Case Manager. Pt discharged to home with family

## 2016-10-18 NOTE — Care Management Important Message (Signed)
Important Message  Patient Details  Name: Brett [name] MRN: [id number] Date of Birth: Oct 11, 1932   Medicare Important Message Given:  Yes    Sherald Barge, RN 10/18/2016, 1:20 PM

## 2016-10-18 NOTE — Addendum Note (Signed)
Addendum  created 10/18/16 1058 by Vista Deck, CRNA   Sign clinical note

## 2016-10-18 NOTE — Progress Notes (Signed)
Subjective: Feels good today, wants to go home. Denies abdominal pain, N/V, hematochezia, melena. No GI complaints at this time. Having some wheezing (RT in the room giving nebs at the beginning of the visit)  Objective: Vital signs in last 24 hours: Temp:  [97.6 F (36.4 C)-98.1 F (36.7 C)] 98 F (36.7 C) (07/06 0643) Pulse Rate:  [71-112] 84 (07/06 0643) Resp:  [19-22] 20 (07/06 0643) BP: (112-137)/(77-95) 120/77 (07/06 0643) SpO2:  [92 %-99 %] 95 % (07/06 0856) Last BM Date: 10/17/16 General:   Alert and oriented, pleasant Head:  Normocephalic and atraumatic. Eyes:  No icterus, sclera clear. Conjuctiva pink.  Heart:  S1, S2 present, no murmurs noted.  Lungs: Noted left-sided expiratory wheezes (minimal to mild) without rales, or rhonchi.  Abdomen:  Bowel sounds present, rounded but soft, non-tender, non-distended. No HSM or hernias noted. No rebound or guarding. No masses appreciated  Msk:  Symmetrical without gross deformities. Pulses:  Normal bilateral DP pulses noted. Extremities:  Without clubbing or edema. Neurologic:  Alert and  oriented x4;  grossly normal neurologically. Skin:  Warm and dry, intact without significant lesions.  Psych:  Alert and cooperative. Normal mood and affect.  Intake/Output from previous day: 07/05 0701 - 07/06 0700 In: 700 [I.V.:700] Out: 0  Intake/Output this shift: No intake/output data recorded.  Lab Results:  Recent Labs  10/16/16 0651 10/17/16 0624  WBC 9.2 7.0  HGB 13.0 12.9*  HCT 38.7* 37.9*  PLT 129* 165   BMET  Recent Labs  10/16/16 0651 10/17/16 0624 10/18/16 0558  NA 128* 127* 130*  K 4.4 3.7 3.8  CL 91* 90* 92*  CO2 28 27 31   GLUCOSE 134* 151* 159*  BUN 29* 21* 15  CREATININE 1.37* 0.93 1.03  CALCIUM 8.3* 8.3* 8.6*   LFT  Recent Labs  10/15/16 1311 10/16/16 0651 10/17/16 0624 10/18/16 0558  PROT  --  6.3* 6.2* 6.1*  ALBUMIN  --  3.3* 3.1* 3.0*  AST  --  161* 96* 77*  ALT  --  343* 244* 203*   ALKPHOS  --  205* 240* 271*  BILITOT 5.5* 6.4* 6.8* 5.2*  BILIDIR 3.4*  --   --  2.9*  IBILI 2.1*  --   --  2.3*   PT/INR  Recent Labs  10/17/16 0624 10/18/16 0558  LABPROT 18.4* 17.6*  INR 1.51 1.43   Hepatitis Panel No results for input(s): HEPBSAG, HCVAB, HEPAIGM, HEPBIGM in the last 72 hours.   Studies/Results: Mr 3d Recon At Scanner  Result Date: 10/17/2016 CLINICAL DATA:  Right upper quadrant pain, prior cholecystectomy, hyperbilirubinemia EXAM: MRI ABDOMEN WITHOUT AND WITH CONTRAST (INCLUDING MRCP) TECHNIQUE: Multiplanar multisequence MR imaging of the abdomen was performed both before and after the administration of intravenous contrast. Heavily T2-weighted images of the biliary and pancreatic ducts were obtained, and three-dimensional MRCP images were rendered by post processing. CONTRAST:  14mL MULTIHANCE GADOBENATE DIMEGLUMINE 529 MG/ML IV SOLN COMPARISON:  12/01/2013 FINDINGS: Motion degraded images. Lower chest: Lung bases are clear. Hepatobiliary: 10 mm cyst in segment 8 of the liver (series 4/image 19). No suspicious/enhancing hepatic lesions. No hepatic steatosis. Status post cholecystectomy.  No intrahepatic duct dilatation. Dilated common duct, measuring up to 13 mm. Three mid/distal CBD stones measuring up to 15 mm (series 5/images 6-7). Pancreas:  Within normal limits. Spleen:  Within normal limits. Adrenals/Urinary Tract:  Adrenal glands are within normal limits. 2.8 cm anterior left lower pole renal cyst. Right kidney is within normal  limits. No hydronephrosis. Stomach/Bowel: Stomach is within normal limits. Visualized bowel is unremarkable. Vascular/Lymphatic:  No evidence of abdominal aortic aneurysm. No suspicious abdominal lymphadenopathy. Other:  No abdominal ascites. Musculoskeletal: No focal osseous lesions. IMPRESSION: Motion degraded images. Status post cholecystectomy. Dilated CBD, measuring 13 mm. Choledocholithiasis with three mid/distal CBD stones measuring up  to 15 mm. ERCP is suggested. Additional ancillary findings as above. Electronically Signed   By: Julian Hy M.D.   On: 10/17/2016 08:47   Dg Ercp Biliary & Pancreatic Ducts  Result Date: 10/17/2016 CLINICAL DATA:  83 year old male with a history of choledocholithiasis EXAM: ERCP TECHNIQUE: Multiple spot images obtained with the fluoroscopic device and submitted for interpretation post-procedure. FLUOROSCOPY TIME:  Fluoroscopy Time:  2 minutes 48 seconds COMPARISON:  MR 10/17/2016, CT 10/15/2016 FINDINGS: Multiple limited intraoperative fluoroscopic spot images during ERCP. Initial image demonstrates endoscope projecting over the upper abdomen. Surgical clips in place of prior cholecystectomy. Subsequently there is cannulation of the ampulla and retrograde infusion of contrast. Multiple rounded filling defects present within the extrahepatic biliary system. There is then deployment of a balloon basket. No stent on the final image. IMPRESSION: Limited images during ERCP demonstrates treatment for choledocholithiasis. Please refer to the dictated operative report for full details of intraoperative findings and procedure. Electronically Signed   By: Corrie Mckusick D.O.   On: 10/17/2016 13:26   Mr Abdomen Mrcp W Wo Contast  Result Date: 10/17/2016 CLINICAL DATA:  Right upper quadrant pain, prior cholecystectomy, hyperbilirubinemia EXAM: MRI ABDOMEN WITHOUT AND WITH CONTRAST (INCLUDING MRCP) TECHNIQUE: Multiplanar multisequence MR imaging of the abdomen was performed both before and after the administration of intravenous contrast. Heavily T2-weighted images of the biliary and pancreatic ducts were obtained, and three-dimensional MRCP images were rendered by post processing. CONTRAST:  75mL MULTIHANCE GADOBENATE DIMEGLUMINE 529 MG/ML IV SOLN COMPARISON:  12/01/2013 FINDINGS: Motion degraded images. Lower chest: Lung bases are clear. Hepatobiliary: 10 mm cyst in segment 8 of the liver (series 4/image 19). No  suspicious/enhancing hepatic lesions. No hepatic steatosis. Status post cholecystectomy.  No intrahepatic duct dilatation. Dilated common duct, measuring up to 13 mm. Three mid/distal CBD stones measuring up to 15 mm (series 5/images 6-7). Pancreas:  Within normal limits. Spleen:  Within normal limits. Adrenals/Urinary Tract:  Adrenal glands are within normal limits. 2.8 cm anterior left lower pole renal cyst. Right kidney is within normal limits. No hydronephrosis. Stomach/Bowel: Stomach is within normal limits. Visualized bowel is unremarkable. Vascular/Lymphatic:  No evidence of abdominal aortic aneurysm. No suspicious abdominal lymphadenopathy. Other:  No abdominal ascites. Musculoskeletal: No focal osseous lesions. IMPRESSION: Motion degraded images. Status post cholecystectomy. Dilated CBD, measuring 13 mm. Choledocholithiasis with three mid/distal CBD stones measuring up to 15 mm. ERCP is suggested. Additional ancillary findings as above. Electronically Signed   By: Julian Hy M.D.   On: 10/17/2016 08:47    Assessment: 83 year old male admitted to the hospital with self-limiting upper abdominal pain. Notable bump in aminotransferases and bilirubin. Pain resolved spontaneously after admission. Mild elevation in serum lipase. Normal-appearing pancreas on CT. MRCP today c/w 3 CBD stones.   INR initially supratheraputic but normal after vitamin K. Coumadin being managed by pharmacy. ASA 81mg  daily.  Underwent ERCP yesterday with preintevention cholangiogram noting dilated common biliary tree and multiple distal filling defects but without stricture. After sphincterotomy, dilation, and passage of extractor balloon multiple stones and stone fragments were extracted. Postintervention cholangiogram noted all filling defects removed and excellent, near instantaneous drainage of the biliary tree was noted.  No significant bleeding noted.  His labs today showed improvement in AST/ALT to 77/203, stable  alkaline phosphatase, improvement in bilirubin to 5.2. Given his significant clinical improvement and improvement and labs he is appropriate for discharge from a GI perspective.  Plan: 1. Okay to discharge from GI perspective. 2. Should have LFTs rechecked on Monday (10/21/2016 - orders already entered into Epic) 3. Follow-up in our office in 2 weeks (message sent to our staff to schedule and notify patient of date/time) 4. ER precautions given.   Thank you for allowing [place] to participate in the care of Brett [name]  Walden Field, DNP, AGNP-C Adult & Gerontological Nurse Practitioner [facility]     LOS: 2 days    10/18/2016, 11:19 AM

## 2016-10-18 NOTE — Discharge Summary (Signed)
Physician Discharge Summary  Brett [name] TWS:[id number] DOB: February 24, 1933 DOA: 10/14/2016  PCP: [name], MD  Admit date: 10/14/2016 Discharge date: 10/18/2016  Admitted From: Home Disposition:  Home   Recommendations for Outpatient Follow-up:  1. Follow up with PCP in 1-2 weeks 2. Please obtain LFTs on 10/21/16 3. Please check INR on 7/11 or 7/12 and adjust coumadin dose for INR 2-3    Discharge Condition: Stable CODE STATUS: FULL Diet recommendation: Heart Healthy / Carb Modified    Brief/Interim Summary: 83 year old male with history of A. fib on Coumadin, hypertension, diabetes mellitus, hypothyroidism who presented to the hospital with a one-day history of worsening right upper quadrant abdominal pain similar to when he had previously had gallstones. He is status post laparoscopic cholecystectomy. She had nausea without vomiting. He has chronic loose stools since cholecystectomy 1-1/2 years ago; however, the patient has not had a bowel movement in 3 days since admission. His pain improved with morphine and has not recurred. He was found to have a transaminitis with AST 410, ALT 449, lipase 57. His CT of the abdomen and pelvis was unremarkable and confirmed cholecystectomy. He had dilated common bile duct and intrahepatic ducts which were attributed to his previous cholecystectomy. The patient denies any new medications over-the-counter or prescription wise  Discharge Diagnoses:  Symptomatic Choledocholithiasis -Presented with abdominal pain with Hyperbilirubinemia/Transaminasemia -10/16/2011 abdominal ultrasound--status post cholecystectomy, CBD 8 mm, fatty liver -MRCP--CBD 33mm with three mid/distal CBD stones -hepatitis serologies--neg -statin may have exacerbated situation -holding statin until LFTs normalize -bilirubin continued to climb prior to ERCP--mostly direct fraction -appreciate GI  -acetaminophen level neg -10/17/16--ERCP--choledocholithiasis, status post biliary  sphincterotomy, biliary sphincterotomy balloon dilation and stone extraction. -7/6--cleared for d/c by GI, LFTs and bili trending down  Coagulopathy -no coumadin in 3 days with INR continuing to increase -?related poor po intake x 4-5 days -consulted GI -INR 4.72 on 7/4--vitamin K given-->INR 1.51-->1.43 -restart coumadin 7/8 per GI, no need for bridge as pt has not had stroke hx  Permanent Afib -rate controlled -holding coumadin as discussed--restart on 7/8 -decrease metoprolol succinate due to soft BP -holding cardizem due soft BP-->restart after discharge -d/c lisinopril/HCTZ due to soft BP-->will not restart  Hyponatremia -likely due to poor solute intake--poor po intake x 4-5 days and a degree of SIADH due to acute medical illness -slowly improving with advancing diet, continue to hold lisinopril/HCTZ -serum osm 276 -urine osm 679 -TSH--2.053 - am lipid panel--LDL 91, triglycerides  Diabetes mellitus type 2 -10/15/16 A1C--6.3 -holding metformin -novolog sliding scale  Chronic diastolic CHF -clinically euvolemic -not on diuretics at home -daily weight  Hypothyroidism -continue synthroid  COPD -stable on RA -duobnebs -ambulated pt 7/4--no desturation -d/c oxygen >60 pack year hx  Thrombocytopenia -has been chronic, intermittent -no hepatosplenomegaly on abd [place] -serum B12--316 -fibrinogen--696  CKD stage 3 -baseline creatinine 1.0-1.3    Discharge Instructions   Allergies as of 10/18/2016   No Known Allergies     Medication List    STOP taking these medications   lisinopril-hydrochlorothiazide 20-12.5 MG tablet Commonly known as:  PRINZIDE,ZESTORETIC   pravastatin 80 MG tablet Commonly known as:  PRAVACHOL     TAKE these medications   aspirin EC 81 MG tablet Take 1 tablet (81 mg total) by mouth daily.   diltiazem 120 MG 24 hr capsule Commonly known as:  CARTIA XT Take 1 capsule (120 mg total) by mouth daily.   diphenhydrAMINE 25  MG tablet Commonly known as:  BENADRYL Take 25 mg  by mouth every 6 (six) hours as needed. For sleep   doxazosin 2 MG tablet Commonly known as:  CARDURA Take 1 tablet (2 mg total) by mouth at bedtime.   levothyroxine 50 MCG tablet Commonly known as:  SYNTHROID, LEVOTHROID Take 50 mcg by mouth daily.   metFORMIN 500 MG tablet Commonly known as:  GLUCOPHAGE Take 500 mg by mouth 2 (two) times daily with a meal.   metoprolol succinate 50 MG 24 hr tablet Commonly known as:  TOPROL-XL Take 1 tablet (50 mg total) by mouth daily. Take with or immediately following a meal.   polyvinyl alcohol 1.4 % ophthalmic solution Commonly known as:  LIQUIFILM TEARS Place 1 drop into both eyes as needed for dry eyes.   warfarin 3 MG tablet Commonly known as:  COUMADIN Take 1 tablet (3 mg total) by mouth daily at 6 PM. Start taking on:  10/20/2016 What changed:  These instructions start on 10/20/2016. If you are unsure what to do until then, ask your doctor or other care provider.       No Known Allergies  Consultations:  GI--[facility]ourk   Procedures/Studies: US Abdomen Complete  Result Date: 10/15/2016 CLINICAL DATA:  Abdominal pain for 1 day, elevated liver function tests, prior cholecystectomy, diabetes and hypertension EXAM: ABDOMEN ULTRASOUND COMPLETE COMPARISON:  CT abdomen pelvis of 10/15/2016 FINDINGS: Gallbladder: The gallbladder has previously been resected. No pain is present over the right upper quadrant. Common bile duct: Diameter: The common bile duct measures 8 mm in diameter which is within normal limits post cholecystectomy. Liver: The liver is inhomogeneous and echogenic consistent with diffuse fatty infiltration. Slight prominence of intrahepatic ducts is noted. , as was present on recent CT of the abdomen pelvis. IVC: No abnormality visualized. Pancreas: The pancreas is not well seen due to bowel gas but appear normal on recent CT. Spleen: The spleen measures 7.9 cm. Right Kidney: Length:  11.0 cm.  No hydronephrosis is seen. Left Kidney: Length: 12.3 cm. A cyst emanates from the inferior left kidney anteriorly of 2.9 cm. Abdominal aorta: Much of the abdominal aorta is obscured by bowel gas. Other findings: None. IMPRESSION: 1. Echogenic liver parenchyma consistent with fatty infiltration. 2. Intrahepatic biliary ductal dilatation as was present on recent CT of the abdomen pelvis in this patient post cholecystectomy. 3. The pancreas and abdominal aorta are obscured by bowel gas. 4. No hydronephrosis. Electronically Signed   By: Ivar Drape M.D.   On: 10/15/2016 09:39   Ct Abdomen Pelvis W Contrast  Result Date: 10/15/2016 CLINICAL DATA:  Acute onset of severe generalized abdominal pain, nausea and cold chills. Initial encounter. EXAM: CT ABDOMEN AND PELVIS WITH CONTRAST TECHNIQUE: Multidetector CT imaging of the abdomen and pelvis was performed using the standard protocol following bolus administration of intravenous contrast. CONTRAST:  131mL ISOVUE-300 IOPAMIDOL (ISOVUE-300) INJECTION 61% COMPARISON:  CT of the abdomen and pelvis, and MRCP performed 12/01/2013 FINDINGS: Lower chest: Diffuse coronary artery calcifications are seen. The visualized lung bases are grossly clear. Hepatobiliary: There is diffuse prominence of the intrahepatic biliary ducts, within normal limits status post cholecystectomy. Clips are noted at the gallbladder fossa. The liver is otherwise unremarkable. The common bile duct is prominent, but likely within normal limits status post cholecystectomy. Pancreas: The pancreas is within normal limits. Spleen: The spleen is unremarkable in appearance. Adrenals/Urinary Tract: The adrenal glands are unremarkable in appearance. Nonspecific perinephric stranding is noted bilaterally. There is no evidence of hydronephrosis. No renal or ureteral stones are identified. A  left renal cyst is noted. Stomach/Bowel: The stomach is unremarkable in appearance. The small bowel is within normal  limits. The appendix is normal in caliber, without evidence of appendicitis. Scattered diverticulosis is noted along the sigmoid colon, without evidence of diverticulitis. Vascular/Lymphatic: Scattered calcification is seen along the abdominal aorta and its branches. The abdominal aorta is otherwise grossly unremarkable. The inferior vena cava is grossly unremarkable. No retroperitoneal lymphadenopathy is seen. No pelvic sidewall lymphadenopathy is identified. Reproductive: The bladder is mildly distended and grossly remarkable. The prostate remains normal in size. Other:  A small left inguinal hernia is noted, containing only fat. Musculoskeletal: No acute osseous abnormalities are identified. Vacuum phenomenon is noted at the upper lumbar spine. Bilateral hip arthroplasties appear grossly intact. The visualized musculature is unremarkable in appearance. IMPRESSION: 1. No acute abnormality seen to explain the patient's symptoms. 2. Scattered aortic atherosclerosis. 3. Diverticulosis along the sigmoid colon, without evidence of diverticulitis. 4. Diffuse coronary artery calcifications seen. 5. Left renal cyst noted. 6. Small left inguinal hernia, containing only fat. Electronically Signed   By: Garald Balding M.D.   On: 10/15/2016 02:06   Mr 3d Recon At Scanner  Result Date: 10/17/2016 CLINICAL DATA:  Right upper quadrant pain, prior cholecystectomy, hyperbilirubinemia EXAM: MRI ABDOMEN WITHOUT AND WITH CONTRAST (INCLUDING MRCP) TECHNIQUE: Multiplanar multisequence MR imaging of the abdomen was performed both before and after the administration of intravenous contrast. Heavily T2-weighted images of the biliary and pancreatic ducts were obtained, and three-dimensional MRCP images were rendered by post processing. CONTRAST:  34mL MULTIHANCE GADOBENATE DIMEGLUMINE 529 MG/ML IV SOLN COMPARISON:  12/01/2013 FINDINGS: Motion degraded images. Lower chest: Lung bases are clear. Hepatobiliary: 10 mm cyst in segment 8 of  the liver (series 4/image 19). No suspicious/enhancing hepatic lesions. No hepatic steatosis. Status post cholecystectomy.  No intrahepatic duct dilatation. Dilated common duct, measuring up to 13 mm. Three mid/distal CBD stones measuring up to 15 mm (series 5/images 6-7). Pancreas:  Within normal limits. Spleen:  Within normal limits. Adrenals/Urinary Tract:  Adrenal glands are within normal limits. 2.8 cm anterior left lower pole renal cyst. Right kidney is within normal limits. No hydronephrosis. Stomach/Bowel: Stomach is within normal limits. Visualized bowel is unremarkable. Vascular/Lymphatic:  No evidence of abdominal aortic aneurysm. No suspicious abdominal lymphadenopathy. Other:  No abdominal ascites. Musculoskeletal: No focal osseous lesions. IMPRESSION: Motion degraded images. Status post cholecystectomy. Dilated CBD, measuring 13 mm. Choledocholithiasis with three mid/distal CBD stones measuring up to 15 mm. ERCP is suggested. Additional ancillary findings as above. Electronically Signed   By: Julian Hy M.D.   On: 10/17/2016 08:47   Dg Ercp Biliary & Pancreatic Ducts  Result Date: 10/17/2016 CLINICAL DATA:  83 year old male with a history of choledocholithiasis EXAM: ERCP TECHNIQUE: Multiple spot images obtained with the fluoroscopic device and submitted for interpretation post-procedure. FLUOROSCOPY TIME:  Fluoroscopy Time:  2 minutes 48 seconds COMPARISON:  MR 10/17/2016, CT 10/15/2016 FINDINGS: Multiple limited intraoperative fluoroscopic spot images during ERCP. Initial image demonstrates endoscope projecting over the upper abdomen. Surgical clips in place of prior cholecystectomy. Subsequently there is cannulation of the ampulla and retrograde infusion of contrast. Multiple rounded filling defects present within the extrahepatic biliary system. There is then deployment of a balloon basket. No stent on the final image. IMPRESSION: Limited images during ERCP demonstrates treatment for  choledocholithiasis. Please refer to the dictated operative report for full details of intraoperative findings and procedure. Electronically Signed   By: Corrie Mckusick D.O.   On: 10/17/2016 13:26  Mr Abdomen Mrcp W Wo Contast  Result Date: 10/17/2016 CLINICAL DATA:  Right upper quadrant pain, prior cholecystectomy, hyperbilirubinemia EXAM: MRI ABDOMEN WITHOUT AND WITH CONTRAST (INCLUDING MRCP) TECHNIQUE: Multiplanar multisequence MR imaging of the abdomen was performed both before and after the administration of intravenous contrast. Heavily T2-weighted images of the biliary and pancreatic ducts were obtained, and three-dimensional MRCP images were rendered by post processing. CONTRAST:  20mL MULTIHANCE GADOBENATE DIMEGLUMINE 529 MG/ML IV SOLN COMPARISON:  12/01/2013 FINDINGS: Motion degraded images. Lower chest: Lung bases are clear. Hepatobiliary: 10 mm cyst in segment 8 of the liver (series 4/image 19). No suspicious/enhancing hepatic lesions. No hepatic steatosis. Status post cholecystectomy.  No intrahepatic duct dilatation. Dilated common duct, measuring up to 13 mm. Three mid/distal CBD stones measuring up to 15 mm (series 5/images 6-7). Pancreas:  Within normal limits. Spleen:  Within normal limits. Adrenals/Urinary Tract:  Adrenal glands are within normal limits. 2.8 cm anterior left lower pole renal cyst. Right kidney is within normal limits. No hydronephrosis. Stomach/Bowel: Stomach is within normal limits. Visualized bowel is unremarkable. Vascular/Lymphatic:  No evidence of abdominal aortic aneurysm. No suspicious abdominal lymphadenopathy. Other:  No abdominal ascites. Musculoskeletal: No focal osseous lesions. IMPRESSION: Motion degraded images. Status post cholecystectomy. Dilated CBD, measuring 13 mm. Choledocholithiasis with three mid/distal CBD stones measuring up to 15 mm. ERCP is suggested. Additional ancillary findings as above. Electronically Signed   By: Julian Hy M.D.   On:  10/17/2016 08:47        Discharge Exam: Vitals:   10/17/16 2118 10/18/16 0643  BP: 118/78 120/77  Pulse: 90 84  Resp: 20 20  Temp: 98 F (36.7 C) 98 F (36.7 C)   Vitals:   10/17/16 1425 10/17/16 2118 10/18/16 0643 10/18/16 0856  BP: 137/87 118/78 120/77   Pulse: 94 90 84   Resp: 20 20 20    Temp: 98.1 F (36.7 C) 98 F (36.7 C) 98 F (36.7 C)   TempSrc:  Oral Oral   SpO2: 95% 94% 95% 95%  Weight:      Height:        General: Pt is alert, awake, not in acute distress Cardiovascular: RRR, S1/S2 +, no rubs, no gallops Respiratory: CTA bilaterally, no wheezing, no rhonchi Abdominal: Soft, NT, ND, bowel sounds + Extremities: no edema, no cyanosis   The results of significant diagnostics from this hospitalization (including imaging, microbiology, ancillary and laboratory) are listed below for reference.    Significant Diagnostic Studies: US Abdomen Complete  Result Date: 10/15/2016 CLINICAL DATA:  Abdominal pain for 1 day, elevated liver function tests, prior cholecystectomy, diabetes and hypertension EXAM: ABDOMEN ULTRASOUND COMPLETE COMPARISON:  CT abdomen pelvis of 10/15/2016 FINDINGS: Gallbladder: The gallbladder has previously been resected. No pain is present over the right upper quadrant. Common bile duct: Diameter: The common bile duct measures 8 mm in diameter which is within normal limits post cholecystectomy. Liver: The liver is inhomogeneous and echogenic consistent with diffuse fatty infiltration. Slight prominence of intrahepatic ducts is noted. , as was present on recent CT of the abdomen pelvis. IVC: No abnormality visualized. Pancreas: The pancreas is not well seen due to bowel gas but appear normal on recent CT. Spleen: The spleen measures 7.9 cm. Right Kidney: Length: 11.0 cm.  No hydronephrosis is seen. Left Kidney: Length: 12.3 cm. A cyst emanates from the inferior left kidney anteriorly of 2.9 cm. Abdominal aorta: Much of the abdominal aorta is obscured by  bowel gas. Other findings: None. IMPRESSION: 1.  Echogenic liver parenchyma consistent with fatty infiltration. 2. Intrahepatic biliary ductal dilatation as was present on recent CT of the abdomen pelvis in this patient post cholecystectomy. 3. The pancreas and abdominal aorta are obscured by bowel gas. 4. No hydronephrosis. Electronically Signed   By: Ivar Drape M.D.   On: 10/15/2016 09:39   Ct Abdomen Pelvis W Contrast  Result Date: 10/15/2016 CLINICAL DATA:  Acute onset of severe generalized abdominal pain, nausea and cold chills. Initial encounter. EXAM: CT ABDOMEN AND PELVIS WITH CONTRAST TECHNIQUE: Multidetector CT imaging of the abdomen and pelvis was performed using the standard protocol following bolus administration of intravenous contrast. CONTRAST:  165mL ISOVUE-300 IOPAMIDOL (ISOVUE-300) INJECTION 61% COMPARISON:  CT of the abdomen and pelvis, and MRCP performed 12/01/2013 FINDINGS: Lower chest: Diffuse coronary artery calcifications are seen. The visualized lung bases are grossly clear. Hepatobiliary: There is diffuse prominence of the intrahepatic biliary ducts, within normal limits status post cholecystectomy. Clips are noted at the gallbladder fossa. The liver is otherwise unremarkable. The common bile duct is prominent, but likely within normal limits status post cholecystectomy. Pancreas: The pancreas is within normal limits. Spleen: The spleen is unremarkable in appearance. Adrenals/Urinary Tract: The adrenal glands are unremarkable in appearance. Nonspecific perinephric stranding is noted bilaterally. There is no evidence of hydronephrosis. No renal or ureteral stones are identified. A left renal cyst is noted. Stomach/Bowel: The stomach is unremarkable in appearance. The small bowel is within normal limits. The appendix is normal in caliber, without evidence of appendicitis. Scattered diverticulosis is noted along the sigmoid colon, without evidence of diverticulitis. Vascular/Lymphatic:  Scattered calcification is seen along the abdominal aorta and its branches. The abdominal aorta is otherwise grossly unremarkable. The inferior vena cava is grossly unremarkable. No retroperitoneal lymphadenopathy is seen. No pelvic sidewall lymphadenopathy is identified. Reproductive: The bladder is mildly distended and grossly remarkable. The prostate remains normal in size. Other:  A small left inguinal hernia is noted, containing only fat. Musculoskeletal: No acute osseous abnormalities are identified. Vacuum phenomenon is noted at the upper lumbar spine. Bilateral hip arthroplasties appear grossly intact. The visualized musculature is unremarkable in appearance. IMPRESSION: 1. No acute abnormality seen to explain the patient's symptoms. 2. Scattered aortic atherosclerosis. 3. Diverticulosis along the sigmoid colon, without evidence of diverticulitis. 4. Diffuse coronary artery calcifications seen. 5. Left renal cyst noted. 6. Small left inguinal hernia, containing only fat. Electronically Signed   By: Garald Balding M.D.   On: 10/15/2016 02:06   Mr 3d Recon At Scanner  Result Date: 10/17/2016 CLINICAL DATA:  Right upper quadrant pain, prior cholecystectomy, hyperbilirubinemia EXAM: MRI ABDOMEN WITHOUT AND WITH CONTRAST (INCLUDING MRCP) TECHNIQUE: Multiplanar multisequence MR imaging of the abdomen was performed both before and after the administration of intravenous contrast. Heavily T2-weighted images of the biliary and pancreatic ducts were obtained, and three-dimensional MRCP images were rendered by post processing. CONTRAST:  17mL MULTIHANCE GADOBENATE DIMEGLUMINE 529 MG/ML IV SOLN COMPARISON:  12/01/2013 FINDINGS: Motion degraded images. Lower chest: Lung bases are clear. Hepatobiliary: 10 mm cyst in segment 8 of the liver (series 4/image 19). No suspicious/enhancing hepatic lesions. No hepatic steatosis. Status post cholecystectomy.  No intrahepatic duct dilatation. Dilated common duct, measuring up to  13 mm. Three mid/distal CBD stones measuring up to 15 mm (series 5/images 6-7). Pancreas:  Within normal limits. Spleen:  Within normal limits. Adrenals/Urinary Tract:  Adrenal glands are within normal limits. 2.8 cm anterior left lower pole renal cyst. Right kidney is within normal limits. No hydronephrosis.  Stomach/Bowel: Stomach is within normal limits. Visualized bowel is unremarkable. Vascular/Lymphatic:  No evidence of abdominal aortic aneurysm. No suspicious abdominal lymphadenopathy. Other:  No abdominal ascites. Musculoskeletal: No focal osseous lesions. IMPRESSION: Motion degraded images. Status post cholecystectomy. Dilated CBD, measuring 13 mm. Choledocholithiasis with three mid/distal CBD stones measuring up to 15 mm. ERCP is suggested. Additional ancillary findings as above. Electronically Signed   By: Julian Hy M.D.   On: 10/17/2016 08:47   Dg Ercp Biliary & Pancreatic Ducts  Result Date: 10/17/2016 CLINICAL DATA:  83 year old male with a history of choledocholithiasis EXAM: ERCP TECHNIQUE: Multiple spot images obtained with the fluoroscopic device and submitted for interpretation post-procedure. FLUOROSCOPY TIME:  Fluoroscopy Time:  2 minutes 48 seconds COMPARISON:  MR 10/17/2016, CT 10/15/2016 FINDINGS: Multiple limited intraoperative fluoroscopic spot images during ERCP. Initial image demonstrates endoscope projecting over the upper abdomen. Surgical clips in place of prior cholecystectomy. Subsequently there is cannulation of the ampulla and retrograde infusion of contrast. Multiple rounded filling defects present within the extrahepatic biliary system. There is then deployment of a balloon basket. No stent on the final image. IMPRESSION: Limited images during ERCP demonstrates treatment for choledocholithiasis. Please refer to the dictated operative report for full details of intraoperative findings and procedure. Electronically Signed   By: Corrie Mckusick D.O.   On: 10/17/2016 13:26    Mr Abdomen Mrcp W Wo Contast  Result Date: 10/17/2016 CLINICAL DATA:  Right upper quadrant pain, prior cholecystectomy, hyperbilirubinemia EXAM: MRI ABDOMEN WITHOUT AND WITH CONTRAST (INCLUDING MRCP) TECHNIQUE: Multiplanar multisequence MR imaging of the abdomen was performed both before and after the administration of intravenous contrast. Heavily T2-weighted images of the biliary and pancreatic ducts were obtained, and three-dimensional MRCP images were rendered by post processing. CONTRAST:  68mL MULTIHANCE GADOBENATE DIMEGLUMINE 529 MG/ML IV SOLN COMPARISON:  12/01/2013 FINDINGS: Motion degraded images. Lower chest: Lung bases are clear. Hepatobiliary: 10 mm cyst in segment 8 of the liver (series 4/image 19). No suspicious/enhancing hepatic lesions. No hepatic steatosis. Status post cholecystectomy.  No intrahepatic duct dilatation. Dilated common duct, measuring up to 13 mm. Three mid/distal CBD stones measuring up to 15 mm (series 5/images 6-7). Pancreas:  Within normal limits. Spleen:  Within normal limits. Adrenals/Urinary Tract:  Adrenal glands are within normal limits. 2.8 cm anterior left lower pole renal cyst. Right kidney is within normal limits. No hydronephrosis. Stomach/Bowel: Stomach is within normal limits. Visualized bowel is unremarkable. Vascular/Lymphatic:  No evidence of abdominal aortic aneurysm. No suspicious abdominal lymphadenopathy. Other:  No abdominal ascites. Musculoskeletal: No focal osseous lesions. IMPRESSION: Motion degraded images. Status post cholecystectomy. Dilated CBD, measuring 13 mm. Choledocholithiasis with three mid/distal CBD stones measuring up to 15 mm. ERCP is suggested. Additional ancillary findings as above. Electronically Signed   By: Julian Hy M.D.   On: 10/17/2016 08:47     Microbiology: No results found for this or any previous visit (from the past 240 hour(s)).   Labs: Basic Metabolic Panel:  Recent Labs Lab 10/15/16 0025 10/16/16 0651  10/17/16 0624 10/18/16 0558  NA 131* 128* 127* 130*  K 3.7 4.4 3.7 3.8  CL 92* 91* 90* 92*  CO2 28 28 27 31   GLUCOSE 189* 134* 151* 159*  BUN 19 29* 21* 15  CREATININE 1.22 1.37* 0.93 1.03  CALCIUM 9.4 8.3* 8.3* 8.6*   Liver Function Tests:  Recent Labs Lab 10/15/16 0025 10/15/16 1311 10/16/16 0651 10/17/16 0624 10/18/16 0558  AST 410*  --  161* 96* 77*  ALT  449*  --  343* 244* 203*  ALKPHOS 242*  --  205* 240* 271*  BILITOT 5.1* 5.5* 6.4* 6.8* 5.2*  PROT 7.4  --  6.3* 6.2* 6.1*  ALBUMIN 4.1  --  3.3* 3.1* 3.0*    Recent Labs Lab 10/15/16 0025  LIPASE 57*   No results for input(s): AMMONIA in the last 168 hours. CBC:  Recent Labs Lab 10/15/16 0025 10/16/16 0651 10/17/16 0624  WBC 13.1* 9.2 7.0  NEUTROABS 11.2*  --  5.5  HGB 14.7 13.0 12.9*  HCT 42.6 38.7* 37.9*  MCV 89.1 91.1 88.8  PLT 170 129* 165   Cardiac Enzymes: No results for input(s): CKTOTAL, CKMB, CKMBINDEX, TROPONINI in the last 168 hours. BNP: Invalid input(s): POCBNP CBG:  Recent Labs Lab 10/17/16 1258 10/17/16 1652 10/17/16 2115 10/18/16 0744 10/18/16 1141  GLUCAP 126* 127* 170* 159* 174*    Time coordinating discharge:  Greater than 30 minutes  Signed:  Lajoy Vanamburg, DO [facility] Pager: [phone] 10/18/2016, 12:44 PM

## 2016-10-18 NOTE — Care Management Note (Signed)
Case Management Note  Patient Details  Name: Brett [name] MRN: [id number] Date of Birth: 1933/03/28   Expected Discharge Date:  10/18/16               Expected Discharge Plan:  Home/Self Care  In-House Referral:  NA  Discharge planning Services  CM Consult  Post Acute Care Choice:  NA Choice offered to:  NA  Status of Service:  Completed, signed off   Additional Comments: Pt discharging home today. No HH or DME needs. Pt provided list of PCP's accepting new pt's with DC info.   Sherald Barge, RN 10/18/2016, 1:20 PM

## 2016-10-18 NOTE — Anesthesia Postprocedure Evaluation (Signed)
Anesthesia Post Note  Patient: Brett [name]  Procedure(s) Performed: Procedure(s) (LRB): ENDOSCOPIC RETROGRADE CHOLANGIOPANCREATOGRAPHY (ERCP) (N/A) BILLARY SPHINCTEROTOMY with ballon dilation REMOVAL OF STONES with balloon  Patient location during evaluation: Nursing Unit Anesthesia Type: General Level of consciousness: awake and awake and alert Pain management: satisfactory to patient Vital Signs Assessment: post-procedure vital signs reviewed and stable Respiratory status: spontaneous breathing Cardiovascular status: stable Postop Assessment: no signs of nausea or vomiting Anesthetic complications: no     Last Vitals:  Vitals:   10/17/16 2118 10/18/16 0643  BP: 118/78 120/77  Pulse: 90 84  Resp: 20 20  Temp: 36.7 C 36.7 C    Last Pain:  Vitals:   10/18/16 0643  TempSrc: Oral  PainSc:                  Drucie Opitz

## 2016-10-21 ENCOUNTER — Other Ambulatory Visit: Payer: Self-pay

## 2016-10-21 ENCOUNTER — Encounter: Payer: Self-pay | Admitting: Nurse Practitioner

## 2016-10-21 DIAGNOSIS — K8050 Calculus of bile duct without cholangitis or cholecystitis without obstruction: Secondary | ICD-10-CM

## 2016-10-21 LAB — HEPATIC FUNCTION PANEL
ALK PHOS: 289 U/L — AB (ref 40–115)
ALT: 158 U/L — AB (ref 9–46)
AST: 52 U/L — AB (ref 10–35)
Albumin: 3.5 g/dL — ABNORMAL LOW (ref 3.6–5.1)
BILIRUBIN INDIRECT: 1.4 mg/dL — AB (ref 0.2–1.2)
Bilirubin, Direct: 1.1 mg/dL — ABNORMAL HIGH (ref <=0.2)
TOTAL PROTEIN: 6 g/dL — AB (ref 6.1–8.1)
Total Bilirubin: 2.5 mg/dL — ABNORMAL HIGH (ref 0.2–1.2)

## 2016-10-21 NOTE — Telephone Encounter (Signed)
Pt had labs done this morning.

## 2016-10-21 NOTE — Telephone Encounter (Signed)
Noted. They are not back yet

## 2016-10-21 NOTE — Telephone Encounter (Signed)
Still elevated but slowly improving. We need to recheck in 1 week. How is he?

## 2016-10-21 NOTE — Telephone Encounter (Signed)
Forwarding to Vicente Males for FYI to review the labs.

## 2016-10-22 ENCOUNTER — Telehealth: Payer: Self-pay | Admitting: Cardiovascular Disease

## 2016-10-22 ENCOUNTER — Inpatient Hospital Stay (HOSPITAL_COMMUNITY)
Admission: EM | Admit: 2016-10-22 | Discharge: 2016-10-31 | DRG: 308 | Disposition: A | Discharge status: Home / Self Care | Payer: Medicare HMO | Attending: Internal Medicine | Admitting: Internal Medicine

## 2016-10-22 ENCOUNTER — Emergency Department (HOSPITAL_COMMUNITY): Payer: Medicare HMO

## 2016-10-22 ENCOUNTER — Encounter (HOSPITAL_COMMUNITY): Payer: Self-pay

## 2016-10-22 DIAGNOSIS — I4510 Unspecified right bundle-branch block: Secondary | ICD-10-CM | POA: Diagnosis present

## 2016-10-22 DIAGNOSIS — I5032 Chronic diastolic (congestive) heart failure: Secondary | ICD-10-CM | POA: Diagnosis not present

## 2016-10-22 DIAGNOSIS — Z79899 Other long term (current) drug therapy: Secondary | ICD-10-CM | POA: Diagnosis not present

## 2016-10-22 DIAGNOSIS — I130 Hypertensive heart and chronic kidney disease with heart failure and stage 1 through stage 4 chronic kidney disease, or unspecified chronic kidney disease: Secondary | ICD-10-CM | POA: Diagnosis not present

## 2016-10-22 DIAGNOSIS — Z7984 Long term (current) use of oral hypoglycemic drugs: Secondary | ICD-10-CM

## 2016-10-22 DIAGNOSIS — Z7982 Long term (current) use of aspirin: Secondary | ICD-10-CM

## 2016-10-22 DIAGNOSIS — R7989 Other specified abnormal findings of blood chemistry: Secondary | ICD-10-CM | POA: Diagnosis not present

## 2016-10-22 DIAGNOSIS — R002 Palpitations: Secondary | ICD-10-CM | POA: Diagnosis present

## 2016-10-22 DIAGNOSIS — Z96643 Presence of artificial hip joint, bilateral: Secondary | ICD-10-CM | POA: Diagnosis present

## 2016-10-22 DIAGNOSIS — I482 Chronic atrial fibrillation: Principal | ICD-10-CM | POA: Diagnosis present

## 2016-10-22 DIAGNOSIS — Z9842 Cataract extraction status, left eye: Secondary | ICD-10-CM | POA: Diagnosis not present

## 2016-10-22 DIAGNOSIS — I2510 Atherosclerotic heart disease of native coronary artery without angina pectoris: Secondary | ICD-10-CM | POA: Diagnosis present

## 2016-10-22 DIAGNOSIS — E1121 Type 2 diabetes mellitus with diabetic nephropathy: Secondary | ICD-10-CM | POA: Diagnosis not present

## 2016-10-22 DIAGNOSIS — Z9889 Other specified postprocedural states: Secondary | ICD-10-CM

## 2016-10-22 DIAGNOSIS — H3530 Unspecified macular degeneration: Secondary | ICD-10-CM | POA: Diagnosis present

## 2016-10-22 DIAGNOSIS — I5033 Acute on chronic diastolic (congestive) heart failure: Secondary | ICD-10-CM | POA: Diagnosis not present

## 2016-10-22 DIAGNOSIS — N183 Chronic kidney disease, stage 3 unspecified: Secondary | ICD-10-CM | POA: Diagnosis present

## 2016-10-22 DIAGNOSIS — I10 Essential (primary) hypertension: Secondary | ICD-10-CM | POA: Diagnosis not present

## 2016-10-22 DIAGNOSIS — Z7901 Long term (current) use of anticoagulants: Secondary | ICD-10-CM | POA: Diagnosis not present

## 2016-10-22 DIAGNOSIS — R791 Abnormal coagulation profile: Secondary | ICD-10-CM | POA: Diagnosis present

## 2016-10-22 DIAGNOSIS — E785 Hyperlipidemia, unspecified: Secondary | ICD-10-CM | POA: Diagnosis not present

## 2016-10-22 DIAGNOSIS — I4891 Unspecified atrial fibrillation: Secondary | ICD-10-CM | POA: Diagnosis not present

## 2016-10-22 DIAGNOSIS — E038 Other specified hypothyroidism: Secondary | ICD-10-CM | POA: Diagnosis not present

## 2016-10-22 DIAGNOSIS — Z8249 Family history of ischemic heart disease and other diseases of the circulatory system: Secondary | ICD-10-CM | POA: Diagnosis not present

## 2016-10-22 DIAGNOSIS — Z87891 Personal history of nicotine dependence: Secondary | ICD-10-CM | POA: Diagnosis not present

## 2016-10-22 DIAGNOSIS — E1122 Type 2 diabetes mellitus with diabetic chronic kidney disease: Secondary | ICD-10-CM | POA: Diagnosis present

## 2016-10-22 DIAGNOSIS — Z961 Presence of intraocular lens: Secondary | ICD-10-CM | POA: Diagnosis present

## 2016-10-22 DIAGNOSIS — E039 Hypothyroidism, unspecified: Secondary | ICD-10-CM | POA: Diagnosis not present

## 2016-10-22 DIAGNOSIS — N400 Enlarged prostate without lower urinary tract symptoms: Secondary | ICD-10-CM | POA: Diagnosis present

## 2016-10-22 LAB — CBC WITH DIFFERENTIAL/PLATELET
BASOS ABS: 0 10*3/uL (ref 0.0–0.1)
Basophils Relative: 0 %
EOS ABS: 0.1 10*3/uL (ref 0.0–0.7)
Eosinophils Relative: 1 %
HCT: 41.3 % (ref 39.0–52.0)
HEMOGLOBIN: 13.7 g/dL (ref 13.0–17.0)
Lymphocytes Relative: 20 %
Lymphs Abs: 1.7 10*3/uL (ref 0.7–4.0)
MCH: 30.3 pg (ref 26.0–34.0)
MCHC: 33.2 g/dL (ref 30.0–36.0)
MCV: 91.4 fL (ref 78.0–100.0)
Monocytes Absolute: 0.5 10*3/uL (ref 0.1–1.0)
Monocytes Relative: 6 %
NEUTROS PCT: 73 %
Neutro Abs: 6.1 10*3/uL (ref 1.7–7.7)
PLATELETS: 275 10*3/uL (ref 150–400)
RBC: 4.52 MIL/uL (ref 4.22–5.81)
RDW: 13.7 % (ref 11.5–15.5)
WBC: 8.3 10*3/uL (ref 4.0–10.5)

## 2016-10-22 LAB — COMPREHENSIVE METABOLIC PANEL
ALBUMIN: 3.7 g/dL (ref 3.5–5.0)
ALK PHOS: 243 U/L — AB (ref 38–126)
ALT: 135 U/L — AB (ref 17–63)
AST: 44 U/L — AB (ref 15–41)
Anion gap: 9 (ref 5–15)
BUN: 12 mg/dL (ref 6–20)
CHLORIDE: 99 mmol/L — AB (ref 101–111)
CO2: 28 mmol/L (ref 22–32)
CREATININE: 1.15 mg/dL (ref 0.61–1.24)
Calcium: 9.2 mg/dL (ref 8.9–10.3)
GFR calc Af Amer: >60 mL/min (ref >60)
GFR calc non Af Amer: 58 mL/min — ABNORMAL LOW (ref >60)
GLUCOSE: 149 mg/dL — AB (ref 65–99)
Potassium: 3.6 mmol/L (ref 3.5–5.1)
SODIUM: 136 mmol/L (ref 135–145)
Total Bilirubin: 2.3 mg/dL — ABNORMAL HIGH (ref 0.3–1.2)
Total Protein: 6.6 g/dL (ref 6.5–8.1)

## 2016-10-22 LAB — HEPARIN LEVEL (UNFRACTIONATED): HEPARIN UNFRACTIONATED: 0.25 [IU]/mL — AB (ref 0.30–0.70)

## 2016-10-22 LAB — GLUCOSE, CAPILLARY
Glucose-Capillary: 111 mg/dL — ABNORMAL HIGH (ref 65–99)
Glucose-Capillary: 146 mg/dL — ABNORMAL HIGH (ref 65–99)

## 2016-10-22 LAB — MRSA PCR SCREENING: MRSA BY PCR: NEGATIVE

## 2016-10-22 LAB — PROTIME-INR
INR: 1.26
Prothrombin Time: 15.9 seconds — ABNORMAL HIGH (ref 11.4–15.2)

## 2016-10-22 LAB — BRAIN NATRIURETIC PEPTIDE: B NATRIURETIC PEPTIDE 5: 406 pg/mL — AB (ref 0.0–100.0)

## 2016-10-22 LAB — TROPONIN I: Troponin I: <0.03 ng/mL (ref <0.03)

## 2016-10-22 MED ORDER — METOPROLOL TARTRATE 5 MG/5ML IV SOLN
5.0000 mg | INTRAVENOUS | Status: AC | PRN
  Administered 2016-10-22 – 2016-10-25 (×2): 5 mg via INTRAVENOUS
  Filled 2016-10-22 (×2): qty 5

## 2016-10-22 MED ORDER — WARFARIN SODIUM 1 MG PO TABS
3.0000 mg | ORAL_TABLET | Freq: Once | ORAL | Status: AC
  Administered 2016-10-22: 3 mg via ORAL
  Filled 2016-10-22 (×2): qty 1

## 2016-10-22 MED ORDER — INSULIN ASPART 100 UNIT/ML ~~LOC~~ SOLN: 0.0000 [IU] | Freq: Every day | SUBCUTANEOUS | Status: DC

## 2016-10-22 MED ORDER — HEPARIN (PORCINE) IN NACL 100-0.45 UNIT/ML-% IJ SOLN
1200.0000 [IU]/h | INTRAMUSCULAR | Status: DC
  Administered 2016-10-22 (×2): 1350 [IU]/h via INTRAVENOUS
  Administered 2016-10-23 – 2016-10-25 (×3): 1500 [IU]/h via INTRAVENOUS
  Filled 2016-10-22 (×6): qty 250

## 2016-10-22 MED ORDER — INSULIN ASPART 100 UNIT/ML ~~LOC~~ SOLN
0.0000 [IU] | Freq: Three times a day (TID) | SUBCUTANEOUS | Status: DC
  Administered 2016-10-23 (×2): 2 [IU] via SUBCUTANEOUS
  Administered 2016-10-24: 3 [IU] via SUBCUTANEOUS
  Administered 2016-10-24 – 2016-10-25 (×2): 2 [IU] via SUBCUTANEOUS
  Administered 2016-10-25: 3 [IU] via SUBCUTANEOUS
  Administered 2016-10-26 – 2016-10-27 (×4): 2 [IU] via SUBCUTANEOUS
  Administered 2016-10-27: 5 [IU] via SUBCUTANEOUS
  Administered 2016-10-28 – 2016-10-29 (×3): 2 [IU] via SUBCUTANEOUS
  Administered 2016-10-29: 3 [IU] via SUBCUTANEOUS
  Administered 2016-10-30 – 2016-10-31 (×3): 2 [IU] via SUBCUTANEOUS
  Administered 2016-10-31: 5 [IU] via SUBCUTANEOUS

## 2016-10-22 MED ORDER — ASPIRIN EC 81 MG PO TBEC
81.0000 mg | DELAYED_RELEASE_TABLET | Freq: Every day | ORAL | Status: DC
  Administered 2016-10-23 – 2016-10-31 (×9): 81 mg via ORAL
  Filled 2016-10-22 (×9): qty 1

## 2016-10-22 MED ORDER — METOPROLOL TARTRATE 5 MG/5ML IV SOLN
5.0000 mg | Freq: Once | INTRAVENOUS | Status: AC
  Administered 2016-10-22: 5 mg via INTRAVENOUS
  Filled 2016-10-22: qty 5

## 2016-10-22 MED ORDER — DILTIAZEM HCL 25 MG/5ML IV SOLN
10.0000 mg | Freq: Once | INTRAVENOUS | Status: AC
  Administered 2016-10-22: 10 mg via INTRAVENOUS

## 2016-10-22 MED ORDER — ZOLPIDEM TARTRATE 5 MG PO TABS
2.5000 mg | ORAL_TABLET | Freq: Every evening | ORAL | Status: DC | PRN
  Administered 2016-10-22 – 2016-10-30 (×8): 2.5 mg via ORAL
  Filled 2016-10-22 (×9): qty 1

## 2016-10-22 MED ORDER — METFORMIN HCL 500 MG PO TABS
500.0000 mg | ORAL_TABLET | Freq: Two times a day (BID) | ORAL | Status: DC
  Administered 2016-10-22 – 2016-10-23 (×2): 500 mg via ORAL
  Filled 2016-10-22 (×2): qty 1

## 2016-10-22 MED ORDER — DILTIAZEM HCL 100 MG IV SOLR
5.0000 mg/h | Freq: Once | INTRAVENOUS | Status: AC
  Administered 2016-10-22: 5 mg/h via INTRAVENOUS
  Filled 2016-10-22: qty 100

## 2016-10-22 MED ORDER — DILTIAZEM HCL 100 MG IV SOLR
5.0000 mg/h | INTRAVENOUS | Status: DC
  Administered 2016-10-22 – 2016-10-24 (×7): 15 mg/h via INTRAVENOUS
  Filled 2016-10-22 (×7): qty 100

## 2016-10-22 MED ORDER — METOPROLOL SUCCINATE ER 50 MG PO TB24
50.0000 mg | ORAL_TABLET | Freq: Every day | ORAL | Status: DC
  Administered 2016-10-23: 50 mg via ORAL
  Filled 2016-10-22: qty 1

## 2016-10-22 MED ORDER — DOCUSATE SODIUM 100 MG PO CAPS
100.0000 mg | ORAL_CAPSULE | Freq: Two times a day (BID) | ORAL | Status: DC
  Administered 2016-10-22 – 2016-10-31 (×18): 100 mg via ORAL
  Filled 2016-10-22 (×19): qty 1

## 2016-10-22 MED ORDER — HEPARIN BOLUS VIA INFUSION
2000.0000 [IU] | Freq: Once | INTRAVENOUS | Status: AC
  Administered 2016-10-22: 2000 [IU] via INTRAVENOUS

## 2016-10-22 MED ORDER — WARFARIN - PHARMACIST DOSING INPATIENT
Freq: Every day | Status: DC
  Administered 2016-10-22 – 2016-10-23 (×2)
  Administered 2016-10-24: 1
  Administered 2016-10-25 – 2016-10-30 (×4)

## 2016-10-22 MED ORDER — LEVOTHYROXINE SODIUM 50 MCG PO TABS
50.0000 ug | ORAL_TABLET | Freq: Every day | ORAL | Status: DC
  Administered 2016-10-23 – 2016-10-31 (×9): 50 ug via ORAL
  Filled 2016-10-22: qty 2
  Filled 2016-10-22: qty 1
  Filled 2016-10-22 (×3): qty 2
  Filled 2016-10-22: qty 1
  Filled 2016-10-22 (×3): qty 2

## 2016-10-22 NOTE — Progress Notes (Signed)
[facility] for heparin>>coumadin Indication: atrial fibrillation  No Known Allergies  Patient Measurements: Height: 6' (182.9 cm) Weight: 213 lb (96.6 kg) IBW/kg (Calculated) : 77.6 Heparin Dosing Weight: 96.6 kg  Vital Signs: Temp: 97.7 F (36.5 C) (07/10 2000) Temp Source: Oral (07/10 2000) BP: 129/88 (07/10 1900) Pulse Rate: 114 (07/10 1900)  Labs:  Recent Labs  10/22/16 1100 10/22/16 1202 10/22/16 2125  HGB 13.7  --   --   HCT 41.3  --   --   PLT 275  --   --   LABPROT  --  15.9*  --   INR  --  1.26  --   HEPARINUNFRC  --   --  0.25*  CREATININE 1.15  --   --   TROPONINI <0.03  --   --     Estimated Creatinine Clearance: 60.7 mL/min (by C-G formula based on SCr of 1.15 mg/dL).   Medical History: Past Medical History:  Diagnosis Date  . Atrial fibrillation ([place])    On coumadin  . Chronic kidney disease (CKD), stage III (moderate)   . Dysrhythmia    AFib  . Essential hypertension, benign   . History of cardiac catheterization    Details not certain - reportedly no interventions  . Hypothyroidism   . Macular degeneration   . Pulmonary hypertension ([place]) 12/03/2013   PA pressure 56. Ejection fraction 55-60%  . Type 2 diabetes mellitus (HCC)     Medications:  Prescriptions Prior to Admission  Medication Sig Dispense Refill Last Dose  . aspirin EC 81 MG tablet Take 1 tablet (81 mg total) by mouth daily.   10/22/2016 at Unknown time  . diltiazem (CARTIA XT) 120 MG 24 hr capsule Take 1 capsule (120 mg total) by mouth daily. 90 capsule 3 10/21/2016 at Unknown time  . diphenhydrAMINE (BENADRYL) 50 MG tablet Take 50 mg by mouth at bedtime as needed for sleep.   10/21/2016 at Unknown time  . docusate sodium (COLACE) 100 MG capsule Take 100 mg by mouth 2 (two) times daily.   Past Week at Unknown time  . doxazosin (CARDURA) 2 MG tablet Take 1 tablet (2 mg total) by mouth at bedtime. 90 tablet 3 10/21/2016 at Unknown time  .  levothyroxine (SYNTHROID, LEVOTHROID) 50 MCG tablet Take 50 mcg by mouth daily.   10/22/2016 at Unknown time  . metFORMIN (GLUCOPHAGE) 500 MG tablet Take 500 mg by mouth 2 (two) times daily with a meal.    10/22/2016 at Unknown time  . metoprolol succinate (TOPROL-XL) 50 MG 24 hr tablet Take 1 tablet (50 mg total) by mouth daily. Take with or immediately following a meal. 90 tablet 3 10/22/2016 at 0600  . warfarin (COUMADIN) 3 MG tablet Take 1 tablet (3 mg total) by mouth daily at 6 PM. 30 tablet 0 10/21/2016 at 2300   Assessment: 83 yo man on coumadin for afib.  He was admitted 10/14/16 with elevated INR and required Vitamin K 5mg  IV on 7/4.  He had ERCP on 7/5 and coumadin was restarted 7/8.  INR today is 1.26.  Heparin bridge will start while INR is subtherapeutic.  Goal of Therapy:  INR 2-3 Heparin level 0.3-0.7 units/ml Monitor platelets by anticoagulation protocol: Yes   Plan:  Heparin bolus 2000 unit and drip at 1350 units/hr Coumadin 3 mg po today Check heparin level 6-8 hours after start Daily HL, CBC, PT/INR. Monitor for bleeding complications  PM: Heparin level slightly below goal. No  Bleeding noted. Increase to 1450 units/hr F/U AM labs.  Biagio Quint R 10/22/2016,10:29 PM

## 2016-10-22 NOTE — Telephone Encounter (Signed)
Still at hospital.

## 2016-10-22 NOTE — ED Provider Notes (Signed)
[facility] DEPT Provider Note   CSN: [id number] Arrival date & time: 10/22/16  1039     History   Chief Complaint Chief Complaint  Patient presents with  . Atrial Fibrillation    HPI Brett [name] is a 83 y.o. male.  Patient complains of rapid heart rate. Patient has a history of A. fib and is on a beta blocker and Cardizem   The history is provided by the patient.  Atrial Fibrillation  This is a recurrent problem. The current episode started less than 1 hour ago. The problem occurs constantly. The problem has not changed since onset.Pertinent negatives include no chest pain, no abdominal pain and no headaches. Nothing relieves the symptoms. He has tried nothing for the symptoms.    Past Medical History:  Diagnosis Date  . Atrial fibrillation ([place])    On coumadin  . Chronic kidney disease (CKD), stage III (moderate)   . Dysrhythmia    AFib  . Essential hypertension, benign   . History of cardiac catheterization    Details not certain - reportedly no interventions  . Hypothyroidism   . Macular degeneration   . Pulmonary hypertension ([place]) 12/03/2013   PA pressure 56. Ejection fraction 55-60%  . Type 2 diabetes mellitus [facility])     Patient Active Problem List   Diagnosis Date Noted  . Jaundice   . Periumbilical pain   . Elevated lipase   . Coagulopathy ([place]) 10/16/2016  . Elevated liver enzymes   . Hyperbilirubinemia   . Hyponatremia   . Abdominal pain 10/15/2016  . A-fib ([place]) 02/23/2014  . Diabetes ([place]) 02/23/2014  . BP (high blood pressure) 02/23/2014  . HLD (hyperlipidemia) 02/23/2014  . Excess weight 02/23/2014  . Chronic diastolic heart failure ([place]) 12/24/2013  . Acute bronchitis 12/06/2013  . DIC (disseminated intravascular coagulation) ([place]) 12/04/2013  . Thrombocytopenia, unspecified ([place]) 12/03/2013  . Pulmonary hypertension ([place]) 12/03/2013  . Wheezing 12/02/2013  . Obesity, morbid ([place]) 12/02/2013  . Transaminitis 12/02/2013  . Type 2  diabetes mellitus with diabetic nephropathy ([place]) 12/01/2013  . Hypertension 12/01/2013  . Atrial fibrillation with RVR ([place]) 12/01/2013  . Gall stones, common bile duct 12/01/2013  . Pancreatitis due to biliary obstruction 12/01/2013  . Chronic kidney disease (CKD), stage III (moderate) 12/01/2013  . Hypothyroidism 12/01/2013  . Cholelithiasis 12/01/2013  . History of other specified conditions presenting hazards to health 02/09/2008  . Lumbar back sprain 02/09/2008  . Herpes zoster keratoconjunctivitis 09/15/2007  . Acute systolic heart failure ([place]) 08/20/2007  . PNA (pneumonia) 08/20/2007  . Fast heart beat 08/20/2007  . Feeling bilious 08/16/2007    Past Surgical History:  Procedure Laterality Date  . CARDIOVERSION N/A 12/10/2013   Procedure: CARDIOVERSION;  Surgeon: Herminio Commons, MD;  Location: [facility] ORS;  Service: Endoscopy;  Laterality: N/A;  . CATARACT EXTRACTION W/PHACO Left 11/13/2015   Procedure: CATARACT EXTRACTION PHACO AND INTRAOCULAR LENS PLACEMENT LEFT EYE CDE=7.76;  Surgeon: Williams Che, MD;  Location: [facility] ORS;  Service: Ophthalmology;  Laterality: Left;  . CHOLECYSTECTOMY    . COLONOSCOPY     Remote > 10 years ago  . ERCP N/A 10/17/2016   Procedure: ENDOSCOPIC RETROGRADE CHOLANGIOPANCREATOGRAPHY (ERCP);  Surgeon: Daneil Dolin, MD;  Location: [facility] ENDO SUITE;  Service: Endoscopy;  Laterality: N/A;  . REMOVAL OF STONES  10/17/2016   Procedure: REMOVAL OF STONES with balloon;  Surgeon: Daneil Dolin, MD;  Location: [facility] ENDO SUITE;  Service: Endoscopy;;  . REPLACEMENT TOTAL HIP W/  RESURFACING IMPLANTS Bilateral   . SPHINCTEROTOMY  10/17/2016   Procedure: BILLARY SPHINCTEROTOMY with ballon dilation;  Surgeon: Daneil Dolin, MD;  Location: [facility] ENDO SUITE;  Service: Endoscopy;;  . TEE WITHOUT CARDIOVERSION N/A 12/10/2013   Procedure: TRANSESOPHAGEAL ECHOCARDIOGRAM (TEE);  Surgeon: Herminio Commons, MD;  Location: [facility] ORS;  Service: Endoscopy;  Laterality: N/A;        Home Medications    Prior to Admission medications   Medication Sig Start Date End Date Taking? Authorizing Provider  aspirin EC 81 MG tablet Take 1 tablet (81 mg total) by mouth daily. 02/15/15  Yes Herminio Commons, MD  diltiazem (CARTIA XT) 120 MG 24 hr capsule Take 1 capsule (120 mg total) by mouth daily. 02/15/16  Yes Herminio Commons, MD  diphenhydrAMINE (BENADRYL) 50 MG tablet Take 50 mg by mouth at bedtime as needed for sleep.   Yes [provider]  docusate sodium (COLACE) 100 MG capsule Take 100 mg by mouth 2 (two) times daily.   Yes [provider]  doxazosin (CARDURA) 2 MG tablet Take 1 tablet (2 mg total) by mouth at bedtime. 04/24/15  Yes Herminio Commons, MD  levothyroxine (SYNTHROID, LEVOTHROID) 50 MCG tablet Take 50 mcg by mouth daily.   Yes [provider]  metFORMIN (GLUCOPHAGE) 500 MG tablet Take 500 mg by mouth 2 (two) times daily with a meal.    Yes [provider]  metoprolol succinate (TOPROL-XL) 50 MG 24 hr tablet Take 1 tablet (50 mg total) by mouth daily. Take with or immediately following a meal. 04/24/15  Yes Herminio Commons, MD  warfarin (COUMADIN) 3 MG tablet Take 1 tablet (3 mg total) by mouth daily at 6 PM. 10/20/16  Yes Tat, Shanon Brow, MD    Family History Family History  Problem Relation Age of Onset  . Hypertension Mother   . Hypertension Father   . Colon cancer Neg Hx     Social History Social History  Substance Use Topics  . Smoking status: Former Smoker    Packs/day: 1.00    Years: 37.00    Types: Cigarettes    Start date: 11/01/1952    Quit date: 08/18/1978  . Smokeless tobacco: Never Used  . Alcohol use 0.0 oz/week     Comment: Occasional     Allergies   Patient has no known allergies.   Review of Systems Review of Systems  Constitutional: Negative for appetite change and fatigue.  HENT: Negative for congestion, ear discharge and sinus pressure.   Eyes: Negative for discharge.   Respiratory: Negative for cough.   Cardiovascular: Negative for chest pain.       Palpitations  Gastrointestinal: Negative for abdominal pain and diarrhea.  Genitourinary: Negative for frequency and hematuria.  Musculoskeletal: Negative for back pain.  Skin: Negative for rash.  Neurological: Negative for seizures and headaches.  Psychiatric/Behavioral: Negative for hallucinations.     Physical Exam Updated Vital Signs BP 121/82 (BP Location: Left Arm)   Pulse (!) 115   Temp 97.9 F (36.6 C) (Oral)   Resp 18   Ht 6' (1.829 m)   Wt 96.6 kg (213 lb)   SpO2 98%   BMI 28.89 kg/m   Physical Exam  Constitutional: He is oriented to person, place, and time. He appears well-developed.  HENT:  Head: Normocephalic.  Eyes: Conjunctivae and EOM are normal. No scleral icterus.  Neck: Neck supple. No thyromegaly present.  Cardiovascular: Regular rhythm.  Exam reveals no gallop and no  friction rub.   No murmur heard. Rapid irregular heartbeat  Pulmonary/Chest: No stridor. He has no wheezes. He has no rales. He exhibits no tenderness.  Abdominal: He exhibits no distension. There is no tenderness. There is no rebound.  Musculoskeletal: Normal range of motion. He exhibits no edema.  Lymphadenopathy:    He has no cervical adenopathy.  Neurological: He is oriented to person, place, and time. He exhibits normal muscle tone. Coordination normal.  Skin: No rash noted. No erythema.  Psychiatric: He has a normal mood and affect. His behavior is normal.  Nursing note reviewed.    ED Treatments / Results  Labs (all labs ordered are listed, but only abnormal results are displayed) Labs Reviewed  COMPREHENSIVE METABOLIC PANEL - Abnormal; Notable for the following:       Result Value   Chloride 99 (*)    Glucose, Bld 149 (*)    AST 44 (*)    ALT 135 (*)    Alkaline Phosphatase 243 (*)    Total Bilirubin 2.3 (*)    GFR calc non Af Amer 58 (*)    All other components within normal limits   BRAIN NATRIURETIC PEPTIDE - Abnormal; Notable for the following:    B Natriuretic Peptide 406.0 (*)    All other components within normal limits  PROTIME-INR - Abnormal; Notable for the following:    Prothrombin Time 15.9 (*)    All other components within normal limits  CBC WITH DIFFERENTIAL/PLATELET  TROPONIN I    EKG  EKG Interpretation None       Radiology Dg Chest Portable 1 View  Result Date: 10/22/2016 CLINICAL DATA:  Weakness.  Atrial fibrillation. EXAM: PORTABLE CHEST 1 VIEW COMPARISON:  PA and lateral chest 08/22/2016. FINDINGS: Calcified left upper lobe granuloma is unchanged. The lungs are otherwise clear. Heart size is normal. No pneumothorax or pleural effusion. No acute bony abnormality. IMPRESSION: No acute disease. Electronically Signed   By: Inge Rise M.D.   On: 10/22/2016 11:24    Procedures Procedures (including critical care time)  Medications Ordered in ED Medications  diltiazem (CARDIZEM) injection 10 mg (10 mg Intravenous Given 10/22/16 1113)  diltiazem (CARDIZEM) 100 mg in dextrose 5 % 100 mL (1 mg/mL) infusion (10 mg/hr Intravenous Rate/Dose Change 10/22/16 1138)  diltiazem (CARDIZEM) injection 10 mg (10 mg Intravenous Bolus 10/22/16 1137)  metoprolol tartrate (LOPRESSOR) injection 5 mg (5 mg Intravenous Given 10/22/16 1205)     Initial Impression / Assessment and Plan / ED Course  I have reviewed the triage vital signs and the nursing notes.  Pertinent labs & imaging results that were available during my care of the patient were reviewed by me and considered in my medical decision making (see chart for details). ,edcr CRITICAL CARE Performed by: Almer Bushey L Total critical care time: 35 minutes Critical care time was exclusive of separately billable procedures and treating other patients. Critical care was necessary to treat or prevent imminent or life-threatening deterioration. Critical care was time spent personally by me on the  following activities: development of treatment plan with patient and/or surrogate as well as nursing, discussions with consultants, evaluation of patient's response to treatment, examination of patient, obtaining history from patient or surrogate, ordering and performing treatments and interventions, ordering and review of laboratory studies, ordering and review of radiographic studies, pulse oximetry and re-evaluation of patient's condition.   patient with rapid atrial fib. Patient will be admitted for continued care of atrial fib.  Final Clinical Impressions(s) / ED Diagnoses   Final diagnoses:  Atrial fibrillation with RVR ([place])    New Prescriptions New Prescriptions   No medications on file     Milton Ferguson, MD 10/22/16 1257

## 2016-10-22 NOTE — Telephone Encounter (Signed)
Had surgery this past Friday.   Before he left hospital they stopped his BP medication.  Today his BO was 168/106 with pulse of 134.  He would like to speak with a nurse in reference to if he needs to start back medication

## 2016-10-22 NOTE — H&P (Signed)
History and Physical    ELVYN KROHN URK:[id number] DOB: 01/22/1933 DOA: 10/22/2016  PCP: Patient, No Pcp Per  Patient coming from: Home.    Chief Complaint: Palpitation.   HPI: TOMOYA RINGWALD is an 83 y.o. male with hx of chronic afib on Coumadin, but stopped last week and received Vit K for ERCP and sphinterotomy, with holding of his Cardiazem and reducing his BB due to hypotension, hx of CKD III, DM on Metformin, hypothryoidism on 50 mcg of thyroid supplement, presented to the ER with palpitation and found to have RVR on top of his baseline chronic afib.  Work up included an EKG confirming afib with RVR and no ischemic changes.  He doesn't drink alcohol, and his TSH was normal a few days ago.  Serology was unremarkable with normal WBC Cr, and Hb.  He was started on IV cardiazem drip titrate to 15mg  per hour, and Diltiazem 10mg  x 2, along with 1 dose of Lopressor IV.  Hospitallist was asked to admit him for tachyarrhythmia.    ED Course:  See above.  Rewiew of Systems:  Constitutional: Negative for malaise, fever and chills. No significant weight loss or weight gain Eyes: Negative for eye pain, redness and discharge, diplopia, visual changes, or flashes of light. ENMT: Negative for ear pain, hoarseness, nasal congestion, sinus pressure and sore throat. No headaches; tinnitus, drooling, or problem swallowing. Cardiovascular: Negative for chest pain, palpitations, diaphoresis, dyspnea and peripheral edema. ; No orthopnea, PND Respiratory: Negative for cough, hemoptysis, wheezing and stridor. No pleuritic chestpain. Gastrointestinal: Negative for diarrhea, constipation,  melena, blood in stool, hematemesis, jaundice and rectal bleeding.    Genitourinary: Negative for frequency, dysuria, incontinence,flank pain and hematuria; Musculoskeletal: Negative for back pain and neck pain. Negative for swelling and trauma.;  Skin: . Negative for pruritus, rash, abrasions, bruising and skin lesion.;  ulcerations Neuro: Negative for headache, lightheadedness and neck stiffness. Negative for weakness, altered level of consciousness , altered mental status, extremity weakness, burning feet, involuntary movement, seizure and syncope.  Psych: negative for anxiety, depression, insomnia, tearfulness, panic attacks, hallucinations, paranoia, suicidal or homicidal ideation    Past Medical History:  Diagnosis Date  . Atrial fibrillation ([place])    On coumadin  . Chronic kidney disease (CKD), stage III (moderate)   . Dysrhythmia    AFib  . Essential hypertension, benign   . History of cardiac catheterization    Details not certain - reportedly no interventions  . Hypothyroidism   . Macular degeneration   . Pulmonary hypertension ([place]) 12/03/2013   PA pressure 56. Ejection fraction 55-60%  . Type 2 diabetes mellitus ([place])    Past Surgical History:  Procedure Laterality Date  . CARDIOVERSION N/A 12/10/2013   Procedure: CARDIOVERSION;  Surgeon: Herminio Commons, MD;  Location: [facility];  Service: Endoscopy;  Laterality: N/A;  . CATARACT EXTRACTION W/PHACO Left 11/13/2015   Procedure: CATARACT EXTRACTION PHACO AND INTRAOCULAR LENS PLACEMENT LEFT EYE CDE=7.76;  Surgeon: Williams Che, MD;  Location: [facility];  Service: Ophthalmology;  Laterality: Left;  . CHOLECYSTECTOMY    . COLONOSCOPY     Remote > 10 years ago  . ERCP N/A 10/17/2016   Procedure: ENDOSCOPIC RETROGRADE CHOLANGIOPANCREATOGRAPHY (ERCP);  Surgeon: Daneil Dolin, MD;  Location[facility];  Service: Endoscopy;  Laterality: N/A;  . REMOVAL OF STONES  10/17/2016   Procedure: REMOVAL OF STONES with balloon;  Surgeon: Daneil Dolin, MD;  Location: [facility];  Service: Endoscopy;;  . REPLACEMENT  TOTAL HIP W/  RESURFACING IMPLANTS Bilateral   . SPHINCTEROTOMY  10/17/2016   Procedure: BILLARY SPHINCTEROTOMY with ballon dilation;  Surgeon: Daneil Dolin, MD;  Location: [facility];  Service: Endoscopy;;  . TEE WITHOUT  CARDIOVERSION N/A 12/10/2013   Procedure: TRANSESOPHAGEAL ECHOCARDIOGRAM (TEE);  Surgeon: Herminio Commons, MD;  Location: [facility];  Service: Endoscopy;  Laterality: N/A;     reports that he quit smoking about 38 years ago. His smoking use included Cigarettes. He started smoking about 64 years ago. He has a 37.00 pack-year smoking history. He has never used smokeless tobacco. He reports that he drinks alcohol. He reports that he does not use drugs.  No Known Allergies  Family History  Problem Relation Age of Onset  . Hypertension Mother   . Hypertension Father   . Colon cancer Neg Hx      Prior to Admission medications   Medication Sig Start Date End Date Taking? Authorizing Provider  aspirin EC 81 MG tablet Take 1 tablet (81 mg total) by mouth daily. 02/15/15  Yes Herminio Commons, MD  diltiazem (CARTIA XT) 120 MG 24 hr capsule Take 1 capsule (120 mg total) by mouth daily. 02/15/16  Yes Herminio Commons, MD  diphenhydrAMINE (BENADRYL) 50 MG tablet Take 50 mg by mouth at bedtime as needed for sleep.   Yes [provider]  docusate sodium (COLACE) 100 MG capsule Take 100 mg by mouth 2 (two) times daily.   Yes [provider]  doxazosin (CARDURA) 2 MG tablet Take 1 tablet (2 mg total) by mouth at bedtime. 04/24/15  Yes Herminio Commons, MD  levothyroxine (SYNTHROID, LEVOTHROID) 50 MCG tablet Take 50 mcg by mouth daily.   Yes [provider]  metFORMIN (GLUCOPHAGE) 500 MG tablet Take 500 mg by mouth 2 (two) times daily with a meal.    Yes [provider]  metoprolol succinate (TOPROL-XL) 50 MG 24 hr tablet Take 1 tablet (50 mg total) by mouth daily. Take with or immediately following a meal. 04/24/15  Yes Herminio Commons, MD  warfarin (COUMADIN) 3 MG tablet Take 1 tablet (3 mg total) by mouth daily at 6 PM. 10/20/16  Yes TatShanon Brow, MD    Physical Exam: Vitals:   10/22/16 1233 10/22/16 1300 10/22/16 1332 10/22/16 1400  BP: 121/82 (!) 126/98  (!) 126/98 122/86  Pulse: (!) 115 (!) 120 (!) 129 (!) 116  Resp: 18  18 (!) 21  Temp:      TempSrc:      SpO2: 98%  94% 95%  Weight:      Height:       Constitutional: NAD, calm, comfortable Vitals:   10/22/16 1233 10/22/16 1300 10/22/16 1332 10/22/16 1400  BP: 121/82 (!) 126/98 (!) 126/98 122/86  Pulse: (!) 115 (!) 120 (!) 129 (!) 116  Resp: 18  18 (!) 21  Temp:      TempSrc:      SpO2: 98%  94% 95%  Weight:      Height:       Eyes: PERRL, lids and conjunctivae normal ENMT: Mucous membranes are moist. Posterior pharynx clear of any exudate or lesions.Normal dentition.  Neck: normal, supple, no masses, no thyromegaly Respiratory: clear to auscultation bilaterally, no wheezing, no crackles. Normal respiratory effort. No accessory muscle use.  Cardiovascular: Regular rate and rhythm, no murmurs / rubs / gallops. No extremity edema. 2+ pedal pulses. No carotid bruits.  Abdomen: no tenderness, no masses palpated. No  hepatosplenomegaly. Bowel sounds positive.  Musculoskeletal: no clubbing / cyanosis. No joint deformity upper and lower extremities. Good ROM, no contractures. Normal muscle tone.  Skin: no rashes, lesions, ulcers. No induration Neurologic: CN 2-12 grossly intact. Sensation intact, DTR normal. Strength 5/5 in all 4.  Psychiatric: Normal judgment and insight. Alert and oriented x 3. Normal mood.    Labs on Admission: I have personally reviewed following labs and imaging studies CBC:  Recent Labs Lab 10/16/16 0651 10/17/16 0624 10/22/16 1100  WBC 9.2 7.0 8.3  NEUTROABS  --  5.5 6.1  HGB 13.0 12.9* 13.7  HCT 38.7* 37.9* 41.3  MCV 91.1 88.8 91.4  PLT 129* 165 540   Basic Metabolic Panel:  Recent Labs Lab 10/16/16 0651 10/17/16 0624 10/18/16 0558 10/22/16 1100  NA 128* 127* 130* 136  K 4.4 3.7 3.8 3.6  CL 91* 90* 92* 99*  CO2 28 27 31 28   GLUCOSE 134* 151* 159* 149*  BUN 29* 21* 15 12  CREATININE 1.37* 0.93 1.03 1.15  CALCIUM 8.3* 8.3* 8.6* 9.2    GFR: Estimated Creatinine Clearance: 60.7 mL/min (by C-G formula based on SCr of 1.15 mg/dL). Liver Function Tests:  Recent Labs Lab 10/16/16 0651 10/17/16 0624 10/18/16 0558 10/21/16 0909 10/22/16 1100  AST 161* 96* 77* 52* 44*  ALT 343* 244* 203* 158* 135*  ALKPHOS 205* 240* 271* 289* 243*  BILITOT 6.4* 6.8* 5.2* 2.5* 2.3*  PROT 6.3* 6.2* 6.1* 6.0* 6.6  ALBUMIN 3.3* 3.1* 3.0* 3.5* 3.7   Coagulation Profile:  Recent Labs Lab 10/16/16 0651 10/17/16 0624 10/18/16 0558 10/22/16 1202  INR 4.72* 1.51 1.43 1.26   Cardiac Enzymes:  Recent Labs Lab 10/22/16 1100  TROPONINI <0.03   CBG:  Recent Labs Lab 10/17/16 1258 10/17/16 1652 10/17/16 2115 10/18/16 0744 10/18/16 1141  GLUCAP 126* 127* 170* 159* 174*   Urine analysis:    Component Value Date/Time   COLORURINE AMBER (A) 10/15/2016 0320   APPEARANCEUR CLEAR 10/15/2016 0320   LABSPEC >1.046 (H) 10/15/2016 0320   PHURINE 5.0 10/15/2016 0320   GLUCOSEU 50 (A) 10/15/2016 0320   HGBUR MODERATE (A) 10/15/2016 0320   BILIRUBINUR NEGATIVE 10/15/2016 0320   KETONESUR 5 (A) 10/15/2016 0320   PROTEINUR NEGATIVE 10/15/2016 0320   UROBILINOGEN 0.2 12/01/2013 0740   NITRITE NEGATIVE 10/15/2016 0320   LEUKOCYTESUR NEGATIVE 10/15/2016 0320    Radiological Exams on Admission: Dg Chest Portable 1 View  Result Date: 10/22/2016 CLINICAL DATA:  Weakness.  Atrial fibrillation. EXAM: PORTABLE CHEST 1 VIEW COMPARISON:  PA and lateral chest 08/22/2016. FINDINGS: Calcified left upper lobe granuloma is unchanged. The lungs are otherwise clear. Heart size is normal. No pneumothorax or pleural effusion. No acute bony abnormality. IMPRESSION: No acute disease. Electronically Signed   By: Inge Rise M.D.   On: 10/22/2016 11:24    EKG: Independently reviewed.   Assessment/Plan Active Problems:   Type 2 diabetes mellitus with diabetic nephropathy (HCC)   Hypertension   Atrial fibrillation with RVR (HCC)   Chronic kidney  disease (CKD), stage III (moderate)   Hypothyroidism   Chronic diastolic heart failure (HCC)   HLD (hyperlipidemia)   PLAN:   Chronic afib with RVR:  I think it is due to reducing doses of his Lopressor and stopping cardiazem during his last admission due to hypotension, that he is in tachyarrhythmia at this time.  Will admit him to SDU, continue with IV Cardiazem, and continue with his oral BB.   Due to his chronic  diastolic heart failure, will avoid aggressive IVF.  He has no chest pain or SOB.  Will continue with his coumadin, and given that he had received vit K last admission, will bridge him with IV heparin for now.    Hypothyrodism:  Continue with low dose supplement (50 mcg), as his recent TSH was in desired range.  HTN:  BP is no longer soft.  Will continue with BB and IV Cardiazem.  Hold ACE I for now.   CKD:  Stable.   HLD:  Continue with meds.  DM:  Will continue with metformin.  Place on carb modified diet.    DVT prophylaxis: IV Heparin and Coumadin.  Code Status: FULL CODE>  Family Communication: son at bedside.  Disposition Plan: home  Consults called: None. Admission status: inpatient.    Christiana Gurevich MD FACP. [facility]  If 7PM-7AM, please contact night-coverage [email or web address] Password [email or web address]  10/22/2016, 2:40 PM

## 2016-10-22 NOTE — Progress Notes (Signed)
ANTICOAGULATION CONSULT NOTE - Initial Consult  Pharmacy Consult for heparin>>coumadin Indication: atrial fibrillation  No Known Allergies  Patient Measurements: Height: 6' (182.9 cm) Weight: 213 lb (96.6 kg) IBW/kg (Calculated) : 77.6 Heparin Dosing Weight: 96.6 kg  Vital Signs: Temp: 97.9 F (36.6 C) (07/10 1050) Temp Source: Oral (07/10 1050) BP: 133/105 (07/10 1451) Pulse Rate: 123 (07/10 1451)  Labs:  Recent Labs  10/22/16 1100 10/22/16 1202  HGB 13.7  --   HCT 41.3  --   PLT 275  --   LABPROT  --  15.9*  INR  --  1.26  CREATININE 1.15  --   TROPONINI <0.03  --     Estimated Creatinine Clearance: 60.7 mL/min (by C-G formula based on SCr of 1.15 mg/dL).   Medical History: Past Medical History:  Diagnosis Date  . Atrial fibrillation ([place])    On coumadin  . Chronic kidney disease (CKD), stage III (moderate)   . Dysrhythmia    AFib  . Essential hypertension, benign   . History of cardiac catheterization    Details not certain - reportedly no interventions  . Hypothyroidism   . Macular degeneration   . Pulmonary hypertension ([place]) 12/03/2013   PA pressure 56. Ejection fraction 55-60%  . Type 2 diabetes mellitus (HCC)     Medications:  See medication history  Assessment: 83 yo man on coumadin for afib.  He was admitted 10/14/16 with elevated INR and required Vitamin K 5mg  IV on 7/4.  He had ERCP on 7/5 and coumadin was restarted 7/8.  INR today is 1.26.  Heparin bridge will start while INR is subtherapeutic. Goal of Therapy:  INR 2-3 Heparin level 0.3-0.7 units/ml Monitor platelets by anticoagulation protocol: Yes   Plan:  Heparin bolus 2000 unit and drip at 1350 units/hr Coumadin 3 mg po today Check heparin level 6-8 hours after start Daily HL, CBC, PT/INR. Monitor for bleeding complications  Edom Schmuhl [place] 10/22/2016,3:05 PM

## 2016-10-22 NOTE — Telephone Encounter (Signed)
Will call pt later. He is at ED now.

## 2016-10-22 NOTE — ED Triage Notes (Signed)
Pt reports had abd surgery  For gall stones in bile duct last Thursday and was d/c'd on  Friday and says hasn't been feeling well since then.  Reports he was taken off of some of his bp medications last week.  This morning pt checked bp and hr and bp was 160's/110 and hr 150's.  Pt called cardiologist and was instructed to come to er for evaluation.  Denies any pain or dizziness, reports "little" sob and generalized weakness.

## 2016-10-22 NOTE — Plan of Care (Signed)
Problem: Safety: Goal: Ability to remain free from injury will improve Outcome: Progressing Bed in low position, side rails up, call bell and personal items with in reach   

## 2016-10-22 NOTE — Telephone Encounter (Signed)
Pt says BP has been running high since early this morning -163/115 HR 148 now per home BP monitor - d/c summary from [facility] Lisinopril/HCTZ 20-12.5 mg was d/c. Pt says he has been feeling weak and tired since Sunday. Says he took Toprol XL 50 mg this morning before taking BP/HR - pt has hx of Afib - advised pt best place for evaluation would be [facility] with increased HR/elevated BP and weakness - pt agreeable and says he had someone there to drive him - routed to provider FYI

## 2016-10-23 DIAGNOSIS — I4891 Unspecified atrial fibrillation: Secondary | ICD-10-CM

## 2016-10-23 LAB — HEPARIN LEVEL (UNFRACTIONATED): HEPARIN UNFRACTIONATED: 0.44 [IU]/mL (ref 0.30–0.70)

## 2016-10-23 LAB — CBC
HCT: 39.4 % (ref 39.0–52.0)
Hemoglobin: 12.9 g/dL — ABNORMAL LOW (ref 13.0–17.0)
MCH: 30.4 pg (ref 26.0–34.0)
MCHC: 32.7 g/dL (ref 30.0–36.0)
MCV: 92.9 fL (ref 78.0–100.0)
PLATELETS: 258 10*3/uL (ref 150–400)
RBC: 4.24 MIL/uL (ref 4.22–5.81)
RDW: 13.7 % (ref 11.5–15.5)
WBC: 10.2 10*3/uL (ref 4.0–10.5)

## 2016-10-23 LAB — GLUCOSE, CAPILLARY
GLUCOSE-CAPILLARY: 131 mg/dL — AB (ref 65–99)
GLUCOSE-CAPILLARY: 127 mg/dL — AB (ref 65–99)
Glucose-Capillary: 94 mg/dL (ref 65–99)
Glucose-Capillary: 106 mg/dL — ABNORMAL HIGH (ref 65–99)

## 2016-10-23 LAB — PROTIME-INR
INR: 1.37
PROTHROMBIN TIME: 17 s — AB (ref 11.4–15.2)

## 2016-10-23 MED ORDER — METOPROLOL SUCCINATE ER 25 MG PO TB24
25.0000 mg | ORAL_TABLET | Freq: Once | ORAL | Status: AC
  Administered 2016-10-23: 25 mg via ORAL
  Filled 2016-10-23: qty 1

## 2016-10-23 MED ORDER — WARFARIN SODIUM 2 MG PO TABS
4.0000 mg | ORAL_TABLET | Freq: Once | ORAL | Status: AC
  Administered 2016-10-23: 4 mg via ORAL
  Filled 2016-10-23: qty 2

## 2016-10-23 MED ORDER — DILTIAZEM HCL 60 MG PO TABS
60.0000 mg | ORAL_TABLET | Freq: Three times a day (TID) | ORAL | Status: DC
  Administered 2016-10-23 – 2016-10-24 (×3): 60 mg via ORAL
  Filled 2016-10-23 (×3): qty 1

## 2016-10-23 MED ORDER — FUROSEMIDE 40 MG PO TABS
40.0000 mg | ORAL_TABLET | Freq: Every day | ORAL | Status: DC
  Administered 2016-10-23: 40 mg via ORAL
  Filled 2016-10-23: qty 1

## 2016-10-23 MED ORDER — METOPROLOL SUCCINATE ER 50 MG PO TB24: 75.0000 mg | ORAL_TABLET | Freq: Every day | ORAL | Status: DC

## 2016-10-23 MED ORDER — OFF THE BEAT BOOK
Freq: Once | Status: AC
  Administered 2016-10-23: 17:00:00
  Filled 2016-10-23: qty 1

## 2016-10-23 NOTE — Consult Note (Addendum)
Cardiology Consultation:   Patient ID: Brett [name]; [id number]; 03/12/1933   Admit date: 10/22/2016 Date of Consult: 10/23/2016  Primary Care Provider: Will be Dr Maudie Mercury  Primary Cardiologist: Dr Grace Bushy   Patient Profile:   Brett [name] who is being seen today for the evaluation of AF with RVR at the request of [place]on.  History of Present Illness:   Brett [name] is an 83 y/o followed by Dr Grace Bushy with a history of permanent atrial fibrillation, on chronic Coumadin Rx, and CAD. He underwent PCI of large 1st diagonal on 08/25/2007 with balloon angioplasty (no stent placed). Had 30-40% mid RCA disease and 20-30% mid LAD disease. LCx with only luminal irregularities, performed by Dr. Chad Cordial in [place], [place]. He last saw Dr Grace Bushy in the office May 2018 and was doing well.  He was recently admitted over July 4 th with abdominal pain. He was diagnosed with choledocholithiasis and underwent an [name] 10/17/16 with removal of multiple stones. He had a previous laparoscopic cholecystectomy in 2015. During this recent admission his INR was elevated (LFTs were elevated on adm) and he was given Vit K. He also had hypotension and his beta blocker was decreased and Diltiazem held. He was taken off Zestoretic. He was discharged 7/6 and re admitted 10/22/16 with AF with RVR and we are asked to consult.   The pt says he was doing pretty well at home still a little anorexic. He had resumed his Diltiazem as directed. Yesterday he took his B/P at home and noted his HR was 150. He noted he had been feeling weak. He called the service and was told to go the ED.    Past Medical History:  Diagnosis Date  . Atrial fibrillation ([place])    On coumadin  . Chronic kidney disease (CKD), stage III (moderate)   . Dysrhythmia    AFib  . Essential hypertension, benign   . History of cardiac catheterization    Details not  certain - reportedly no interventions  . Hypothyroidism   . Macular degeneration   . Pulmonary hypertension ([place]) 12/03/2013   PA pressure 56. Ejection fraction 55-60%  . Type 2 diabetes mellitus ([place])     Past Surgical History:  Procedure Laterality Date  . CARDIOVERSION N/A 12/10/2013   Procedure: CARDIOVERSION;  Surgeon: Herminio Commons, MD;  Location: [facility];  Service: Endoscopy;  Laterality: N/A;  . CATARACT EXTRACTION W/PHACO Left 11/13/2015   Procedure: CATARACT EXTRACTION PHACO AND INTRAOCULAR LENS PLACEMENT LEFT EYE CDE=7.76;  Surgeon: Williams Che, MD;  Location: [facility];  Service: Ophthalmology;  Laterality: Left;  . CHOLECYSTECTOMY    . COLONOSCOPY     Remote > 10 years ago  . [name] N/A 10/17/2016   Procedure: ENDOSCOPIC RETROGRADE CHOLANGIOPANCREATOGRAPHY ([name]);  Surgeon: Daneil Dolin, MD;  [facility];  Service: Endoscopy;  Laterality: N/A;  . REMOVAL OF STONES  10/17/2016   Procedure: REMOVAL OF STONES with balloon;  Surgeon: Daneil Dolin, MD;  [facility];  Service: Endoscopy;;  . REPLACEMENT TOTAL HIP W/  RESURFACING IMPLANTS Bilateral   . SPHINCTEROTOMY  10/17/2016   Procedure: BILLARY SPHINCTEROTOMY with ballon dilation;  Surgeon: Daneil Dolin, MD;  [facility];  Service: Endoscopy;;  . TEE WITHOUT CARDIOVERSION N/A 12/10/2013   Procedure: TRANSESOPHAGEAL ECHOCARDIOGRAM (TEE);  Surgeon: Herminio Commons, MD;  Location: [facility];  Service: Endoscopy;  Laterality: N/A;     Inpatient Medications: Scheduled Meds: . aspirin EC  81 mg Oral Daily  . docusate sodium  100 mg Oral BID  . furosemide  40 mg Oral Daily  . insulin aspart  0-15 Units Subcutaneous TID WC  . insulin aspart  0-5 Units Subcutaneous QHS  . levothyroxine  50 mcg Oral QAC breakfast  . metoprolol succinate  50 mg Oral Daily  . warfarin  4 mg Oral Once  . Warfarin - Pharmacist Dosing Inpatient   Does not apply q1800   Continuous Infusions: . diltiazem  (CARDIZEM) infusion 15 mg/hr (10/23/16 0420)  . heparin 1,500 Units/hr (10/23/16 0910)   PRN Meds: metoprolol tartrate, zolpidem  Allergies:   No Known Allergies  Social History:   Social History   Social History  . Marital status: Divorced    Spouse name: N/A  . Number of children: N/A  . Years of education: N/A   Occupational History  . Not on file.   Social History Main Topics  . Smoking status: Former Smoker    Packs/day: 1.00    Years: 37.00    Types: Cigarettes    Start date: 11/01/1952    Quit date: 08/18/1978  . Smokeless tobacco: Never Used  . Alcohol use 0.0 oz/week     Comment: Occasional  . Drug use: No  . Sexual activity: Not Currently   Other Topics Concern  . Not on file   Social History Narrative  . No narrative on file    Family History:   The patient's family history includes Hypertension in his father and mother. There is no history of Colon cancer.  ROS:  Please see the history of present illness.  ROS  All other ROS reviewed and negative.     Physical Exam/Data:   Vitals:   10/23/16 0630 10/23/16 0645 10/23/16 0724 10/23/16 0800  BP: (!) 126/93 (!) 131/97  121/86  Pulse: (!) 119 (!) 113 (!) 138 (!) 138  Resp: 18 15 (!) 24 19  Temp:   97.6 F (36.4 C)   TempSrc:   Oral   SpO2: 98% 98% 99% 98%  Weight:      Height:        Intake/Output Summary (Last 24 hours) at 10/23/16 1042 Last data filed at 10/23/16 0724  Gross per 24 hour  Intake            464.5 ml  Output             1700 ml  Net          -1235.5 ml   Filed Weights   10/22/16 1047 10/23/16 0500  Weight: 213 lb (96.6 kg) 216 lb 4.3 oz (98.1 kg)   Body mass index is 29.33 kg/m.  General:  Well nourished, well developed, in no acute distress HEENT: normal Lymph: no adenopathy Neck: no JVD Endocrine:  No thryomegaly Vascular: No carotid bruits; FA pulses 2+ bilaterally without bruits  Cardiac:   irreg irregno murmur  Lungs:  clear to auscultation bilaterally, no  wheezing, rhonchi or rales  Abd: protuberant, soft, nontender Ext: tace edema Musculoskeletal:  No deformities, BUE and BLE strength normal and equal Skin: warm and dry  Neuro:  CNs 2-12 intact, no focal abnormalities noted Psych:  Normal affect   EKG:  The EKG was personally reviewed and demonstrates:  AF with RVR, RBBB Telemetry:  Telemetry was personally reviewed and demonstrates:  AF 118  Relevant CV Studies:   Laboratory  Data:  Chemistry Recent Labs Lab 10/17/16 0624 10/18/16 0558 10/22/16 1100  NA 127* 130* 136  K 3.7 3.8 3.6  CL 90* 92* 99*  CO2 27 31 28   GLUCOSE 151* 159* 149*  BUN 21* 15 12  CREATININE 0.93 1.03 1.15  CALCIUM 8.3* 8.6* 9.2  GFRNONAA >60 >60 58*  GFRAA >60 >60 >60  ANIONGAP 10 7 9      Recent Labs Lab 10/18/16 0558 10/21/16 0909 10/22/16 1100  PROT 6.1* 6.0* 6.6  ALBUMIN 3.0* 3.5* 3.7  AST 77* 52* 44*  ALT 203* 158* 135*  ALKPHOS 271* 289* 243*  BILITOT 5.2* 2.5* 2.3*   Hematology Recent Labs Lab 10/17/16 0624 10/22/16 1100 10/23/16 0434  WBC 7.0 8.3 10.2  RBC 4.27 4.52 4.24  HGB 12.9* 13.7 12.9*  HCT 37.9* 41.3 39.4  MCV 88.8 91.4 92.9  MCH 30.2 30.3 30.4  MCHC 34.0 33.2 32.7  RDW 13.0 13.7 13.7  PLT 165 275 258   Cardiac Enzymes Recent Labs Lab 10/22/16 1100  TROPONINI <0.03   No results for input(s): TROPIPOC in the last 168 hours.  BNP Recent Labs Lab 10/22/16 1100  BNP 406.0*    DDimer No results for input(s): DDIMER in the last 168 hours.  Radiology/Studies:  Dg Chest Portable 1 View  Result Date: 10/22/2016 CLINICAL DATA:  Weakness.  Atrial fibrillation. EXAM: PORTABLE CHEST 1 VIEW COMPARISON:  PA and lateral chest 08/22/2016. FINDINGS: Calcified left upper lobe granuloma is unchanged. The lungs are otherwise clear. Heart size is normal. No pneumothorax or pleural effusion. No acute bony abnormality. IMPRESSION: No acute disease. Electronically Signed   By: Inge Rise M.D.   On: 10/22/2016 11:24     Assessment and Plan:   1. AF with RVR- will adjust medications and follow  2. Coumadin Rx- recent Vit K for supra therapeutic INR in the setting of elevated LFTS. He no longer has a PCP but is scheduled to see Dr Maudie Mercury. We may want to have our Coumadin clinic follow his INR till this gets stabilized. He is currently on Heparin IV.  3. CAD- s/p prior PCI. Currently stable.  4. Mild CHF- secondary to AF with RVR- his last EF in 2015 was 50-55% by echo  5. S/P recent [name]- 10/17/16  Plan: MD to see. He is back on Toprol 50 mg (home dose). B/P is stable. Transition to PO Diltiazem (home dose was 120 mg daily).    Angelena Form, PA-C  10/23/2016 10:42 AM   Patient seen and discussed with PA Rosalyn Gess, I agree with his documentation. 83 yo male history of CAD with PCI of large 1st diagonal on 08/25/2007 with balloon angioplasty (no stent placed). Had 30-40% mid RCA disease and 20-30% mid LAD disease. LCx with only luminal irregularities, performed by Dr. Chad Cordial in [place], [place]. Also history of chronic diastolic HF,permanent afib. He has recently been off coumadin for [name] and sphincertomy for recent admission with symptomatic gallstones. Presents with generalized fatigue. In Er found to be in afib with RVR, cardiology has been consulted. He reports medication compliance.  Started on IV dilt, continued on his Toprol XL 50mg   WBC 8.3 Hgb 13.7 Plt 275 K 3.6 Cr 1.15 , BNP 406, INR 1.26  Trop neg x 1 CXR no acute process EKG afib RVR, RBBB  Patient with permanent afib admitted with afib with RVR. We will increase his Toprol XL to 75mg  daily, start short acting dilt 60mg  PO q 8 hrs with old  parameters, work to wean dilt drip. Consolidate to long acting dilt once titration complete. Continue heparin drip and coumadin, INR subtherapeutic. Mild fluid overload, likely rate related. Would continue oral lasix at this time, if persists may dose IV tomorrow.    [name] MD

## 2016-10-23 NOTE — Progress Notes (Signed)
PROGRESS NOTE    Brett [name]  ZOX:[id number] DOB: 1933/03/04 DOA: 10/22/2016 PCP: Patient, No Pcp Per    Brief Narrative:  83 year old male who was recently discharged from hospital after being treated for choledocholithiasis status post biliary sphincterotomy and stone extraction. During that hospitalization, his blood pressure was noted to be running low and his rate control medications were held. He was readmitted to the hospital with atrial fibrillation. Currently on Cardizem infusion as well as oral Toprol. He is anticoagulated with heparin. Cardiology consulted.   Assessment & Plan:   Active Problems:   Type 2 diabetes mellitus with diabetic nephropathy (HCC)   Hypertension   Atrial fibrillation with RVR (HCC)   Chronic kidney disease (CKD), stage III (moderate)   Hypothyroidism   Chronic diastolic heart failure (HCC)   HLD (hyperlipidemia)   1. Chronic atrial fibrillation with a ventricular response. Possibly precipitated by discontinuation of rate control medications during last hospitalization due to hypotension. Currently on Cardizem infusion at 15 mg per hour as well as Toprol. He remains tachycardic with heart rate in the 120s to 140s. Last episode in 2015 required DC cardioversion. Will request cardiology input. He is anticoagulated with heparin. 2. Diabetes. Hold metformin. Continue on sliding scale insulin. 3. Hypertension. Blood pressures currently stable. Continue current treatments. 4. Chronic kidney disease stage III. Creatinine currently stable. Continue to monitor. 5. Chronic diastolic congestive heart failure. He does have some evidence of lower extremity edema. Chest x-ray did not show any evidence of pulmonary edema. Will start on oral Lasix. 6. Hypothyroidism. Continue Synthroid   DVT prophylaxis: heparin infusion Code Status: full code Family Communication: no family present Disposition Plan: discharge home once improved   Consultants:      Procedures:     Antimicrobials:       Subjective: No shortness of breath  Objective: Vitals:   10/23/16 0630 10/23/16 0645 10/23/16 0724 10/23/16 0800  BP: (!) 126/93 (!) 131/97  121/86  Pulse: (!) 119 (!) 113 (!) 138 (!) 138  Resp: 18 15 (!) 24 19  Temp:   97.6 F (36.4 C)   TempSrc:   Oral   SpO2: 98% 98% 99% 98%  Weight:      Height:        Intake/Output Summary (Last 24 hours) at 10/23/16 0928 Last data filed at 10/23/16 0724  Gross per 24 hour  Intake            464.5 ml  Output             1700 ml  Net          -1235.5 ml   Filed Weights   10/22/16 1047 10/23/16 0500  Weight: 96.6 kg (213 lb) 98.1 kg (216 lb 4.3 oz)    Examination:  General exam: Appears calm and comfortable  Respiratory system: Clear to auscultation. Respiratory effort normal. Cardiovascular system: S1 & S2 heard, irregular. No JVD, murmurs, rubs, gallops or clicks. 1-2+ pedal edema. Gastrointestinal system: Abdomen is nondistended, soft and nontender. No organomegaly or masses felt. Normal bowel sounds heard. Central nervous system: Alert and oriented. No focal neurological deficits. Extremities: Symmetric 5 x 5 power. Skin: No rashes, lesions or ulcers Psychiatry: Judgement and insight appear normal. Mood & affect appropriate.     Data Reviewed: I have personally reviewed following labs and imaging studies  CBC:  Recent Labs Lab 10/17/16 0624 10/22/16 1100 10/23/16 0434  WBC 7.0 8.3 10.2  NEUTROABS 5.5 6.1  --  HGB 12.9* 13.7 12.9*  HCT 37.9* 41.3 39.4  MCV 88.8 91.4 92.9  PLT 165 275 867   Basic Metabolic Panel:  Recent Labs Lab 10/17/16 0624 10/18/16 0558 10/22/16 1100  NA 127* 130* 136  K 3.7 3.8 3.6  CL 90* 92* 99*  CO2 27 31 28   GLUCOSE 151* 159* 149*  BUN 21* 15 12  CREATININE 0.93 1.03 1.15  CALCIUM 8.3* 8.6* 9.2   GFR: Estimated Creatinine Clearance: 61.1 mL/min (by C-G formula based on SCr of 1.15 mg/dL). Liver Function Tests:  Recent  Labs Lab 10/17/16 0624 10/18/16 0558 10/21/16 0909 10/22/16 1100  AST 96* 77* 52* 44*  ALT 244* 203* 158* 135*  ALKPHOS 240* 271* 289* 243*  BILITOT 6.8* 5.2* 2.5* 2.3*  PROT 6.2* 6.1* 6.0* 6.6  ALBUMIN 3.1* 3.0* 3.5* 3.7   No results for input(s): LIPASE, AMYLASE in the last 168 hours. No results for input(s): AMMONIA in the last 168 hours. Coagulation Profile:  Recent Labs Lab 10/17/16 0624 10/18/16 0558 10/22/16 1202 10/23/16 0434  INR 1.51 1.43 1.26 1.37   Cardiac Enzymes:  Recent Labs Lab 10/22/16 1100  TROPONINI <0.03   BNP (last 3 results) No results for input(s): PROBNP in the last 8760 hours. HbA1C: No results for input(s): HGBA1C in the last 72 hours. CBG:  Recent Labs Lab 10/18/16 0744 10/18/16 1141 10/22/16 1623 10/22/16 2104 10/23/16 0723  GLUCAP 159* 174* 111* 146* 131*   Lipid Profile: No results for input(s): CHOL, HDL, LDLCALC, TRIG, CHOLHDL, LDLDIRECT in the last 72 hours. Thyroid Function Tests: No results for input(s): TSH, T4TOTAL, FREET4, T3FREE, THYROIDAB in the last 72 hours. Anemia Panel: No results for input(s): VITAMINB12, FOLATE, FERRITIN, TIBC, IRON, RETICCTPCT in the last 72 hours. Sepsis Labs: No results for input(s): PROCALCITON, LATICACIDVEN in the last 168 hours.  Recent Results (from the past 240 hour(s))  MRSA PCR Screening     Status: None   Collection Time: 10/22/16  4:15 PM  Result Value Ref Range Status   MRSA by PCR NEGATIVE NEGATIVE Final    Comment:        The GeneXpert MRSA Assay (FDA approved for NASAL specimens only), is one component of a comprehensive MRSA colonization surveillance program. It is not intended to diagnose MRSA infection nor to guide or monitor treatment for MRSA infections.          Radiology Studies: Dg Chest Portable 1 View  Result Date: 10/22/2016 CLINICAL DATA:  Weakness.  Atrial fibrillation. EXAM: PORTABLE CHEST 1 VIEW COMPARISON:  PA and lateral chest 08/22/2016.  FINDINGS: Calcified left upper lobe granuloma is unchanged. The lungs are otherwise clear. Heart size is normal. No pneumothorax or pleural effusion. No acute bony abnormality. IMPRESSION: No acute disease. Electronically Signed   By: Inge Rise M.D.   On: 10/22/2016 11:24        Scheduled Meds: . aspirin EC  81 mg Oral Daily  . docusate sodium  100 mg Oral BID  . insulin aspart  0-15 Units Subcutaneous TID WC  . insulin aspart  0-5 Units Subcutaneous QHS  . levothyroxine  50 mcg Oral QAC breakfast  . metFORMIN  500 mg Oral BID WC  . metoprolol succinate  50 mg Oral Daily  . warfarin  4 mg Oral Once  . Warfarin - Pharmacist Dosing Inpatient   Does not apply q1800   Continuous Infusions: . diltiazem (CARDIZEM) infusion 15 mg/hr (10/23/16 0420)  . heparin 1,450 Units/hr (10/22/16 2238)  LOS: 1 day    Time spent: 46mins    Tremaine Fuhriman, MD [facility] Pager [phone]  If 7PM-7AM, please contact night-coverage [email or web address] Password [facility] 10/23/2016, 9:28 AM

## 2016-10-23 NOTE — Plan of Care (Signed)
Problem: Skin Integrity: Goal: Risk for impaired skin integrity will decrease Outcome: Completed/Met Date Met: 10/23/16 No skin impairment noted, patient ambulates well w/o assistance and is able to turn & reposition self.

## 2016-10-23 NOTE — Progress Notes (Signed)
[place] for heparin Indication: atrial fibrillation  No Known Allergies  Patient Measurements: Height: 6' (182.9 cm) Weight: 216 lb 4.3 oz (98.1 kg) IBW/kg (Calculated) : 77.6 Heparin Dosing Weight: 96.6 kg  Vital Signs: Temp: 97.7 F (36.5 C) (07/11 1628) Temp Source: Oral (07/11 1628) BP: 115/80 (07/11 1600) Pulse Rate: 97 (07/11 1628)  Labs:  Recent Labs  10/22/16 1100 10/22/16 1202 10/22/16 2125 10/23/16 0434 10/23/16 1612  HGB 13.7  --   --  12.9*  --   HCT 41.3  --   --  39.4  --   PLT 275  --   --  258  --   LABPROT  --  15.9*  --  17.0*  --   INR  --  1.26  --  1.37  --   HEPARINUNFRC  --   --  0.25* <0.10* 0.44  CREATININE 1.15  --   --   --   --   TROPONINI <0.03  --   --   --   --     Estimated Creatinine Clearance: 61.1 mL/min (by C-G formula based on SCr of 1.15 mg/dL).   Medical History: Past Medical History:  Diagnosis Date  . Atrial fibrillation ([place])    On coumadin  . Chronic kidney disease (CKD), stage III (moderate)   . Dysrhythmia    AFib  . Essential hypertension, benign   . History of cardiac catheterization    Details not certain - reportedly no interventions  . Hypothyroidism   . Macular degeneration   . Pulmonary hypertension ([place]) 12/03/2013   PA pressure 56. Ejection fraction 55-60%  . Type 2 diabetes mellitus (HCC)     Medications:  Prescriptions Prior to Admission  Medication Sig Dispense Refill Last Dose  . aspirin EC 81 MG tablet Take 1 tablet (81 mg total) by mouth daily.   10/22/2016 at Unknown time  . diltiazem (CARTIA XT) 120 MG 24 hr capsule Take 1 capsule (120 mg total) by mouth daily. 90 capsule 3 10/21/2016 at Unknown time  . diphenhydrAMINE (BENADRYL) 50 MG tablet Take 50 mg by mouth at bedtime as needed for sleep.   10/21/2016 at Unknown time  . docusate sodium (COLACE) 100 MG capsule Take 100 mg by mouth 2 (two) times daily.   Past Week at Unknown time  . doxazosin (CARDURA) 2  MG tablet Take 1 tablet (2 mg total) by mouth at bedtime. 90 tablet 3 10/21/2016 at Unknown time  . levothyroxine (SYNTHROID, LEVOTHROID) 50 MCG tablet Take 50 mcg by mouth daily.   10/22/2016 at Unknown time  . metFORMIN (GLUCOPHAGE) 500 MG tablet Take 500 mg by mouth 2 (two) times daily with a meal.    10/22/2016 at Unknown time  . metoprolol succinate (TOPROL-XL) 50 MG 24 hr tablet Take 1 tablet (50 mg total) by mouth daily. Take with or immediately following a meal. 90 tablet 3 10/22/2016 at 0600  . warfarin (COUMADIN) 3 MG tablet Take 1 tablet (3 mg total) by mouth daily at 6 PM. 30 tablet 0 10/21/2016 at 2300   Assessment: 83 yo man on coumadin for afib.  He was admitted 10/14/16 with elevated INR and required Vitamin K 5mg  IV on 7/4.  He had ERCP on 7/5 and coumadin was restarted 7/8.  INR today is 1.37.  Heparin bridge will start while INR is subtherapeutic.  Heparin level now therpaeutic.  No bleeding noted.  Goal of Therapy:  INR 2-3 Heparin level  0.3-0.7 units/ml Monitor platelets by anticoagulation protocol: Yes   Plan:  Continue heparin drip at 1500 units/hr Daily HL, CBC, PT/INR. Monitor for bleeding complications  Excell Seltzer Poteet 10/23/2016,5:01 PM

## 2016-10-23 NOTE — Progress Notes (Signed)
Patient has done well this shift. Has sat up OOB in chair most of the day. Denies any chest pain or heart palpitations. Patient continues on Cardizem gtt and Heparin gtt at this time.

## 2016-10-23 NOTE — Plan of Care (Signed)
Problem: Education: Goal: Knowledge of disease or condition will improve Outcome: Completed/Met Date Met: 10/23/16 Patient very knowledgeable about managing his a.fib and keep up with his medications and labs

## 2016-10-23 NOTE — Plan of Care (Signed)
Problem: Safety: Goal: Ability to remain free from injury will improve Outcome: Progressing Bed in lowest position, side rails up, call bell and needed items within reach

## 2016-10-23 NOTE — Progress Notes (Signed)
[place] for heparin>>coumadin Indication: atrial fibrillation  No Known Allergies  Patient Measurements: Height: 6' (182.9 cm) Weight: 216 lb 4.3 oz (98.1 kg) IBW/kg (Calculated) : 77.6 Heparin Dosing Weight: 96.6 kg  Vital Signs: Temp: 97.6 F (36.4 C) (07/11 0724) Temp Source: Oral (07/11 0724) BP: 121/86 (07/11 0800) Pulse Rate: 138 (07/11 0800)  Labs:  Recent Labs  10/22/16 1100 10/22/16 1202 10/22/16 2125 10/23/16 0434  HGB 13.7  --   --  12.9*  HCT 41.3  --   --  39.4  PLT 275  --   --  258  LABPROT  --  15.9*  --  17.0*  INR  --  1.26  --  1.37  HEPARINUNFRC  --   --  0.25* <0.10*  CREATININE 1.15  --   --   --   TROPONINI <0.03  --   --   --     Estimated Creatinine Clearance: 61.1 mL/min (by C-G formula based on SCr of 1.15 mg/dL).   Medical History: Past Medical History:  Diagnosis Date  . Atrial fibrillation ([place])    On coumadin  . Chronic kidney disease (CKD), stage III (moderate)   . Dysrhythmia    AFib  . Essential hypertension, benign   . History of cardiac catheterization    Details not certain - reportedly no interventions  . Hypothyroidism   . Macular degeneration   . Pulmonary hypertension ([place]) 12/03/2013   PA pressure 56. Ejection fraction 55-60%  . Type 2 diabetes mellitus (HCC)     Medications:  Prescriptions Prior to Admission  Medication Sig Dispense Refill Last Dose  . aspirin EC 81 MG tablet Take 1 tablet (81 mg total) by mouth daily.   10/22/2016 at Unknown time  . diltiazem (CARTIA XT) 120 MG 24 hr capsule Take 1 capsule (120 mg total) by mouth daily. 90 capsule 3 10/21/2016 at Unknown time  . diphenhydrAMINE (BENADRYL) 50 MG tablet Take 50 mg by mouth at bedtime as needed for sleep.   10/21/2016 at Unknown time  . docusate sodium (COLACE) 100 MG capsule Take 100 mg by mouth 2 (two) times daily.   Past Week at Unknown time  . doxazosin (CARDURA) 2 MG tablet Take 1 tablet (2 mg total) by  mouth at bedtime. 90 tablet 3 10/21/2016 at Unknown time  . levothyroxine (SYNTHROID, LEVOTHROID) 50 MCG tablet Take 50 mcg by mouth daily.   10/22/2016 at Unknown time  . metFORMIN (GLUCOPHAGE) 500 MG tablet Take 500 mg by mouth 2 (two) times daily with a meal.    10/22/2016 at Unknown time  . metoprolol succinate (TOPROL-XL) 50 MG 24 hr tablet Take 1 tablet (50 mg total) by mouth daily. Take with or immediately following a meal. 90 tablet 3 10/22/2016 at 0600  . warfarin (COUMADIN) 3 MG tablet Take 1 tablet (3 mg total) by mouth daily at 6 PM. 30 tablet 0 10/21/2016 at 2300   Assessment: 83 yo man on coumadin for afib.  He was admitted 10/14/16 with elevated INR and required Vitamin K 5mg  IV on 7/4.  He had ERCP on 7/5 and coumadin was restarted 7/8.  INR today is 1.37.  Heparin bridge will start while INR is subtherapeutic.  Heparin level below goal this AM, infusing properly per RN but did lose IV access last evening for a period of time.  No bleeding noted.  CBC reviewed.   Goal of Therapy:  INR 2-3 Heparin level 0.3-0.7 units/ml  Monitor platelets by anticoagulation protocol: Yes   Plan:   Heparin level  below goal. No Bleeding noted. Increase to 1500 units/hr Check heparin level 6-8 hours Warfarin 4mg  PO x 1 Daily HL, CBC, PT/INR. Monitor for bleeding complications  Pricilla Larsson 10/23/2016,9:02 AM

## 2016-10-24 DIAGNOSIS — I5033 Acute on chronic diastolic (congestive) heart failure: Secondary | ICD-10-CM

## 2016-10-24 LAB — CBC
HCT: 38.7 % — ABNORMAL LOW (ref 39.0–52.0)
HEMOGLOBIN: 12.8 g/dL — AB (ref 13.0–17.0)
MCH: 30.2 pg (ref 26.0–34.0)
MCHC: 33.1 g/dL (ref 30.0–36.0)
MCV: 91.3 fL (ref 78.0–100.0)
Platelets: 278 10*3/uL (ref 150–400)
RBC: 4.24 MIL/uL (ref 4.22–5.81)
RDW: 13.4 % (ref 11.5–15.5)
WBC: 7.1 10*3/uL (ref 4.0–10.5)

## 2016-10-24 LAB — GLUCOSE, CAPILLARY
GLUCOSE-CAPILLARY: 160 mg/dL — AB (ref 65–99)
GLUCOSE-CAPILLARY: 122 mg/dL — AB (ref 65–99)
GLUCOSE-CAPILLARY: 126 mg/dL — AB (ref 65–99)
Glucose-Capillary: 158 mg/dL — ABNORMAL HIGH (ref 65–99)

## 2016-10-24 LAB — BASIC METABOLIC PANEL
Anion gap: 7 (ref 5–15)
BUN: 13 mg/dL (ref 6–20)
CHLORIDE: 97 mmol/L — AB (ref 101–111)
CO2: 31 mmol/L (ref 22–32)
Calcium: 8.7 mg/dL — ABNORMAL LOW (ref 8.9–10.3)
Creatinine, Ser: 1.17 mg/dL (ref 0.61–1.24)
GFR calc Af Amer: >60 mL/min (ref >60)
GFR calc non Af Amer: 57 mL/min — ABNORMAL LOW (ref >60)
GLUCOSE: 131 mg/dL — AB (ref 65–99)
POTASSIUM: 3.8 mmol/L (ref 3.5–5.1)
Sodium: 135 mmol/L (ref 135–145)

## 2016-10-24 LAB — PROTIME-INR
INR: 1.76
Prothrombin Time: 20.7 seconds — ABNORMAL HIGH (ref 11.4–15.2)

## 2016-10-24 LAB — HEPARIN LEVEL (UNFRACTIONATED): Heparin Unfractionated: 0.48 IU/mL (ref 0.30–0.70)

## 2016-10-24 MED ORDER — METOPROLOL SUCCINATE ER 50 MG PO TB24
100.0000 mg | ORAL_TABLET | Freq: Every day | ORAL | Status: DC
  Administered 2016-10-24 – 2016-10-25 (×2): 100 mg via ORAL
  Filled 2016-10-24 (×2): qty 2

## 2016-10-24 MED ORDER — DILTIAZEM HCL 60 MG PO TABS
90.0000 mg | ORAL_TABLET | Freq: Three times a day (TID) | ORAL | Status: DC
  Administered 2016-10-24 – 2016-10-26 (×5): 90 mg via ORAL
  Filled 2016-10-24 (×5): qty 1

## 2016-10-24 MED ORDER — FUROSEMIDE 10 MG/ML IJ SOLN
40.0000 mg | Freq: Once | INTRAMUSCULAR | Status: AC
  Administered 2016-10-24: 40 mg via INTRAVENOUS
  Filled 2016-10-24: qty 4

## 2016-10-24 MED ORDER — WARFARIN SODIUM 2 MG PO TABS
4.0000 mg | ORAL_TABLET | Freq: Once | ORAL | Status: AC
  Administered 2016-10-24: 4 mg via ORAL
  Filled 2016-10-24: qty 2

## 2016-10-24 MED ORDER — DILTIAZEM HCL 30 MG PO TABS
30.0000 mg | ORAL_TABLET | Freq: Once | ORAL | Status: AC
  Administered 2016-10-24: 30 mg via ORAL
  Filled 2016-10-24: qty 1

## 2016-10-24 NOTE — Telephone Encounter (Signed)
Brett [name], I see pt is still in the hospital and looks like expected date of discharge is 10/26/2016. Do you want me to call him first of the week and have him do the labs then?

## 2016-10-24 NOTE — Progress Notes (Signed)
PROGRESS NOTE    Brett [name]  OEU:[id number] DOB: Nov 27, 1932 DOA: 10/22/2016 PCP: Patient, No Pcp Per    Brief Narrative:  83 year old male who was recently discharged from hospital after being treated for choledocholithiasis status post biliary sphincterotomy and stone extraction. During that hospitalization, his blood pressure was noted to be running low and his rate control medications were held. He was readmitted to the hospital with atrial fibrillation. Currently on Cardizem infusion as well as oral Toprol. He is anticoagulated with heparin. Cardiology consulted.   Assessment & Plan:   Active Problems:   Type 2 diabetes mellitus with diabetic nephropathy (HCC)   Hypertension   Atrial fibrillation with RVR (HCC)   Chronic kidney disease (CKD), stage III (moderate)   Hypothyroidism   Chronic diastolic heart failure (HCC)   HLD (hyperlipidemia)   1. Chronic atrial fibrillation with rapid ventricular response. Possibly precipitated by discontinuation of rate control medications during last hospitalization due to hypotension. Currently on Cardizem infusion at 15 mg per hour as well as Toprol. Seen by cardiology and oral cardizem also added to regimen. Will try and wean off cardizem infusion as tolerated. May need to consider digoxin if heart rate does not improve. Last episode in 2015 required DC cardioversion. He is anticoagulated with heparin and coumadin. 2. Diabetes. Hold metformin. Continue on sliding scale insulin. Blood sugars have been stable 3. Hypertension. Blood pressures currently stable. Continue current treatments. 4. Chronic kidney disease stage III. Creatinine currently stable. Continue to monitor. 5. Acute on Chronic diastolic congestive heart failure. He does have some evidence of volume overload. Receiving a dose of IV lasix today. 6. Hypothyroidism. Continue Synthroid   DVT prophylaxis: heparin infusion Code Status: full code Family Communication: no family  present Disposition Plan: discharge home once improved   Consultants:     Procedures:     Antimicrobials:       Subjective: Feeling better today. No shortness of breath or chest pain.  Objective: Vitals:   10/24/16 0700 10/24/16 0716 10/24/16 0800 10/24/16 0900  BP: 93/81 103/79 113/83 100/63  Pulse: (!) 122 (!) 136 (!) 112 84  Resp: 17 (!) 21 19 18   Temp:  97.6 F (36.4 C)    TempSrc:  Axillary    SpO2: 96% 94% 95% 95%  Weight:      Height:        Intake/Output Summary (Last 24 hours) at 10/24/16 0932 Last data filed at 10/24/16 0900  Gross per 24 hour  Intake          1175.42 ml  Output             1500 ml  Net          -324.58 ml   Filed Weights   10/22/16 1047 10/23/16 0500 10/24/16 0459  Weight: 96.6 kg (213 lb) 98.1 kg (216 lb 4.3 oz) 98.6 kg (217 lb 6 oz)    Examination:  General exam: Alert, awake, oriented x 3 Respiratory system: Crackles at bases. Respiratory effort normal. Cardiovascular system: S1 S2 irregular. No murmurs, rubs, gallops. Gastrointestinal system: Abdomen is nondistended, soft and nontender. No organomegaly or masses felt. Normal bowel sounds heard. Central nervous system: Alert and oriented. No focal neurological deficits. Extremities: 1-2+ edema bilaterally Skin: No rashes, lesions or ulcers Psychiatry: Judgement and insight appear normal. Mood & affect appropriate.      Data Reviewed: I have personally reviewed following labs and imaging studies  CBC:  Recent Labs Lab 10/22/16 1100 10/23/16 0434  10/24/16 0423  WBC 8.3 10.2 7.1  NEUTROABS 6.1  --   --   HGB 13.7 12.9* 12.8*  HCT 41.3 39.4 38.7*  MCV 91.4 92.9 91.3  PLT 275 258 500   Basic Metabolic Panel:  Recent Labs Lab 10/18/16 0558 10/22/16 1100 10/24/16 0423  NA 130* 136 135  K 3.8 3.6 3.8  CL 92* 99* 97*  CO2 31 28 31   GLUCOSE 159* 149* 131*  BUN 15 12 13   CREATININE 1.03 1.15 1.17  CALCIUM 8.6* 9.2 8.7*   GFR: Estimated Creatinine  Clearance: 60.2 mL/min (by C-G formula based on SCr of 1.17 mg/dL). Liver Function Tests:  Recent Labs Lab 10/18/16 0558 10/21/16 0909 10/22/16 1100  AST 77* 52* 44*  ALT 203* 158* 135*  ALKPHOS 271* 289* 243*  BILITOT 5.2* 2.5* 2.3*  PROT 6.1* 6.0* 6.6  ALBUMIN 3.0* 3.5* 3.7   No results for input(s): LIPASE, AMYLASE in the last 168 hours. No results for input(s): AMMONIA in the last 168 hours. Coagulation Profile:  Recent Labs Lab 10/18/16 0558 10/22/16 1202 10/23/16 0434 10/24/16 0423  INR 1.43 1.26 1.37 1.76   Cardiac Enzymes:  Recent Labs Lab 10/22/16 1100  TROPONINI <0.03   BNP (last 3 results) No results for input(s): PROBNP in the last 8760 hours. HbA1C: No results for input(s): HGBA1C in the last 72 hours. CBG:  Recent Labs Lab 10/23/16 0723 10/23/16 1132 10/23/16 1619 10/23/16 2100 10/24/16 0721  GLUCAP 131* 127* 94 106* 160*   Lipid Profile: No results for input(s): CHOL, HDL, LDLCALC, TRIG, CHOLHDL, LDLDIRECT in the last 72 hours. Thyroid Function Tests: No results for input(s): TSH, T4TOTAL, FREET4, T3FREE, THYROIDAB in the last 72 hours. Anemia Panel: No results for input(s): VITAMINB12, FOLATE, FERRITIN, TIBC, IRON, RETICCTPCT in the last 72 hours. Sepsis Labs: No results for input(s): PROCALCITON, LATICACIDVEN in the last 168 hours.  Recent Results (from the past 240 hour(s))  MRSA PCR Screening     Status: None   Collection Time: 10/22/16  4:15 PM  Result Value Ref Range Status   MRSA by PCR NEGATIVE NEGATIVE Final    Comment:        The GeneXpert MRSA Assay (FDA approved for NASAL specimens only), is one component of a comprehensive MRSA colonization surveillance program. It is not intended to diagnose MRSA infection nor to guide or monitor treatment for MRSA infections.          Radiology Studies: Dg Chest Portable 1 View  Result Date: 10/22/2016 CLINICAL DATA:  Weakness.  Atrial fibrillation. EXAM: PORTABLE CHEST 1  VIEW COMPARISON:  PA and lateral chest 08/22/2016. FINDINGS: Calcified left upper lobe granuloma is unchanged. The lungs are otherwise clear. Heart size is normal. No pneumothorax or pleural effusion. No acute bony abnormality. IMPRESSION: No acute disease. Electronically Signed   By: Inge Rise M.D.   On: 10/22/2016 11:24        Scheduled Meds: . aspirin EC  81 mg Oral Daily  . diltiazem  60 mg Oral Q8H  . docusate sodium  100 mg Oral BID  . furosemide  40 mg Intravenous Once  . insulin aspart  0-15 Units Subcutaneous TID WC  . insulin aspart  0-5 Units Subcutaneous QHS  . levothyroxine  50 mcg Oral QAC breakfast  . metoprolol succinate  100 mg Oral Daily  . warfarin  4 mg Oral Once  . Warfarin - Pharmacist Dosing Inpatient   Does not apply q1800   Continuous Infusions: .  diltiazem (CARDIZEM) infusion 15 mg/hr (10/24/16 0900)  . heparin 1,500 Units/hr (10/24/16 0900)     LOS: 2 days    Time spent: 73mins    Chelsey Redondo, MD [facility] Pager [phone]  If 7PM-7AM, please contact night-coverage [email or web address] Password [facility] 10/24/2016, 9:32 AM

## 2016-10-24 NOTE — Progress Notes (Signed)
Progress Note  Patient Name: Brett [name] Date of Encounter: 10/24/2016   Subjective   No complaints.   Inpatient Medications    Scheduled Meds: . aspirin EC  81 mg Oral Daily  . diltiazem  60 mg Oral Q8H  . docusate sodium  100 mg Oral BID  . furosemide  40 mg Oral Daily  . insulin aspart  0-15 Units Subcutaneous TID WC  . insulin aspart  0-5 Units Subcutaneous QHS  . levothyroxine  50 mcg Oral QAC breakfast  . metoprolol succinate  75 mg Oral Daily  . Warfarin - Pharmacist Dosing Inpatient   Does not apply q1800   Continuous Infusions: . diltiazem (CARDIZEM) infusion 15 mg/hr (10/24/16 3557)  . heparin 1,500 Units/hr (10/23/16 1754)   PRN Meds: metoprolol tartrate, zolpidem   Vital Signs    Vitals:   10/24/16 0545 10/24/16 0600 10/24/16 0615 10/24/16 0716  BP: 99/61 118/89 117/87 103/79  Pulse: (!) 122 (!) 110 (!) 120 (!) 136  Resp: 19 19 (!) 21 (!) 21  Temp:    97.6 F (36.4 C)  TempSrc:    Axillary  SpO2: 92% 94% 96% 94%  Weight:      Height:        Intake/Output Summary (Last 24 hours) at 10/24/16 0804 Last data filed at 10/24/16 3220  Gross per 24 hour  Intake           725.42 ml  Output             1500 ml  Net          -774.58 ml   Filed Weights   10/22/16 1047 10/23/16 0500 10/24/16 0459  Weight: 213 lb (96.6 kg) 216 lb 4.3 oz (98.1 kg) 217 lb 6 oz (98.6 kg)    Telemetry     - Personally Reviewed  ECG     - Personally Reviewed  Physical Exam   GEN: No acute distress.   Neck: elevated JVD Cardiac: irreg, no m/r/g Respiratory: Clear to auscultation bilaterally. GI: Soft, nontender, non-distended  MS: No edema; No deformity. Neuro:  Nonfocal  Psych: Normal affect   Labs    Chemistry Recent Labs Lab 10/18/16 0558 10/21/16 0909 10/22/16 1100 10/24/16 0423  NA 130*  --  136 135  K 3.8  --  3.6 3.8  CL 92*  --  99* 97*  CO2 31  --  28 31  GLUCOSE 159*  --  149* 131*  BUN 15  --  12 13  CREATININE 1.03  --  1.15 1.17    CALCIUM 8.6*  --  9.2 8.7*  PROT 6.1* 6.0* 6.6  --   ALBUMIN 3.0* 3.5* 3.7  --   AST 77* 52* 44*  --   ALT 203* 158* 135*  --   ALKPHOS 271* 289* 243*  --   BILITOT 5.2* 2.5* 2.3*  --   GFRNONAA >60  --  58* 57*  GFRAA >60  --  >60 >60  ANIONGAP 7  --  9 7     Hematology Recent Labs Lab 10/22/16 1100 10/23/16 0434 10/24/16 0423  WBC 8.3 10.2 7.1  RBC 4.52 4.24 4.24  HGB 13.7 12.9* 12.8*  HCT 41.3 39.4 38.7*  MCV 91.4 92.9 91.3  MCH 30.3 30.4 30.2  MCHC 33.2 32.7 33.1  RDW 13.7 13.7 13.4  PLT 275 258 278    Cardiac Enzymes Recent Labs Lab 10/22/16 1100  TROPONINI <0.03   No results  for input(s): TROPIPOC in the last 168 hours.   BNP Recent Labs Lab 10/22/16 1100  BNP 406.0*     DDimer No results for input(s): DDIMER in the last 168 hours.   Radiology    Dg Chest Portable 1 View  Result Date: 10/22/2016 CLINICAL DATA:  Weakness.  Atrial fibrillation. EXAM: PORTABLE CHEST 1 VIEW COMPARISON:  PA and lateral chest 08/22/2016. FINDINGS: Calcified left upper lobe granuloma is unchanged. The lungs are otherwise clear. Heart size is normal. No pneumothorax or pleural effusion. No acute bony abnormality. IMPRESSION: No acute disease. Electronically Signed   By: Inge Rise M.D.   On: 10/22/2016 11:24    Cardiac Studies    Patient Profile     83 yo male history of CAD with PCI of large 1st diagonal on 08/25/2007 with balloon angioplasty (no stent placed). Had 30-40% mid RCA disease and 20-30% mid LAD disease. LCx with only luminal irregularities, performed by Dr. Chad Cordial in [place], [place]. Also history of chronic diastolic HF,permanent afib. He has recently been off coumadin for ERCP and sphincertomy for recent admission with symptomatic gallstones. Presents with generalized fatigue. In Er found to be in afib with RVR, cardiology has been consulted. He reports medication compliance. Started on IV dilt, continued on his Toprol XL 50mg   Assessment & Plan     1. Afib with RVR - Toprol XL increased to 75mg  daily. Started on dilt 60mg  po q8hrs with goal to wean dilt gtt - rates remain elevated, remains on dilt gtt - we will increase Toprol XL to 100mg  daily, continue dilt 60mg  tid. Wean dilt drip to keep heart rates <110 - conitnue heparin and coumadin - soft bp's at times but overall tolerating, may consider digoxin loading if bp more of an issue  - f/u heart rates in afternoon   2. Acute on chronic diastolic HF - evidence of volume overloaded, will dose IV lasix 40mg  x 1 today.   Merrily Pew, MD  10/24/2016, 8:04 AM

## 2016-10-24 NOTE — Care Management Note (Signed)
Case Management Note  Patient Details  Name: Brett [name] MRN: [id number] Date of Birth: [birth year]  Subjective/Objective:     Pt admitted with afib. He is from home,  is ind with ADL's. Pt has no DME or HH needs pta. He is not homebound, drives himself to appointments. He has an appt. Scheduled with Dr. Maudie Mercury to establish care. He has insurance with prescription coverage.    Action/Plan: Plans to return home with self care. CM following.   Expected Discharge Date:       10/26/2016           Expected Discharge Plan:  Home/Self Care  In-House Referral:     Discharge planning Services  CM Consult  Post Acute Care Choice:  NA Choice offered to:  NA  DME Arranged:    DME Agency:     HH Arranged:    HH Agency:     Status of Service:  In process, will continue to follow  If discussed at Long Length of Stay Meetings, dates discussed:    Additional Comments:  Heaven Meeker, Chauncey Reading, RN 10/24/2016, 8:46 AM

## 2016-10-24 NOTE — Telephone Encounter (Signed)
Looks like he was readmitted on 7/10 for non-GI reasons. Will need to check when he is discharged. Would put on recall for 2 weeks.

## 2016-10-24 NOTE — Progress Notes (Signed)
[place] for heparin Indication: atrial fibrillation  No Known Allergies  Patient Measurements: Height: 6' (182.9 cm) Weight: 217 lb 6 oz (98.6 kg) IBW/kg (Calculated) : 77.6 Heparin Dosing Weight: 96.6 kg  Vital Signs: Temp: 97.6 F (36.4 C) (07/12 0716) Temp Source: Axillary (07/12 0716) BP: 103/79 (07/12 0716) Pulse Rate: 136 (07/12 0716)  Labs:  Recent Labs  10/22/16 1100 10/22/16 1202  10/23/16 0434 10/23/16 1612 10/24/16 0423  HGB 13.7  --   --  12.9*  --  12.8*  HCT 41.3  --   --  39.4  --  38.7*  PLT 275  --   --  258  --  278  LABPROT  --  15.9*  --  17.0*  --  20.7*  INR  --  1.26  --  1.37  --  1.76  HEPARINUNFRC  --   --   < > <0.10* 0.44 0.48  CREATININE 1.15  --   --   --   --  1.17  TROPONINI <0.03  --   --   --   --   --   < > = values in this interval not displayed.  Estimated Creatinine Clearance: 60.2 mL/min (by C-G formula based on SCr of 1.17 mg/dL).  Medical History: Past Medical History:  Diagnosis Date  . Atrial fibrillation ([place])    On coumadin  . Chronic kidney disease (CKD), stage III (moderate)   . Dysrhythmia    AFib  . Essential hypertension, benign   . History of cardiac catheterization    Details not certain - reportedly no interventions  . Hypothyroidism   . Macular degeneration   . Pulmonary hypertension ([place]) 12/03/2013   PA pressure 56. Ejection fraction 55-60%  . Type 2 diabetes mellitus (HCC)     Medications:  Prescriptions Prior to Admission  Medication Sig Dispense Refill Last Dose  . aspirin EC 81 MG tablet Take 1 tablet (81 mg total) by mouth daily.   10/22/2016 at Unknown time  . diltiazem (CARTIA XT) 120 MG 24 hr capsule Take 1 capsule (120 mg total) by mouth daily. 90 capsule 3 10/21/2016 at Unknown time  . diphenhydrAMINE (BENADRYL) 50 MG tablet Take 50 mg by mouth at bedtime as needed for sleep.   10/21/2016 at Unknown time  . docusate sodium (COLACE) 100 MG capsule Take 100  mg by mouth 2 (two) times daily.   Past Week at Unknown time  . doxazosin (CARDURA) 2 MG tablet Take 1 tablet (2 mg total) by mouth at bedtime. 90 tablet 3 10/21/2016 at Unknown time  . levothyroxine (SYNTHROID, LEVOTHROID) 50 MCG tablet Take 50 mcg by mouth daily.   10/22/2016 at Unknown time  . metFORMIN (GLUCOPHAGE) 500 MG tablet Take 500 mg by mouth 2 (two) times daily with a meal.    10/22/2016 at Unknown time  . metoprolol succinate (TOPROL-XL) 50 MG 24 hr tablet Take 1 tablet (50 mg total) by mouth daily. Take with or immediately following a meal. 90 tablet 3 10/22/2016 at 0600  . warfarin (COUMADIN) 3 MG tablet Take 1 tablet (3 mg total) by mouth daily at 6 PM. 30 tablet 0 10/21/2016 at 2300   Assessment: 83 yo man on coumadin for afib.  He was admitted 10/14/16 with elevated INR and required Vitamin K 5mg  IV on 7/4.  He had ERCP on 7/5 and coumadin was restarted 7/8.  INR today is 1.37.  Heparin bridge will start while INR  is subtherapeutic.  Heparin level now therapeutic.  INR still below goal.  No bleeding noted.  Goal of Therapy:  INR 2-3 Heparin level 0.3-0.7 units/ml Monitor platelets by anticoagulation protocol: Yes   Plan:  Continue heparin drip at 1500 units/hr Coumadin 4mg  today x 1 Daily HL, CBC, PT/INR. Monitor for bleeding complications  Hart Robinsons A 10/24/2016,8:21 AM

## 2016-10-25 LAB — GLUCOSE, CAPILLARY
Glucose-Capillary: 129 mg/dL — ABNORMAL HIGH (ref 65–99)
Glucose-Capillary: 178 mg/dL — ABNORMAL HIGH (ref 65–99)
Glucose-Capillary: 106 mg/dL — ABNORMAL HIGH (ref 65–99)
Glucose-Capillary: 114 mg/dL — ABNORMAL HIGH (ref 65–99)

## 2016-10-25 LAB — BASIC METABOLIC PANEL
Anion gap: 8 (ref 5–15)
BUN: 20 mg/dL (ref 6–20)
CALCIUM: 8.6 mg/dL — AB (ref 8.9–10.3)
CO2: 31 mmol/L (ref 22–32)
CREATININE: 1.28 mg/dL — AB (ref 0.61–1.24)
Chloride: 95 mmol/L — ABNORMAL LOW (ref 101–111)
GFR calc Af Amer: 59 mL/min — ABNORMAL LOW (ref >60)
GFR calc non Af Amer: 51 mL/min — ABNORMAL LOW (ref >60)
GLUCOSE: 135 mg/dL — AB (ref 65–99)
Potassium: 3.7 mmol/L (ref 3.5–5.1)
Sodium: 134 mmol/L — ABNORMAL LOW (ref 135–145)

## 2016-10-25 LAB — CBC
HEMATOCRIT: 37.6 % — AB (ref 39.0–52.0)
Hemoglobin: 12.5 g/dL — ABNORMAL LOW (ref 13.0–17.0)
MCH: 30.3 pg (ref 26.0–34.0)
MCHC: 33.2 g/dL (ref 30.0–36.0)
MCV: 91.3 fL (ref 78.0–100.0)
Platelets: 295 10*3/uL (ref 150–400)
RBC: 4.12 MIL/uL — ABNORMAL LOW (ref 4.22–5.81)
RDW: 13.5 % (ref 11.5–15.5)
WBC: 7 10*3/uL (ref 4.0–10.5)

## 2016-10-25 LAB — PROTIME-INR
INR: 2.2
PROTHROMBIN TIME: 24.8 s — AB (ref 11.4–15.2)

## 2016-10-25 LAB — HEPARIN LEVEL (UNFRACTIONATED): Heparin Unfractionated: 0.52 IU/mL (ref 0.30–0.70)

## 2016-10-25 MED ORDER — METOPROLOL SUCCINATE ER 50 MG PO TB24
150.0000 mg | ORAL_TABLET | Freq: Every day | ORAL | Status: DC
  Administered 2016-10-26: 150 mg via ORAL
  Filled 2016-10-25: qty 3

## 2016-10-25 MED ORDER — WARFARIN SODIUM 2 MG PO TABS
3.0000 mg | ORAL_TABLET | Freq: Once | ORAL | Status: AC
  Administered 2016-10-25: 3 mg via ORAL
  Filled 2016-10-25: qty 1

## 2016-10-25 MED ORDER — METOPROLOL SUCCINATE ER 50 MG PO TB24
50.0000 mg | ORAL_TABLET | Freq: Once | ORAL | Status: AC
  Administered 2016-10-25: 50 mg via ORAL
  Filled 2016-10-25: qty 1

## 2016-10-25 MED ORDER — METOPROLOL TARTRATE 5 MG/5ML IV SOLN
5.0000 mg | INTRAVENOUS | Status: AC | PRN
  Administered 2016-10-25 – 2016-10-29 (×2): 5 mg via INTRAVENOUS
  Filled 2016-10-25 (×2): qty 5

## 2016-10-25 MED ORDER — SENNOSIDES-DOCUSATE SODIUM 8.6-50 MG PO TABS
2.0000 | ORAL_TABLET | Freq: Once | ORAL | Status: AC
  Administered 2016-10-25: 2 via ORAL
  Filled 2016-10-25: qty 2

## 2016-10-25 NOTE — Progress Notes (Signed)
PROGRESS NOTE    Brett [name]  DGU:[id number] DOB: [birth year] DOA: 10/22/2016 PCP: Patient, No Pcp Per    Brief Narrative:  83 year old male who was recently discharged from hospital after being treated for choledocholithiasis status post biliary sphincterotomy and stone extraction. During that hospitalization, his blood pressure was noted to be running low and his rate control medications were held. He was readmitted to the hospital with atrial fibrillation. Currently, rate control medications are being adjusted. He is anticoagulated with coumadin. Cardiology following   Assessment & Plan:   Active Problems:   Type 2 diabetes mellitus with diabetic nephropathy (HCC)   Hypertension   Atrial fibrillation with RVR (HCC)   Chronic kidney disease (CKD), stage III (moderate)   Hypothyroidism   HLD (hyperlipidemia)   Acute on chronic diastolic CHF (congestive heart failure) ([place])   1. Chronic atrial fibrillation with rapid ventricular response. Possibly precipitated by discontinuation of rate control medications during last hospitalization due to hypotension. Currently on oral cardizem and toprol. he was able to wean off cardizem infusion yesterday. Heart rate remains uncontrolled today, so toprol and oral cardizem are being adjusted. Cardiology following. May need to consider digoxin if heart rate does not improve. INR is therapeutic on coumadin. Heparin infusion discontinued. 2. Diabetes. Hold metformin. Continue on sliding scale insulin. Blood sugars have been stable 3. Hypertension. Blood pressures currently stable. Continue current treatments. 4. Chronic kidney disease stage III. Creatinine mildly trended up yesterday with lasix. Continue to monitor. 5. Acute on Chronic diastolic congestive heart failure. Treated with IV lasix yesterday and creatinine has trended up today. Holding further diuretics today. Can resume oral lasix tomorrow if renal function remains  stable. 6. Hypothyroidism. Continue Synthroid   DVT prophylaxis: coumadin Code Status: full code Family Communication: no family present Disposition Plan: discharge home once improved   Consultants:   cardiology  Procedures:     Antimicrobials:       Subjective: No shortness of breath. No chest pain. No palpitations  Objective: Vitals:   10/25/16 0553 10/25/16 0600 10/25/16 0700 10/25/16 0800  BP:  (!) 121/98 (!) 128/91 107/76  Pulse: (!) 124 (!) 124 (!) 123 (!) 117  Resp: 18 20 20  (!) 21  Temp:    97.9 F (36.6 C)  TempSrc:    Oral  SpO2: 92% 94% 95% (!) 89%  Weight:      Height:        Intake/Output Summary (Last 24 hours) at 10/25/16 1038 Last data filed at 10/25/16 0900  Gross per 24 hour  Intake             1770 ml  Output             1900 ml  Net             -130 ml   Filed Weights   10/23/16 0500 10/24/16 0459 10/25/16 0500  Weight: 98.1 kg (216 lb 4.3 oz) 98.6 kg (217 lb 6 oz) 98.1 kg (216 lb 4.3 oz)    Examination:  General exam: Alert, awake, oriented x 3 Respiratory system: Clear to auscultation. Respiratory effort normal. Cardiovascular system:S1 S2 irregular. No murmurs, rubs, gallops. Gastrointestinal system: Abdomen is nondistended, soft and nontender. No organomegaly or masses felt. Normal bowel sounds heard. Central nervous system: Alert and oriented. No focal neurological deficits. Extremities: 1+ edema bilaterally Skin: No rashes, lesions or ulcers Psychiatry: Judgement and insight appear normal. Mood & affect appropriate.       Data Reviewed: I  have personally reviewed following labs and imaging studies  CBC:  Recent Labs Lab 10/22/16 1100 10/23/16 0434 10/24/16 0423 10/25/16 0452  WBC 8.3 10.2 7.1 7.0  NEUTROABS 6.1  --   --   --   HGB 13.7 12.9* 12.8* 12.5*  HCT 41.3 39.4 38.7* 37.6*  MCV 91.4 92.9 91.3 91.3  PLT 275 258 278 782   Basic Metabolic Panel:  Recent Labs Lab 10/22/16 1100 10/24/16 0423  10/25/16 0452  NA 136 135 134*  K 3.6 3.8 3.7  CL 99* 97* 95*  CO2 28 31 31   GLUCOSE 149* 131* 135*  BUN 12 13 20   CREATININE 1.15 1.17 1.28*  CALCIUM 9.2 8.7* 8.6*   GFR: Estimated Creatinine Clearance: 54.9 mL/min (A) (by C-G formula based on SCr of 1.28 mg/dL (H)). Liver Function Tests:  Recent Labs Lab 10/21/16 0909 10/22/16 1100  AST 52* 44*  ALT 158* 135*  ALKPHOS 289* 243*  BILITOT 2.5* 2.3*  PROT 6.0* 6.6  ALBUMIN 3.5* 3.7   No results for input(s): LIPASE, AMYLASE in the last 168 hours. No results for input(s): AMMONIA in the last 168 hours. Coagulation Profile:  Recent Labs Lab 10/22/16 1202 10/23/16 0434 10/24/16 0423 10/25/16 0452  INR 1.26 1.37 1.76 2.20   Cardiac Enzymes:  Recent Labs Lab 10/22/16 1100  TROPONINI <0.03   BNP (last 3 results) No results for input(s): PROBNP in the last 8760 hours. HbA1C: No results for input(s): HGBA1C in the last 72 hours. CBG:  Recent Labs Lab 10/24/16 0721 10/24/16 1112 10/24/16 1625 10/24/16 2139 10/25/16 0743  GLUCAP 160* 158* 122* 126* 129*   Lipid Profile: No results for input(s): CHOL, HDL, LDLCALC, TRIG, CHOLHDL, LDLDIRECT in the last 72 hours. Thyroid Function Tests: No results for input(s): TSH, T4TOTAL, FREET4, T3FREE, THYROIDAB in the last 72 hours. Anemia Panel: No results for input(s): VITAMINB12, FOLATE, FERRITIN, TIBC, IRON, RETICCTPCT in the last 72 hours. Sepsis Labs: No results for input(s): PROCALCITON, LATICACIDVEN in the last 168 hours.  Recent Results (from the past 240 hour(s))  MRSA PCR Screening     Status: None   Collection Time: 10/22/16  4:15 PM  Result Value Ref Range Status   MRSA by PCR NEGATIVE NEGATIVE Final    Comment:        The GeneXpert MRSA Assay (FDA approved for NASAL specimens only), is one component of a comprehensive MRSA colonization surveillance program. It is not intended to diagnose MRSA infection nor to guide or monitor treatment for MRSA  infections.          Radiology Studies: No results found.      Scheduled Meds: . aspirin EC  81 mg Oral Daily  . diltiazem  90 mg Oral Q8H  . docusate sodium  100 mg Oral BID  . insulin aspart  0-15 Units Subcutaneous TID WC  . insulin aspart  0-5 Units Subcutaneous QHS  . levothyroxine  50 mcg Oral QAC breakfast  . [START ON 10/26/2016] metoprolol succinate  150 mg Oral Daily  . metoprolol succinate  50 mg Oral Once  . warfarin  3 mg Oral Once  . Warfarin - Pharmacist Dosing Inpatient   Does not apply q1800   Continuous Infusions: . diltiazem (CARDIZEM) infusion Stopped (10/24/16 1300)     LOS: 3 days    Time spent: 76mins    Idaly Verret, MD [facility] Pager [phone]  If 7PM-7AM, please contact night-coverage [email or web address] Password [facility] 10/25/2016, 10:38 AM

## 2016-10-25 NOTE — Plan of Care (Signed)
Problem: Fluid Volume: Goal: Ability to maintain a balanced intake and output will improve Outcome: Progressing Patient progressing

## 2016-10-25 NOTE — Progress Notes (Signed)
Dr. Myna Hidalgo text paged for notification of patient's HR being 120s-130s per tele. Metoprolol given at 0350. HR remains 110-127. Metoprolol IV order is complete. Cardizem PO due at 0600. Verification whether or not Metoprolol IV is needed. Dr. Myna Hidalgo responded, stating to give Metoprolol IV and order is renewed.

## 2016-10-25 NOTE — Progress Notes (Signed)
Progress Note  Patient Name: VONNIE LIGMAN Date of Encounter: 10/25/2016   Subjective   No compliants  Inpatient Medications    Scheduled Meds: . aspirin EC  81 mg Oral Daily  . diltiazem  90 mg Oral Q8H  . docusate sodium  100 mg Oral BID  . insulin aspart  0-15 Units Subcutaneous TID WC  . insulin aspart  0-5 Units Subcutaneous QHS  . levothyroxine  50 mcg Oral QAC breakfast  . metoprolol succinate  100 mg Oral Daily  . Warfarin - Pharmacist Dosing Inpatient   Does not apply q1800   Continuous Infusions: . diltiazem (CARDIZEM) infusion Stopped (10/24/16 1300)  . heparin 1,500 Units/hr (10/25/16 0700)   PRN Meds: metoprolol tartrate, zolpidem   Vital Signs    Vitals:   10/25/16 0553 10/25/16 0600 10/25/16 0700 10/25/16 0800  BP:  (!) 121/98 (!) 128/91 107/76  Pulse: (!) 124 (!) 124 (!) 123 (!) 117  Resp: 18 20 20  (!) 21  Temp:    97.9 F (36.6 C)  TempSrc:    Oral  SpO2: 92% 94% 95% (!) 89%  Weight:      Height:        Intake/Output Summary (Last 24 hours) at 10/25/16 0918 Last data filed at 10/25/16 0700  Gross per 24 hour  Intake             1560 ml  Output             1700 ml  Net             -140 ml   Filed Weights   10/23/16 0500 10/24/16 0459 10/25/16 0500  Weight: 216 lb 4.3 oz (98.1 kg) 217 lb 6 oz (98.6 kg) 216 lb 4.3 oz (98.1 kg)    Telemetry    Afib rates 100-120s - Personally Reviewed  ECG      Physical Exam   GEN: No acute distress.   Neck: No JVD Cardiac: irreg, no murmurs, rubs, or gallops.  Respiratory: Clear to auscultation bilaterally. GI: Soft, nontender, non-distended  MS: trace bilateral edema; No deformity. Neuro:  Nonfocal  Psych: Normal affect   Labs    Chemistry Recent Labs Lab 10/21/16 0909 10/22/16 1100 10/24/16 0423 10/25/16 0452  NA  --  136 135 134*  K  --  3.6 3.8 3.7  CL  --  99* 97* 95*  CO2  --  28 31 31   GLUCOSE  --  149* 131* 135*  BUN  --  12 13 20   CREATININE  --  1.15 1.17 1.28*    CALCIUM  --  9.2 8.7* 8.6*  PROT 6.0* 6.6  --   --   ALBUMIN 3.5* 3.7  --   --   AST 52* 44*  --   --   ALT 158* 135*  --   --   ALKPHOS 289* 243*  --   --   BILITOT 2.5* 2.3*  --   --   GFRNONAA  --  58* 57* 51*  GFRAA  --  >60 >60 59*  ANIONGAP  --  9 7 8      Hematology Recent Labs Lab 10/23/16 0434 10/24/16 0423 10/25/16 0452  WBC 10.2 7.1 7.0  RBC 4.24 4.24 4.12*  HGB 12.9* 12.8* 12.5*  HCT 39.4 38.7* 37.6*  MCV 92.9 91.3 91.3  MCH 30.4 30.2 30.3  MCHC 32.7 33.1 33.2  RDW 13.7 13.4 13.5  PLT 258 278 295  Cardiac Enzymes Recent Labs Lab 10/22/16 1100  TROPONINI <0.03   No results for input(s): TROPIPOC in the last 168 hours.   BNP Recent Labs Lab 10/22/16 1100  BNP 406.0*     DDimer No results for input(s): DDIMER in the last 168 hours.   Radiology    No results found.  Cardiac Studies    Patient Profile     83 yo male history of CAD with PCI of large 1st diagonal on 08/25/2007 with balloon angioplasty (no stent placed). Had 30-40% mid RCA disease and 20-30% mid LAD disease. LCx with only luminal irregularities, performed by Dr. Chad Cordial in [place], [place]. Also history of chronic diastolic HF,permanent afib. He has recently been off coumadin for ERCP and sphincertomy for recent admission with symptomatic gallstones. Presents with generalized fatigue. In Er found to be in afib with RVR, cardiology has been consulted. He reports medication compliance. Started on IV dilt, continued on his Toprol XL 50mg   Assessment & Plan    1. Afib with RVR - Toprol XL increased to 100 mg daily. Dilt increased to  90mg  po q8hrs, off dilt gtt. - rates remain elevated. He received additionl 5mg  of IV lopressor x 2.  - conitnue coumadin, stop heparin as he is therapeutic with his INR - soft bp's at times but overall tolerating med changes. Will give Toprol XL 50mg  extra dose this AM, follow up rates in afternoon. May consider digoxin load depending on bp's.     2. Acute on chronic diastolic HF - evidence of volume overloaded,given IV lasix 40mg  x 1 yesterday. Uptrend in Cr and BUN, no further diuresis at this time.   Merrily Pew, MD  10/25/2016, 9:18 AM

## 2016-10-25 NOTE — Progress Notes (Signed)
Heart rates trending down, currently 80s-low 100s. Continue current regimen, bp's look to be tolerating. Goal would be rates <110 overall. Woulid likely consodliate dilt to long acting tomorrow. Room to further titrate both dilt and Toprol if needed and if bp would tolerate. If soft bp's can start digoxin, IV load 0.25mg  IV every 6 hours x 4 doses then 0.125mg  daily. He is anxious to go home, if rates reasonable (<120) tomorrow and asymptomatic would be ok to discharge with outpatient f/u.   [name] MD

## 2016-10-25 NOTE — Care Management Important Message (Signed)
Important Message  Patient Details  Name: Brett [name] MRN: [id number] Date of Birth: 11-Jul-1932   Medicare Important Message Given:  Yes    Sherald Barge, RN 10/25/2016, 1:32 PM

## 2016-10-25 NOTE — Progress Notes (Addendum)
[place] for COUMADIN Indication: atrial fibrillation  No Known Allergies  Patient Measurements: Height: 6' (182.9 cm) Weight: 216 lb 4.3 oz (98.1 kg) IBW/kg (Calculated) : 77.6 Heparin Dosing Weight: 96.6 kg  Vital Signs: Temp: 97.9 F (36.6 C) (07/13 0800) Temp Source: Oral (07/13 0800) BP: 107/76 (07/13 0800) Pulse Rate: 117 (07/13 0800)  Labs:  Recent Labs  10/22/16 1100  10/23/16 0434 10/23/16 1612 10/24/16 0423 10/25/16 0452  HGB 13.7  --  12.9*  --  12.8* 12.5*  HCT 41.3  --  39.4  --  38.7* 37.6*  PLT 275  --  258  --  278 295  LABPROT  --   < > 17.0*  --  20.7* 24.8*  INR  --   < > 1.37  --  1.76 2.20  HEPARINUNFRC  --   < > <0.10* 0.44 0.48 0.52  CREATININE 1.15  --   --   --  1.17 1.28*  TROPONINI <0.03  --   --   --   --   --   < > = values in this interval not displayed.  Estimated Creatinine Clearance: 54.9 mL/min (A) (by C-G formula based on SCr of 1.28 mg/dL (H)).  Medical History: Past Medical History:  Diagnosis Date  . Atrial fibrillation ([place])    On coumadin  . Chronic kidney disease (CKD), stage III (moderate)   . Dysrhythmia    AFib  . Essential hypertension, benign   . History of cardiac catheterization    Details not certain - reportedly no interventions  . Hypothyroidism   . Macular degeneration   . Pulmonary hypertension ([place]) 12/03/2013   PA pressure 56. Ejection fraction 55-60%  . Type 2 diabetes mellitus (HCC)    Medications:  Prescriptions Prior to Admission  Medication Sig Dispense Refill Last Dose  . aspirin EC 81 MG tablet Take 1 tablet (81 mg total) by mouth daily.   10/22/2016 at Unknown time  . diltiazem (CARTIA XT) 120 MG 24 hr capsule Take 1 capsule (120 mg total) by mouth daily. 90 capsule 3 10/21/2016 at Unknown time  . diphenhydrAMINE (BENADRYL) 50 MG tablet Take 50 mg by mouth at bedtime as needed for sleep.   10/21/2016 at Unknown time  . docusate sodium (COLACE) 100 MG capsule  Take 100 mg by mouth 2 (two) times daily.   Past Week at Unknown time  . doxazosin (CARDURA) 2 MG tablet Take 1 tablet (2 mg total) by mouth at bedtime. 90 tablet 3 10/21/2016 at Unknown time  . levothyroxine (SYNTHROID, LEVOTHROID) 50 MCG tablet Take 50 mcg by mouth daily.   10/22/2016 at Unknown time  . metFORMIN (GLUCOPHAGE) 500 MG tablet Take 500 mg by mouth 2 (two) times daily with a meal.    10/22/2016 at Unknown time  . metoprolol succinate (TOPROL-XL) 50 MG 24 hr tablet Take 1 tablet (50 mg total) by mouth daily. Take with or immediately following a meal. 90 tablet 3 10/22/2016 at 0600  . warfarin (COUMADIN) 3 MG tablet Take 1 tablet (3 mg total) by mouth daily at 6 PM. 30 tablet 0 10/21/2016 at 2300   Assessment: 83 yo man on coumadin for afib.  He was admitted 10/14/16 with elevated INR and required Vitamin K 5mg  IV on 7/4.  He had ERCP on 7/5 and coumadin was restarted 7/8.  INR now back at therapeutic level. Heparin has been d/c'd.  No bleeding noted.  Goal of Therapy:  INR  2-3 Monitor platelets by anticoagulation protocol: Yes   Plan:  Coumadin 3mg  today x 1 Daily PT/INR. Monitor for bleeding complications  Hart Robinsons A 10/25/2016,9:24 AM

## 2016-10-26 LAB — GLUCOSE, CAPILLARY
GLUCOSE-CAPILLARY: 123 mg/dL — AB (ref 65–99)
GLUCOSE-CAPILLARY: 128 mg/dL — AB (ref 65–99)
Glucose-Capillary: 131 mg/dL — ABNORMAL HIGH (ref 65–99)
Glucose-Capillary: 105 mg/dL — ABNORMAL HIGH (ref 65–99)

## 2016-10-26 LAB — BASIC METABOLIC PANEL
ANION GAP: 8 (ref 5–15)
BUN: 22 mg/dL — ABNORMAL HIGH (ref 6–20)
CALCIUM: 8.7 mg/dL — AB (ref 8.9–10.3)
CO2: 30 mmol/L (ref 22–32)
CREATININE: 1.17 mg/dL (ref 0.61–1.24)
Chloride: 97 mmol/L — ABNORMAL LOW (ref 101–111)
GFR, EST NON AFRICAN AMERICAN: 57 mL/min — AB (ref >60)
Glucose, Bld: 126 mg/dL — ABNORMAL HIGH (ref 65–99)
Potassium: 3.8 mmol/L (ref 3.5–5.1)
SODIUM: 135 mmol/L (ref 135–145)

## 2016-10-26 LAB — PROTIME-INR
INR: 2.06
PROTHROMBIN TIME: 23.6 s — AB (ref 11.4–15.2)

## 2016-10-26 MED ORDER — DILTIAZEM HCL 60 MG PO TABS
90.0000 mg | ORAL_TABLET | Freq: Four times a day (QID) | ORAL | Status: DC
  Administered 2016-10-26 – 2016-10-27 (×4): 90 mg via ORAL
  Filled 2016-10-26 (×4): qty 1

## 2016-10-26 MED ORDER — WARFARIN SODIUM 1 MG PO TABS
3.0000 mg | ORAL_TABLET | Freq: Once | ORAL | Status: AC
  Administered 2016-10-26: 3 mg via ORAL
  Filled 2016-10-26: qty 1

## 2016-10-26 MED ORDER — FUROSEMIDE 40 MG PO TABS
40.0000 mg | ORAL_TABLET | Freq: Every day | ORAL | Status: DC
  Administered 2016-10-26 – 2016-10-31 (×6): 40 mg via ORAL
  Filled 2016-10-26 (×6): qty 1

## 2016-10-26 MED ORDER — METOPROLOL SUCCINATE ER 50 MG PO TB24
100.0000 mg | ORAL_TABLET | Freq: Two times a day (BID) | ORAL | Status: DC
  Administered 2016-10-26 – 2016-10-27 (×2): 100 mg via ORAL
  Filled 2016-10-26 (×2): qty 2

## 2016-10-26 NOTE — Progress Notes (Signed)
PROGRESS NOTE    Brett [name]  VEL:[id number] DOB: 06-06-1932 DOA: 10/22/2016 PCP: Patient, No Pcp Per    Brief Narrative:  83 year old male who was recently discharged from hospital after being treated for choledocholithiasis status post biliary sphincterotomy and stone extraction. During that hospitalization, his blood pressure was noted to be running low and his rate control medications were held. He was readmitted to the hospital with atrial fibrillation. Currently, rate control medications are being adjusted. He is anticoagulated with coumadin. Cardiology following   Assessment & Plan:   Active Problems:   Type 2 diabetes mellitus with diabetic nephropathy (HCC)   Hypertension   Atrial fibrillation with RVR (HCC)   Chronic kidney disease (CKD), stage III (moderate)   Hypothyroidism   HLD (hyperlipidemia)   Acute on chronic diastolic CHF (congestive heart failure) ([place])   1. Chronic atrial fibrillation with rapid ventricular response. Possibly precipitated by discontinuation of rate control medications during last hospitalization due to hypotension. Currently on oral cardizem and toprol.  Heart rate remains uncontrolled today, so toprol and oral cardizem are being adjusted. Cardiology following. May need to consider digoxin if heart rate does not improve and blood pressure is low. Currently, blood pressure will allow further adjustment of cardizem. Will increase cardizem to 90mg  q6h. Continue toprol at 150mg  daily. INR is therapeutic on coumadin.  2. Diabetes. Hold metformin. Continue on sliding scale insulin. Blood sugars have been stable 3. Hypertension. Blood pressures currently stable. Continue current treatments. 4. Chronic kidney disease stage III. Creatinine stable. Continue to monitor. 5. Acute on Chronic diastolic congestive heart failure. Lasix was on hold yesterday due to increase in creatinine. Renal function is better today. Will restart on oral  lasix. 6. Hypothyroidism. Continue Synthroid   DVT prophylaxis: coumadin Code Status: full code Family Communication: no family present Disposition Plan: discharge home once improved   Consultants:   cardiology  Procedures:     Antimicrobials:       Subjective: No chest pain. No shortness of breath  Objective: Vitals:   10/26/16 0500 10/26/16 0600 10/26/16 0700 10/26/16 0800  BP: 120/86 (!) 120/94 117/84 132/90  Pulse: (!) 117 (!) 121 (!) 122 (!) 127  Resp: 17 17 (!) 21 20  Temp:    98.5 F (36.9 C)  TempSrc:    Oral  SpO2: 95% 96% 97% 93%  Weight:      Height:        Intake/Output Summary (Last 24 hours) at 10/26/16 0953 Last data filed at 10/26/16 0400  Gross per 24 hour  Intake              245 ml  Output              500 ml  Net             -255 ml   Filed Weights   10/23/16 0500 10/24/16 0459 10/25/16 0500  Weight: 98.1 kg (216 lb 4.3 oz) 98.6 kg (217 lb 6 oz) 98.1 kg (216 lb 4.3 oz)    Examination:  General exam: Alert, awake, oriented x 3 Respiratory system: Clear to auscultation. Respiratory effort normal. Cardiovascular system:S1, S2 irregular. No murmurs, rubs, gallops. Gastrointestinal system: Abdomen is nondistended, soft and nontender. No organomegaly or masses felt. Normal bowel sounds heard. Central nervous system: Alert and oriented. No focal neurological deficits. Extremities: 1+ edema bilaterally Skin: No rashes, lesions or ulcers Psychiatry: Judgement and insight appear normal. Mood & affect appropriate.  Data Reviewed: I have personally reviewed following labs and imaging studies  CBC:  Recent Labs Lab 10/22/16 1100 10/23/16 0434 10/24/16 0423 10/25/16 0452  WBC 8.3 10.2 7.1 7.0  NEUTROABS 6.1  --   --   --   HGB 13.7 12.9* 12.8* 12.5*  HCT 41.3 39.4 38.7* 37.6*  MCV 91.4 92.9 91.3 91.3  PLT 275 258 278 325   Basic Metabolic Panel:  Recent Labs Lab 10/22/16 1100 10/24/16 0423 10/25/16 0452  10/26/16 0433  NA 136 135 134* 135  K 3.6 3.8 3.7 3.8  CL 99* 97* 95* 97*  CO2 28 31 31 30   GLUCOSE 149* 131* 135* 126*  BUN 12 13 20  22*  CREATININE 1.15 1.17 1.28* 1.17  CALCIUM 9.2 8.7* 8.6* 8.7*   GFR: Estimated Creatinine Clearance: 60.1 mL/min (by C-G formula based on SCr of 1.17 mg/dL). Liver Function Tests:  Recent Labs Lab 10/21/16 0909 10/22/16 1100  AST 52* 44*  ALT 158* 135*  ALKPHOS 289* 243*  BILITOT 2.5* 2.3*  PROT 6.0* 6.6  ALBUMIN 3.5* 3.7   No results for input(s): LIPASE, AMYLASE in the last 168 hours. No results for input(s): AMMONIA in the last 168 hours. Coagulation Profile:  Recent Labs Lab 10/22/16 1202 10/23/16 0434 10/24/16 0423 10/25/16 0452 10/26/16 0433  INR 1.26 1.37 1.76 2.20 2.06   Cardiac Enzymes:  Recent Labs Lab 10/22/16 1100  TROPONINI <0.03   BNP (last 3 results) No results for input(s): PROBNP in the last 8760 hours. HbA1C: No results for input(s): HGBA1C in the last 72 hours. CBG:  Recent Labs Lab 10/25/16 0743 10/25/16 1107 10/25/16 1545 10/25/16 2251 10/26/16 0740  GLUCAP 129* 178* 106* 114* 131*   Lipid Profile: No results for input(s): CHOL, HDL, LDLCALC, TRIG, CHOLHDL, LDLDIRECT in the last 72 hours. Thyroid Function Tests: No results for input(s): TSH, T4TOTAL, FREET4, T3FREE, THYROIDAB in the last 72 hours. Anemia Panel: No results for input(s): VITAMINB12, FOLATE, FERRITIN, TIBC, IRON, RETICCTPCT in the last 72 hours. Sepsis Labs: No results for input(s): PROCALCITON, LATICACIDVEN in the last 168 hours.  Recent Results (from the past 240 hour(s))  MRSA PCR Screening     Status: None   Collection Time: 10/22/16  4:15 PM  Result Value Ref Range Status   MRSA by PCR NEGATIVE NEGATIVE Final    Comment:        The GeneXpert MRSA Assay (FDA approved for NASAL specimens only), is one component of a comprehensive MRSA colonization surveillance program. It is not intended to diagnose MRSA infection  nor to guide or monitor treatment for MRSA infections.          Radiology Studies: No results found.      Scheduled Meds: . aspirin EC  81 mg Oral Daily  . diltiazem  90 mg Oral Q6H  . docusate sodium  100 mg Oral BID  . insulin aspart  0-15 Units Subcutaneous TID WC  . insulin aspart  0-5 Units Subcutaneous QHS  . levothyroxine  50 mcg Oral QAC breakfast  . metoprolol succinate  150 mg Oral Daily  . Warfarin - Pharmacist Dosing Inpatient   Does not apply q1800   Continuous Infusions: . diltiazem (CARDIZEM) infusion Stopped (10/24/16 1300)     LOS: 4 days    Time spent: 6mins    MEMON,JEHANZEB, MD [facility] Pager [phone]  If 7PM-7AM, please contact night-coverage [email or web address] Password [facility] 10/26/2016, 9:53 AM

## 2016-10-26 NOTE — Progress Notes (Signed)
[place] for COUMADIN Indication: atrial fibrillation  No Known Allergies  Patient Measurements: Height: 6' (182.9 cm) Weight: 216 lb 4.3 oz (98.1 kg) IBW/kg (Calculated) : 77.6 Heparin Dosing Weight: 96.6 kg  Vital Signs: Temp: 98.5 F (36.9 C) (07/14 0800) Temp Source: Oral (07/14 0800) BP: 132/90 (07/14 0800) Pulse Rate: 127 (07/14 0800)  Labs:  Recent Labs  10/23/16 1612 10/24/16 0423 10/25/16 0452 10/26/16 0433  HGB  --  12.8* 12.5*  --   HCT  --  38.7* 37.6*  --   PLT  --  278 295  --   LABPROT  --  20.7* 24.8* 23.6*  INR  --  1.76 2.20 2.06  HEPARINUNFRC 0.44 0.48 0.52  --   CREATININE  --  1.17 1.28* 1.17    Estimated Creatinine Clearance: 60.1 mL/min (by C-G formula based on SCr of 1.17 mg/dL).  Medical History: Past Medical History:  Diagnosis Date  . Atrial fibrillation ([place])    On coumadin  . Chronic kidney disease (CKD), stage III (moderate)   . Dysrhythmia    AFib  . Essential hypertension, benign   . History of cardiac catheterization    Details not certain - reportedly no interventions  . Hypothyroidism   . Macular degeneration   . Pulmonary hypertension ([place]) 12/03/2013   PA pressure 56. Ejection fraction 55-60%  . Type 2 diabetes mellitus (HCC)    Medications:  Prescriptions Prior to Admission  Medication Sig Dispense Refill Last Dose  . aspirin EC 81 MG tablet Take 1 tablet (81 mg total) by mouth daily.   10/22/2016 at Unknown time  . diltiazem (CARTIA XT) 120 MG 24 hr capsule Take 1 capsule (120 mg total) by mouth daily. 90 capsule 3 10/21/2016 at Unknown time  . diphenhydrAMINE (BENADRYL) 50 MG tablet Take 50 mg by mouth at bedtime as needed for sleep.   10/21/2016 at Unknown time  . docusate sodium (COLACE) 100 MG capsule Take 100 mg by mouth 2 (two) times daily.   Past Week at Unknown time  . doxazosin (CARDURA) 2 MG tablet Take 1 tablet (2 mg total) by mouth at bedtime. 90 tablet 3 10/21/2016 at  Unknown time  . levothyroxine (SYNTHROID, LEVOTHROID) 50 MCG tablet Take 50 mcg by mouth daily.   10/22/2016 at Unknown time  . metFORMIN (GLUCOPHAGE) 500 MG tablet Take 500 mg by mouth 2 (two) times daily with a meal.    10/22/2016 at Unknown time  . metoprolol succinate (TOPROL-XL) 50 MG 24 hr tablet Take 1 tablet (50 mg total) by mouth daily. Take with or immediately following a meal. 90 tablet 3 10/22/2016 at 0600  . warfarin (COUMADIN) 3 MG tablet Take 1 tablet (3 mg total) by mouth daily at 6 PM. 30 tablet 0 10/21/2016 at 2300   Assessment: 83 yo man on coumadin for afib.  He was admitted 10/14/16 with elevated INR and required Vitamin K 5mg  IV on 7/4.  He had ERCP on 7/5 and coumadin was restarted 7/8.  INR now back at therapeutic level. Heparin has been d/c'd.  No bleeding noted.  Goal of Therapy:  INR 2-3 Monitor platelets by anticoagulation protocol: Yes   Plan:  Coumadin 3mg  today x 1 Daily PT/INR. Monitor for bleeding complications  Hart Robinsons A 10/26/2016,10:32 AM

## 2016-10-27 LAB — PROTIME-INR
INR: 2.02
Prothrombin Time: 23.2 seconds — ABNORMAL HIGH (ref 11.4–15.2)

## 2016-10-27 LAB — BASIC METABOLIC PANEL
ANION GAP: 8 (ref 5–15)
BUN: 23 mg/dL — ABNORMAL HIGH (ref 6–20)
CO2: 29 mmol/L (ref 22–32)
Calcium: 8.8 mg/dL — ABNORMAL LOW (ref 8.9–10.3)
Chloride: 101 mmol/L (ref 101–111)
Creatinine, Ser: 1.15 mg/dL (ref 0.61–1.24)
GFR calc Af Amer: >60 mL/min (ref >60)
GFR, EST NON AFRICAN AMERICAN: 58 mL/min — AB (ref >60)
GLUCOSE: 136 mg/dL — AB (ref 65–99)
POTASSIUM: 3.8 mmol/L (ref 3.5–5.1)
Sodium: 138 mmol/L (ref 135–145)

## 2016-10-27 LAB — GLUCOSE, CAPILLARY
GLUCOSE-CAPILLARY: 206 mg/dL — AB (ref 65–99)
GLUCOSE-CAPILLARY: 97 mg/dL (ref 65–99)
GLUCOSE-CAPILLARY: 149 mg/dL — AB (ref 65–99)
Glucose-Capillary: 130 mg/dL — ABNORMAL HIGH (ref 65–99)

## 2016-10-27 MED ORDER — DILTIAZEM HCL ER COATED BEADS 180 MG PO CP24
360.0000 mg | ORAL_CAPSULE | Freq: Every day | ORAL | Status: DC
  Administered 2016-10-27 – 2016-10-31 (×5): 360 mg via ORAL
  Filled 2016-10-27 (×5): qty 2

## 2016-10-27 MED ORDER — WARFARIN SODIUM 2 MG PO TABS
3.0000 mg | ORAL_TABLET | Freq: Once | ORAL | Status: AC
  Administered 2016-10-27: 3 mg via ORAL
  Filled 2016-10-27: qty 1

## 2016-10-27 NOTE — Progress Notes (Signed)
Dr. Roderic Palau notified of 6 second asystole.  VS stable, patient alert and has no complaints.

## 2016-10-27 NOTE — Progress Notes (Signed)
Dr. Roderic Palau notified of pauses greater than 3 seconds in cardiac rhythm

## 2016-10-27 NOTE — Progress Notes (Signed)
[place] for COUMADIN Indication: atrial fibrillation  No Known Allergies  Patient Measurements: Height: 6' (182.9 cm) Weight: 212 lb 15.4 oz (96.6 kg) IBW/kg (Calculated) : 77.6 Heparin Dosing Weight: 96.6 kg  Vital Signs: Temp: 97.7 F (36.5 C) (07/15 0800) Temp Source: Oral (07/15 0800) BP: 125/97 (07/15 0500)  Labs:  Recent Labs  10/25/16 0452 10/26/16 0433 10/27/16 0424  HGB 12.5*  --   --   HCT 37.6*  --   --   PLT 295  --   --   LABPROT 24.8* 23.6* 23.2*  INR 2.20 2.06 2.02  HEPARINUNFRC 0.52  --   --   CREATININE 1.28* 1.17 1.15    Estimated Creatinine Clearance: 60.7 mL/min (by C-G formula based on SCr of 1.15 mg/dL).  Medical History: Past Medical History:  Diagnosis Date  . Atrial fibrillation ([place])    On coumadin  . Chronic kidney disease (CKD), stage III (moderate)   . Dysrhythmia    AFib  . Essential hypertension, benign   . History of cardiac catheterization    Details not certain - reportedly no interventions  . Hypothyroidism   . Macular degeneration   . Pulmonary hypertension ([place]) 12/03/2013   PA pressure 56. Ejection fraction 55-60%  . Type 2 diabetes mellitus (HCC)    Medications:  Prescriptions Prior to Admission  Medication Sig Dispense Refill Last Dose  . aspirin EC 81 MG tablet Take 1 tablet (81 mg total) by mouth daily.   10/22/2016 at Unknown time  . diltiazem (CARTIA XT) 120 MG 24 hr capsule Take 1 capsule (120 mg total) by mouth daily. 90 capsule 3 10/21/2016 at Unknown time  . diphenhydrAMINE (BENADRYL) 50 MG tablet Take 50 mg by mouth at bedtime as needed for sleep.   10/21/2016 at Unknown time  . docusate sodium (COLACE) 100 MG capsule Take 100 mg by mouth 2 (two) times daily.   Past Week at Unknown time  . doxazosin (CARDURA) 2 MG tablet Take 1 tablet (2 mg total) by mouth at bedtime. 90 tablet 3 10/21/2016 at Unknown time  . levothyroxine (SYNTHROID, LEVOTHROID) 50 MCG tablet Take 50 mcg by  mouth daily.   10/22/2016 at Unknown time  . metFORMIN (GLUCOPHAGE) 500 MG tablet Take 500 mg by mouth 2 (two) times daily with a meal.    10/22/2016 at Unknown time  . metoprolol succinate (TOPROL-XL) 50 MG 24 hr tablet Take 1 tablet (50 mg total) by mouth daily. Take with or immediately following a meal. 90 tablet 3 10/22/2016 at 0600  . warfarin (COUMADIN) 3 MG tablet Take 1 tablet (3 mg total) by mouth daily at 6 PM. 30 tablet 0 10/21/2016 at 2300   Assessment: 83 yo man on coumadin for afib.  He was admitted 10/14/16 with elevated INR and required Vitamin K 5mg  IV on 7/4.  He had ERCP on 7/5 and coumadin was restarted 7/8.  INR now back at therapeutic level. Heparin has been d/c'd.  No bleeding noted.  Goal of Therapy:  INR 2-3 Monitor platelets by anticoagulation protocol: Yes   Plan:  Coumadin 3mg  today x 1 Daily PT/INR. Monitor for bleeding complications  Hart Robinsons A 10/27/2016,8:38 AM

## 2016-10-27 NOTE — Progress Notes (Addendum)
PROGRESS NOTE    KONRAD HOAK  CWC:[id number] DOB: [birth year] DOA: 10/22/2016 PCP: Patient, No Pcp Per    Brief Narrative:  83 year old male who was recently discharged from hospital after being treated for choledocholithiasis status post biliary sphincterotomy and stone extraction. During that hospitalization, his blood pressure was noted to be running low and his rate control medications were held. He was readmitted to the hospital with atrial fibrillation. Currently, rate control medications are being adjusted. He is anticoagulated with coumadin. Cardiology following   Assessment & Plan:   Active Problems:   Type 2 diabetes mellitus with diabetic nephropathy (HCC)   Hypertension   Atrial fibrillation with RVR (HCC)   Chronic kidney disease (CKD), stage III (moderate)   Hypothyroidism   HLD (hyperlipidemia)   Acute on chronic diastolic CHF (congestive heart failure) ([place])   1. Chronic atrial fibrillation with rapid ventricular response. Possibly precipitated by discontinuation of rate control medications during last hospitalization due to hypotension. Currently on oral cardizem and toprol.  Heart rate has been in low 100s. Continue toprol at 100mg  bid. Will change cardizem to long acting at 360mg  daily. Cardiology following. May need to consider digoxin if heart rate does not improve and blood pressure is low. INR is therapeutic on coumadin. Will monitor another 24 hours. 2. Diabetes. Hold metformin. Continue on sliding scale insulin. Blood sugars have been stable 3. Hypertension. Blood pressures currently stable. Continue current treatments. 4. Chronic kidney disease stage III. Creatinine stable. Continue to monitor. 5. Acute on Chronic diastolic congestive heart failure. Volume status improving. Continue on oral lasix. 6. Hypothyroidism. Continue Synthroid   DVT prophylaxis: coumadin Code Status: full code Family Communication: no family present Disposition Plan: discharge  home once improved   Consultants:   cardiology  Procedures:     Antimicrobials:       Subjective: No palpitations. No chest pain. Feeling better, heart rate has mostly been in low 100s.  Objective: Vitals:   10/27/16 0300 10/27/16 0400 10/27/16 0500 10/27/16 0800  BP: 114/60 (!) 117/93 (!) 125/97   Pulse:      Resp: 17 18 19    Temp:  98.1 F (36.7 C)  97.7 F (36.5 C)  TempSrc:  Oral  Oral  SpO2:      Weight:   96.6 kg (212 lb 15.4 oz)   Height:        Intake/Output Summary (Last 24 hours) at 10/27/16 0943 Last data filed at 10/27/16 0500  Gross per 24 hour  Intake              240 ml  Output              375 ml  Net             -135 ml   Filed Weights   10/24/16 0459 10/25/16 0500 10/27/16 0500  Weight: 98.6 kg (217 lb 6 oz) 98.1 kg (216 lb 4.3 oz) 96.6 kg (212 lb 15.4 oz)    Examination:  General exam: Alert, awake, oriented x 3 Respiratory system: Clear to auscultation. Respiratory effort normal. Cardiovascular system:S1 S2 irregular. No murmurs, rubs, gallops. Gastrointestinal system: Abdomen is nondistended, soft and nontender. No organomegaly or masses felt. Normal bowel sounds heard. Central nervous system: Alert and oriented. No focal neurological deficits. Extremities: 1+ pedal edema b/l Skin: No rashes, lesions or ulcers Psychiatry: Judgement and insight appear normal. Mood & affect appropriate.  .        Data Reviewed: I have personally  reviewed following labs and imaging studies  CBC:  Recent Labs Lab 10/22/16 1100 10/23/16 0434 10/24/16 0423 10/25/16 0452  WBC 8.3 10.2 7.1 7.0  NEUTROABS 6.1  --   --   --   HGB 13.7 12.9* 12.8* 12.5*  HCT 41.3 39.4 38.7* 37.6*  MCV 91.4 92.9 91.3 91.3  PLT 275 258 278 751   Basic Metabolic Panel:  Recent Labs Lab 10/22/16 1100 10/24/16 0423 10/25/16 0452 10/26/16 0433 10/27/16 0424  NA 136 135 134* 135 138  K 3.6 3.8 3.7 3.8 3.8  CL 99* 97* 95* 97* 101  CO2 28 31 31 30 29     GLUCOSE 149* 131* 135* 126* 136*  BUN 12 13 20  22* 23*  CREATININE 1.15 1.17 1.28* 1.17 1.15  CALCIUM 9.2 8.7* 8.6* 8.7* 8.8*   GFR: Estimated Creatinine Clearance: 60.7 mL/min (by C-G formula based on SCr of 1.15 mg/dL). Liver Function Tests:  Recent Labs Lab 10/21/16 0909 10/22/16 1100  AST 52* 44*  ALT 158* 135*  ALKPHOS 289* 243*  BILITOT 2.5* 2.3*  PROT 6.0* 6.6  ALBUMIN 3.5* 3.7   No results for input(s): LIPASE, AMYLASE in the last 168 hours. No results for input(s): AMMONIA in the last 168 hours. Coagulation Profile:  Recent Labs Lab 10/23/16 0434 10/24/16 0423 10/25/16 0452 10/26/16 0433 10/27/16 0424  INR 1.37 1.76 2.20 2.06 2.02   Cardiac Enzymes:  Recent Labs Lab 10/22/16 1100  TROPONINI <0.03   BNP (last 3 results) No results for input(s): PROBNP in the last 8760 hours. HbA1C: No results for input(s): HGBA1C in the last 72 hours. CBG:  Recent Labs Lab 10/26/16 0740 10/26/16 1133 10/26/16 1629 10/26/16 2141 10/27/16 0750  GLUCAP 131* 123* 128* 105* 130*   Lipid Profile: No results for input(s): CHOL, HDL, LDLCALC, TRIG, CHOLHDL, LDLDIRECT in the last 72 hours. Thyroid Function Tests: No results for input(s): TSH, T4TOTAL, FREET4, T3FREE, THYROIDAB in the last 72 hours. Anemia Panel: No results for input(s): VITAMINB12, FOLATE, FERRITIN, TIBC, IRON, RETICCTPCT in the last 72 hours. Sepsis Labs: No results for input(s): PROCALCITON, LATICACIDVEN in the last 168 hours.  Recent Results (from the past 240 hour(s))  MRSA PCR Screening     Status: None   Collection Time: 10/22/16  4:15 PM  Result Value Ref Range Status   MRSA by PCR NEGATIVE NEGATIVE Final    Comment:        The GeneXpert MRSA Assay (FDA approved for NASAL specimens only), is one component of a comprehensive MRSA colonization surveillance program. It is not intended to diagnose MRSA infection nor to guide or monitor treatment for MRSA infections.           Radiology Studies: No results found.      Scheduled Meds: . aspirin EC  81 mg Oral Daily  . diltiazem  360 mg Oral Daily  . docusate sodium  100 mg Oral BID  . furosemide  40 mg Oral Daily  . insulin aspart  0-15 Units Subcutaneous TID WC  . insulin aspart  0-5 Units Subcutaneous QHS  . levothyroxine  50 mcg Oral QAC breakfast  . metoprolol succinate  100 mg Oral BID  . warfarin  3 mg Oral Once  . Warfarin - Pharmacist Dosing Inpatient   Does not apply q1800   Continuous Infusions: . diltiazem (CARDIZEM) infusion Stopped (10/24/16 1300)     LOS: 5 days    Time spent: 46mins    Keigan Girten, MD [facility] Pager [phone]  If 7PM-7AM, please contact night-coverage [email or web address] Password [email or web address] 10/27/2016, 9:43 AM   Addendum 14:30:  Came to evaluate patient. Was informed by staff that he had a 6 second pause on telemetry. Heart rate currently in 80s and blood pressure in low 100s. Patient is asymptomatic, no chest pain, shortness of breath. Pause may be related to recent increase in rate control medications. Will hold further toprol for today. He has already received cardizem CD 360mg  today. Will hold off on any further AVN blocking agents until he can be reassessed tomorrow. Advised staff to keep pacer pads near patient if needed. Likely need to let current medications to wear off. Will continue to monitor patient closely.  Micheal Sheen

## 2016-10-28 LAB — CBC
HCT: 35.2 % — ABNORMAL LOW (ref 39.0–52.0)
HEMOGLOBIN: 11.6 g/dL — AB (ref 13.0–17.0)
MCH: 30.3 pg (ref 26.0–34.0)
MCHC: 33 g/dL (ref 30.0–36.0)
MCV: 91.9 fL (ref 78.0–100.0)
Platelets: 298 10*3/uL (ref 150–400)
RBC: 3.83 MIL/uL — AB (ref 4.22–5.81)
RDW: 13.7 % (ref 11.5–15.5)
WBC: 9.6 10*3/uL (ref 4.0–10.5)

## 2016-10-28 LAB — GLUCOSE, CAPILLARY
GLUCOSE-CAPILLARY: 150 mg/dL — AB (ref 65–99)
GLUCOSE-CAPILLARY: 184 mg/dL — AB (ref 65–99)
GLUCOSE-CAPILLARY: 108 mg/dL — AB (ref 65–99)
GLUCOSE-CAPILLARY: 138 mg/dL — AB (ref 65–99)

## 2016-10-28 LAB — PROTIME-INR
INR: 2.39
PROTHROMBIN TIME: 26.5 s — AB (ref 11.4–15.2)

## 2016-10-28 MED ORDER — SENNOSIDES-DOCUSATE SODIUM 8.6-50 MG PO TABS
2.0000 | ORAL_TABLET | Freq: Every day | ORAL | Status: DC | PRN
  Administered 2016-10-28 – 2016-10-29 (×2): 2 via ORAL
  Filled 2016-10-28 (×2): qty 2

## 2016-10-28 MED ORDER — METOPROLOL TARTRATE 25 MG PO TABS
25.0000 mg | ORAL_TABLET | Freq: Two times a day (BID) | ORAL | Status: DC
  Administered 2016-10-28 – 2016-10-29 (×3): 25 mg via ORAL
  Filled 2016-10-28 (×3): qty 1

## 2016-10-28 MED ORDER — WARFARIN SODIUM 2 MG PO TABS
3.0000 mg | ORAL_TABLET | Freq: Once | ORAL | Status: AC
  Administered 2016-10-28: 3 mg via ORAL
  Filled 2016-10-28: qty 1

## 2016-10-28 NOTE — Progress Notes (Signed)
Progress Note  Patient Name: Brett [name] Date of Encounter: 10/28/2016  Primary Cardiologist: Bronson Ing   Subjective   Frustrated. Wants to go home. No chest pain, no dyspnea.   Inpatient Medications    Scheduled Meds: . aspirin EC  81 mg Oral Daily  . diltiazem  360 mg Oral Daily  . docusate sodium  100 mg Oral BID  . furosemide  40 mg Oral Daily  . insulin aspart  0-15 Units Subcutaneous TID WC  . insulin aspart  0-5 Units Subcutaneous QHS  . levothyroxine  50 mcg Oral QAC breakfast  . warfarin  3 mg Oral Once  . Warfarin - Pharmacist Dosing Inpatient   Does not apply q1800   Continuous Infusions: . diltiazem (CARDIZEM) infusion Stopped (10/24/16 1300)   PRN Meds: metoprolol tartrate, zolpidem   Vital Signs    Vitals:   10/27/16 2000 10/28/16 0400 10/28/16 0500 10/28/16 0714  BP:      Pulse:      Resp:    (!) 23  Temp: 98.4 F (36.9 C) (!) 97.5 F (36.4 C)  (!) 97.5 F (36.4 C)  TempSrc: Oral Oral  Oral  SpO2:      Weight:   214 lb 15.2 oz (97.5 kg)   Height:        Intake/Output Summary (Last 24 hours) at 10/28/16 1106 Last data filed at 10/28/16 0500  Gross per 24 hour  Intake              240 ml  Output              650 ml  Net             -410 ml   Filed Weights   10/25/16 0500 10/27/16 0500 10/28/16 0500  Weight: 216 lb 4.3 oz (98.1 kg) 212 lb 15.4 oz (96.6 kg) 214 lb 15.2 oz (97.5 kg)    Telemetry    Atrial fib with rates up to 130 bpm, with frequent pauses > 2.0 seconds. - Personally Reviewed  Physical Exam   GEN: No acute distress.   Neck: No JVD Cardiac: IRRR, no murmurs, rubs, or gallops.  Respiratory: Crackles in the base of the left lung.  GI: Soft, nontender, non-distended  MS: No edema; No deformity. Neuro:  Nonfocal  Psych: Normal affect   Labs    Chemistry Recent Labs Lab 10/22/16 1100  10/25/16 0452 10/26/16 0433 10/27/16 0424  NA 136  < > 134* 135 138  K 3.6  < > 3.7 3.8 3.8  CL 99*  < > 95* 97* 101  CO2  28  < > 31 30 29   GLUCOSE 149*  < > 135* 126* 136*  BUN 12  < > 20 22* 23*  CREATININE 1.15  < > 1.28* 1.17 1.15  CALCIUM 9.2  < > 8.6* 8.7* 8.8*  PROT 6.6  --   --   --   --   ALBUMIN 3.7  --   --   --   --   AST 44*  --   --   --   --   ALT 135*  --   --   --   --   ALKPHOS 243*  --   --   --   --   BILITOT 2.3*  --   --   --   --   GFRNONAA 58*  < > 51* 57* 58*  GFRAA >60  < > 59* >60 >  60  ANIONGAP 9  < > 8 8 8   < > = values in this interval not displayed.   Hematology Recent Labs Lab 10/24/16 0423 10/25/16 0452 10/28/16 0448  WBC 7.1 7.0 9.6  RBC 4.24 4.12* 3.83*  HGB 12.8* 12.5* 11.6*  HCT 38.7* 37.6* 35.2*  MCV 91.3 91.3 91.9  MCH 30.2 30.3 30.3  MCHC 33.1 33.2 33.0  RDW 13.4 13.5 13.7  PLT 278 295 298    Cardiac Enzymes Recent Labs Lab 10/22/16 1100  TROPONINI <0.03   No results for input(s): TROPIPOC in the last 168 hours.   BNP Recent Labs Lab 10/22/16 1100  BNP 406.0*     DDimer No results for input(s): DDIMER in the last 168 hours.   Radiology    No results found.  Cardiac Studies   Echocardiogram 12/10/2016 Procedure narrative: Patient in rapid atrial fibrillation. - Left ventricle: Systolic function was normal. The estimated ejection fraction was in the range of 50% to 55%. Wall motion was normal; there were no regional wall motion abnormalities. - Mitral valve: Mildly calcified annulus. Mildly thickened leaflets . There was trivial regurgitation. - Left atrium: No evidence of thrombus in the atrial cavity or appendage. Normal velocities noted. - Atrial septum: No defect or patent foramen ovale was identified.   Patient Profile     83 y.o. male history of CAD with PCI of large 1st diagonal on 08/25/2007 with balloon angioplasty (no stent placed). Had 30-40% mid RCA disease and 20-30% mid LAD disease. LCx with only luminal irregularities, performed by Dr. Chad Cordial in [place], [place]. Also history of chronic diastolic  HF,permanent afib. He has recently been off coumadin for ERCP and sphincertomy for recent admission with symptomatic gallstones. Presents with generalized fatigue. In Er found to be in afib with RVR, cardiology has been consulted. He reports medication compliance.  Assessment & Plan    1. Atrial fib with tachybrady:  Has been on diltiazem po for rate control titrated up to 360 mg daily, with metoprolol up to 150 mg daily. However he began to have pauses, > 2 seconds over the weekend and has not been taken off of metoprolol. He continues to have tachycardia with pauses today, with three episodes this morning. BP is stable but soft. He is asymptomatic currently, but frustrated and wants to go home.   Reluctant to start further AV nodal blocking agents in the setting of pauses. Starting digoxin and putting back on BB may increase incidence of pauses. Will wait for BB wash out.  Consider decreasing diltiazem to 240 mg daily and starting digoxin tomorrow, using IV metoprolol prn for symptomatic rapid HR.    Would not be a candidate for DCCV as he has had long standing atrial fib. Have discussed this with Dr. Harrington Challenger who will be seeing him after lunch.  Would not start amiodarone as he is on coumadin and there may be issues with combination with INR control.   I have reviewed his home medications, prior to admission. He is on metoprolol 50 XL daily and diltiazem 120 mg XL for rate control.   TSH has been checked and WNL. Continue coumadin. Consider sleep study as he is desaturating overnight,. This may help with HR control..   2. Acute on Chronic Diastolic CHF: No evidence of decompensation despite rapid HR. Echo demonstrated normal EF.   Signed, Phill Myron. Lawrence DNP, ANP, AACC   10/28/2016, 11:06 AM    Pt see and examined  I agree with findings  as noted by Arnold Long above  Pt remains in afib with RVR  Does have some 2-2.5 sec pauses. He denies SOB   Lungs Rel clear  Cardiac Irreg irreg  No S3  Ext  without edema  I would continue rate control   Advance and follow  I do nto see any prohibitve pauses  He has been in afib for years   I do not think cardioversion would be successful  If he cannot tolerate titration would consider for PPM placment   Dorris Carnes

## 2016-10-28 NOTE — Progress Notes (Signed)
[facility] for COUMADIN Indication: atrial fibrillation  No Known Allergies  Patient Measurements: Height: 6' (182.9 cm) Weight: 214 lb 15.2 oz (97.5 kg) IBW/kg (Calculated) : 77.6 Heparin Dosing Weight: 96.6 kg  Vital Signs: Temp: 97.5 F (36.4 C) (07/16 0714) Temp Source: Oral (07/16 0714)  Labs:  Recent Labs  10/26/16 0433 10/27/16 0424 10/28/16 0448  HGB  --   --  11.6*  HCT  --   --  35.2*  PLT  --   --  298  LABPROT 23.6* 23.2* 26.5*  INR 2.06 2.02 2.39  CREATININE 1.17 1.15  --    Estimated Creatinine Clearance: 61 mL/min (by C-G formula based on SCr of 1.15 mg/dL).  Medical History: Past Medical History:  Diagnosis Date  . Atrial fibrillation ([place])    On coumadin  . Chronic kidney disease (CKD), stage III (moderate)   . Dysrhythmia    AFib  . Essential hypertension, benign   . History of cardiac catheterization    Details not certain - reportedly no interventions  . Hypothyroidism   . Macular degeneration   . Pulmonary hypertension ([place]) 12/03/2013   PA pressure 56. Ejection fraction 55-60%  . Type 2 diabetes mellitus ([facility])    Medications:  Prescriptions Prior to Admission  Medication Sig Dispense Refill Last Dose  . aspirin EC 81 MG tablet Take 1 tablet (81 mg total) by mouth daily.   10/22/2016 at Unknown time  . diltiazem (CARTIA XT) 120 MG 24 hr capsule Take 1 capsule (120 mg total) by mouth daily. 90 capsule 3 10/21/2016 at Unknown time  . diphenhydrAMINE (BENADRYL) 50 MG tablet Take 50 mg by mouth at bedtime as needed for sleep.   10/21/2016 at Unknown time  . docusate sodium (COLACE) 100 MG capsule Take 100 mg by mouth 2 (two) times daily.   Past Week at Unknown time  . doxazosin (CARDURA) 2 MG tablet Take 1 tablet (2 mg total) by mouth at bedtime. 90 tablet 3 10/21/2016 at Unknown time  . levothyroxine (SYNTHROID, LEVOTHROID) 50 MCG tablet Take 50 mcg by mouth daily.   10/22/2016 at Unknown time  . metFORMIN  (GLUCOPHAGE) 500 MG tablet Take 500 mg by mouth 2 (two) times daily with a meal.    10/22/2016 at Unknown time  . metoprolol succinate (TOPROL-XL) 50 MG 24 hr tablet Take 1 tablet (50 mg total) by mouth daily. Take with or immediately following a meal. 90 tablet 3 10/22/2016 at 0600  . warfarin (COUMADIN) 3 MG tablet Take 1 tablet (3 mg total) by mouth daily at 6 PM. 30 tablet 0 10/21/2016 at 2300   Assessment: 83 yo man on coumadin for afib.  He was admitted 10/14/16 with elevated INR and required Vitamin K 5mg  IV on 7/4.  He had ERCP on 7/5 and coumadin was restarted 7/8.  INR now back at therapeutic level. Heparin has been d/c'd.  No bleeding noted.  Goal of Therapy:  INR 2-3 Monitor platelets by anticoagulation protocol: Yes   Plan:  Coumadin 3mg  today x 1 Daily PT/INR. Monitor for bleeding complications  Hart Robinsons A 10/28/2016,9:25 AM

## 2016-10-28 NOTE — Progress Notes (Signed)
PROGRESS NOTE    Brett [name]  VHQ:[id number] DOB: [birth year] DOA: 10/22/2016 PCP: Patient, No Pcp Per    Brief Narrative:  83 year old male who was recently discharged from hospital after being treated for choledocholithiasis status post biliary sphincterotomy and stone extraction. During that hospitalization, his blood pressure was noted to be running low and his rate control medications were held. He was readmitted to the hospital with atrial fibrillation. Currently, rate control medications are being adjusted. He is anticoagulated with coumadin. Cardiology following   Assessment & Plan:   Active Problems:   Type 2 diabetes mellitus with diabetic nephropathy (HCC)   Hypertension   Atrial fibrillation with RVR (HCC)   Chronic kidney disease (CKD), stage III (moderate)   Hypothyroidism   HLD (hyperlipidemia)   Acute on chronic diastolic CHF (congestive heart failure) ([place])   1. Chronic atrial fibrillation with rapid ventricular response. Possibly precipitated by discontinuation of rate control medications during last hospitalization due to hypotension. Cardizem and Toprol are being adjusted. The patient did have significant sinus pauses yesterday. Beta blockers have been discontinued. He is continued on Cardizem. Cardiology following and assisting in managing medications. ? Need for PPM. 2. Diabetes. Hold metformin. Continue on sliding scale insulin. Blood sugars have been stable 3. Hypertension. Blood pressures currently stable. Continue current treatments. 4. Chronic kidney disease stage III. Creatinine stable. Continue to monitor. 5. Acute on Chronic diastolic congestive heart failure. Volume status stable. Continue on oral lasix. 6. Hypothyroidism. Continue Synthroid   DVT prophylaxis: coumadin Code Status: full code Family Communication: no family present Disposition Plan: discharge home once improved   Consultants:   cardiology  Procedures:     Antimicrobials:         Subjective: No chest pain or palpitations  Objective: Vitals:   10/28/16 0500 10/28/16 0714 10/28/16 1112 10/28/16 1612  BP:      Pulse:      Resp:  (!) 23 (!) 22 18  Temp:  (!) 97.5 F (36.4 C) 97.7 F (36.5 C) 98.1 F (36.7 C)  TempSrc:  Oral Oral Oral  SpO2:      Weight: 97.5 kg (214 lb 15.2 oz)     Height:        Intake/Output Summary (Last 24 hours) at 10/28/16 1652 Last data filed at 10/28/16 0500  Gross per 24 hour  Intake                0 ml  Output              650 ml  Net             -650 ml   Filed Weights   10/25/16 0500 10/27/16 0500 10/28/16 0500  Weight: 98.1 kg (216 lb 4.3 oz) 96.6 kg (212 lb 15.4 oz) 97.5 kg (214 lb 15.2 oz)    Examination:  General exam: Alert, awake, oriented x 3 Respiratory system: Clear to auscultation. Respiratory effort normal. Cardiovascular system: irregular. No murmurs, rubs, gallops. Gastrointestinal system: Abdomen is nondistended, soft and nontender. No organomegaly or masses felt. Normal bowel sounds heard. Central nervous system: Alert and oriented. No focal neurological deficits. Extremities: 1+ edema bilaterally Skin: No rashes, lesions or ulcers Psychiatry: Judgement and insight appear normal. Mood & affect appropriate.   .  Data Reviewed: I have personally reviewed following labs and imaging studies  CBC:  Recent Labs Lab 10/22/16 1100 10/23/16 0434 10/24/16 0423 10/25/16 0452 10/28/16 0448  WBC 8.3 10.2 7.1 7.0 9.6  NEUTROABS  6.1  --   --   --   --   HGB 13.7 12.9* 12.8* 12.5* 11.6*  HCT 41.3 39.4 38.7* 37.6* 35.2*  MCV 91.4 92.9 91.3 91.3 91.9  PLT 275 258 278 295 734   Basic Metabolic Panel:  Recent Labs Lab 10/22/16 1100 10/24/16 0423 10/25/16 0452 10/26/16 0433 10/27/16 0424  NA 136 135 134* 135 138  K 3.6 3.8 3.7 3.8 3.8  CL 99* 97* 95* 97* 101  CO2 28 31 31 30 29   GLUCOSE 149* 131* 135* 126* 136*  BUN 12 13 20  22* 23*  CREATININE 1.15 1.17 1.28* 1.17 1.15  CALCIUM 9.2  8.7* 8.6* 8.7* 8.8*   GFR: Estimated Creatinine Clearance: 61 mL/min (by C-G formula based on SCr of 1.15 mg/dL). Liver Function Tests:  Recent Labs Lab 10/22/16 1100  AST 44*  ALT 135*  ALKPHOS 243*  BILITOT 2.3*  PROT 6.6  ALBUMIN 3.7   No results for input(s): LIPASE, AMYLASE in the last 168 hours. No results for input(s): AMMONIA in the last 168 hours. Coagulation Profile:  Recent Labs Lab 10/24/16 0423 10/25/16 0452 10/26/16 0433 10/27/16 0424 10/28/16 0448  INR 1.76 2.20 2.06 2.02 2.39   Cardiac Enzymes:  Recent Labs Lab 10/22/16 1100  TROPONINI <0.03   BNP (last 3 results) No results for input(s): PROBNP in the last 8760 hours. HbA1C: No results for input(s): HGBA1C in the last 72 hours. CBG:  Recent Labs Lab 10/27/16 1616 10/27/16 2057 10/28/16 0714 10/28/16 1114 10/28/16 1611  GLUCAP 97 149* 150* 184* 108*   Lipid Profile: No results for input(s): CHOL, HDL, LDLCALC, TRIG, CHOLHDL, LDLDIRECT in the last 72 hours. Thyroid Function Tests: No results for input(s): TSH, T4TOTAL, FREET4, T3FREE, THYROIDAB in the last 72 hours. Anemia Panel: No results for input(s): VITAMINB12, FOLATE, FERRITIN, TIBC, IRON, RETICCTPCT in the last 72 hours. Sepsis Labs: No results for input(s): PROCALCITON, LATICACIDVEN in the last 168 hours.  Recent Results (from the past 240 hour(s))  MRSA PCR Screening     Status: None   Collection Time: 10/22/16  4:15 PM  Result Value Ref Range Status   MRSA by PCR NEGATIVE NEGATIVE Final    Comment:        The GeneXpert MRSA Assay (FDA approved for NASAL specimens only), is one component of a comprehensive MRSA colonization surveillance program. It is not intended to diagnose MRSA infection nor to guide or monitor treatment for MRSA infections.          Radiology Studies: No results found.      Scheduled Meds: . aspirin EC  81 mg Oral Daily  . diltiazem  360 mg Oral Daily  . docusate sodium  100 mg  Oral BID  . furosemide  40 mg Oral Daily  . insulin aspart  0-15 Units Subcutaneous TID WC  . insulin aspart  0-5 Units Subcutaneous QHS  . levothyroxine  50 mcg Oral QAC breakfast  . metoprolol tartrate  25 mg Oral BID  . warfarin  3 mg Oral Once  . Warfarin - Pharmacist Dosing Inpatient   Does not apply q1800   Continuous Infusions: . diltiazem (CARDIZEM) infusion Stopped (10/24/16 1300)     LOS: 6 days    Time spent: 20mins    MEMON,JEHANZEB, MD [facility] Pager [phone]  If 7PM-7AM, please contact night-coverage [email or web address] Password [facility] 10/28/2016, 4:52 PM

## 2016-10-29 LAB — BASIC METABOLIC PANEL
Anion gap: 11 (ref 5–15)
BUN: 28 mg/dL — AB (ref 6–20)
CALCIUM: 8.8 mg/dL — AB (ref 8.9–10.3)
CO2: 30 mmol/L (ref 22–32)
CREATININE: 1.24 mg/dL (ref 0.61–1.24)
Chloride: 97 mmol/L — ABNORMAL LOW (ref 101–111)
GFR calc Af Amer: >60 mL/min (ref >60)
GFR, EST NON AFRICAN AMERICAN: 53 mL/min — AB (ref >60)
GLUCOSE: 140 mg/dL — AB (ref 65–99)
Potassium: 4.1 mmol/L (ref 3.5–5.1)
Sodium: 138 mmol/L (ref 135–145)

## 2016-10-29 LAB — CBC
HCT: 34.4 % — ABNORMAL LOW (ref 39.0–52.0)
Hemoglobin: 11.6 g/dL — ABNORMAL LOW (ref 13.0–17.0)
MCH: 31.2 pg (ref 26.0–34.0)
MCHC: 33.7 g/dL (ref 30.0–36.0)
MCV: 92.5 fL (ref 78.0–100.0)
Platelets: 304 10*3/uL (ref 150–400)
RBC: 3.72 MIL/uL — ABNORMAL LOW (ref 4.22–5.81)
RDW: 13.8 % (ref 11.5–15.5)
WBC: 7.9 10*3/uL (ref 4.0–10.5)

## 2016-10-29 LAB — GLUCOSE, CAPILLARY
GLUCOSE-CAPILLARY: 144 mg/dL — AB (ref 65–99)
GLUCOSE-CAPILLARY: 131 mg/dL — AB (ref 65–99)
Glucose-Capillary: 164 mg/dL — ABNORMAL HIGH (ref 65–99)
Glucose-Capillary: 103 mg/dL — ABNORMAL HIGH (ref 65–99)

## 2016-10-29 LAB — PROTIME-INR
INR: 2.49
PROTHROMBIN TIME: 27.4 s — AB (ref 11.4–15.2)

## 2016-10-29 MED ORDER — METOPROLOL TARTRATE 25 MG PO TABS
25.0000 mg | ORAL_TABLET | Freq: Once | ORAL | Status: AC
  Administered 2016-10-29: 25 mg via ORAL
  Filled 2016-10-29: qty 1

## 2016-10-29 MED ORDER — WARFARIN SODIUM 2 MG PO TABS
3.0000 mg | ORAL_TABLET | Freq: Once | ORAL | Status: AC
  Administered 2016-10-29: 3 mg via ORAL
  Filled 2016-10-29: qty 1

## 2016-10-29 MED ORDER — METOPROLOL TARTRATE 50 MG PO TABS
50.0000 mg | ORAL_TABLET | Freq: Two times a day (BID) | ORAL | Status: DC
  Administered 2016-10-29 – 2016-10-31 (×4): 50 mg via ORAL
  Filled 2016-10-29 (×4): qty 1

## 2016-10-29 NOTE — Progress Notes (Signed)
Brett [name]    Brett [name]  RSW:[id number] DOB: 12-14-1932 DOA: 10/22/2016 PCP: Patient, No Pcp Per    Brief Narrative:  83 year old male who was recently discharged from hospital after being treated for choledocholithiasis status post biliary sphincterotomy and stone extraction. During that hospitalization, his blood pressure was noted to be running low and his rate control medications were held. He was readmitted to the hospital with atrial fibrillation. Currently, rate control medications are being adjusted. He is anticoagulated with coumadin. Cardiology following   Assessment & Plan:   Active Problems:   Type 2 diabetes mellitus with diabetic nephropathy (HCC)   Hypertension   Atrial fibrillation with RVR (HCC)   Chronic kidney disease (CKD), stage III (moderate)   Hypothyroidism   HLD (hyperlipidemia)   Acute on chronic diastolic CHF (congestive heart failure) ([place])   1. Chronic atrial fibrillation with rapid ventricular response. Possibly precipitated by discontinuation of rate control medications during last hospitalization due to hypotension. Cardizem and Toprol are being adjusted. The patient has had sinus pauses.Cardiology following and assisting in managing medications. He is on cardizem CD 360mg  daily. Metoprolol is being adjusted. 2. Diabetes. Hold metformin. Continue on sliding scale insulin. Blood sugars have been stable 3. Hypertension. Blood pressures currently stable. Continue current treatments. 4. Chronic kidney disease stage III. Creatinine stable. Continue to monitor. 5. Acute on Chronic diastolic congestive heart failure. Volume status stable. Continue on oral lasix. 6. Hypothyroidism. Continue Synthroid   DVT prophylaxis: coumadin Code Status: full code Family Communication: no family present Disposition Plan: discharge home once improved, transfer to telemetry   Consultants:   cardiology  Procedures:     Antimicrobials:        Subjective: No new complaints  Objective: Vitals:   10/29/16 0700 10/29/16 0757 10/29/16 0800 10/29/16 1100  BP: 117/77  118/86   Pulse:      Resp: 20  (!) 22   Temp:  97.6 F (36.4 C)  97.7 F (36.5 C)  TempSrc:  Oral  Oral  SpO2:      Weight:      Height:        Intake/Output Summary (Last 24 hours) at 10/29/16 1343 Last data filed at 10/29/16 1200  Gross per 24 hour  Intake              480 ml  Output              300 ml  Net              180 ml   Filed Weights   10/27/16 0500 10/28/16 0500 10/29/16 0500  Weight: 96.6 kg (212 lb 15.4 oz) 97.5 kg (214 lb 15.2 oz) 97.1 kg (214 lb 1.1 oz)    Examination:  General exam: Alert, awake, oriented x 3 Respiratory system: Clear to auscultation. Respiratory effort normal. Cardiovascular system: irregular. No murmurs, rubs, gallops. Gastrointestinal system: Abdomen is nondistended, soft and nontender. No organomegaly or masses felt. Normal bowel sounds heard. Central nervous system: Alert and oriented. No focal neurological deficits. Extremities: 1+ edema bilaterally Skin: No rashes, lesions or ulcers Psychiatry: Judgement and insight appear normal. Mood & affect appropriate.   .  Data Reviewed: I have personally reviewed following labs and imaging studies  CBC:  Recent Labs Lab 10/23/16 0434 10/24/16 0423 10/25/16 0452 10/28/16 0448 10/29/16 0517  WBC 10.2 7.1 7.0 9.6 7.9  HGB 12.9* 12.8* 12.5* 11.6* 11.6*  HCT 39.4 38.7* 37.6* 35.2* 34.4*  MCV 92.9 91.3 91.3  91.9 92.5  PLT 258 278 295 298 696   Basic Metabolic Panel:  Recent Labs Lab 10/24/16 0423 10/25/16 0452 10/26/16 0433 10/27/16 0424 10/29/16 0517  NA 135 134* 135 138 138  K 3.8 3.7 3.8 3.8 4.1  CL 97* 95* 97* 101 97*  CO2 31 31 30 29 30   GLUCOSE 131* 135* 126* 136* 140*  BUN 13 20 22* 23* 28*  CREATININE 1.17 1.28* 1.17 1.15 1.24  CALCIUM 8.7* 8.6* 8.7* 8.8* 8.8*   GFR: Estimated Creatinine Clearance: 56.4 mL/min (by C-G formula based  on SCr of 1.24 mg/dL). Liver Function Tests: No results for input(s): AST, ALT, ALKPHOS, BILITOT, PROT, ALBUMIN in the last 168 hours. No results for input(s): LIPASE, AMYLASE in the last 168 hours. No results for input(s): AMMONIA in the last 168 hours. Coagulation Profile:  Recent Labs Lab 10/25/16 0452 10/26/16 0433 10/27/16 0424 10/28/16 0448 10/29/16 0517  INR 2.20 2.06 2.02 2.39 2.49   Cardiac Enzymes: No results for input(s): CKTOTAL, CKMB, CKMBINDEX, TROPONINI in the last 168 hours. BNP (last 3 results) No results for input(s): PROBNP in the last 8760 hours. HbA1C: No results for input(s): HGBA1C in the last 72 hours. CBG:  Recent Labs Lab 10/28/16 1114 10/28/16 1611 10/28/16 2058 10/29/16 0728 10/29/16 1143  GLUCAP 184* 108* 138* 144* 164*   Lipid Profile: No results for input(s): CHOL, HDL, LDLCALC, TRIG, CHOLHDL, LDLDIRECT in the last 72 hours. Thyroid Function Tests: No results for input(s): TSH, T4TOTAL, FREET4, T3FREE, THYROIDAB in the last 72 hours. Anemia Panel: No results for input(s): VITAMINB12, FOLATE, FERRITIN, TIBC, IRON, RETICCTPCT in the last 72 hours. Sepsis Labs: No results for input(s): PROCALCITON, LATICACIDVEN in the last 168 hours.  Recent Results (from the past 240 hour(s))  MRSA PCR Screening     Status: None   Collection Time: 10/22/16  4:15 PM  Result Value Ref Range Status   MRSA by PCR NEGATIVE NEGATIVE Final    Comment:        The GeneXpert MRSA Assay (FDA approved for NASAL specimens only), is one component of a comprehensive MRSA colonization surveillance program. It is not intended to diagnose MRSA infection nor to guide or monitor treatment for MRSA infections.          Radiology Studies: No results found.      Scheduled Meds: . aspirin EC  81 mg Oral Daily  . diltiazem  360 mg Oral Daily  . docusate sodium  100 mg Oral BID  . furosemide  40 mg Oral Daily  . insulin aspart  0-15 Units Subcutaneous TID  WC  . insulin aspart  0-5 Units Subcutaneous QHS  . levothyroxine  50 mcg Oral QAC breakfast  . metoprolol tartrate  50 mg Oral BID  . warfarin  3 mg Oral Once  . Warfarin - Pharmacist Dosing Inpatient   Does not apply q1800   Continuous Infusions: . diltiazem (CARDIZEM) infusion Stopped (10/24/16 1300)     LOS: 7 days    Time spent: 1mins    Delrose Rohwer, MD [facility] Pager [phone]  If 7PM-7AM, please contact night-coverage [email or web address] Password [email or web address] 10/29/2016, 1:43 PM

## 2016-10-29 NOTE — Care Management Note (Signed)
Case Management Note  Patient Details  Name: FAOLAN SPRINGFIELD MRN: [id number] Date of Birth: 05/31/1932  Status of Service:  In process, will continue to follow  If discussed at Long Length of Stay Meetings, dates discussed:   10/29/2016 Additional Comments:  Lafonda Patron, Chauncey Reading, RN 10/29/2016, 10:36 AM

## 2016-10-29 NOTE — Progress Notes (Signed)
[place] for COUMADIN Indication: atrial fibrillation  No Known Allergies  Patient Measurements: Height: 6' (182.9 cm) Weight: 214 lb 1.1 oz (97.1 kg) IBW/kg (Calculated) : 77.6 HEPARIN DW (KG): 96.6  Vital Signs: Temp: 97.6 F (36.4 C) (07/17 0757) Temp Source: Oral (07/17 0757) BP: 118/86 (07/17 0800)  Labs:  Recent Labs  10/27/16 0424 10/28/16 0448 10/29/16 0517  HGB  --  11.6* 11.6*  HCT  --  35.2* 34.4*  PLT  --  298 304  LABPROT 23.2* 26.5* 27.4*  INR 2.02 2.39 2.49  CREATININE 1.15  --  1.24   Estimated Creatinine Clearance: 56.4 mL/min (by C-G formula based on SCr of 1.24 mg/dL).  Medical History: Past Medical History:  Diagnosis Date  . Atrial fibrillation ([place])    On coumadin  . Chronic kidney disease (CKD), stage III (moderate)   . Dysrhythmia    AFib  . Essential hypertension, benign   . History of cardiac catheterization    Details not certain - reportedly no interventions  . Hypothyroidism   . Macular degeneration   . Pulmonary hypertension ([place]) 12/03/2013   PA pressure 56. Ejection fraction 55-60%  . Type 2 diabetes mellitus (HCC)    Medications:  Prescriptions Prior to Admission  Medication Sig Dispense Refill Last Dose  . aspirin EC 81 MG tablet Take 1 tablet (81 mg total) by mouth daily.   10/22/2016 at Unknown time  . diltiazem (CARTIA XT) 120 MG 24 hr capsule Take 1 capsule (120 mg total) by mouth daily. 90 capsule 3 10/21/2016 at Unknown time  . diphenhydrAMINE (BENADRYL) 50 MG tablet Take 50 mg by mouth at bedtime as needed for sleep.   10/21/2016 at Unknown time  . docusate sodium (COLACE) 100 MG capsule Take 100 mg by mouth 2 (two) times daily.   Past Week at Unknown time  . doxazosin (CARDURA) 2 MG tablet Take 1 tablet (2 mg total) by mouth at bedtime. 90 tablet 3 10/21/2016 at Unknown time  . levothyroxine (SYNTHROID, LEVOTHROID) 50 MCG tablet Take 50 mcg by mouth daily.   10/22/2016 at Unknown time   . metFORMIN (GLUCOPHAGE) 500 MG tablet Take 500 mg by mouth 2 (two) times daily with a meal.    10/22/2016 at Unknown time  . metoprolol succinate (TOPROL-XL) 50 MG 24 hr tablet Take 1 tablet (50 mg total) by mouth daily. Take with or immediately following a meal. 90 tablet 3 10/22/2016 at 0600  . warfarin (COUMADIN) 3 MG tablet Take 1 tablet (3 mg total) by mouth daily at 6 PM. 30 tablet 0 10/21/2016 at 2300   Assessment: 83 yo man on coumadin for afib.  He was admitted 10/14/16 with elevated INR and required Vitamin K 5mg  IV on 7/4.  He had ERCP on 7/5 and coumadin was restarted 7/8.  INR remains at therapeutic level.  No bleeding noted.  Goal of Therapy:  INR 2-3 Monitor platelets by anticoagulation protocol: Yes   Plan:  Coumadin 3mg  today x 1 Daily PT/INR. Monitor for bleeding complications  Pricilla Larsson 10/29/2016,11:22 AM

## 2016-10-29 NOTE — Progress Notes (Signed)
Progress Note  Patient Name: Brett [name] Date of Encounter: 10/29/2016  Primary Cardiologist: Bronson Ing  Subjective   No complaints of chest pain. Easily fatigued with minimal exertion.   Inpatient Medications    Scheduled Meds: . aspirin EC  81 mg Oral Daily  . diltiazem  360 mg Oral Daily  . docusate sodium  100 mg Oral BID  . furosemide  40 mg Oral Daily  . insulin aspart  0-15 Units Subcutaneous TID WC  . insulin aspart  0-5 Units Subcutaneous QHS  . levothyroxine  50 mcg Oral QAC breakfast  . metoprolol tartrate  25 mg Oral BID  . Warfarin - Pharmacist Dosing Inpatient   Does not apply q1800   Continuous Infusions: . diltiazem (CARDIZEM) infusion Stopped (10/24/16 1300)   PRN Meds: senna-docusate, zolpidem   Vital Signs    Vitals:   10/29/16 0600 10/29/16 0700 10/29/16 0757 10/29/16 0800  BP: 115/72 117/77  118/86  Pulse:      Resp: (!) 27 20  (!) 22  Temp:   97.6 F (36.4 C)   TempSrc:   Oral   SpO2:      Weight:      Height:        Intake/Output Summary (Last 24 hours) at 10/29/16 0838 Last data filed at 10/29/16 0500  Gross per 24 hour  Intake                0 ml  Output              300 ml  Net             -300 ml   Filed Weights   10/27/16 0500 10/28/16 0500 10/29/16 0500  Weight: 212 lb 15.4 oz (96.6 kg) 214 lb 15.2 oz (97.5 kg) 214 lb 1.1 oz (97.1 kg)    Telemetry    Atrial fib rates in the 120-140's  - Personally Reviewed  Physical Exam   GEN: No acute distress.   Neck: No JVD Cardiac: IRRR, tachycardic, no murmurs, rubs, or gallops.  Respiratory: Clear to auscultation bilaterally. GI: Soft, nontender, non-distended  MS: No edema; No deformity. Neuro:  Nonfocal  Psych: Normal affect   Labs    Chemistry Recent Labs Lab 10/22/16 1100  10/26/16 0433 10/27/16 0424 10/29/16 0517  NA 136  < > 135 138 138  K 3.6  < > 3.8 3.8 4.1  CL 99*  < > 97* 101 97*  CO2 28  < > 30 29 30   GLUCOSE 149*  < > 126* 136* 140*  BUN 12   < > 22* 23* 28*  CREATININE 1.15  < > 1.17 1.15 1.24  CALCIUM 9.2  < > 8.7* 8.8* 8.8*  PROT 6.6  --   --   --   --   ALBUMIN 3.7  --   --   --   --   AST 44*  --   --   --   --   ALT 135*  --   --   --   --   ALKPHOS 243*  --   --   --   --   BILITOT 2.3*  --   --   --   --   GFRNONAA 58*  < > 57* 58* 53*  GFRAA >60  < > >60 >60 >60  ANIONGAP 9  < > 8 8 11   < > = values in this interval not displayed.  Hematology Recent Labs Lab 10/25/16 0452 10/28/16 0448 10/29/16 0517  WBC 7.0 9.6 7.9  RBC 4.12* 3.83* 3.72*  HGB 12.5* 11.6* 11.6*  HCT 37.6* 35.2* 34.4*  MCV 91.3 91.9 92.5  MCH 30.3 30.3 31.2  MCHC 33.2 33.0 33.7  RDW 13.5 13.7 13.8  PLT 295 298 304    Cardiac Enzymes Recent Labs Lab 10/22/16 1100  TROPONINI <0.03   No results for input(s): TROPIPOC in the last 168 hours.   BNP Recent Labs Lab 10/22/16 1100  BNP 406.0*     DDimer No results for input(s): DDIMER in the last 168 hours.   Radiology    No results found.  Cardiac Studies  Echocardiogram 12/10/2013 Procedure narrative: Patient in rapid atrial fibrillation. - Left ventricle: Systolic function was normal. The estimated ejection fraction was in the range of 50% to 55%. Wall motion was normal; there were no regional wall motion abnormalities. - Mitral valve: Mildly calcified annulus. Mildly thickened leaflets . There was trivial regurgitation. - Left atrium: No evidence of thrombus in the atrial cavity or appendage. Normal velocities noted. - Atrial septum: No defect or patent foramen ovale was identified.  Patient Profile     83 y.o. male history of CAD with PCI of large 1st diagonal on 08/25/2007 with balloon angioplasty (no stent placed). Had 30-40% mid RCA disease and 20-30% mid LAD disease. LCx with only luminal irregularities, performed by Dr. Chad Cordial in [place], [place]. Also history of chronic diastolic HF,permanent afib. He has recently been off coumadin for ERCP and  sphincertomy for recent admission with symptomatic gallstones. Presents with generalized fatigue. In Er found to be in afib with RVR.  Assessment & Plan    1. Atrial fib with RVR: Has not tolerated titration of AV nodal blockers, as this caused hypotension and pauses. Now on diltiazem 360 mg po daily, and metoprolol 25 mg BID.  Consideration for PPM in the setting of tachybrady syndrome was discussed by Dr. Harrington Challenger with the patient.  He is willing to proceed with this. Continues on coumadin. Timing of PPM insertion, if this is recommended will have to be arranged around coumadin dosing. CHADS VASC score of 4.   2. Hypertension: BP is soft but controlled and improved with decreased dose of metoprolol to 25 mg BID.   3. Diabetes: Followed by PCP team  4. Chronic Diastolic CHF: Weight is stable. Some dependent edema noted.    Signed, Phill Myron. West Pugh, ANP, AACC   10/29/2016, 8:38 AM    The patient was seen and examined, and I agree with the history, physical exam, assessment and plan as documented above, with modifications as noted below. Pt well known to me from clinic. I have reviewed the admission hospitalist's note and subsequent notes.  The primary problem has been rapid atrial fibrillation limited in part by soft BP and pauses.  He denies chest pain. He fatigues with minimal exertion. HR remains poorly controlled on long-acting diltiazem 360 mg daily and metoprolol tartrate 25 mg bid. There were 2.2-2.8 second pauses, the longest occurring at 3 pm on 7/16. He was asymptomatic.  I will increase metoprolol to 50 mg bid and monitor BP. He can be transferred to the 3rd floor with telemetry.   Kate Sable, MD, [facility]  10/29/2016 10:16 AM

## 2016-10-30 DIAGNOSIS — E1121 Type 2 diabetes mellitus with diabetic nephropathy: Secondary | ICD-10-CM

## 2016-10-30 DIAGNOSIS — E038 Other specified hypothyroidism: Secondary | ICD-10-CM

## 2016-10-30 DIAGNOSIS — E785 Hyperlipidemia, unspecified: Secondary | ICD-10-CM

## 2016-10-30 DIAGNOSIS — I10 Essential (primary) hypertension: Secondary | ICD-10-CM

## 2016-10-30 DIAGNOSIS — N183 Chronic kidney disease, stage 3 (moderate): Secondary | ICD-10-CM

## 2016-10-30 LAB — BASIC METABOLIC PANEL
ANION GAP: 9 (ref 5–15)
BUN: 23 mg/dL — ABNORMAL HIGH (ref 6–20)
CHLORIDE: 97 mmol/L — AB (ref 101–111)
CO2: 28 mmol/L (ref 22–32)
Calcium: 8.7 mg/dL — ABNORMAL LOW (ref 8.9–10.3)
Creatinine, Ser: 1.31 mg/dL — ABNORMAL HIGH (ref 0.61–1.24)
GFR calc Af Amer: 57 mL/min — ABNORMAL LOW (ref >60)
GFR, EST NON AFRICAN AMERICAN: 49 mL/min — AB (ref >60)
GLUCOSE: 146 mg/dL — AB (ref 65–99)
POTASSIUM: 4 mmol/L (ref 3.5–5.1)
Sodium: 134 mmol/L — ABNORMAL LOW (ref 135–145)

## 2016-10-30 LAB — GLUCOSE, CAPILLARY
GLUCOSE-CAPILLARY: 138 mg/dL — AB (ref 65–99)
Glucose-Capillary: 142 mg/dL — ABNORMAL HIGH (ref 65–99)
Glucose-Capillary: 100 mg/dL — ABNORMAL HIGH (ref 65–99)
Glucose-Capillary: 144 mg/dL — ABNORMAL HIGH (ref 65–99)

## 2016-10-30 LAB — PROTIME-INR
INR: 2.29
Prothrombin Time: 25.6 seconds — ABNORMAL HIGH (ref 11.4–15.2)

## 2016-10-30 MED ORDER — WARFARIN SODIUM 2 MG PO TABS
3.0000 mg | ORAL_TABLET | Freq: Once | ORAL | Status: AC
  Administered 2016-10-30: 3 mg via ORAL
  Filled 2016-10-30: qty 1

## 2016-10-30 NOTE — Progress Notes (Signed)
[facility] for COUMADIN Indication: atrial fibrillation  No Known Allergies  Patient Measurements: Height: 6' (182.9 cm) Weight: 214 lb 1.1 oz (97.1 kg) IBW/kg (Calculated) : 77.6 HEPARIN DW (KG): 96.6  Vital Signs: Temp: 97.9 F (36.6 C) (07/18 0611) Temp Source: Oral (07/18 0611) BP: 116/83 (07/18 9150) Pulse Rate: 122 (07/18 0611)  Labs:  Recent Labs  10/28/16 0448 10/29/16 0517 10/30/16 0631  HGB 11.6* 11.6*  --   HCT 35.2* 34.4*  --   PLT 298 304  --   LABPROT 26.5* 27.4* 25.6*  INR 2.39 2.49 2.29  CREATININE  --  1.24 1.31*   Estimated Creatinine Clearance: 53.4 mL/min (A) (by C-G formula based on SCr of 1.31 mg/dL (H)).  Medical History: Past Medical History:  Diagnosis Date  . Atrial fibrillation ([place])    On coumadin  . Chronic kidney disease (CKD), stage III (moderate)   . Dysrhythmia    AFib  . Essential hypertension, benign   . History of cardiac catheterization    Details not certain - reportedly no interventions  . Hypothyroidism   . Macular degeneration   . Pulmonary hypertension ([place]) 12/03/2013   PA pressure 56. Ejection fraction 55-60%  . Type 2 diabetes mellitus (HCC)    Medications:  Prescriptions Prior to Admission  Medication Sig Dispense Refill Last Dose  . aspirin EC 81 MG tablet Take 1 tablet (81 mg total) by mouth daily.   10/22/2016 at Unknown time  . diltiazem (CARTIA XT) 120 MG 24 hr capsule Take 1 capsule (120 mg total) by mouth daily. 90 capsule 3 10/21/2016 at Unknown time  . diphenhydrAMINE (BENADRYL) 50 MG tablet Take 50 mg by mouth at bedtime as needed for sleep.   10/21/2016 at Unknown time  . docusate sodium (COLACE) 100 MG capsule Take 100 mg by mouth 2 (two) times daily.   Past Week at Unknown time  . doxazosin (CARDURA) 2 MG tablet Take 1 tablet (2 mg total) by mouth at bedtime. 90 tablet 3 10/21/2016 at Unknown time  . levothyroxine (SYNTHROID, LEVOTHROID) 50 MCG tablet Take 50 mcg by  mouth daily.   10/22/2016 at Unknown time  . metFORMIN (GLUCOPHAGE) 500 MG tablet Take 500 mg by mouth 2 (two) times daily with a meal.    10/22/2016 at Unknown time  . metoprolol succinate (TOPROL-XL) 50 MG 24 hr tablet Take 1 tablet (50 mg total) by mouth daily. Take with or immediately following a meal. 90 tablet 3 10/22/2016 at 0600  . warfarin (COUMADIN) 3 MG tablet Take 1 tablet (3 mg total) by mouth daily at 6 PM. 30 tablet 0 10/21/2016 at 2300   Assessment: 83 yo man on coumadin for afib.  He was admitted 10/14/16 with elevated INR and required Vitamin K 5mg  IV on 7/4.  He had ERCP on 7/5 and coumadin was restarted 7/8.  INR remains at therapeutic level.  No bleeding noted.  Goal of Therapy:  INR 2-3 Monitor platelets by anticoagulation protocol: Yes   Plan:  Coumadin 3mg  today x 1 Daily PT/INR. Monitor for bleeding complications  Pricilla Larsson 10/30/2016,11:06 AM

## 2016-10-30 NOTE — Progress Notes (Addendum)
Progress Note  Patient Name: Brett [name] Date of Encounter: 10/30/2016  Primary Cardiologist: Dr. Bronson Ing  Subjective   Frustrated with being in hospital. No dizziness with pauses.  Inpatient Medications    Scheduled Meds: . aspirin EC  81 mg Oral Daily  . diltiazem  360 mg Oral Daily  . docusate sodium  100 mg Oral BID  . furosemide  40 mg Oral Daily  . insulin aspart  0-15 Units Subcutaneous TID WC  . insulin aspart  0-5 Units Subcutaneous QHS  . levothyroxine  50 mcg Oral QAC breakfast  . metoprolol tartrate  50 mg Oral BID  . Warfarin - Pharmacist Dosing Inpatient   Does not apply q1800   Continuous Infusions: . diltiazem (CARDIZEM) infusion Stopped (10/24/16 1300)   PRN Meds: senna-docusate, zolpidem   Vital Signs    Vitals:   10/29/16 1700 10/29/16 2003 10/29/16 2031 10/30/16 0611  BP: 115/80 (!) 130/95  116/83  Pulse:  (!) 118  (!) 122  Resp: 18 20  20   Temp:  97.7 F (36.5 C)  97.9 F (36.6 C)  TempSrc:  Oral  Oral  SpO2:  98% 97% 96%  Weight:      Height:        Intake/Output Summary (Last 24 hours) at 10/30/16 0800 Last data filed at 10/29/16 1800  Gross per 24 hour  Intake              720 ml  Output                0 ml  Net              720 ml   Filed Weights   10/27/16 0500 10/28/16 0500 10/29/16 0500  Weight: 212 lb 15.4 oz (96.6 kg) 214 lb 15.2 oz (97.5 kg) 214 lb 1.1 oz (97.1 kg)    Telemetry    Afib at 126/m this am currently in bed but was stirring around. 2-3 sec pauses yest 1:00-3:00 patient thinks he may have been sleeping - Personally Reviewed  ECG       Physical Exam    GEN: No acute distress.   Neck: No JVD Cardiac: Irregular rhythm, no murmurs, rubs, or gallops.  Respiratory: Clear to auscultation bilaterally. GI: Soft, nontender, non-distended  MS: No edema; No deformity. Neuro:  Nonfocal  Psych: Normal affect   Labs    Chemistry Recent Labs Lab 10/27/16 0424 10/29/16 0517 10/30/16 0631  NA 138  138 134*  K 3.8 4.1 4.0  CL 101 97* 97*  CO2 29 30 28   GLUCOSE 136* 140* 146*  BUN 23* 28* 23*  CREATININE 1.15 1.24 1.31*  CALCIUM 8.8* 8.8* 8.7*  GFRNONAA 58* 53* 49*  GFRAA >60 >60 57*  ANIONGAP 8 11 9      Hematology Recent Labs Lab 10/25/16 0452 10/28/16 0448 10/29/16 0517  WBC 7.0 9.6 7.9  RBC 4.12* 3.83* 3.72*  HGB 12.5* 11.6* 11.6*  HCT 37.6* 35.2* 34.4*  MCV 91.3 91.9 92.5  MCH 30.3 30.3 31.2  MCHC 33.2 33.0 33.7  RDW 13.5 13.7 13.8  PLT 295 298 304    Cardiac EnzymesNo results for input(s): TROPONINI in the last 168 hours. No results for input(s): TROPIPOC in the last 168 hours.   BNPNo results for input(s): BNP, PROBNP in the last 168 hours.   DDimer No results for input(s): DDIMER in the last 168 hours.   Radiology    No results found.  Cardiac  Studies   Echocardiogram 12/10/2013 Procedure narrative: Patient in rapid atrial fibrillation. - Left ventricle: Systolic function was normal. The estimated   ejection fraction was in the range of 50% to 55%. Wall motion was   normal; there were no regional wall motion abnormalities. - Mitral valve: Mildly calcified annulus. Mildly thickened leaflets   . There was trivial regurgitation. - Left atrium: No evidence of thrombus in the atrial cavity or   appendage. Normal velocities noted. - Atrial septum: No defect or patent foramen ovale was identified.        Patient Profile     83 y.o. male history of CAD with PCI of large 1st diagonal on 08/25/2007 with balloon angioplasty (no stent placed). Had 30-40% mid RCA disease and 20-30% mid LAD disease. LCx with only luminal irregularities, performed by Dr. Chad Cordial in [place], [place]. Also history of chronic diastolic HF,permanent afib. He has recently been off coumadin for ERCP and sphincertomy for recent admission with symptomatic gallstones. Presents with generalized fatigue. In Er found to be in afib with RVR.      Assessment & Plan    1. Atrial fib  with RVR: has had some pauses 2-3 sec yest at rest.Now HR 126/m at rest after moving around this am. CHADS VASC score of 4. Metoprolol increased yest 50 mg BID but only received 1 dose yesterday.Also on cardizem 240 mg daily. ? Watch on current dose of metoprolol today since he only received one dose of 50 mg yest.   2. Hypertension: BP is soft     3. Diabetes: Followed by PCP team   4. Chronic Diastolic CHF: Weight is stable.  Not on diuretic. Crt up a bit today.      Signed, Ermalinda Barrios, PA-C  10/30/2016, 8:00 AM    Pt seen and examined and labs and telemetry reviewed. Remains mildly tachycardic but rates are somewhat better controlled. He is asymptomatic. Currently on long-acting diltiazem 360 mg daily and metoprolol 50 mg bid. If he remains asymptomatic and avg HR's < 120, would discharge with EP follow up.

## 2016-10-30 NOTE — Progress Notes (Signed)
PROGRESS NOTE    Brett [name]  WCH:[id number] DOB: [birth year] DOA: 10/22/2016 PCP: Patient, No Pcp Per    Brief Narrative:  83 year old male who presented to the hospital with a chief complaint of palpitations. Patient is known to have chronic atrial fibrillation, chronic kidney disease stage III, diabetes mellitus type 2 and hypothyroidism. He had a recent hospitalization for choledocholithiasis status post biliary sphincterotomy and stone extraction. His cardiac medications then were modified due to low blood pressure, diltiazem was held, beta blocker dose was reduced. On initial physical examination his blood pressure 121/82, heart rate 115, 129, respiratory rate 18, oxygen saturation 98%. Moist mucous membranes, lungs clear to auscultation bilaterally, heart S1-S2 present irregularly irregular, abdomen was soft nontender, no lower extremity edema.   Patient was admitted to the hospital with working diagnosis of decompensated chronic atrial fibrillation, rapid ventricular response.   Assessment & Plan:   Active Problems:   Type 2 diabetes mellitus with diabetic nephropathy (HCC)   Hypertension   Atrial fibrillation with RVR (HCC)   Chronic kidney disease (CKD), stage III (moderate)   Hypothyroidism   HLD (hyperlipidemia)   Acute on chronic diastolic CHF (congestive heart failure) ([place])   1. Atrial fibrillation with rapid ventricular response. Will continue av blockade with metoprolol and diltiazem, continue telemetry monitoring, telemetry personally reviewed noted 2 to 3 seconds pauses, patient asymptomatic. He had cardioversion in the past. Metoprolol 50 mg bid and diltiazem 360 mg. Continue anticoagulation with warfarin.   2. HTN. Continue metoprolol with good toleration  3. T2Dm. Patient tolerating po well, will continue iss for glucose cover and monitoring, capillary glucose 103, 131, 142, 138.   4. Hypothyroid. Will continue levothyroxine, TSH 2.0 on 07/06. Clinically  euthyroid.    DVT prophylaxis: wafarin Code Status: full  Family Communication:  Disposition Plan:    Consultants:   Cardiology   Procedures:     Antimicrobials:    Subjective: Patient with no dyspnea or palpitations, no nausea or vomiting, no edema.   Objective: Vitals:   10/29/16 1700 10/29/16 2003 10/29/16 2031 10/30/16 0611  BP: 115/80 (!) 130/95  116/83  Pulse:  (!) 118  (!) 122  Resp: 18 20  20   Temp:  97.7 F (36.5 C)  97.9 F (36.6 C)  TempSrc:  Oral  Oral  SpO2:  98% 97% 96%  Weight:      Height:        Intake/Output Summary (Last 24 hours) at 10/30/16 0932 Last data filed at 10/29/16 1800  Gross per 24 hour  Intake              480 ml  Output                0 ml  Net              480 ml   Filed Weights   10/27/16 0500 10/28/16 0500 10/29/16 0500  Weight: 96.6 kg (212 lb 15.4 oz) 97.5 kg (214 lb 15.2 oz) 97.1 kg (214 lb 1.1 oz)    Examination:  General exam: not in pain or dyspnea E ENT: no pallor or icterus, oral mucosa moist.  Respiratory system: Clear to auscultation. Respiratory effort normal. Cardiovascular system: S1 & S2 heard, irregullary-irregular. No JVD, murmurs, rubs, gallops or clicks. No pedal edema. Gastrointestinal system: Abdomen is nondistended, soft and nontender. No organomegaly or masses felt. Normal bowel sounds heard. Central nervous system: Alert and oriented. No focal neurological deficits. Extremities: Symmetric 5 x 5  power. Skin: No rashes, lesions or ulcers     Data Reviewed: I have personally reviewed following labs and imaging studies  CBC:  Recent Labs Lab 10/24/16 0423 10/25/16 0452 10/28/16 0448 10/29/16 0517  WBC 7.1 7.0 9.6 7.9  HGB 12.8* 12.5* 11.6* 11.6*  HCT 38.7* 37.6* 35.2* 34.4*  MCV 91.3 91.3 91.9 92.5  PLT 278 295 298 712   Basic Metabolic Panel:  Recent Labs Lab 10/25/16 0452 10/26/16 0433 10/27/16 0424 10/29/16 0517 10/30/16 0631  NA 134* 135 138 138 134*  K 3.7 3.8 3.8 4.1  4.0  CL 95* 97* 101 97* 97*  CO2 31 30 29 30 28   GLUCOSE 135* 126* 136* 140* 146*  BUN 20 22* 23* 28* 23*  CREATININE 1.28* 1.17 1.15 1.24 1.31*  CALCIUM 8.6* 8.7* 8.8* 8.8* 8.7*   GFR: Estimated Creatinine Clearance: 53.4 mL/min (A) (by C-G formula based on SCr of 1.31 mg/dL (H)). Liver Function Tests: No results for input(s): AST, ALT, ALKPHOS, BILITOT, PROT, ALBUMIN in the last 168 hours. No results for input(s): LIPASE, AMYLASE in the last 168 hours. No results for input(s): AMMONIA in the last 168 hours. Coagulation Profile:  Recent Labs Lab 10/26/16 0433 10/27/16 0424 10/28/16 0448 10/29/16 0517 10/30/16 0631  INR 2.06 2.02 2.39 2.49 2.29   Cardiac Enzymes: No results for input(s): CKTOTAL, CKMB, CKMBINDEX, TROPONINI in the last 168 hours. BNP (last 3 results) No results for input(s): PROBNP in the last 8760 hours. HbA1C: No results for input(s): HGBA1C in the last 72 hours. CBG:  Recent Labs Lab 10/29/16 0728 10/29/16 1143 10/29/16 1609 10/29/16 2048 10/30/16 0748  GLUCAP 144* 164* 103* 131* 142*   Lipid Profile: No results for input(s): CHOL, HDL, LDLCALC, TRIG, CHOLHDL, LDLDIRECT in the last 72 hours. Thyroid Function Tests: No results for input(s): TSH, T4TOTAL, FREET4, T3FREE, THYROIDAB in the last 72 hours. Anemia Panel: No results for input(s): VITAMINB12, FOLATE, FERRITIN, TIBC, IRON, RETICCTPCT in the last 72 hours. Sepsis Labs: No results for input(s): PROCALCITON, LATICACIDVEN in the last 168 hours.  Recent Results (from the past 240 hour(s))  MRSA PCR Screening     Status: None   Collection Time: 10/22/16  4:15 PM  Result Value Ref Range Status   MRSA by PCR NEGATIVE NEGATIVE Final    Comment:        The GeneXpert MRSA Assay (FDA approved for NASAL specimens only), is one component of a comprehensive MRSA colonization surveillance program. It is not intended to diagnose MRSA infection nor to guide or monitor treatment for MRSA  infections.          Radiology Studies: No results found.      Scheduled Meds: . aspirin EC  81 mg Oral Daily  . diltiazem  360 mg Oral Daily  . docusate sodium  100 mg Oral BID  . furosemide  40 mg Oral Daily  . insulin aspart  0-15 Units Subcutaneous TID WC  . insulin aspart  0-5 Units Subcutaneous QHS  . levothyroxine  50 mcg Oral QAC breakfast  . metoprolol tartrate  50 mg Oral BID  . Warfarin - Pharmacist Dosing Inpatient   Does not apply q1800   Continuous Infusions: . diltiazem (CARDIZEM) infusion Stopped (10/24/16 1300)     LOS: 8 days        Mauricio Gerome Apley, MD [facility] Pager [phone]  If 7PM-7AM, please contact night-coverage [email or web address] Password [facility] 10/30/2016, 9:32 AM

## 2016-10-31 ENCOUNTER — Other Ambulatory Visit: Payer: Self-pay | Admitting: Adult Health

## 2016-10-31 ENCOUNTER — Ambulatory Visit (INDEPENDENT_AMBULATORY_CARE_PROVIDER_SITE_OTHER): Payer: Medicare HMO

## 2016-10-31 ENCOUNTER — Other Ambulatory Visit: Payer: Self-pay

## 2016-10-31 DIAGNOSIS — I4891 Unspecified atrial fibrillation: Secondary | ICD-10-CM

## 2016-10-31 DIAGNOSIS — I482 Chronic atrial fibrillation, unspecified: Secondary | ICD-10-CM

## 2016-10-31 LAB — BASIC METABOLIC PANEL
ANION GAP: 8 (ref 5–15)
BUN: 23 mg/dL — ABNORMAL HIGH (ref 6–20)
CALCIUM: 8.8 mg/dL — AB (ref 8.9–10.3)
CO2: 30 mmol/L (ref 22–32)
CREATININE: 1.31 mg/dL — AB (ref 0.61–1.24)
Chloride: 98 mmol/L — ABNORMAL LOW (ref 101–111)
GFR, EST AFRICAN AMERICAN: 57 mL/min — AB (ref >60)
GFR, EST NON AFRICAN AMERICAN: 49 mL/min — AB (ref >60)
Glucose, Bld: 137 mg/dL — ABNORMAL HIGH (ref 65–99)
Potassium: 4.2 mmol/L (ref 3.5–5.1)
SODIUM: 136 mmol/L (ref 135–145)

## 2016-10-31 LAB — GLUCOSE, CAPILLARY
GLUCOSE-CAPILLARY: 131 mg/dL — AB (ref 65–99)
Glucose-Capillary: 222 mg/dL — ABNORMAL HIGH (ref 65–99)

## 2016-10-31 LAB — PROTIME-INR
INR: 2.17
PROTHROMBIN TIME: 24.5 s — AB (ref 11.4–15.2)

## 2016-10-31 LAB — CBC
HCT: 35.6 % — ABNORMAL LOW (ref 39.0–52.0)
Hemoglobin: 12 g/dL — ABNORMAL LOW (ref 13.0–17.0)
MCH: 31 pg (ref 26.0–34.0)
MCHC: 33.7 g/dL (ref 30.0–36.0)
MCV: 92 fL (ref 78.0–100.0)
PLATELETS: 306 10*3/uL (ref 150–400)
RBC: 3.87 MIL/uL — ABNORMAL LOW (ref 4.22–5.81)
RDW: 13.9 % (ref 11.5–15.5)
WBC: 6.2 10*3/uL (ref 4.0–10.5)

## 2016-10-31 LAB — MAGNESIUM: Magnesium: 2.1 mg/dL (ref 1.7–2.4)

## 2016-10-31 MED ORDER — METOPROLOL TARTRATE 25 MG PO TABS
25.0000 mg | ORAL_TABLET | Freq: Once | ORAL | Status: AC
  Administered 2016-10-31: 25 mg via ORAL
  Filled 2016-10-31: qty 1

## 2016-10-31 MED ORDER — METOPROLOL TARTRATE 50 MG PO TABS: 75.0000 mg | ORAL_TABLET | Freq: Two times a day (BID) | ORAL | Status: DC

## 2016-10-31 MED ORDER — DILTIAZEM HCL ER COATED BEADS 360 MG PO CP24: 360.0000 mg | ORAL_CAPSULE | Freq: Every day | ORAL | 0 refills | Status: DC

## 2016-10-31 MED ORDER — WARFARIN SODIUM 2 MG PO TABS: 3.0000 mg | ORAL_TABLET | Freq: Once | ORAL | Status: DC

## 2016-10-31 MED ORDER — METOPROLOL TARTRATE 75 MG PO TABS: 75.0000 mg | ORAL_TABLET | Freq: Two times a day (BID) | ORAL | 0 refills | Status: DC

## 2016-10-31 NOTE — Progress Notes (Signed)
Clinic LPN came in and educated patient about his home heart monitor. She placed the monitor on the patient.

## 2016-10-31 NOTE — Care Management Note (Signed)
Case Management Note  Patient Details  Name: HURSCHEL PAYNTER MRN: [id number] Date of Birth: 1932/09/26      Status of Service:  In process, will continue to follow  If discussed at Long Length of Stay Meetings, dates discussed:  10/31/2016  Additional Comments:  Johnny Latu, Chauncey Reading, RN 10/31/2016, 10:32 AM

## 2016-10-31 NOTE — Progress Notes (Signed)
Discharge instructions gone over with patient and his significant other, verbalized understanding. IV's removed, patient tolerated procedure well. Patient given printed list of local primary care physicians and has ensured me that he will call and set up an appointment with one to establish care.

## 2016-10-31 NOTE — Discharge Summary (Signed)
Physician Discharge Summary  MACHI WHITTAKER XLK:[id number] DOB: 06-22-1932 DOA: 10/22/2016  PCP: Patient, No Pcp Per  Admit date: 10/22/2016 Discharge date: 10/31/2016  Admitted From: Home Disposition:  Home   Recommendations for Outpatient Follow-up:  1. Follow up with PCP in 1- week 2. Follow up EP cardiology in 7 days 3. Patient placed on one week event monitor 4. Diltiazem has been increased to 360 mg per day 5. Metoprolol (tartrate) has been increased to 75 mg po bid 6. Patient has been instructed to avoid driving and situations where losing consciousness can be dangerous, like climbing ladders or swimming. 7. Discontinue aspirin to decrease risk of bleeding  Home Health: No Equipment/Devices: One week event monitor  Discharge Condition: Stable  CODE STATUS: Full  Diet recommendation: Heart Healthy / Carb Modified    Brief/Interim Summary: 83 year old male who presented to the hospital with a chief complaint of palpitations. Patient is known to have chronic atrial fibrillation, chronic kidney disease stage III, diabetes mellitus type 2 and hypothyroidism. He had a recent hospitalization for choledocholithiasis status post biliary sphincterotomy and stone extraction. His cardiac medications then were modified due to low blood pressure, diltiazem was held, beta blocker dose was reduced. On initial physical examination his blood pressure 121/82, heart rate 115, 129, respiratory rate 18, oxygen saturation 98%. Moist mucous membranes, lungs clear to auscultation bilaterally, heart S1-S2 present irregularly irregular, abdomen was soft nontender, no lower extremity edema. Sodium 136, potassium 3.6, chloride 99, bicarbonate 28, glucose 149, BUN 12, creatinine 1.15, AST 44, ALT 135, white cell count 8.3, hemoglobin 13.7, hematocrit 41.3, platelets 275, INR 1.26, chest x-ray with increased lung markings bilaterally, cephalization of the vasculature. No infiltrates. EKG with atrial fibrillation  rate of 149 bpm, right bundle branch block.  Patient was admitted to the hospital with working diagnosis of decompensated chronic atrial fibrillation, with rapid ventricular response.  1. Atrial fibrillation with rapid ventricular response. Patient was admitted to the medical unit with a remote telemetry monitor, he was placed on intravenous infusion of diltiazem for rate control. Posteriorly he was transitioned to oral diltiazem, his dose was adjusted up to 360 mg daily, metoprolol tartrate was increased to 75 mg twice daily. He had pauses on the telemetry, the longest up to 2.9 seconds, all asymptomatic. Patient was diuresed during his hospital stay with furosemide, achieving euvolemic state. His heart rate still has been uncontrolled, the highest about 128, lowest about 96 over last 12 hours. He was deemed stable to be discharged home with a 1 week event monitor, and close follow-up as an outpatient with cardiology/electrophysiology in 7 days. Patient was continued on warfarin for anticoagulation, his INR remained therapeutic. Discharge INR 2.1. Patient was instructed not to drive until follow-up as an outpatient, avoid situations where losing consciousness can be dangerous. We will stop aspirin to decrease risk of bleeding.   2. Hypertension. Blood pressure tolerated well up titration of AV blockade agents, discharge systolic blood pressure 366 and 116. Patient with normovolemia.   3. Type 2 diabetes mellitus. Patient was placed on insulin sliding scale for glucose coverage and monitoring, oral hypoglycemic agents were held, his capillary glucose went well controlled between 103 and 142.   4. Hypothyroidism. She was continued on thyroxine hormone replacement with no major complications, his TSH was within normal range early this month. Clinically euthyroid.   5. BPH. Continue doxazosin.    Discharge Diagnoses:  Active Problems:   Type 2 diabetes mellitus with diabetic nephropathy ([place])  Hypertension   Atrial fibrillation with RVR ([facility])   Chronic kidney disease (CKD), stage III (moderate)   Hypothyroidism   HLD (hyperlipidemia)   Acute on chronic diastolic CHF (congestive heart failure) ([place])    Discharge Instructions   Allergies as of 10/31/2016   No Known Allergies     Medication List    STOP taking these medications   aspirin EC 81 MG tablet   diphenhydrAMINE 50 MG tablet Commonly known as:  BENADRYL   metoprolol succinate 50 MG 24 hr tablet Commonly known as:  TOPROL-XL     TAKE these medications   diltiazem 360 MG 24 hr capsule Commonly known as:  CARDIZEM CD Take 1 capsule (360 mg total) by mouth daily. What changed:  medication strength  how much to take   docusate sodium 100 MG capsule Commonly known as:  COLACE Take 100 mg by mouth 2 (two) times daily.   doxazosin 2 MG tablet Commonly known as:  CARDURA Take 1 tablet (2 mg total) by mouth at bedtime.   levothyroxine 50 MCG tablet Commonly known as:  SYNTHROID, LEVOTHROID Take 50 mcg by mouth daily.   metFORMIN 500 MG tablet Commonly known as:  GLUCOPHAGE Take 500 mg by mouth 2 (two) times daily with a meal.   Metoprolol Tartrate 75 MG Tabs Take 75 mg by mouth 2 (two) times daily.   warfarin 3 MG tablet Commonly known as:  COUMADIN Take 1 tablet (3 mg total) by mouth daily at 6 PM.       No Known Allergies  Consultations:  Cardiology    Procedures/Studies: US Abdomen Complete  Result Date: 10/15/2016 CLINICAL DATA:  Abdominal pain for 1 day, elevated liver function tests, prior cholecystectomy, diabetes and hypertension EXAM: ABDOMEN ULTRASOUND COMPLETE COMPARISON:  CT abdomen pelvis of 10/15/2016 FINDINGS: Gallbladder: The gallbladder has previously been resected. No pain is present over the right upper quadrant. Common bile duct: Diameter: The common bile duct measures 8 mm in diameter which is within normal limits post cholecystectomy. Liver: The liver is  inhomogeneous and echogenic consistent with diffuse fatty infiltration. Slight prominence of intrahepatic ducts is noted. , as was present on recent CT of the abdomen pelvis. IVC: No abnormality visualized. Pancreas: The pancreas is not well seen due to bowel gas but appear normal on recent CT. Spleen: The spleen measures 7.9 cm. Right Kidney: Length: 11.0 cm.  No hydronephrosis is seen. Left Kidney: Length: 12.3 cm. A cyst emanates from the inferior left kidney anteriorly of 2.9 cm. Abdominal aorta: Much of the abdominal aorta is obscured by bowel gas. Other findings: None. IMPRESSION: 1. Echogenic liver parenchyma consistent with fatty infiltration. 2. Intrahepatic biliary ductal dilatation as was present on recent CT of the abdomen pelvis in this patient post cholecystectomy. 3. The pancreas and abdominal aorta are obscured by bowel gas. 4. No hydronephrosis. Electronically Signed   By: Ivar Drape M.D.   On: 10/15/2016 09:39   Ct Abdomen Pelvis W Contrast  Result Date: 10/15/2016 CLINICAL DATA:  Acute onset of severe generalized abdominal pain, nausea and cold chills. Initial encounter. EXAM: CT ABDOMEN AND PELVIS WITH CONTRAST TECHNIQUE: Multidetector CT imaging of the abdomen and pelvis was performed using the standard protocol following bolus administration of intravenous contrast. CONTRAST:  170mL ISOVUE-300 IOPAMIDOL (ISOVUE-300) INJECTION 61% COMPARISON:  CT of the abdomen and pelvis, and MRCP performed 12/01/2013 FINDINGS: Lower chest: Diffuse coronary artery calcifications are seen. The visualized lung bases are grossly clear. Hepatobiliary: There is diffuse prominence  of the intrahepatic biliary ducts, within normal limits status post cholecystectomy. Clips are noted at the gallbladder fossa. The liver is otherwise unremarkable. The common bile duct is prominent, but likely within normal limits status post cholecystectomy. Pancreas: The pancreas is within normal limits. Spleen: The spleen is  unremarkable in appearance. Adrenals/Urinary Tract: The adrenal glands are unremarkable in appearance. Nonspecific perinephric stranding is noted bilaterally. There is no evidence of hydronephrosis. No renal or ureteral stones are identified. A left renal cyst is noted. Stomach/Bowel: The stomach is unremarkable in appearance. The small bowel is within normal limits. The appendix is normal in caliber, without evidence of appendicitis. Scattered diverticulosis is noted along the sigmoid colon, without evidence of diverticulitis. Vascular/Lymphatic: Scattered calcification is seen along the abdominal aorta and its branches. The abdominal aorta is otherwise grossly unremarkable. The inferior vena cava is grossly unremarkable. No retroperitoneal lymphadenopathy is seen. No pelvic sidewall lymphadenopathy is identified. Reproductive: The bladder is mildly distended and grossly remarkable. The prostate remains normal in size. Other:  A small left inguinal hernia is noted, containing only fat. Musculoskeletal: No acute osseous abnormalities are identified. Vacuum phenomenon is noted at the upper lumbar spine. Bilateral hip arthroplasties appear grossly intact. The visualized musculature is unremarkable in appearance. IMPRESSION: 1. No acute abnormality seen to explain the patient's symptoms. 2. Scattered aortic atherosclerosis. 3. Diverticulosis along the sigmoid colon, without evidence of diverticulitis. 4. Diffuse coronary artery calcifications seen. 5. Left renal cyst noted. 6. Small left inguinal hernia, containing only fat. Electronically Signed   By: Garald Balding M.D.   On: 10/15/2016 02:06   Mr 3d Recon At Scanner  Result Date: 10/17/2016 CLINICAL DATA:  Right upper quadrant pain, prior cholecystectomy, hyperbilirubinemia EXAM: MRI ABDOMEN WITHOUT AND WITH CONTRAST (INCLUDING MRCP) TECHNIQUE: Multiplanar multisequence MR imaging of the abdomen was performed both before and after the administration of  intravenous contrast. Heavily T2-weighted images of the biliary and pancreatic ducts were obtained, and three-dimensional MRCP images were rendered by post processing. CONTRAST:  79mL MULTIHANCE GADOBENATE DIMEGLUMINE 529 MG/ML IV SOLN COMPARISON:  12/01/2013 FINDINGS: Motion degraded images. Lower chest: Lung bases are clear. Hepatobiliary: 10 mm cyst in segment 8 of the liver (series 4/image 19). No suspicious/enhancing hepatic lesions. No hepatic steatosis. Status post cholecystectomy.  No intrahepatic duct dilatation. Dilated common duct, measuring up to 13 mm. Three mid/distal CBD stones measuring up to 15 mm (series 5/images 6-7). Pancreas:  Within normal limits. Spleen:  Within normal limits. Adrenals/Urinary Tract:  Adrenal glands are within normal limits. 2.8 cm anterior left lower pole renal cyst. Right kidney is within normal limits. No hydronephrosis. Stomach/Bowel: Stomach is within normal limits. Visualized bowel is unremarkable. Vascular/Lymphatic:  No evidence of abdominal aortic aneurysm. No suspicious abdominal lymphadenopathy. Other:  No abdominal ascites. Musculoskeletal: No focal osseous lesions. IMPRESSION: Motion degraded images. Status post cholecystectomy. Dilated CBD, measuring 13 mm. Choledocholithiasis with three mid/distal CBD stones measuring up to 15 mm. ERCP is suggested. Additional ancillary findings as above. Electronically Signed   By: Julian Hy M.D.   On: 10/17/2016 08:47   Dg Chest Portable 1 View  Result Date: 10/22/2016 CLINICAL DATA:  Weakness.  Atrial fibrillation. EXAM: PORTABLE CHEST 1 VIEW COMPARISON:  PA and lateral chest 08/22/2016. FINDINGS: Calcified left upper lobe granuloma is unchanged. The lungs are otherwise clear. Heart size is normal. No pneumothorax or pleural effusion. No acute bony abnormality. IMPRESSION: No acute disease. Electronically Signed   By: Inge Rise M.D.   On: 10/22/2016 11:24  Dg Ercp Biliary & Pancreatic Ducts  Result  Date: 10/17/2016 CLINICAL DATA:  83 year old male with a history of choledocholithiasis EXAM: ERCP TECHNIQUE: Multiple spot images obtained with the fluoroscopic device and submitted for interpretation post-procedure. FLUOROSCOPY TIME:  Fluoroscopy Time:  2 minutes 48 seconds COMPARISON:  MR 10/17/2016, CT 10/15/2016 FINDINGS: Multiple limited intraoperative fluoroscopic spot images during ERCP. Initial image demonstrates endoscope projecting over the upper abdomen. Surgical clips in place of prior cholecystectomy. Subsequently there is cannulation of the ampulla and retrograde infusion of contrast. Multiple rounded filling defects present within the extrahepatic biliary system. There is then deployment of a balloon basket. No stent on the final image. IMPRESSION: Limited images during ERCP demonstrates treatment for choledocholithiasis. Please refer to the dictated operative report for full details of intraoperative findings and procedure. Electronically Signed   By: Corrie Mckusick D.O.   On: 10/17/2016 13:26   Mr Abdomen Mrcp W Wo Contast  Result Date: 10/17/2016 CLINICAL DATA:  Right upper quadrant pain, prior cholecystectomy, hyperbilirubinemia EXAM: MRI ABDOMEN WITHOUT AND WITH CONTRAST (INCLUDING MRCP) TECHNIQUE: Multiplanar multisequence MR imaging of the abdomen was performed both before and after the administration of intravenous contrast. Heavily T2-weighted images of the biliary and pancreatic ducts were obtained, and three-dimensional MRCP images were rendered by post processing. CONTRAST:  58mL MULTIHANCE GADOBENATE DIMEGLUMINE 529 MG/ML IV SOLN COMPARISON:  12/01/2013 FINDINGS: Motion degraded images. Lower chest: Lung bases are clear. Hepatobiliary: 10 mm cyst in segment 8 of the liver (series 4/image 19). No suspicious/enhancing hepatic lesions. No hepatic steatosis. Status post cholecystectomy.  No intrahepatic duct dilatation. Dilated common duct, measuring up to 13 mm. Three mid/distal CBD stones  measuring up to 15 mm (series 5/images 6-7). Pancreas:  Within normal limits. Spleen:  Within normal limits. Adrenals/Urinary Tract:  Adrenal glands are within normal limits. 2.8 cm anterior left lower pole renal cyst. Right kidney is within normal limits. No hydronephrosis. Stomach/Bowel: Stomach is within normal limits. Visualized bowel is unremarkable. Vascular/Lymphatic:  No evidence of abdominal aortic aneurysm. No suspicious abdominal lymphadenopathy. Other:  No abdominal ascites. Musculoskeletal: No focal osseous lesions. IMPRESSION: Motion degraded images. Status post cholecystectomy. Dilated CBD, measuring 13 mm. Choledocholithiasis with three mid/distal CBD stones measuring up to 15 mm. ERCP is suggested. Additional ancillary findings as above. Electronically Signed   By: Julian Hy M.D.   On: 10/17/2016 08:47       Subjective: Patient feeling better, no nausea or vomiting, no chest pain, dyspnea or palpitations, has been ambulting.   Discharge Exam: Vitals:   10/30/16 2046 10/31/16 0424  BP: 125/87 116/77  Pulse: (!) 125 (!) 120  Resp: 18 18  Temp: 97.6 F (36.4 C) 97.6 F (36.4 C)   Vitals:   10/30/16 0611 10/30/16 1400 10/30/16 2046 10/31/16 0424  BP: 116/83 109/78 125/87 116/77  Pulse: (!) 122 (!) 101 (!) 125 (!) 120  Resp: 20 18 18 18   Temp: 97.9 F (36.6 C) 98.1 F (36.7 C) 97.6 F (36.4 C) 97.6 F (36.4 C)  TempSrc: Oral  Oral Oral  SpO2: 96% 98% 97% 100%  Weight:      Height:        General: Pt is alert, awake, not in acute distress E ENT; no pallor or icterus, oral mucosa moist Cardiovascular: irregulary-irregular, S1/S2 +, no rubs, no gallops Respiratory: CTA bilaterally, no wheezing, no rhonchi Abdominal: Soft, NT, ND, bowel sounds + Extremities: no edema, no cyanosis    The results of significant diagnostics from this hospitalization (  including imaging, microbiology, ancillary and laboratory) are listed below for reference.      Microbiology: Recent Results (from the past 240 hour(s))  MRSA PCR Screening     Status: None   Collection Time: 10/22/16  4:15 PM  Result Value Ref Range Status   MRSA by PCR NEGATIVE NEGATIVE Final    Comment:        The GeneXpert MRSA Assay (FDA approved for NASAL specimens only), is one component of a comprehensive MRSA colonization surveillance program. It is not intended to diagnose MRSA infection nor to guide or monitor treatment for MRSA infections.      Labs: BNP (last 3 results)  Recent Labs  10/22/16 1100  BNP 478.2*   Basic Metabolic Panel:  Recent Labs Lab 10/26/16 0433 10/27/16 0424 10/29/16 0517 10/30/16 0631 10/31/16 0527  NA 135 138 138 134* 136  K 3.8 3.8 4.1 4.0 4.2  CL 97* 101 97* 97* 98*  CO2 30 29 30 28 30   GLUCOSE 126* 136* 140* 146* 137*  BUN 22* 23* 28* 23* 23*  CREATININE 1.17 1.15 1.24 1.31* 1.31*  CALCIUM 8.7* 8.8* 8.8* 8.7* 8.8*  MG  --   --   --   --  2.1   Liver Function Tests: No results for input(s): AST, ALT, ALKPHOS, BILITOT, PROT, ALBUMIN in the last 168 hours. No results for input(s): LIPASE, AMYLASE in the last 168 hours. No results for input(s): AMMONIA in the last 168 hours. CBC:  Recent Labs Lab 10/25/16 0452 10/28/16 0448 10/29/16 0517 10/31/16 0527  WBC 7.0 9.6 7.9 6.2  HGB 12.5* 11.6* 11.6* 12.0*  HCT 37.6* 35.2* 34.4* 35.6*  MCV 91.3 91.9 92.5 92.0  PLT 295 298 304 306   Cardiac Enzymes: No results for input(s): CKTOTAL, CKMB, CKMBINDEX, TROPONINI in the last 168 hours. BNP: Invalid input(s): POCBNP CBG:  Recent Labs Lab 10/30/16 1137 10/30/16 1615 10/30/16 2012 10/31/16 0739 10/31/16 1116  GLUCAP 138* 100* 144* 131* 222*   D-Dimer No results for input(s): DDIMER in the last 72 hours. Hgb A1c No results for input(s): HGBA1C in the last 72 hours. Lipid Profile No results for input(s): CHOL, HDL, LDLCALC, TRIG, CHOLHDL, LDLDIRECT in the last 72 hours. Thyroid function studies No  results for input(s): TSH, T4TOTAL, T3FREE, THYROIDAB in the last 72 hours.  Invalid input(s): FREET3 Anemia work up No results for input(s): VITAMINB12, FOLATE, FERRITIN, TIBC, IRON, RETICCTPCT in the last 72 hours. Urinalysis    Component Value Date/Time   COLORURINE AMBER (A) 10/15/2016 0320   APPEARANCEUR CLEAR 10/15/2016 0320   LABSPEC >1.046 (H) 10/15/2016 0320   PHURINE 5.0 10/15/2016 0320   GLUCOSEU 50 (A) 10/15/2016 0320   HGBUR MODERATE (A) 10/15/2016 0320   BILIRUBINUR NEGATIVE 10/15/2016 0320   KETONESUR 5 (A) 10/15/2016 0320   PROTEINUR NEGATIVE 10/15/2016 0320   UROBILINOGEN 0.2 12/01/2013 0740   NITRITE NEGATIVE 10/15/2016 0320   LEUKOCYTESUR NEGATIVE 10/15/2016 0320   Sepsis Labs Invalid input(s): PROCALCITONIN,  WBC,  LACTICIDVEN Microbiology Recent Results (from the past 240 hour(s))  MRSA PCR Screening     Status: None   Collection Time: 10/22/16  4:15 PM  Result Value Ref Range Status   MRSA by PCR NEGATIVE NEGATIVE Final    Comment:        The GeneXpert MRSA Assay (FDA approved for NASAL specimens only), is one component of a comprehensive MRSA colonization surveillance program. It is not intended to diagnose MRSA infection nor to guide or monitor  treatment for MRSA infections.      Time coordinating discharge: 45  minutes  SIGNED:   Tawni Millers, MD  [facility] 10/31/2016, 11:31 AM Pager   If 7PM-7AM, please contact night-coverage [email or web address] Password [email or web address]

## 2016-10-31 NOTE — Care Management Important Message (Signed)
Important Message  Patient Details  Name: Brett [name] MRN: [id number] Date of Birth: [birth year]   Medicare Important Message Given:  Yes    Tahiri Shareef, Chauncey Reading, RN 10/31/2016, 11:43 AM

## 2016-10-31 NOTE — Progress Notes (Signed)
Progress Note  Patient Name: Brett [name] Date of Encounter: 10/31/2016  Primary Cardiologist: Bronson Ing  Subjective   Anxious to go home. Asymptomatic   Inpatient Medications    Scheduled Meds: . aspirin EC  81 mg Oral Daily  . diltiazem  360 mg Oral Daily  . docusate sodium  100 mg Oral BID  . furosemide  40 mg Oral Daily  . insulin aspart  0-15 Units Subcutaneous TID WC  . insulin aspart  0-5 Units Subcutaneous QHS  . levothyroxine  50 mcg Oral QAC breakfast  . metoprolol tartrate  50 mg Oral BID  . Warfarin - Pharmacist Dosing Inpatient   Does not apply q1800   Continuous Infusions: . diltiazem (CARDIZEM) infusion Stopped (10/24/16 1300)   PRN Meds: senna-docusate, zolpidem   Vital Signs    Vitals:   10/30/16 0611 10/30/16 1400 10/30/16 2046 10/31/16 0424  BP: 116/83 109/78 125/87 116/77  Pulse: (!) 122 (!) 101 (!) 125 (!) 120  Resp: 20 18 18 18   Temp: 97.9 F (36.6 C) 98.1 F (36.7 C) 97.6 F (36.4 C) 97.6 F (36.4 C)  TempSrc: Oral  Oral Oral  SpO2: 96% 98% 97% 100%  Weight:      Height:        Intake/Output Summary (Last 24 hours) at 10/31/16 0908 Last data filed at 10/30/16 1900  Gross per 24 hour  Intake              480 ml  Output                0 ml  Net              480 ml   Filed Weights   10/27/16 0500 10/28/16 0500 10/29/16 0500  Weight: 212 lb 15.4 oz (96.6 kg) 214 lb 15.2 oz (97.5 kg) 214 lb 1.1 oz (97.1 kg)    Telemetry    Atrial fib rates between 115 bpm and 130 bpm. No significant pauses. Personally Reviewed  Physical Exam   GEN: No acute distress.   Neck: No JVD Cardiac: IRRR, tachycardic, no murmurs, rubs, or gallops.  Respiratory: Clear to auscultation bilaterally. GI: Soft, nontender, non-distended  MS: No edema; No deformity. Neuro:  Nonfocal  Psych: Normal affect   Labs    Chemistry Recent Labs Lab 10/29/16 0517 10/30/16 0631 10/31/16 0527  NA 138 134* 136  K 4.1 4.0 4.2  CL 97* 97* 98*  CO2 30 28  30   GLUCOSE 140* 146* 137*  BUN 28* 23* 23*  CREATININE 1.24 1.31* 1.31*  CALCIUM 8.8* 8.7* 8.8*  GFRNONAA 53* 49* 49*  GFRAA >60 57* 57*  ANIONGAP 11 9 8      Hematology Recent Labs Lab 10/28/16 0448 10/29/16 0517 10/31/16 0527  WBC 9.6 7.9 6.2  RBC 3.83* 3.72* 3.87*  HGB 11.6* 11.6* 12.0*  HCT 35.2* 34.4* 35.6*  MCV 91.9 92.5 92.0  MCH 30.3 31.2 31.0  MCHC 33.0 33.7 33.7  RDW 13.7 13.8 13.9  PLT 298 304 306    Radiology    No results found.  Cardiac Studies   Echocardiogram 12/10/2013 Procedure narrative: Patient in rapid atrial fibrillation. - Left ventricle: Systolic function was normal. The estimated ejection fraction was in the range of 50% to 55%. Wall motion was normal; there were no regional wall motion abnormalities. - Mitral valve: Mildly calcified annulus. Mildly thickened leaflets . There was trivial regurgitation. - Left atrium: No evidence of thrombus in the atrial cavity  or appendage. Normal velocities noted. - Atrial septum: No defect or patent foramen ovale was identified.   Patient Profile     83 y.o. male history of CAD with PCI of large 1st diagonal on 08/25/2007 with balloon angioplasty (no stent placed). Had 30-40% mid RCA disease and 20-30% mid LAD disease. LCx with only luminal irregularities, performed by Dr. Chad Cordial in [place], [place]. Also history of chronic diastolic HF,permanent afib. He has recently been off coumadin for ERCP and sphincertomy for recent admission with symptomatic gallstones. Presents with generalized fatigue. In Er found to be in afib with RVR.Difficult HR control since admission.   Assessment & Plan    1. Atrial fib with RVR with Hx of Permanent Atrial fib: HR is still not adequately controlled. Difficult titration of AV nodal blocking agents as increase causes pauses,and hypotension. Currently on diltiazem 360 mg po daily and metoprolol tartrate 50 mg po BID.  Remains on coumadin. Plan for EP consult for PPM  vs ablation. No evidence of CM or LVH currently per echo. Timing concerning coumadin therapy per EP. Discussion for need to transfer vs OP follow up will be discussed with Dr. Bronson Ing. Patient wants to go home.   2.Hypertension: BP controlled but soft. No titration of medications at this time.  3.  Chronic Diastolic CHF: No evidence of decompensation. Creatinine 1.31 this am on lasix 40 mg daily.   4. Hypothyroidism: On levothyroxine.  TSH 0.053 on 10/18/2016.   Signed, Phill Myron. West Pugh, ANP, AACC   10/31/2016, 9:08 AM    The patient was seen and examined, and I agree with the history, physical exam, assessment and plan as documented above, with modifications as noted below.  He is asymptomatic and wants to go home. HR's are better but not optimally controlled. He has had asymptomatic pauses earlier this week. I will increase metoprolol to 75 mg bid and continue long-acting diltiazem 360 mg daily. Would prefer to avoid digoxin. BP's are normal.  I will arrange for a one week event monitor and follow up with EP next week. If monitoring shows additional (and frequent pauses), he may need a pacemaker.  His [birth year] is tomorrow. I think he can be discharged with close follow up, which we will arrange.   Kate Sable, MD, [facility]  10/31/2016 10:25 AM

## 2016-10-31 NOTE — Progress Notes (Signed)
[place] for COUMADIN Indication: atrial fibrillation  No Known Allergies  Patient Measurements: Height: 6' (182.9 cm) Weight: 214 lb 1.1 oz (97.1 kg) IBW/kg (Calculated) : 77.6 HEPARIN DW (KG): 96.6  Vital Signs: Temp: 97.6 F (36.4 C) (07/19 0424) Temp Source: Oral (07/19 0424) BP: 116/77 (07/19 0424) Pulse Rate: 121 (07/19 1209)  Labs:  Recent Labs  10/29/16 0517 10/30/16 0631 10/31/16 0527  HGB 11.6*  --  12.0*  HCT 34.4*  --  35.6*  PLT 304  --  306  LABPROT 27.4* 25.6* 24.5*  INR 2.49 2.29 2.17  CREATININE 1.24 1.31* 1.31*   Estimated Creatinine Clearance: 53.4 mL/min (A) (by C-G formula based on SCr of 1.31 mg/dL (H)).  Medical History: Past Medical History:  Diagnosis Date  . Atrial fibrillation ([place])    On coumadin  . Chronic kidney disease (CKD), stage III (moderate)   . Dysrhythmia    AFib  . Essential hypertension, benign   . History of cardiac catheterization    Details not certain - reportedly no interventions  . Hypothyroidism   . Macular degeneration   . Pulmonary hypertension ([place]) 12/03/2013   PA pressure 56. Ejection fraction 55-60%  . Type 2 diabetes mellitus ([facility])    Medications:  Prescriptions Prior to Admission  Medication Sig Dispense Refill Last Dose  . aspirin EC 81 MG tablet Take 1 tablet (81 mg total) by mouth daily.   10/22/2016 at Unknown time  . diltiazem (CARTIA XT) 120 MG 24 hr capsule Take 1 capsule (120 mg total) by mouth daily. 90 capsule 3 10/21/2016 at Unknown time  . diphenhydrAMINE (BENADRYL) 50 MG tablet Take 50 mg by mouth at bedtime as needed for sleep.   10/21/2016 at Unknown time  . docusate sodium (COLACE) 100 MG capsule Take 100 mg by mouth 2 (two) times daily.   Past Week at Unknown time  . doxazosin (CARDURA) 2 MG tablet Take 1 tablet (2 mg total) by mouth at bedtime. 90 tablet 3 10/21/2016 at Unknown time  . levothyroxine (SYNTHROID, LEVOTHROID) 50 MCG tablet Take 50 mcg by  mouth daily.   10/22/2016 at Unknown time  . metFORMIN (GLUCOPHAGE) 500 MG tablet Take 500 mg by mouth 2 (two) times daily with a meal.    10/22/2016 at Unknown time  . metoprolol succinate (TOPROL-XL) 50 MG 24 hr tablet Take 1 tablet (50 mg total) by mouth daily. Take with or immediately following a meal. 90 tablet 3 10/22/2016 at 0600  . warfarin (COUMADIN) 3 MG tablet Take 1 tablet (3 mg total) by mouth daily at 6 PM. 30 tablet 0 10/21/2016 at 2300   Assessment: 83 yo man on coumadin for afib.  He was admitted 10/14/16 with elevated INR and required Vitamin K 5mg  IV on 7/4.  He had ERCP on 7/5 and coumadin was restarted 7/8.  INR remains at therapeutic level.  No bleeding noted.  Goal of Therapy:  INR 2-3 Monitor platelets by anticoagulation protocol: Yes   Plan:  Coumadin 3mg  today x 1 Daily PT/INR. Monitor for bleeding complications  Hart Robinsons A 10/31/2016,1:22 PM

## 2016-11-04 ENCOUNTER — Telehealth: Payer: Self-pay | Admitting: Cardiovascular Disease

## 2016-11-04 MED ORDER — METOPROLOL TARTRATE 75 MG PO TABS: 25.0000 mg | ORAL_TABLET | Freq: Two times a day (BID) | ORAL | 0 refills | Status: DC

## 2016-11-04 MED ORDER — METOPROLOL TARTRATE 75 MG PO TABS: 75.0000 mg | ORAL_TABLET | Freq: Two times a day (BID) | ORAL | 0 refills | Status: DC

## 2016-11-04 NOTE — Telephone Encounter (Signed)
Mr. Trudo called stating that he was recently in the hospital and he has questions about his  Heart medications.  States that his heart rate is up again.  Please call [phone].

## 2016-11-04 NOTE — Telephone Encounter (Signed)
Is this a Psychologist, prison and probation services or just for his specific pharmacy? It doesn't make sense they would recommend the 25mg  tablet tid for him. If they have 25mg  tablets why dont they give him enough to take 3 tablets bid until the backorder is over. His heart rates remained elevated during admission, issue is he also has low heart rates at times as well. I would continue current doses until f/u if he is not overally symptomatic   Zandra Abts MD

## 2016-11-04 NOTE — Telephone Encounter (Signed)
Patient states Metoprolol Tartrate is unavailable. Patient was given this at the hospital. Patient was previously on Lopressor. Patient states hospital gave him enough to get by but it is not controlling his HR. HR this morning was 126 Per pharmacy, Metoprolol Tartrate 75 mg is on backorder and they do not know when it will be available. Pharmacy wants to know if it is ok for patient to take Metoprolol Tartrate 25 mg TID for the time being.

## 2016-11-04 NOTE — Telephone Encounter (Signed)
Patient notified.  Verbalized understanding. 

## 2016-11-08 ENCOUNTER — Ambulatory Visit: Payer: Medicare HMO | Admitting: Nurse Practitioner

## 2016-11-08 ENCOUNTER — Ambulatory Visit: Payer: Medicare HMO | Admitting: Gastroenterology

## 2016-11-11 ENCOUNTER — Telehealth: Payer: Self-pay | Admitting: Cardiovascular Disease

## 2016-11-11 DIAGNOSIS — I4891 Unspecified atrial fibrillation: Secondary | ICD-10-CM

## 2016-11-11 NOTE — Telephone Encounter (Signed)
Returned pt call. He stated that when he was seen in the ED, they changed his medication.His HR has been all over the place. (124, 126, & 98) Over the past few days he has noticed that his legs are swelling a lot more and is wanting to know if her can have a prescription for lasix. He did complain that he is a little more SOB. He did state that he has not gained but maybe a pound. Please advise.

## 2016-11-11 NOTE — Telephone Encounter (Signed)
Please give pt a call --has some questions concerning his afib and his medicines

## 2016-11-12 ENCOUNTER — Emergency Department (HOSPITAL_COMMUNITY): Payer: Medicare HMO

## 2016-11-12 ENCOUNTER — Inpatient Hospital Stay (HOSPITAL_COMMUNITY)
Admission: EM | Admit: 2016-11-12 | Discharge: 2016-11-16 | DRG: 308 | Disposition: A | Discharge status: Home / Self Care | Payer: Medicare HMO | Attending: Internal Medicine | Admitting: Internal Medicine

## 2016-11-12 ENCOUNTER — Encounter (HOSPITAL_COMMUNITY): Payer: Self-pay | Admitting: Emergency Medicine

## 2016-11-12 DIAGNOSIS — N183 Chronic kidney disease, stage 3 (moderate): Secondary | ICD-10-CM | POA: Diagnosis present

## 2016-11-12 DIAGNOSIS — Z79899 Other long term (current) drug therapy: Secondary | ICD-10-CM | POA: Diagnosis not present

## 2016-11-12 DIAGNOSIS — Z8249 Family history of ischemic heart disease and other diseases of the circulatory system: Secondary | ICD-10-CM | POA: Diagnosis not present

## 2016-11-12 DIAGNOSIS — I2510 Atherosclerotic heart disease of native coronary artery without angina pectoris: Secondary | ICD-10-CM | POA: Diagnosis present

## 2016-11-12 DIAGNOSIS — Z955 Presence of coronary angioplasty implant and graft: Secondary | ICD-10-CM

## 2016-11-12 DIAGNOSIS — E039 Hypothyroidism, unspecified: Secondary | ICD-10-CM | POA: Diagnosis present

## 2016-11-12 DIAGNOSIS — Z96643 Presence of artificial hip joint, bilateral: Secondary | ICD-10-CM | POA: Diagnosis present

## 2016-11-12 DIAGNOSIS — I482 Chronic atrial fibrillation: Secondary | ICD-10-CM | POA: Diagnosis not present

## 2016-11-12 DIAGNOSIS — E1121 Type 2 diabetes mellitus with diabetic nephropathy: Secondary | ICD-10-CM | POA: Diagnosis not present

## 2016-11-12 DIAGNOSIS — I351 Nonrheumatic aortic (valve) insufficiency: Secondary | ICD-10-CM | POA: Diagnosis present

## 2016-11-12 DIAGNOSIS — I4891 Unspecified atrial fibrillation: Secondary | ICD-10-CM

## 2016-11-12 DIAGNOSIS — I272 Pulmonary hypertension, unspecified: Secondary | ICD-10-CM | POA: Diagnosis not present

## 2016-11-12 DIAGNOSIS — H3530 Unspecified macular degeneration: Secondary | ICD-10-CM | POA: Diagnosis present

## 2016-11-12 DIAGNOSIS — E1122 Type 2 diabetes mellitus with diabetic chronic kidney disease: Secondary | ICD-10-CM | POA: Diagnosis not present

## 2016-11-12 DIAGNOSIS — Z87891 Personal history of nicotine dependence: Secondary | ICD-10-CM

## 2016-11-12 DIAGNOSIS — E785 Hyperlipidemia, unspecified: Secondary | ICD-10-CM | POA: Diagnosis present

## 2016-11-12 DIAGNOSIS — I5032 Chronic diastolic (congestive) heart failure: Secondary | ICD-10-CM

## 2016-11-12 DIAGNOSIS — R0600 Dyspnea, unspecified: Secondary | ICD-10-CM | POA: Diagnosis not present

## 2016-11-12 DIAGNOSIS — R000 Tachycardia, unspecified: Secondary | ICD-10-CM | POA: Diagnosis not present

## 2016-11-12 DIAGNOSIS — I4581 Long QT syndrome: Secondary | ICD-10-CM | POA: Diagnosis present

## 2016-11-12 DIAGNOSIS — I5033 Acute on chronic diastolic (congestive) heart failure: Secondary | ICD-10-CM | POA: Diagnosis not present

## 2016-11-12 DIAGNOSIS — S335XXA Sprain of ligaments of lumbar spine, initial encounter: Secondary | ICD-10-CM | POA: Diagnosis present

## 2016-11-12 DIAGNOSIS — I509 Heart failure, unspecified: Secondary | ICD-10-CM

## 2016-11-12 DIAGNOSIS — I495 Sick sinus syndrome: Secondary | ICD-10-CM | POA: Diagnosis present

## 2016-11-12 DIAGNOSIS — I10 Essential (primary) hypertension: Secondary | ICD-10-CM | POA: Diagnosis present

## 2016-11-12 DIAGNOSIS — R0602 Shortness of breath: Secondary | ICD-10-CM | POA: Diagnosis not present

## 2016-11-12 DIAGNOSIS — Z7984 Long term (current) use of oral hypoglycemic drugs: Secondary | ICD-10-CM

## 2016-11-12 DIAGNOSIS — Z7901 Long term (current) use of anticoagulants: Secondary | ICD-10-CM

## 2016-11-12 DIAGNOSIS — I130 Hypertensive heart and chronic kidney disease with heart failure and stage 1 through stage 4 chronic kidney disease, or unspecified chronic kidney disease: Secondary | ICD-10-CM | POA: Diagnosis present

## 2016-11-12 LAB — CBC WITH DIFFERENTIAL/PLATELET
BASOS ABS: 0 10*3/uL (ref 0.0–0.1)
Basophils Relative: 0 %
Eosinophils Absolute: 0.1 10*3/uL (ref 0.0–0.7)
Eosinophils Relative: 1 %
HEMATOCRIT: 38.6 % — AB (ref 39.0–52.0)
HEMOGLOBIN: 12.7 g/dL — AB (ref 13.0–17.0)
LYMPHS PCT: 23 %
Lymphs Abs: 1.5 10*3/uL (ref 0.7–4.0)
MCH: 30.5 pg (ref 26.0–34.0)
MCHC: 32.9 g/dL (ref 30.0–36.0)
MCV: 92.8 fL (ref 78.0–100.0)
MONO ABS: 0.5 10*3/uL (ref 0.1–1.0)
Monocytes Relative: 8 %
NEUTROS ABS: 4.2 10*3/uL (ref 1.7–7.7)
NEUTROS PCT: 68 %
Platelets: 189 10*3/uL (ref 150–400)
RBC: 4.16 MIL/uL — AB (ref 4.22–5.81)
RDW: 14.1 % (ref 11.5–15.5)
WBC: 6.3 10*3/uL (ref 4.0–10.5)

## 2016-11-12 LAB — GLUCOSE, CAPILLARY: GLUCOSE-CAPILLARY: 140 mg/dL — AB (ref 65–99)

## 2016-11-12 LAB — BRAIN NATRIURETIC PEPTIDE: B NATRIURETIC PEPTIDE 5: 572 pg/mL — AB (ref 0.0–100.0)

## 2016-11-12 LAB — HEPATIC FUNCTION PANEL
ALBUMIN: 3.9 g/dL (ref 3.5–5.0)
ALT: 27 U/L (ref 17–63)
AST: 22 U/L (ref 15–41)
Alkaline Phosphatase: 88 U/L (ref 38–126)
Bilirubin, Direct: 0.4 mg/dL (ref 0.1–0.5)
Indirect Bilirubin: 1.1 mg/dL — ABNORMAL HIGH (ref 0.3–0.9)
Total Bilirubin: 1.5 mg/dL — ABNORMAL HIGH (ref 0.3–1.2)
Total Protein: 6.8 g/dL (ref 6.5–8.1)

## 2016-11-12 LAB — PROTIME-INR
INR: 2.71
PROTHROMBIN TIME: 29.3 s — AB (ref 11.4–15.2)

## 2016-11-12 LAB — BASIC METABOLIC PANEL
ANION GAP: 10 (ref 5–15)
BUN: 16 mg/dL (ref 6–20)
CHLORIDE: 102 mmol/L (ref 101–111)
CO2: 24 mmol/L (ref 22–32)
Calcium: 9 mg/dL (ref 8.9–10.3)
Creatinine, Ser: 1.38 mg/dL — ABNORMAL HIGH (ref 0.61–1.24)
GFR calc Af Amer: 53 mL/min — ABNORMAL LOW (ref >60)
GFR calc non Af Amer: 46 mL/min — ABNORMAL LOW (ref >60)
GLUCOSE: 136 mg/dL — AB (ref 65–99)
POTASSIUM: 4.3 mmol/L (ref 3.5–5.1)
Sodium: 136 mmol/L (ref 135–145)

## 2016-11-12 LAB — TROPONIN I: Troponin I: <0.03 ng/mL (ref <0.03)

## 2016-11-12 LAB — TSH: TSH: 2.228 u[IU]/mL (ref 0.350–4.500)

## 2016-11-12 MED ORDER — METOPROLOL TARTRATE 25 MG PO TABS
25.0000 mg | ORAL_TABLET | Freq: Once | ORAL | Status: AC
  Administered 2016-11-12: 25 mg via ORAL
  Filled 2016-11-12: qty 1

## 2016-11-12 MED ORDER — METFORMIN HCL 500 MG PO TABS
500.0000 mg | ORAL_TABLET | Freq: Two times a day (BID) | ORAL | Status: DC
  Administered 2016-11-13 – 2016-11-16 (×7): 500 mg via ORAL
  Filled 2016-11-12 (×7): qty 1

## 2016-11-12 MED ORDER — ZOLPIDEM TARTRATE 5 MG PO TABS
5.0000 mg | ORAL_TABLET | Freq: Every evening | ORAL | Status: DC | PRN
  Administered 2016-11-12 – 2016-11-15 (×4): 5 mg via ORAL
  Filled 2016-11-12 (×4): qty 1

## 2016-11-12 MED ORDER — DOXAZOSIN MESYLATE 2 MG PO TABS
2.0000 mg | ORAL_TABLET | Freq: Every day | ORAL | Status: DC
  Administered 2016-11-12 – 2016-11-15 (×4): 2 mg via ORAL
  Filled 2016-11-12 (×4): qty 1

## 2016-11-12 MED ORDER — FUROSEMIDE 10 MG/ML IJ SOLN
20.0000 mg | Freq: Once | INTRAMUSCULAR | Status: AC
  Administered 2016-11-12: 20 mg via INTRAVENOUS
  Filled 2016-11-12: qty 2

## 2016-11-12 MED ORDER — DOCUSATE SODIUM 100 MG PO CAPS
100.0000 mg | ORAL_CAPSULE | Freq: Two times a day (BID) | ORAL | Status: DC
  Administered 2016-11-12 – 2016-11-14 (×4): 100 mg via ORAL
  Filled 2016-11-12 (×8): qty 1

## 2016-11-12 MED ORDER — AMIODARONE HCL IN DEXTROSE 360-4.14 MG/200ML-% IV SOLN
60.0000 mg/h | INTRAVENOUS | Status: AC
  Administered 2016-11-12: 60 mg/h via INTRAVENOUS
  Filled 2016-11-12 (×2): qty 200

## 2016-11-12 MED ORDER — AMIODARONE LOAD VIA INFUSION
150.0000 mg | Freq: Once | INTRAVENOUS | Status: AC
  Administered 2016-11-12: 150 mg via INTRAVENOUS
  Filled 2016-11-12: qty 83.34

## 2016-11-12 MED ORDER — FUROSEMIDE 10 MG/ML IJ SOLN: 40.0000 mg | Freq: Once | INTRAMUSCULAR | Status: DC

## 2016-11-12 MED ORDER — AMIODARONE HCL IN DEXTROSE 360-4.14 MG/200ML-% IV SOLN
30.0000 mg/h | INTRAVENOUS | Status: DC
  Administered 2016-11-12 – 2016-11-14 (×4): 30 mg/h via INTRAVENOUS
  Filled 2016-11-12 (×3): qty 200

## 2016-11-12 MED ORDER — FUROSEMIDE 40 MG PO TABS: 40.0000 mg | ORAL_TABLET | Freq: Every day | ORAL | 3 refills | Status: DC

## 2016-11-12 MED ORDER — AMIODARONE HCL IN DEXTROSE 360-4.14 MG/200ML-% IV SOLN
INTRAVENOUS | Status: AC
  Filled 2016-11-12: qty 200

## 2016-11-12 MED ORDER — METOPROLOL TARTRATE 25 MG PO TABS
50.0000 mg | ORAL_TABLET | Freq: Two times a day (BID) | ORAL | Status: AC
  Administered 2016-11-12 – 2016-11-14 (×5): 50 mg via ORAL
  Filled 2016-11-12 (×9): qty 2

## 2016-11-12 MED ORDER — LEVOTHYROXINE SODIUM 25 MCG PO TABS
50.0000 ug | ORAL_TABLET | Freq: Every day | ORAL | Status: DC
  Administered 2016-11-13 – 2016-11-16 (×4): 50 ug via ORAL
  Filled 2016-11-12 (×4): qty 2

## 2016-11-12 NOTE — ED Notes (Signed)
Meal tray given 

## 2016-11-12 NOTE — Telephone Encounter (Signed)
Called pt., no answer, left message for pt to return call. I sent in RX for lasix and put in referral to EP.

## 2016-11-12 NOTE — ED Provider Notes (Signed)
[facility] DEPT Provider Note   CSN: [id number] Arrival date & time: 11/12/16  0932     History   Chief Complaint Chief Complaint  Patient presents with  . Shortness of Breath    HPI Brett [name] is a 83 y.o. male.  HPI 83 year old male who presented to the hospital with a chief complaint of palpitations. Patient is known to have chronic atrial fibrillation, chronic kidney disease stage III, diabetes mellitus type 2 and hypothyroidism. Pt was admitted and discharged from the hospital on 7/19 for AF, prior to that he was admitted for cholecystectomy.  Pt reports that while he was in the ER, his AF meds were adjusted and he was placed on laisx. At the time, he didn't get any lasix, and overtime he has noticed worsening of the leg swelling and also increased shortness of breath. Pt now has dib with exertion - walking to the mailbox and back required him to stop, something that wasn't the case just a couple of months back. Pt has been taking his meds as prescribed. Pt reports that he called his Cardiologist office for his worsening symptoms, but his doctor was on vacation.  Past Medical History:  Diagnosis Date  . Atrial fibrillation ([place])    On coumadin  . Chronic kidney disease (CKD), stage III (moderate)   . Dysrhythmia    AFib  . Essential hypertension, benign   . History of cardiac catheterization    Details not certain - reportedly no interventions  . Hypothyroidism   . Macular degeneration   . Pulmonary hypertension ([place]) 12/03/2013   PA pressure 56. Ejection fraction 55-60%  . Type 2 diabetes mellitus [facility])     Patient Active Problem List   Diagnosis Date Noted  . Acute on chronic diastolic CHF (congestive heart failure) ([place]) 10/24/2016  . Jaundice   . Periumbilical pain   . Elevated lipase   . Coagulopathy ([place]) 10/16/2016  . Elevated liver enzymes   . Hyperbilirubinemia   . Hyponatremia   . Abdominal pain 10/15/2016  . A-fib ([place]) 02/23/2014  .  Diabetes ([place]) 02/23/2014  . BP (high blood pressure) 02/23/2014  . HLD (hyperlipidemia) 02/23/2014  . Excess weight 02/23/2014  . Chronic diastolic heart failure ([place]) 12/24/2013  . Acute bronchitis 12/06/2013  . DIC (disseminated intravascular coagulation) ([place]) 12/04/2013  . Thrombocytopenia, unspecified ([place]) 12/03/2013  . Pulmonary hypertension ([place]) 12/03/2013  . Wheezing 12/02/2013  . Obesity, morbid ([place]) 12/02/2013  . Transaminitis 12/02/2013  . Type 2 diabetes mellitus with diabetic nephropathy ([place]) 12/01/2013  . Hypertension 12/01/2013  . Atrial fibrillation with RVR ([place]) 12/01/2013  . Gall stones, common bile duct 12/01/2013  . Pancreatitis due to biliary obstruction 12/01/2013  . Chronic kidney disease (CKD), stage III (moderate) 12/01/2013  . Hypothyroidism 12/01/2013  . Cholelithiasis 12/01/2013  . History of other specified conditions presenting hazards to health 02/09/2008  . Lumbar back sprain 02/09/2008  . Herpes zoster keratoconjunctivitis 09/15/2007  . Acute systolic heart failure ([place]) 08/20/2007  . PNA (pneumonia) 08/20/2007  . Fast heart beat 08/20/2007  . Feeling bilious 08/16/2007    Past Surgical History:  Procedure Laterality Date  . CARDIOVERSION N/A 12/10/2013   Procedure: CARDIOVERSION;  Surgeon: Herminio Commons, MD;  Location: [facility] ORS;  Service: Endoscopy;  Laterality: N/A;  . CATARACT EXTRACTION W/PHACO Left 11/13/2015   Procedure: CATARACT EXTRACTION PHACO AND INTRAOCULAR LENS PLACEMENT LEFT EYE CDE=7.76;  Surgeon: Williams Che, MD;  Location: [facility] ORS;  Service: Ophthalmology;  Laterality: Left;  .  CHOLECYSTECTOMY    . COLONOSCOPY     Remote > 10 years ago  . ERCP N/A 10/17/2016   Procedure: ENDOSCOPIC RETROGRADE CHOLANGIOPANCREATOGRAPHY (ERCP);  Surgeon: Daneil Dolin, MD;  Location: [facility] ENDO SUITE;  Service: Endoscopy;  Laterality: N/A;  . REMOVAL OF STONES  10/17/2016   Procedure: REMOVAL OF STONES with balloon;  Surgeon: Daneil Dolin, MD;  Location: [facility] ENDO SUITE;  Service: Endoscopy;;  . REPLACEMENT TOTAL HIP W/  RESURFACING IMPLANTS Bilateral   . SPHINCTEROTOMY  10/17/2016   Procedure: BILLARY SPHINCTEROTOMY with ballon dilation;  Surgeon: Daneil Dolin, MD;  Location: [facility] END[facility] SUITE;  Service: Endoscopy;;  . TEE WITHOUT CARDIOVERSION N/A 12/10/2013   Procedure: TRANSESOPHAGEAL ECHOCARDIOGRAM (TEE);  Surgeon: Herminio Commons, MD;  Location: [facility] ORS;  Service: Endoscopy;  Laterality: N/A;       Home Medications    Prior to Admission medications   Medication Sig Start Date End Date Taking? Authorizing Provider  diltiazem (CARDIZEM CD) 360 MG 24 hr capsule Take 1 capsule (360 mg total) by mouth daily. 11/01/16   Arrien, Jimmy Picket, MD  docusate sodium (COLACE) 100 MG capsule Take 100 mg by mouth 2 (two) times daily.    [provider]  doxazosin (CARDURA) 2 MG tablet Take 1 tablet (2 mg total) by mouth at bedtime. 04/24/15   Herminio Commons, MD  furosemide (LASIX) 40 MG tablet Take 1 tablet (40 mg total) by mouth daily. 11/12/16 02/10/17  Herminio Commons, MD  levothyroxine (SYNTHROID, LEVOTHROID) 50 MCG tablet Take 50 mcg by mouth daily.    [provider]  metFORMIN (GLUCOPHAGE) 500 MG tablet Take 500 mg by mouth 2 (two) times daily with a meal.     [provider]  Metoprolol Tartrate 75 MG TABS Take 75 mg by mouth 2 (two) times daily. 11/04/16 12/04/16  Herminio Commons, MD  warfarin (COUMADIN) 3 MG tablet Take 1 tablet (3 mg total) by mouth daily at 6 PM. 10/20/16   Orson Eva, MD    Family History Family History  Problem Relation Age of Onset  . Hypertension Mother   . Hypertension Father   . Colon cancer Neg Hx     Social History Social History  Substance Use Topics  . Smoking status: Former Smoker    Packs/day: 1.00    Years: 37.00    Types: Cigarettes    Start date: 11/01/1952    Quit date: 08/18/1978  . Smokeless tobacco: Never Used  . Alcohol use 0.0  oz/week     Comment: Occasional     Allergies   Patient has no known allergies.   Review of Systems Review of Systems  Constitutional: Positive for activity change.  Respiratory: Positive for shortness of breath.   Cardiovascular: Negative for chest pain.  Gastrointestinal: Negative for abdominal pain.  Neurological: Negative for dizziness and syncope.  All other systems reviewed and are negative.    Physical Exam Updated Vital Signs BP (!) 134/93   Pulse (!) 107 Comment: irregular  Temp 97.9 F (36.6 C) (Oral)   Resp (!) 28   SpO2 95%   Physical Exam  Constitutional: He is oriented to person, place, and time. He appears well-developed.  HENT:  Head: Atraumatic.  Neck: Neck supple. No JVD present.  Cardiovascular: Normal rate.   No murmur heard. Pulmonary/Chest: Effort normal. No respiratory distress. He has no wheezes. He has no rales.  Right side lower lung field has mild rales  Abdominal:  Soft.  Musculoskeletal: He exhibits edema.  1+ pitting edema bilaterally  Neurological: He is alert and oriented to person, place, and time.  Skin: Skin is warm.  Nursing note and vitals reviewed.    ED Treatments / Results  Labs (all labs ordered are listed, but only abnormal results are displayed) Labs Reviewed  CBC WITH DIFFERENTIAL/PLATELET - Abnormal; Notable for the following:       Result Value   RBC 4.16 (*)    Hemoglobin 12.7 (*)    HCT 38.6 (*)    All other components within normal limits  BASIC METABOLIC PANEL - Abnormal; Notable for the following:    Glucose, Bld 136 (*)    Creatinine, Ser 1.38 (*)    GFR calc non Af Amer 46 (*)    GFR calc Af Amer 53 (*)    All other components within normal limits  BRAIN NATRIURETIC PEPTIDE - Abnormal; Notable for the following:    B Natriuretic Peptide 572.0 (*)    All other components within normal limits  PROTIME-INR - Abnormal; Notable for the following:    Prothrombin Time 29.3 (*)    All other components  within normal limits  TROPONIN I    EKG  EKG Interpretation  Date/Time:  Tuesday November 12 2016 09:45:07 EDT Ventricular Rate:  112 PR Interval:    QRS Duration: 114 QT Interval:  356 QTC Calculation: 486 R Axis:   21 Text Interpretation:  Atrial fibrillation Ventricular tachycardia, unsustained Incomplete right bundle branch block Low voltage, extremity leads Repol abnrm suggests ischemia, lateral leads No significant change since last tracing Confirmed by Varney Biles [phone]) on 11/12/2016 9:48:15 AM       Radiology Dg Chest 2 View  Result Date: 11/12/2016 CLINICAL DATA:  Shortness of breath, atrial fibrillation EXAM: CHEST  2 VIEW COMPARISON:  10/22/2016 FINDINGS: Stable calcified left midlung granuloma peripherally. Borderline cardiac enlargement without edema or CHF. No focal pneumonia, collapse or consolidation. No large effusion or pneumothorax. Trachea is midline. Degenerative changes of the spine. IMPRESSION: Stable chest exam without acute process. Remote granulomatous disease. Electronically Signed   By: Jerilynn Mages.  Shick M.D.   On: 11/12/2016 10:28    Procedures Procedures (including critical care time)  Medications Ordered in ED Medications  furosemide (LASIX) injection 20 mg (not administered)  metoprolol tartrate (LOPRESSOR) tablet 25 mg (not administered)     Initial Impression / Assessment and Plan / ED Course  I have reviewed the triage vital signs and the nursing notes.  Pertinent labs & imaging results that were available during my care of the patient were reviewed by me and considered in my medical decision making (see chart for details).     Pt comes in with cc of exertional dyspnea and leg swelling. He has hx of AF and last echo from 2015 showed LVH with preserved EF. Pt is noted to be in AF with tachycardia (100-120). He is not in resp distress or hypoxic, and the lungs dont sound severely overloaded. [name] DIb could be due to his AF - I.e. He might be  having symptomatic AF. Alternatively, it is possible that he has  Worsening cardiomyopathy or the dib is angina equivalent, although I thinks stable anginal is less likely than CHF. We also considered PE in the ddx. Pt is on coumadin, so the likelyhood is lower, but with recent surgeries and worsening AF - if pt doesn't get better, or the echo is reassuring, PE workup might need to be considered.  Final Clinical Impressions(s) / ED Diagnoses   Final diagnoses:  None    New Prescriptions New Prescriptions   No medications on file     Varney Biles, MD 11/12/16 1056

## 2016-11-12 NOTE — Consult Note (Signed)
Primary cardiologist: Dr Brett [name] Consulting cardiologist: Dr Brett [name] Requesting physician: Dr Brett [name] Indication: afib with RVR, SOB  Clinical Summary Brett [name] is a 83 y.o.male history of CAD with PCI of large 1st diagonal on 08/25/2007 with balloon angioplasty (no stent placed). Had 30-40% mid RCA disease and 20-30% mid LAD disease. LCx with only luminal irregularities, performed by Dr. Chad [name] in [place], [place]. Also history of chronic diastolic HF,permanent afib.He has recently been off coumadin for ERCP and sphincertomy for recent admission with symptomatic gallstones. Presents with increasing SOB. He reports significant progressing [name] edema over the same time as well as orthopnea.   Admission earlier in the month with afib with RVR. AV nodal agents were titrated, discharged on Lopressor 75mg  bid and diltiazem 360mg  daily. Medical therapy was complicated by frequent pauses and soft bp's. Discharge plan per Dr Brett [name] was for EP evaluation for possible ablation vs ppm. He has an outpatient appt with Dr Brett [name] 12/03/16.  WBC 6.3 Hgb 12.7 Plt 189 K 4.3 Cr 1.38  INR 2.7 BNP 572 Trop neg CXR no acute process  No Known Allergies  Medications Scheduled Medications:    Infusions:    PRN Medications:     Past Medical History:  Diagnosis Date  . Atrial fibrillation ([place])    On coumadin  . Chronic kidney disease (CKD), stage III (moderate)   . Dysrhythmia    AFib  . Essential hypertension, benign   . History of cardiac catheterization    Details not certain - reportedly no interventions  . Hypothyroidism   . Macular degeneration   . Pulmonary hypertension ([place]) 12/03/2013   PA pressure 56. Ejection fraction 55-60%  . Type 2 diabetes mellitus ([place])     Past Surgical History:  Procedure Laterality Date  . CARDIOVERSION N/A 12/10/2013   Procedure: CARDIOVERSION;  Surgeon: Brett Commons, MD;  Location: [facility];  Service: Endoscopy;   Laterality: N/A;  . CATARACT EXTRACTION W/PHACO Left 11/13/2015   Procedure: CATARACT EXTRACTION PHACO AND INTRAOCULAR LENS PLACEMENT LEFT EYE CDE=7.76;  Surgeon: Brett Che, MD;  Location: [facility];  Service: Ophthalmology;  Laterality: Left;  . CHOLECYSTECTOMY    . COLONOSCOPY     Remote > 10 years ago  . ERCP N/A 10/17/2016   Procedure: ENDOSCOPIC RETROGRADE CHOLANGIOPANCREATOGRAPHY (ERCP);  Surgeon: Brett Dolin, MD;  Location: [facility];  Service: Endoscopy;  Laterality: N/A;  . REMOVAL OF STONES  10/17/2016   Procedure: REMOVAL OF STONES with balloon;  Surgeon: Brett Dolin, MD;  Location: [facility];  Service: Endoscopy;;  . REPLACEMENT TOTAL HIP W/  RESURFACING IMPLANTS Bilateral   . SPHINCTEROTOMY  10/17/2016   Procedure: BILLARY SPHINCTEROTOMY with ballon dilation;  Surgeon: Brett Dolin, MD;  Location: [facility];  Service: Endoscopy;;  . TEE WITHOUT CARDIOVERSION N/A 12/10/2013   Procedure: TRANSESOPHAGEAL ECHOCARDIOGRAM (TEE);  Surgeon: Brett Commons, MD;  Location: [facility];  Service: Endoscopy;  Laterality: N/A;    Family History  Problem Relation Age of Onset  . Hypertension Mother   . Hypertension Father   . Colon cancer Neg Hx     Social History Brett [name] reports that he quit smoking about 38 years ago. His smoking use included Cigarettes. He started smoking about 64 years ago. He has a 37.00 pack-year smoking history. He has never used smokeless tobacco. Brett [name] reports that he drinks alcohol.  Review of Systems CONSTITUTIONAL: No weight loss, fever, chills, weakness or fatigue.  HEENT: Eyes: No visual loss, blurred vision, double vision or yellow sclerae. No hearing loss, sneezing, congestion, runny nose or sore throat.  SKIN: No rash or itching.  CARDIOVASCULAR: per HPI RESPIRATORY: per HPI  GASTROINTESTINAL: No anorexia, nausea, vomiting or diarrhea. No abdominal pain or blood.  GENITOURINARY: no polyuria, no dysuria NEUROLOGICAL: No  headache, dizziness, syncope, paralysis, ataxia, numbness or tingling in the extremities. No change in bowel or bladder control.  MUSCULOSKELETAL: No muscle, back pain, joint pain or stiffness.  HEMATOLOGIC: No anemia, bleeding or bruising.  LYMPHATICS: No enlarged nodes. No history of splenectomy.  PSYCHIATRIC: No history of depression or anxiety.      Physical Examination Blood pressure 129/80, pulse (!) 131, temperature 97.9 F (36.6 C), temperature source Oral, resp. rate 20, SpO2 91 %.  Intake/Output Summary (Last 24 hours) at 11/12/16 1259 Last data filed at 11/12/16 1258  Gross per 24 hour  Intake                0 ml  Output              850 ml  Net             -850 ml    HEENT: sclera clear, throat clear  Cardiovascular: irreg, no m/r/g, JVD to angle of jaw  Respiratory: mild crackles bilateral bases  GI: abdomen soft, NT, ND  MSK: 2+ bilateral [name] edema  Neuro: no focal deficits  Psych: appropriate affect   Lab Results  Basic Metabolic Panel:  Recent Labs Lab 11/12/16 0953  NA 136  K 4.3  CL 102  CO2 24  GLUCOSE 136*  BUN 16  CREATININE 1.38*  CALCIUM 9.0    Liver Function Tests: No results for input(s): AST, ALT, ALKPHOS, BILITOT, PROT, ALBUMIN in the last 168 hours.  CBC:   Cardiac Enzymes:  Recent Labs Lab 11/12/16 0953  TROPONINI <0.03    BNP: Invalid input(s): POCBNP   ECG   Imaging   Impression/Recommendations 1. Acute on chronic diastolc HF - echo 2015 LVEF 60-65%, cannot eval diastolic function, moderate AI - CHF likely exacerbated by ongoing tachcyardia related to afib.  - repeat echo once heart rates better controlled, concern for possible tachycardia induced CM - continue IV lasix 40mg  IV bid.    2. Aortic regurgitation - moderate by echo in 2015, repeat study.   3. Afib/ Tachy-brady syndrome - rate control has historically been complicated by pauses 1-6.1 seconds - from chart review had been on amio  transiently in 2015, maintained SR. Amio stopped due to concerns about long term side effect risks - with his CAD history limited antiarrhythmic options. Baseline QTc likely too long for dofetilide or sotalol.  - based on limited treatment options, I believe a repeat trial of amiodarone is reasonable. We will start IV load overnight, please hold his diltiazem,can continue lopressor at this time at lower dose of 50mg  bid given his propensity for pauses concern would be significant bradycardia on amio.Will need admit to stepdown unit.  - continue coumadin for stroke prevention, will need close INR monitoring since starting amiodarone  4. CAD - no acute issues.    Brett [name], M.D.

## 2016-11-12 NOTE — ED Notes (Signed)
Pt noted to have distended abdomen and swelling to ankle and feet. Reports increase SOB with exertion

## 2016-11-12 NOTE — Plan of Care (Signed)
Problem: Education: Goal: Knowledge of [facility] Education information/materials will improve Outcome: Progressing Pt recently discharged. Verbalized understanding of unit routines. Pt education booklet provided.

## 2016-11-12 NOTE — ED Notes (Signed)
Dr Liane Comber notifed of elevated HR

## 2016-11-12 NOTE — ED Triage Notes (Signed)
Pt states d/c as inpt last week for irregular hr and sob. States started having ble prior to dc but is now much worse since discharged with increased sob. ble swelling noted. abd distended and mildly firm. A/o

## 2016-11-12 NOTE — Addendum Note (Signed)
Addended by: Debbora Lacrosse R on: 11/12/2016 09:31 AM   Modules accepted: Orders

## 2016-11-12 NOTE — Progress Notes (Signed)
ANTICOAGULATION CONSULT NOTE - Initial Consult  Pharmacy Consult for coumadin Indication: atrial fibrillation  No Known Allergies  Patient Measurements: Height: 6' (182.9 cm) Weight: 215 lb 13.3 oz (97.9 kg) IBW/kg (Calculated) : 77.6  Vital Signs: Temp: 98.3 F (36.8 C) (07/31 2052) Temp Source: Oral (07/31 2052) BP: 125/97 (07/31 2000) Pulse Rate: 98 (07/31 2000)  Labs:  Recent Labs  11/12/16 0953  HGB 12.7*  HCT 38.6*  PLT 189  LABPROT 29.3*  INR 2.71  CREATININE 1.38*  TROPONINI <0.03    Estimated Creatinine Clearance: 50 mL/min (A) (by C-G formula based on SCr of 1.38 mg/dL (H)).   Medical History: Past Medical History:  Diagnosis Date  . Atrial fibrillation ([place])    On coumadin  . Chronic kidney disease (CKD), stage III (moderate)   . Dysrhythmia    AFib  . Essential hypertension, benign   . History of cardiac catheterization    Details not certain - reportedly no interventions  . Hypothyroidism   . Macular degeneration   . Pulmonary hypertension ([place]) 12/03/2013   PA pressure 56. Ejection fraction 55-60%  . Type 2 diabetes mellitus ([facility])     Medications:  Prescriptions Prior to Admission  Medication Sig Dispense Refill Last Dose  . diltiazem (CARDIZEM CD) 360 MG 24 hr capsule Take 1 capsule (360 mg total) by mouth daily. 30 capsule 0 11/12/2016 at Unknown time  . doxazosin (CARDURA) 2 MG tablet Take 1 tablet (2 mg total) by mouth at bedtime. 90 tablet 3 11/11/2016 at Unknown time  . furosemide (LASIX) 40 MG tablet Take 1 tablet (40 mg total) by mouth daily. 90 tablet 3   . levothyroxine (SYNTHROID, LEVOTHROID) 50 MCG tablet Take 50 mcg by mouth daily.   11/12/2016 at Unknown time  . metFORMIN (GLUCOPHAGE) 500 MG tablet Take 500 mg by mouth 2 (two) times daily with a meal.    11/12/2016 at Unknown time  . Metoprolol Tartrate 75 MG TABS Take 75 mg by mouth 2 (two) times daily. 60 tablet 0 11/12/2016 at 630  . warfarin (COUMADIN) 3 MG tablet Take 1 tablet  (3 mg total) by mouth daily at 6 PM. 30 tablet 0 11/11/2016 at 2300  . docusate sodium (COLACE) 100 MG capsule Take 100 mg by mouth 2 (two) times daily.   Past Week at Unknown time    Assessment: 83 yo man to continue coumadin for afib.  Admission INR is therapeutic Goal of Therapy:  INR 2-3 Monitor platelets by anticoagulation protocol: Yes   Plan:  No coumadin tonight Daily PT/INR  Monitor for bleeding complications  Excell Seltzer Poteet 11/12/2016,9:01 PM

## 2016-11-12 NOTE — ED Notes (Signed)
Spoke with ICU nurse states room is still not clean and they will call us when bed is ready

## 2016-11-12 NOTE — Plan of Care (Signed)
Problem: Safety: Goal: Ability to remain free from injury will improve Outcome: Progressing Bed in low position, side rails up, call bell and personal items with in reach   

## 2016-11-12 NOTE — Telephone Encounter (Signed)
Can take Lasix 40 mg daily. When he was discharged from the hospital he was supposed to have seen EP one week later. Please make sure this happens asap.

## 2016-11-12 NOTE — H&P (Signed)
History and Physical    Brett [name] WNI:[id number] DOB: [birth year] DOA: 11/12/2016  PCP: Patient, No Pcp Per  Patient coming from: Home.   Chief Complaint:  SOB>   HPI: Brett [name] is an 83 y.o. male with hx of afib on coumadin, CKD, DM on Metformin, hypothyroidism, brought to the ER as he was SOB.  He was found to have afib with RVR, and CXR was clear and serology was unremarkable.  He was seen in consultation with cardiology, and Dr Harl Bowie felt that his SOB was likely due to tachyarrythmia.  He recommended using Amiodarone once more, as his QTc limited the use of other antiarrythmic drug.     ED Course:  See above.  Rewiew of Systems:  Constitutional: Negative for malaise, fever and chills. No significant weight loss or weight gain Eyes: Negative for eye pain, redness and discharge, diplopia, visual changes, or flashes of light. ENMT: Negative for ear pain, hoarseness, nasal congestion, sinus pressure and sore throat. No headaches; tinnitus, drooling, or problem swallowing. Cardiovascular: Negative for chest pain, palpitations, diaphoresis, and peripheral edema. ; No orthopnea, PND Respiratory: Negative for cough, hemoptysis, wheezing and stridor. No pleuritic chestpain. Gastrointestinal: Negative for diarrhea, constipation,  melena, blood in stool, hematemesis, jaundice and rectal bleeding.    Genitourinary: Negative for frequency, dysuria, incontinence,flank pain and hematuria; Musculoskeletal: Negative for back pain and neck pain. Negative for swelling and trauma.;  Skin: . Negative for pruritus, rash, abrasions, bruising and skin lesion.; ulcerations Neuro: Negative for headache, lightheadedness and neck stiffness. Negative for weakness, altered level of consciousness , altered mental status, extremity weakness, burning feet, involuntary movement, seizure and syncope.  Psych: negative for anxiety, depression, insomnia, tearfulness, panic attacks, hallucinations, paranoia,  suicidal or homicidal ideation    Past Medical History:  Diagnosis Date  . Atrial fibrillation ([place])    On coumadin  . Chronic kidney disease (CKD), stage III (moderate)   . Dysrhythmia    AFib  . Essential hypertension, benign   . History of cardiac catheterization    Details not certain - reportedly no interventions  . Hypothyroidism   . Macular degeneration   . Pulmonary hypertension ([place]) 12/03/2013   PA pressure 56. Ejection fraction 55-60%  . Type 2 diabetes mellitus ([place])    Past Surgical History:  Procedure Laterality Date  . CARDIOVERSION N/A 12/10/2013   Procedure: CARDIOVERSION;  Surgeon: Herminio Commons, MD;  Location: [facility];  Service: Endoscopy;  Laterality: N/A;  . CATARACT EXTRACTION W/PHACO Left 11/13/2015   Procedure: CATARACT EXTRACTION PHACO AND INTRAOCULAR LENS PLACEMENT LEFT EYE CDE=7.76;  Surgeon: Williams Che, MD;  Location: [facility];  Service: Ophthalmology;  Laterality: Left;  . CHOLECYSTECTOMY    . COLONOSCOPY     Remote > 10 years ago  . ERCP N/A 10/17/2016   Procedure: ENDOSCOPIC RETROGRADE CHOLANGIOPANCREATOGRAPHY (ERCP);  Surgeon: Daneil Dolin, MD;  Location[facility];  Service: Endoscopy;  Laterality: N/A;  . REMOVAL OF STONES  10/17/2016   Procedure: REMOVAL OF STONES with balloon;  Surgeon: Daneil Dolin, MD;  [facility];  Service: Endoscopy;;  . REPLACEMENT TOTAL HIP W/  RESURFACING IMPLANTS Bilateral   . SPHINCTEROTOMY  10/17/2016   Procedure: BILLARY SPHINCTEROTOMY with ballon dilation;  Surgeon: Daneil Dolin, MD;  [facility];  Service: Endoscopy;;  . TEE WITHOUT CARDIOVERSION N/A 12/10/2013   Procedure: TRANSESOPHAGEAL ECHOCARDIOGRAM (TEE);  Surgeon: Herminio Commons, MD;  Location: [facility];  Service: Endoscopy;  Laterality: N/A;     reports that he quit smoking about 38 years ago. His smoking use included Cigarettes. He started smoking about 64 years ago. He has a 37.00 pack-year smoking history. He has  never used smokeless tobacco. He reports that he drinks alcohol. He reports that he does not use drugs.  No Known Allergies  Family History  Problem Relation Age of Onset  . Hypertension Mother   . Hypertension Father   . Colon cancer Neg Hx      Prior to Admission medications   Medication Sig Start Date End Date Taking? Authorizing Provider  diltiazem (CARDIZEM CD) 360 MG 24 hr capsule Take 1 capsule (360 mg total) by mouth daily. 11/01/16  Yes Arrien, Jimmy Picket, MD  doxazosin (CARDURA) 2 MG tablet Take 1 tablet (2 mg total) by mouth at bedtime. 04/24/15  Yes Herminio Commons, MD  furosemide (LASIX) 40 MG tablet Take 1 tablet (40 mg total) by mouth daily. 11/12/16 02/10/17 Yes Herminio Commons, MD  levothyroxine (SYNTHROID, LEVOTHROID) 50 MCG tablet Take 50 mcg by mouth daily.   Yes [provider]  metFORMIN (GLUCOPHAGE) 500 MG tablet Take 500 mg by mouth 2 (two) times daily with a meal.    Yes [provider]  Metoprolol Tartrate 75 MG TABS Take 75 mg by mouth 2 (two) times daily. 11/04/16 12/04/16 Yes Herminio Commons, MD  warfarin (COUMADIN) 3 MG tablet Take 1 tablet (3 mg total) by mouth daily at 6 PM. 10/20/16  Yes Tat, Shanon Brow, MD  docusate sodium (COLACE) 100 MG capsule Take 100 mg by mouth 2 (two) times daily.    [provider]    Physical Exam: Vitals:   11/12/16 1500 11/12/16 1503 11/12/16 1530 11/12/16 1600  BP: (!) 140/93 (!) 133/121 (!) 125/92   Pulse: (!) 111 (!) 107 (!) 131 (!) 110  Resp: 18 20 (!) 21 (!) 21  Temp:      TempSrc:      SpO2: 95% 96% 96% 96%    Constitutional: NAD, calm, comfortable Vitals:   11/12/16 1500 11/12/16 1503 11/12/16 1530 11/12/16 1600  BP: (!) 140/93 (!) 133/121 (!) 125/92   Pulse: (!) 111 (!) 107 (!) 131 (!) 110  Resp: 18 20 (!) 21 (!) 21  Temp:      TempSrc:      SpO2: 95% 96% 96% 96%   Eyes: PERRL, lids and conjunctivae normal ENMT: Mucous membranes are moist. Posterior pharynx clear of  any exudate or lesions.Normal dentition.  Neck: normal, supple, no masses, no thyromegaly Respiratory: clear to auscultation bilaterally, no wheezing, no crackles. Normal respiratory effort. No accessory muscle use.  Cardiovascular: Regular rate and rhythm, no murmurs / rubs / gallops. No extremity edema. 2+ pedal pulses. No carotid bruits.  Abdomen: no tenderness, no masses palpated. No hepatosplenomegaly. Bowel sounds positive.  Musculoskeletal: no clubbing / cyanosis. No joint deformity upper and lower extremities. Good ROM, no contractures. Normal muscle tone.  Skin: no rashes, lesions, ulcers. No induration Neurologic: CN 2-12 grossly intact. Sensation intact, DTR normal. Strength 5/5 in all 4.  Psychiatric: Normal judgment and insight. Alert and oriented x 3. Normal mood.    Labs on Admission: I have personally reviewed following labs and imaging studies CBC:  Recent Labs Lab 11/12/16 0953  WBC 6.3  NEUTROABS 4.2  HGB 12.7*  HCT 38.6*  MCV 92.8  PLT 875   Basic Metabolic Panel:  Recent Labs Lab 11/12/16 0953  NA 136  K 4.3  CL 102  CO2 24  GLUCOSE 136*  BUN 16  CREATININE 1.38*  CALCIUM 9.0   GFR: CrCl cannot be calculated (Unknown ideal weight.). Liver Function Tests:  Recent Labs Lab 11/12/16 1505  AST 22  ALT 27  ALKPHOS 88  BILITOT 1.5*  PROT 6.8  ALBUMIN 3.9   Coagulation Profile:  Recent Labs Lab 11/12/16 0953  INR 2.71   Cardiac Enzymes:  Recent Labs Lab 11/12/16 0953  TROPONINI <0.03   Urine analysis:    Component Value Date/Time   COLORURINE AMBER (A) 10/15/2016 0320   APPEARANCEUR CLEAR 10/15/2016 0320   LABSPEC >1.046 (H) 10/15/2016 0320   PHURINE 5.0 10/15/2016 0320   GLUCOSEU 50 (A) 10/15/2016 0320   HGBUR MODERATE (A) 10/15/2016 0320   BILIRUBINUR NEGATIVE 10/15/2016 0320   KETONESUR 5 (A) 10/15/2016 0320   PROTEINUR NEGATIVE 10/15/2016 0320   UROBILINOGEN 0.2 12/01/2013 0740   NITRITE NEGATIVE 10/15/2016 0320    LEUKOCYTESUR NEGATIVE 10/15/2016 0320    Radiological Exams on Admission: Dg Chest 2 View  Result Date: 11/12/2016 CLINICAL DATA:  Shortness of breath, atrial fibrillation EXAM: CHEST  2 VIEW COMPARISON:  10/22/2016 FINDINGS: Stable calcified left midlung granuloma peripherally. Borderline cardiac enlargement without edema or CHF. No focal pneumonia, collapse or consolidation. No large effusion or pneumothorax. Trachea is midline. Degenerative changes of the spine. IMPRESSION: Stable chest exam without acute process. Remote granulomatous disease. Electronically Signed   By: Jerilynn Mages.  Shick M.D.   On: 11/12/2016 10:28    EKG: Independently reviewed.   Assessment/Plan Principal Problem:   Atrial fibrillation with RVR ([facility]) Active Problems:   Type 2 diabetes mellitus with diabetic nephropathy (HCC)   Hypertension   Hypothyroidism   Pulmonary hypertension (HCC)   Chronic diastolic heart failure (HCC)   HLD (hyperlipidemia)   Lumbar back sprain   Acute on chronic diastolic CHF (congestive heart failure) (HCC)    PLAN:   Afib with RVR:  Cardiology was consulted, and has restarted Amiodarone with IV bolus and drip.  Admit to SDU.  D/C Cardiazem, and continue with lower BB dose per Dr Harl Bowie.  Continue with coumadin per pharmacy dosing.   Hypothryodism:  Continue with supplement.  Will check TSH.  Acute on chronic diastolic CHF:  Will diurese.  Follow Cr.   DM:  Will cover SSI as required.   DVT prophylaxis: Coumadin . Code Status: FULL CODE.  Family Communication: None at bedside.  Disposition Plan: Home.  Consults called: Cardiology.  Admission status: inpatient.    Glenola Wheat MD FACP. [facility]  If 7PM-7AM, please contact night-coverage [email or web address] Password [facility]  11/12/2016, 4:26 PM

## 2016-11-13 DIAGNOSIS — I509 Heart failure, unspecified: Secondary | ICD-10-CM

## 2016-11-13 LAB — COMPREHENSIVE METABOLIC PANEL
ALT: 22 U/L (ref 17–63)
AST: 16 U/L (ref 15–41)
Albumin: 3.7 g/dL (ref 3.5–5.0)
Alkaline Phosphatase: 82 U/L (ref 38–126)
Anion gap: 6 (ref 5–15)
BILIRUBIN TOTAL: 0.9 mg/dL (ref 0.3–1.2)
BUN: 15 mg/dL (ref 6–20)
CO2: 29 mmol/L (ref 22–32)
Calcium: 8.8 mg/dL — ABNORMAL LOW (ref 8.9–10.3)
Chloride: 103 mmol/L (ref 101–111)
Creatinine, Ser: 1.34 mg/dL — ABNORMAL HIGH (ref 0.61–1.24)
GFR calc Af Amer: 55 mL/min — ABNORMAL LOW (ref >60)
GFR, EST NON AFRICAN AMERICAN: 48 mL/min — AB (ref >60)
GLUCOSE: 134 mg/dL — AB (ref 65–99)
Potassium: 4.5 mmol/L (ref 3.5–5.1)
Sodium: 138 mmol/L (ref 135–145)
Total Protein: 6.3 g/dL — ABNORMAL LOW (ref 6.5–8.1)

## 2016-11-13 LAB — CBC
HCT: 36.5 % — ABNORMAL LOW (ref 39.0–52.0)
Hemoglobin: 12 g/dL — ABNORMAL LOW (ref 13.0–17.0)
MCH: 30.7 pg (ref 26.0–34.0)
MCHC: 32.9 g/dL (ref 30.0–36.0)
MCV: 93.4 fL (ref 78.0–100.0)
PLATELETS: 158 10*3/uL (ref 150–400)
RBC: 3.91 MIL/uL — AB (ref 4.22–5.81)
RDW: 14 % (ref 11.5–15.5)
WBC: 5.2 10*3/uL (ref 4.0–10.5)

## 2016-11-13 LAB — GLUCOSE, CAPILLARY
GLUCOSE-CAPILLARY: 113 mg/dL — AB (ref 65–99)
Glucose-Capillary: 125 mg/dL — ABNORMAL HIGH (ref 65–99)
Glucose-Capillary: 106 mg/dL — ABNORMAL HIGH (ref 65–99)

## 2016-11-13 LAB — PROTIME-INR
INR: 3.08
PROTHROMBIN TIME: 32.5 s — AB (ref 11.4–15.2)

## 2016-11-13 MED ORDER — FUROSEMIDE 10 MG/ML IJ SOLN
20.0000 mg | Freq: Once | INTRAMUSCULAR | Status: AC
  Administered 2016-11-13: 20 mg via INTRAVENOUS
  Filled 2016-11-13: qty 2

## 2016-11-13 NOTE — Progress Notes (Signed)
Progress Note  Patient Name: Brett [name] Date of Encounter: 11/13/2016   Subjective   No complaints  Inpatient Medications    Scheduled Meds: . docusate sodium  100 mg Oral BID  . doxazosin  2 mg Oral QHS  . levothyroxine  50 mcg Oral QAC breakfast  . metFORMIN  500 mg Oral BID WC  . metoprolol tartrate  50 mg Oral BID   Continuous Infusions: . amiodarone 30 mg/hr (11/13/16 0621)   PRN Meds: zolpidem   Vital Signs    Vitals:   11/13/16 0200 11/13/16 0300 11/13/16 0400 11/13/16 0500  BP: (!) 126/99 118/90  (!) 138/99  Pulse: (!) 110 (!) 107 (!) 25 (!) 121  Resp: 18 (!) 22 17 (!) 21  Temp:   98.2 F (36.8 C)   TempSrc:   Oral   SpO2: 93% 93% 91% 96%  Weight:   215 lb 13.3 oz (97.9 kg)   Height:        Intake/Output Summary (Last 24 hours) at 11/13/16 0850 Last data filed at 11/13/16 2952  Gross per 24 hour  Intake           311.01 ml  Output             2150 ml  Net         -1838.99 ml   Filed Weights   11/12/16 2052 11/13/16 0400  Weight: 215 lb 13.3 oz (97.9 kg) 215 lb 13.3 oz (97.9 kg)    Telemetry    afib rates 110s-120s  ECG      Physical Exam   GEN: No acute distress.   Neck: No JVD Cardiac: irreg, no mr/g, no jvd  Respiratory: Clear to auscultation bilaterally. GI: Soft, nontender, non-distended  MS: No edema; No deformity. Neuro:  Nonfocal  Psych: Normal affect   Labs    Chemistry Recent Labs Lab 11/12/16 0953 11/12/16 1505 11/13/16 0435  NA 136  --  138  K 4.3  --  4.5  CL 102  --  103  CO2 24  --  29  GLUCOSE 136*  --  134*  BUN 16  --  15  CREATININE 1.38*  --  1.34*  CALCIUM 9.0  --  8.8*  PROT  --  6.8 6.3*  ALBUMIN  --  3.9 3.7  AST  --  22 16  ALT  --  27 22  ALKPHOS  --  88 82  BILITOT  --  1.5* 0.9  GFRNONAA 46*  --  48*  GFRAA 53*  --  55*  ANIONGAP 10  --  6     Hematology Recent Labs Lab 11/12/16 0953 11/13/16 0435  WBC 6.3 5.2  RBC 4.16* 3.91*  HGB 12.7* 12.0*  HCT 38.6* 36.5*  MCV 92.8  93.4  MCH 30.5 30.7  MCHC 32.9 32.9  RDW 14.1 14.0  PLT 189 158    Cardiac Enzymes Recent Labs Lab 11/12/16 0953  TROPONINI <0.03   No results for input(s): TROPIPOC in the last 168 hours.   BNP Recent Labs Lab 11/12/16 0958  BNP 572.0*     DDimer No results for input(s): DDIMER in the last 168 hours.   Radiology    Dg Chest 2 View  Result Date: 11/12/2016 CLINICAL DATA:  Shortness of breath, atrial fibrillation EXAM: CHEST  2 VIEW COMPARISON:  10/22/2016 FINDINGS: Stable calcified left midlung granuloma peripherally. Borderline cardiac enlargement without edema or CHF. No focal pneumonia, collapse or consolidation.  No large effusion or pneumothorax. Trachea is midline. Degenerative changes of the spine. IMPRESSION: Stable chest exam without acute process. Remote granulomatous disease. Electronically Signed   By: Jerilynn Mages.  Shick M.D.   On: 11/12/2016 10:28    Cardiac Studies    Patient Profile     Mr. Brett [name] is a 83 y.o.male history of CAD with PCI of large 1st diagonal on 08/25/2007 with balloon angioplasty (no stent placed). Had 30-40% mid RCA disease and 20-30% mid LAD disease. LCx with only luminal irregularities, performed by Dr. Chad Cordial in [place], [place]. Also history of chronic diastolic HF,permanent afib.He has recently been off coumadin for ERCP and sphincertomy for recent admission with symptomatic gallstones. Presents with increasing SOB. He reports significant progressing LE edema over the same time as well as orthopnea.   Assessment & Plan    1. Acute on chronic diastolic HF - likely exacerbated by afib with elevated rates - he is negative 1.8 liters since admission. Overall stable renal function with diuresis, he received IV lasix 20mg  x 1 yesterday.  - repeat echo once rates better controlled - repeat IV lasix today, did well with 20mg  IV x 1 alone yesterday, will repeat   2. Afib with RVR/Tachy-brady syndrome - difficult to control rates due to  intermittent pauses - started on amiodarone yesterday. With his CAD and baseline long QT limited antiarrhythmic options. - he is on coumadin for anticoag - has outpt EP appt for consideration next month - dilt held while on amion, lopressor lowered to 50mg  bid - continue amio load, may consider cardioversion this admit. INR therapeutic since 10/25/16, Friday would mark 3 weeks. Follow rates into afternoon, may rebolus additional 150mg  later today.    Merrily Pew, MD  11/13/2016, 8:50 AM

## 2016-11-13 NOTE — Plan of Care (Signed)
Problem: Physical Regulation: Goal: Ability to maintain clinical measurements within normal limits will improve Outcome: Progressing Patient able to move around in bed and gets up with no issues.

## 2016-11-13 NOTE — Telephone Encounter (Signed)
Called pt, no answer. Left a detailed message on pt's private voicemail.

## 2016-11-13 NOTE — Progress Notes (Signed)
PROGRESS NOTE    Brett [name]  KWI:[id number] DOB: 12/03/1932 DOA: 11/12/2016 PCP: Brett [name]    Brief Narrative:  Brett [name] is an 83 y.o. male with hx of permanent afib on coumadin, CKD, DM on Metformin, hypothyroidism, brought to the ER as he was SOB.  He was found to have afib with RVR, and CXR was clear and serology was unremarkable.  He was seen in consultation with cardiology, and Dr Harl Bowie felt that his SOB was likely due to tachyarrythmia.  He recommended using Amiodarone once more, as his QTc limited the use of other antiarrythmic drug.     Assessment & Plan:   Principal Problem:   Atrial fibrillation with RVR (HCC) Active Problems:   Type 2 diabetes mellitus with diabetic nephropathy (HCC)   Hypertension   Hypothyroidism   Pulmonary hypertension (HCC)   Chronic diastolic heart failure (HCC)   HLD (hyperlipidemia)   Lumbar back sprain   Acute on chronic diastolic CHF (congestive heart failure) ([place])  Permanent Afib with RVR:  Cardiology was consulted, and has restarted Amiodarone with IV bolus and drip.  Admit to SDU.  D/C Cardiazem, and continue with lower BB dose [name] Dr Harl Bowie.  Continue with coumadin [name] pharmacy dosing.  Rate is a little better.  Suspect it will be controlled with Amiodarone as it had in the past.   Hypothryodism:  Continue with supplement.  Will check TSH.  Acute on chronic diastolic CHF:  Will diurese.  Follow Cr.   DM:  Will cover SSI as required.   DVT prophylaxis: Coumadin . Code Status: FULL CODE.  Family Communication: None at bedside.  Disposition Plan: Home.  Consults called: Cardiology.  Admission status: inpatient.    Antimicrobials: Anti-infectives    None       Subjective: feeling well.  No complaints.  Objective: Vitals:   11/13/16 0200 11/13/16 0300 11/13/16 0400 11/13/16 0500  BP: (!) 126/99 118/90  (!) 138/99  Pulse: (!) 110 (!) 107 (!) 25 (!) 121  Resp: 18 (!) 22 17 (!) 21  Temp:   98.2 F  (36.8 C)   TempSrc:   Oral   SpO2: 93% 93% 91% 96%  Weight:   97.9 kg (215 lb 13.3 oz)   Height:        Intake/Output Summary (Last 24 hours) at 11/13/16 0830 Last data filed at 11/13/16 2426  Gross [name] 24 hour  Intake           311.01 ml  Output             2150 ml  Net         -1838.99 ml   Filed Weights   11/12/16 2052 11/13/16 0400  Weight: 97.9 kg (215 lb 13.3 oz) 97.9 kg (215 lb 13.3 oz)    Examination:  General exam: Appears calm and comfortable  Respiratory system: Clear to auscultation. Respiratory effort normal. Cardiovascular system: S1 & S2 heard, RRR. No JVD, murmurs, rubs, gallops or clicks. No pedal edema. Gastrointestinal system: Abdomen is nondistended, soft and nontender. No organomegaly or masses felt. Normal bowel sounds heard. Central nervous system: Alert and oriented. No focal neurological deficits. Extremities: Symmetric 5 x 5 power. Skin: No rashes, lesions or ulcers Psychiatry: Judgement and insight appear normal. Mood & affect appropriate.   Data Reviewed: I have personally reviewed following labs and imaging studies  CBC:  Recent Labs Lab 11/12/16 0953 11/13/16 0435  WBC 6.3 5.2  NEUTROABS 4.2  --  HGB 12.7* 12.0*  HCT 38.6* 36.5*  MCV 92.8 93.4  PLT 189 702   Basic Metabolic Panel:  Recent Labs Lab 11/12/16 0953 11/13/16 0435  NA 136 138  K 4.3 4.5  CL 102 103  CO2 24 29  GLUCOSE 136* 134*  BUN 16 15  CREATININE 1.38* 1.34*  CALCIUM 9.0 8.8*   GFR: Estimated Creatinine Clearance: 51.5 mL/min (A) (by C-G formula based on SCr of 1.34 mg/dL (H)). Liver Function Tests:  Recent Labs Lab 11/12/16 1505 11/13/16 0435  AST 22 16  ALT 27 22  ALKPHOS 88 82  BILITOT 1.5* 0.9  PROT 6.8 6.3*  ALBUMIN 3.9 3.7   Coagulation Profile:  Recent Labs Lab 11/12/16 0953 11/13/16 0435  INR 2.71 3.08   Cardiac Enzymes:  Recent Labs Lab 11/12/16 0953  TROPONINI <0.03   CBG:  Recent Labs Lab 11/12/16 2117 11/13/16 0757   GLUCAP 140* 113*   Thyroid Function Tests:  Recent Labs  11/12/16 1006  TSH 2.228   Anemia Panel: Radiology Studies: Dg Chest 2 View  Result Date: 11/12/2016 CLINICAL DATA:  Shortness of breath, atrial fibrillation EXAM: CHEST  2 VIEW COMPARISON:  10/22/2016 FINDINGS: Stable calcified left midlung granuloma peripherally. Borderline cardiac enlargement without edema or CHF. No focal pneumonia, collapse or consolidation. No large effusion or pneumothorax. Trachea is midline. Degenerative changes of the spine. IMPRESSION: Stable chest exam without acute process. Remote granulomatous disease. Electronically Signed   By: Jerilynn Mages.  Shick M.D.   On: 11/12/2016 10:28    Scheduled Meds: . docusate sodium  100 mg Oral BID  . doxazosin  2 mg Oral QHS  . levothyroxine  50 mcg Oral QAC breakfast  . metFORMIN  500 mg Oral BID WC  . metoprolol tartrate  50 mg Oral BID   Continuous Infusions: . amiodarone 30 mg/hr (11/13/16 0621)     LOS: 1 day   Brett Greek, MD FACP Hospitalist.   If 7PM-7AM, please contact night-coverage [email or web address] Password [email or web address] 11/13/2016, 8:30 AM

## 2016-11-13 NOTE — Progress Notes (Signed)
[place] for coumadin Indication: atrial fibrillation  No Known Allergies  Patient Measurements: Height: 6' (182.9 cm) Weight: 215 lb 13.3 oz (97.9 kg) IBW/kg (Calculated) : 77.6  Vital Signs: Temp: 97.5 F (36.4 C) (08/01 0800) Temp Source: Oral (08/01 0400) BP: 151/109 (08/01 0900) Pulse Rate: 122 (08/01 0900)  Labs:  Recent Labs  11/12/16 0953 11/13/16 0435  HGB 12.7* 12.0*  HCT 38.6* 36.5*  PLT 189 158  LABPROT 29.3* 32.5*  INR 2.71 3.08  CREATININE 1.38* 1.34*  TROPONINI <0.03  --     Estimated Creatinine Clearance: 51.5 mL/min (A) (by C-G formula based on SCr of 1.34 mg/dL (H)).   Medical History: Past Medical History:  Diagnosis Date  . Atrial fibrillation ([place])    On coumadin  . Chronic kidney disease (CKD), stage III (moderate)   . Dysrhythmia    AFib  . Essential hypertension, benign   . History of cardiac catheterization    Details not certain - reportedly no interventions  . Hypothyroidism   . Macular degeneration   . Pulmonary hypertension ([place]) 12/03/2013   PA pressure 56. Ejection fraction 55-60%  . Type 2 diabetes mellitus (HCC)     Medications:  Prescriptions Prior to Admission  Medication Sig Dispense Refill Last Dose  . diltiazem (CARDIZEM CD) 360 MG 24 hr capsule Take 1 capsule (360 mg total) by mouth daily. 30 capsule 0 11/12/2016 at Unknown time  . doxazosin (CARDURA) 2 MG tablet Take 1 tablet (2 mg total) by mouth at bedtime. 90 tablet 3 11/11/2016 at Unknown time  . furosemide (LASIX) 40 MG tablet Take 1 tablet (40 mg total) by mouth daily. 90 tablet 3   . levothyroxine (SYNTHROID, LEVOTHROID) 50 MCG tablet Take 50 mcg by mouth daily.   11/12/2016 at Unknown time  . metFORMIN (GLUCOPHAGE) 500 MG tablet Take 500 mg by mouth 2 (two) times daily with a meal.    11/12/2016 at Unknown time  . Metoprolol Tartrate 75 MG TABS Take 75 mg by mouth 2 (two) times daily. 60 tablet 0 11/12/2016 at 630  .  warfarin (COUMADIN) 3 MG tablet Take 1 tablet (3 mg total) by mouth daily at 6 PM. 30 tablet 0 11/11/2016 at 2300  . docusate sodium (COLACE) 100 MG capsule Take 100 mg by mouth 2 (two) times daily.   Past Week at Unknown time    Assessment: 83 yo man to continue coumadin for afib.  Admission INR is therapeutic but todays INR is elevated.   Goal of Therapy:  INR 2-3 Monitor platelets by anticoagulation protocol: Yes   Plan:  No coumadin today Daily PT/INR  Monitor for bleeding complications  Isac Sarna, BS Vena [place], BCPS Clinical Pharmacist Pager [phone] 11/13/2016,11:08 AM

## 2016-11-14 ENCOUNTER — Inpatient Hospital Stay (HOSPITAL_COMMUNITY): Payer: Medicare HMO

## 2016-11-14 DIAGNOSIS — R0600 Dyspnea, unspecified: Secondary | ICD-10-CM

## 2016-11-14 LAB — ECHOCARDIOGRAM COMPLETE
Height: 72 in
Weight: 3446.23 oz

## 2016-11-14 LAB — GLUCOSE, CAPILLARY
Glucose-Capillary: 120 mg/dL — ABNORMAL HIGH (ref 65–99)
Glucose-Capillary: 151 mg/dL — ABNORMAL HIGH (ref 65–99)
Glucose-Capillary: 109 mg/dL — ABNORMAL HIGH (ref 65–99)

## 2016-11-14 LAB — PROTIME-INR
INR: 2.56
PROTHROMBIN TIME: 28 s — AB (ref 11.4–15.2)

## 2016-11-14 MED ORDER — AMIODARONE HCL 200 MG PO TABS: 400.0000 mg | ORAL_TABLET | Freq: Two times a day (BID) | ORAL | Status: DC

## 2016-11-14 MED ORDER — FUROSEMIDE 10 MG/ML IJ SOLN
20.0000 mg | Freq: Once | INTRAMUSCULAR | Status: AC
  Administered 2016-11-14: 20 mg via INTRAVENOUS
  Filled 2016-11-14: qty 2

## 2016-11-14 MED ORDER — SODIUM CHLORIDE 0.9% FLUSH
3.0000 mL | Freq: Two times a day (BID) | INTRAVENOUS | Status: DC
  Administered 2016-11-14 – 2016-11-15 (×2): 3 mL via INTRAVENOUS

## 2016-11-14 MED ORDER — AMIODARONE LOAD VIA INFUSION
150.0000 mg | Freq: Once | INTRAVENOUS | Status: AC
  Administered 2016-11-14: 150 mg via INTRAVENOUS
  Filled 2016-11-14: qty 83.34

## 2016-11-14 MED ORDER — SODIUM CHLORIDE 0.9% FLUSH: 3.0000 mL | INTRAVENOUS | Status: DC | PRN

## 2016-11-14 MED ORDER — SODIUM CHLORIDE 0.9 % IV SOLN: 250.0000 mL | INTRAVENOUS | Status: DC

## 2016-11-14 MED ORDER — WARFARIN SODIUM 2.5 MG PO TABS
2.5000 mg | ORAL_TABLET | Freq: Once | ORAL | Status: AC
Start: 2016-11-14 — End: 2016-11-14
  Administered 2016-11-14: 2.5 mg via ORAL
  Filled 2016-11-14: qty 1

## 2016-11-14 MED ORDER — WARFARIN - PHARMACIST DOSING INPATIENT
Status: DC
  Administered 2016-11-14 – 2016-11-15 (×2)

## 2016-11-14 MED ORDER — AMIODARONE HCL IN DEXTROSE 360-4.14 MG/200ML-% IV SOLN
30.0000 mg/h | INTRAVENOUS | Status: DC
  Administered 2016-11-14 – 2016-11-15 (×3): 30 mg/h via INTRAVENOUS
  Filled 2016-11-14 (×3): qty 200

## 2016-11-14 MED ORDER — FUROSEMIDE 20 MG PO TABS: 20.0000 mg | ORAL_TABLET | Freq: Every day | ORAL | Status: DC

## 2016-11-14 NOTE — Progress Notes (Signed)
PROGRESS NOTE    MARION ROSENBERRY  RDE:[id number] DOB: [birth year] DOA: 11/12/2016 PCP: Patient, No Pcp Per    Brief Narrative: Reynolds Bowl Justiceis an 83 y.o.malewith hx of permanent afib on coumadin, CKD, DM on Metformin, hypothyroidism, brought to the ER as he was SOB. He was found to have afib with RVR, and CXR was clear and serology was unremarkable. He was seen in consultation with cardiology, and Dr Harl Bowie felt that his SOB was likely due to tachyarrythmia. He recommended using Amiodarone once more, as his QTc limited the use of other antiarrythmic drug. SInce amiodarone load and drip, his HR has improved.  He has been adequately anticoagulated, and cardioversion may be required per cardiology.    Assessment & Plan:   Principal Problem:   Atrial fibrillation with RVR (HCC) Active Problems:   Type 2 diabetes mellitus with diabetic nephropathy (HCC)   Hypertension   Hypothyroidism   Pulmonary hypertension (HCC)   Chronic diastolic heart failure (HCC)   HLD (hyperlipidemia)   Lumbar back sprain   Acute on chronic diastolic CHF (congestive heart failure) (HCC)   Acute congestive heart failure (HCC)   Permanent Afib with RVR: Cardiology was consulted, and has restarted Amiodarone with IV bolus and drip. Admit to SDU. D/C Cardiazem, and continue with lower BB dose per Dr Harl Bowie. Continue with coumadin per pharmacy dosing.  Rate is a little better.  Suspect it will be controlled with Amiodarone as it had in the past.   Hypothryodism: Continue with supplement. Will check TSH.  Acute on chronic diastolic CHF: Will diurese. Follow Cr.   DM: Will cover SSI as required.   DVT prophylaxis:Coumadin . Code Status:FULL CODE.  Family Communication:None at bedside.  Disposition Plan:Home.  Consults called:Cardiology.  Admission status:inpatient.    Antimicrobials: Anti-infectives    None       Subjective:  " I am OK"   Objective: Vitals:   11/14/16 0100  11/14/16 0130 11/14/16 0400 11/14/16 0500  BP: (!) 126/96 128/82    Pulse:      Resp: (!) 29 19    Temp:   97.6 F (36.4 C)   TempSrc:   Oral   SpO2:      Weight:    97.7 kg (215 lb 6.2 oz)  Height:        Intake/Output Summary (Last 24 hours) at 11/14/16 0808 Last data filed at 11/14/16 0500  Gross per 24 hour  Intake            69.31 ml  Output              500 ml  Net          -430.69 ml   Filed Weights   11/12/16 2052 11/13/16 0400 11/14/16 0500  Weight: 97.9 kg (215 lb 13.3 oz) 97.9 kg (215 lb 13.3 oz) 97.7 kg (215 lb 6.2 oz)    Examination:  General exam: Appears calm and comfortable  Respiratory system: Clear to auscultation. Respiratory effort normal. Cardiovascular system: S1 & S2 heard, irregular. No JVD, murmurs, rubs, gallops or clicks. No pedal edema. Gastrointestinal system: Abdomen is nondistended, soft and nontender. No organomegaly or masses felt. Normal bowel sounds heard. Central nervous system: Alert and oriented. No focal neurological deficits. Extremities: Symmetric 5 x 5 power. Skin: No rashes, lesions or ulcers Psychiatry: Judgement and insight appear normal. Mood & affect appropriate.   Data Reviewed: I have personally reviewed following labs and imaging studies  CBC:  Recent Labs  Lab 11/12/16 0953 11/13/16 0435  WBC 6.3 5.2  NEUTROABS 4.2  --   HGB 12.7* 12.0*  HCT 38.6* 36.5*  MCV 92.8 93.4  PLT 189 973   Basic Metabolic Panel:  Recent Labs Lab 11/12/16 0953 11/13/16 0435  NA 136 138  K 4.3 4.5  CL 102 103  CO2 24 29  GLUCOSE 136* 134*  BUN 16 15  CREATININE 1.38* 1.34*  CALCIUM 9.0 8.8*   GFR: Estimated Creatinine Clearance: 51.5 mL/min (A) (by C-G formula based on SCr of 1.34 mg/dL (H)). Liver Function Tests:  Recent Labs Lab 11/12/16 1505 11/13/16 0435  AST 22 16  ALT 27 22  ALKPHOS 88 82  BILITOT 1.5* 0.9  PROT 6.8 6.3*  ALBUMIN 3.9 3.7   Coagulation Profile:  Recent Labs Lab 11/12/16 0953  11/13/16 0435 11/14/16 0412  INR 2.71 3.08 2.56   Cardiac Enzymes:  Recent Labs Lab 11/12/16 0953  TROPONINI <0.03   CBG:  Recent Labs Lab 11/12/16 2117 11/13/16 0757 11/13/16 1207 11/13/16 1630 11/14/16 0754  GLUCAP 140* 113* 125* 106* 120*   Thyroid Function Tests:  Recent Labs  11/12/16 1006  TSH 2.228    Radiology Studies: Dg Chest 2 View  Result Date: 11/12/2016 CLINICAL DATA:  Shortness of breath, atrial fibrillation EXAM: CHEST  2 VIEW COMPARISON:  10/22/2016 FINDINGS: Stable calcified left midlung granuloma peripherally. Borderline cardiac enlargement without edema or CHF. No focal pneumonia, collapse or consolidation. No large effusion or pneumothorax. Trachea is midline. Degenerative changes of the spine. IMPRESSION: Stable chest exam without acute process. Remote granulomatous disease. Electronically Signed   By: Jerilynn Mages.  Shick M.D.   On: 11/12/2016 10:28    Scheduled Meds: . docusate sodium  100 mg Oral BID  . doxazosin  2 mg Oral QHS  . levothyroxine  50 mcg Oral QAC breakfast  . metFORMIN  500 mg Oral BID WC  . metoprolol tartrate  50 mg Oral BID   Continuous Infusions: . amiodarone 30 mg/hr (11/14/16 0503)     LOS: 2 days   Jermya Dowding, MD FACP Hospitalist.   If 7PM-7AM, please contact night-coverage [email or web address] Password [email or web address] 11/14/2016, 8:08 AM

## 2016-11-14 NOTE — Plan of Care (Signed)
Problem: Safety: Goal: Ability to remain free from injury will improve Outcome: Completed/Met Date Met: 11/14/16 Patient is A&O, aware of his surroundings, frequently asking for help when ambulating and recognizes when he needs assistance. Steady gait noted at this time.

## 2016-11-14 NOTE — Progress Notes (Signed)
He is scheduled for cardioversion tomorrow at [facility]. Given his prior issues with pauses, we will hold his AM lopressor. Order EKG for tomorrow at [facility]

## 2016-11-14 NOTE — Progress Notes (Signed)
5927 Ted hose (size medium) placed on BLE as ordered. Patient tolerated well w/no c/o discomfort or pain reported.

## 2016-11-14 NOTE — Progress Notes (Signed)
Progress Note  Patient Name: Brett [name] Date of Encounter: 11/14/2016  Primary Cardiologist:   Subjective   Complaints of continued LEE. Anxious to get moving.   Inpatient Medications    Scheduled Meds: . docusate sodium  100 mg Oral BID  . doxazosin  2 mg Oral QHS  . levothyroxine  50 mcg Oral QAC breakfast  . metFORMIN  500 mg Oral BID WC  . metoprolol tartrate  50 mg Oral BID   Continuous Infusions: . amiodarone 30 mg/hr (11/14/16 0503)   PRN Meds: zolpidem   Vital Signs    Vitals:   11/14/16 0100 11/14/16 0130 11/14/16 0400 11/14/16 0500  BP: (!) 126/96 128/82    Pulse:      Resp: (!) 29 19    Temp:   97.6 F (36.4 C)   TempSrc:   Oral   SpO2:      Weight:    215 lb 6.2 oz (97.7 kg)  Height:        Intake/Output Summary (Last 24 hours) at 11/14/16 0756 Last data filed at 11/14/16 0500  Gross per 24 hour  Intake            69.31 ml  Output              500 ml  Net          -430.69 ml   Filed Weights   11/12/16 2052 11/13/16 0400 11/14/16 0500  Weight: 215 lb 13.3 oz (97.9 kg) 215 lb 13.3 oz (97.9 kg) 215 lb 6.2 oz (97.7 kg)    Telemetry    Atrial fib rates 90's to 114 bpm.  Personally Reviewed  Physical Exam   GEN: No acute distress.   Neck: No JVD Cardiac: IRRR, tachycardic, no murmurs, rubs, or gallops.  Respiratory: Clear to auscultation bilaterally. GI: Soft, nontender, non-distended  MS: 2+ pitting pretibial and ankle edema; No deformity. Neuro:  Nonfocal  Psych: Normal affect   Labs    Chemistry Recent Labs Lab 11/12/16 0953 11/12/16 1505 11/13/16 0435  NA 136  --  138  K 4.3  --  4.5  CL 102  --  103  CO2 24  --  29  GLUCOSE 136*  --  134*  BUN 16  --  15  CREATININE 1.38*  --  1.34*  CALCIUM 9.0  --  8.8*  PROT  --  6.8 6.3*  ALBUMIN  --  3.9 3.7  AST  --  22 16  ALT  --  27 22  ALKPHOS  --  88 82  BILITOT  --  1.5* 0.9  GFRNONAA 46*  --  48*  GFRAA 53*  --  55*  ANIONGAP 10  --  6     Hematology Recent  Labs Lab 11/12/16 0953 11/13/16 0435  WBC 6.3 5.2  RBC 4.16* 3.91*  HGB 12.7* 12.0*  HCT 38.6* 36.5*  MCV 92.8 93.4  MCH 30.5 30.7  MCHC 32.9 32.9  RDW 14.1 14.0  PLT 189 158    Cardiac Enzymes Recent Labs Lab 11/12/16 0953  TROPONINI <0.03   No results for input(s): TROPIPOC in the last 168 hours.   BNP Recent Labs Lab 11/12/16 0958  BNP 572.0*      Radiology    Dg Chest 2 View  Result Date: 11/12/2016 CLINICAL DATA:  Shortness of breath, atrial fibrillation EXAM: CHEST  2 VIEW COMPARISON:  10/22/2016 FINDINGS: Stable calcified left midlung granuloma peripherally. Borderline cardiac enlargement  without edema or CHF. No focal pneumonia, collapse or consolidation. No large effusion or pneumothorax. Trachea is midline. Degenerative changes of the spine. IMPRESSION: Stable chest exam without acute process. Remote granulomatous disease. Electronically Signed   By: Jerilynn Mages.  Shick M.D.   On: 11/12/2016 10:28    Cardiac Studies  Echocardiogram 12/10/2013 Procedure narrative: Patient in rapid atrial fibrillation. - Left ventricle: Systolic function was normal. The estimated ejection fraction was in the range of 50% to 55%. Wall motion was normal; there were no regional wall motion abnormalities. - Mitral valve: Mildly calcified annulus. Mildly thickened leaflets . There was trivial regurgitation. - Left atrium: No evidence of thrombus in the atrial cavity or appendage. Normal velocities noted. - Atrial septum: No defect or patent foramen ovale was identified   Patient Profile     83 y.o. male history of CAD with PCI of large 1st diagonal on 08/25/2007 with balloon angioplasty (no stent placed). Had 30-40% mid RCA disease and 20-30% mid LAD disease. LCx with only luminal irregularities, performed by Dr. Chad Cordial in [place], [place]. Also history of chronic diastolic HF,permanent afib.He has recently been off coumadin for ERCP and sphincertomy for recent admission with  symptomatic gallstones. Presents with increasing SOB. He reports significant progressing LE edema over the same time as well as orthopnea.   Assessment & Plan    1. 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 Plan for DCCV tomorrow if remains in afib.   2. Acute on Chronic Systolic CHF:  Has diuresed 2.269.7 liters since admission on IV lasix 20 mg given last two days.Will redose lasix 20mg  IV x 1 today. I will add TED hose to help with venous return. He remains on low sodium diet.   Signed, Phill Myron. West Pugh, ANP, AACC   11/14/2016, 7:56 AM    Attending note Patient seen and discussed with DNP Purcell Nails, I agree with her documentation above. Remains in afib, we will rebolus amiodarone and continue IV drip today. If does not convert plan for DCCV tomorrow. Negative 2.2 liters since admission, renal function remains stable. Continue IV lasix today. Order echo  [name] MD

## 2016-11-14 NOTE — Progress Notes (Signed)
Consent for electrical cardioversion obtained and placed on patient's chart as ordered.

## 2016-11-14 NOTE — Progress Notes (Signed)
*  PRELIMINARY RESULTS* Echocardiogram 2D Echocardiogram has been performed.  Leavy Cella 11/14/2016, 3:00 PM

## 2016-11-14 NOTE — Progress Notes (Signed)
Brett [name] for coumadin Indication: atrial fibrillation  No Known Allergies  Patient Measurements: Height: 6' (182.9 cm) Weight: 215 lb 6.2 oz (97.7 kg) IBW/kg (Calculated) : 77.6  Vital Signs: Temp: 98 F (36.7 C) (08/02 0800) Temp Source: Oral (08/02 0800) BP: 128/82 (08/02 0130)  Labs:  Recent Labs  11/12/16 0953 11/13/16 0435 11/14/16 0412  HGB 12.7* 12.0*  --   HCT 38.6* 36.5*  --   PLT 189 158  --   LABPROT 29.3* 32.5* 28.0*  INR 2.71 3.08 2.56  CREATININE 1.38* 1.34*  --   TROPONINI <0.03  --   --    Estimated Creatinine Clearance: 51.5 mL/min (A) (by C-G formula based on SCr of 1.34 mg/dL (H)).   Medical History: Past Medical History:  Diagnosis Date  . Atrial fibrillation ([place])    On coumadin  . Chronic kidney disease (CKD), stage III (moderate)   . Dysrhythmia    AFib  . Essential hypertension, benign   . History of cardiac catheterization    Details not certain - reportedly no interventions  . Hypothyroidism   . Macular degeneration   . Pulmonary hypertension ([place]) 12/03/2013   PA pressure 56. Ejection fraction 55-60%  . Type 2 diabetes mellitus (HCC)     Medications:  Prescriptions Prior to Admission  Medication Sig Dispense Refill Last Dose  . diltiazem (CARDIZEM CD) 360 MG 24 hr capsule Take 1 capsule (360 mg total) by mouth daily. 30 capsule 0 11/12/2016 at Unknown time  . doxazosin (CARDURA) 2 MG tablet Take 1 tablet (2 mg total) by mouth at bedtime. 90 tablet 3 11/11/2016 at Unknown time  . furosemide (LASIX) 40 MG tablet Take 1 tablet (40 mg total) by mouth daily. 90 tablet 3   . levothyroxine (SYNTHROID, LEVOTHROID) 50 MCG tablet Take 50 mcg by mouth daily.   11/12/2016 at Unknown time  . metFORMIN (GLUCOPHAGE) 500 MG tablet Take 500 mg by mouth 2 (two) times daily with a meal.    11/12/2016 at Unknown time  . Metoprolol Tartrate 75 MG TABS Take 75 mg by mouth 2 (two) times daily. 60 tablet 0 11/12/2016 at  630  . warfarin (COUMADIN) 3 MG tablet Take 1 tablet (3 mg total) by mouth daily at 6 PM. 30 tablet 0 11/11/2016 at 2300  . docusate sodium (COLACE) 100 MG capsule Take 100 mg by mouth 2 (two) times daily.   Past Week at Unknown time   Assessment: 83 yo man to continue coumadin for afib.  INR is therapeutic today.     Goal of Therapy:  INR 2-3 Monitor platelets by anticoagulation protocol: Yes   Plan:  Coumadin 2.5mg  po today x 1 Daily PT/INR  Monitor for bleeding complications  Hart Robinsons, PharmD Clinical Pharmacist Pager:  [phone] 11/14/2016   11/14/2016,10:31 AM

## 2016-11-15 ENCOUNTER — Encounter (HOSPITAL_COMMUNITY): Payer: Self-pay | Admitting: *Deleted

## 2016-11-15 ENCOUNTER — Inpatient Hospital Stay (HOSPITAL_COMMUNITY): Payer: Medicare HMO | Admitting: Anesthesiology

## 2016-11-15 ENCOUNTER — Encounter (HOSPITAL_COMMUNITY)
Admission: EM | Disposition: A | Discharge status: Home / Self Care | Payer: Self-pay | Source: Home / Self Care | Attending: Internal Medicine

## 2016-11-15 DIAGNOSIS — I4891 Unspecified atrial fibrillation: Secondary | ICD-10-CM

## 2016-11-15 HISTORY — PX: CARDIOVERSION: SHX1299

## 2016-11-15 LAB — BASIC METABOLIC PANEL
Anion gap: 8 (ref 5–15)
BUN: 16 mg/dL (ref 6–20)
CO2: 29 mmol/L (ref 22–32)
Calcium: 8.6 mg/dL — ABNORMAL LOW (ref 8.9–10.3)
Chloride: 97 mmol/L — ABNORMAL LOW (ref 101–111)
Creatinine, Ser: 1.29 mg/dL — ABNORMAL HIGH (ref 0.61–1.24)
GFR calc Af Amer: 58 mL/min — ABNORMAL LOW (ref >60)
GFR calc non Af Amer: 50 mL/min — ABNORMAL LOW (ref >60)
Glucose, Bld: 123 mg/dL — ABNORMAL HIGH (ref 65–99)
Potassium: 3.6 mmol/L (ref 3.5–5.1)
Sodium: 134 mmol/L — ABNORMAL LOW (ref 135–145)

## 2016-11-15 LAB — GLUCOSE, CAPILLARY: Glucose-Capillary: 117 mg/dL — ABNORMAL HIGH (ref 65–99)

## 2016-11-15 LAB — PROTIME-INR
INR: 2.11
Prothrombin Time: 24 seconds — ABNORMAL HIGH (ref 11.4–15.2)

## 2016-11-15 SURGERY — CARDIOVERSION
Anesthesia: Monitor Anesthesia Care

## 2016-11-15 MED ORDER — PROPOFOL 10 MG/ML IV BOLUS
INTRAVENOUS | Status: AC
  Filled 2016-11-15: qty 20

## 2016-11-15 MED ORDER — AMIODARONE HCL 200 MG PO TABS
400.0000 mg | ORAL_TABLET | Freq: Two times a day (BID) | ORAL | Status: DC
  Administered 2016-11-15 – 2016-11-16 (×3): 400 mg via ORAL
  Filled 2016-11-15 (×3): qty 2

## 2016-11-15 MED ORDER — FENTANYL CITRATE (PF) 250 MCG/5ML IJ SOLN
INTRAMUSCULAR | Status: AC
Start: 2016-11-15 — End: 2016-11-15
  Filled 2016-11-15: qty 5

## 2016-11-15 MED ORDER — WARFARIN SODIUM 2 MG PO TABS
3.0000 mg | ORAL_TABLET | Freq: Once | ORAL | Status: AC
  Administered 2016-11-15: 3 mg via ORAL
  Filled 2016-11-15: qty 1

## 2016-11-15 MED ORDER — MIDAZOLAM HCL 2 MG/2ML IJ SOLN
INTRAMUSCULAR | Status: AC
  Filled 2016-11-15: qty 2

## 2016-11-15 MED ORDER — LACTATED RINGERS IV SOLN
INTRAVENOUS | Status: DC
  Administered 2016-11-15: 09:00:00 via INTRAVENOUS

## 2016-11-15 MED ORDER — MIDAZOLAM HCL 2 MG/2ML IJ SOLN
1.0000 mg | INTRAMUSCULAR | Status: AC
  Administered 2016-11-15: 2 mg via INTRAVENOUS

## 2016-11-15 MED ORDER — PHENYLEPHRINE 40 MCG/ML (10ML) SYRINGE FOR IV PUSH (FOR BLOOD PRESSURE SUPPORT)
PREFILLED_SYRINGE | INTRAVENOUS | Status: AC
  Filled 2016-11-15: qty 10

## 2016-11-15 MED ORDER — SODIUM CHLORIDE 0.9 % IJ SOLN
INTRAMUSCULAR | Status: AC
  Filled 2016-11-15: qty 10

## 2016-11-15 MED ORDER — EPHEDRINE SULFATE 50 MG/ML IJ SOLN
INTRAMUSCULAR | Status: AC
  Filled 2016-11-15: qty 1

## 2016-11-15 MED ORDER — FUROSEMIDE 10 MG/ML IJ SOLN
40.0000 mg | Freq: Once | INTRAMUSCULAR | Status: AC
  Administered 2016-11-15: 40 mg via INTRAVENOUS
  Filled 2016-11-15: qty 4

## 2016-11-15 MED ORDER — LIDOCAINE HCL (PF) 1 % IJ SOLN
INTRAMUSCULAR | Status: AC
  Filled 2016-11-15: qty 5

## 2016-11-15 MED ORDER — SUCCINYLCHOLINE CHLORIDE 20 MG/ML IJ SOLN
INTRAMUSCULAR | Status: AC
Start: 2016-11-15 — End: 2016-11-15
  Filled 2016-11-15: qty 1

## 2016-11-15 MED ORDER — AMIODARONE HCL IN DEXTROSE 360-4.14 MG/200ML-% IV SOLN
INTRAVENOUS | Status: DC | PRN
  Administered 2016-11-15: 16.7 mg/h via INTRAVENOUS

## 2016-11-15 MED ORDER — PROPOFOL 10 MG/ML IV BOLUS
INTRAVENOUS | Status: AC
  Filled 2016-11-15: qty 40

## 2016-11-15 MED ORDER — PROPOFOL 500 MG/50ML IV EMUL
INTRAVENOUS | Status: DC | PRN
  Administered 2016-11-15: 150 ug/kg/min via INTRAVENOUS

## 2016-11-15 NOTE — Transfer of Care (Signed)
Immediate Anesthesia Transfer of Care Note  Patient: Brett [name]  Procedure(s) Performed: Procedure(s): CARDIOVERSION (N/A)  Patient Location: PACU  Anesthesia Type:MAC  Level of Consciousness: awake, oriented and patient cooperative  Airway & Oxygen Therapy: Patient Spontanous Breathing and Patient connected to face mask oxygen  Post-op Assessment: Report given to RN and Post -op Vital signs reviewed and stable  Post vital signs: Reviewed and stable  Last Vitals:  Vitals:   11/15/16 0900 11/15/16 0915  BP: 126/80   Pulse:    Resp: (!) 0 (!) 0  Temp:      Last Pain:  Vitals:   11/15/16 0817  TempSrc: Oral  PainSc: 0-No pain      Patients Stated Pain Goal: 8 (14/27/67 0110)  Complications: No apparent anesthesia complications

## 2016-11-15 NOTE — Care Management (Addendum)
Appointment made for PCP-Dr. Kim-August 8th at 1125. Added to AVS.

## 2016-11-15 NOTE — Anesthesia Postprocedure Evaluation (Signed)
Anesthesia Post Note  Patient: Brett [name]  Procedure(s) Performed: Procedure(s) (LRB): CARDIOVERSION (N/A)  Patient location during evaluation: PACU Anesthesia Type: MAC Level of consciousness: awake and alert, oriented and patient cooperative Pain management: pain level controlled Vital Signs Assessment: post-procedure vital signs reviewed and stable Respiratory status: spontaneous breathing and patient connected to face mask oxygen Cardiovascular status: stable Postop Assessment: no signs of nausea or vomiting Anesthetic complications: no     Last Vitals:  Vitals:   11/15/16 0900 11/15/16 0915  BP: 126/80   Pulse:    Resp: (!) 0 (!) 0  Temp:      Last Pain:  Vitals:   11/15/16 0817  TempSrc: Oral  PainSc: 0-No pain                 Ardith Lewman A

## 2016-11-15 NOTE — Progress Notes (Signed)
Progress Note  Patient Name: Brett [name] Date of Encounter: 11/15/2016   Subjective   No complaints overnight.   Inpatient Medications    Scheduled Meds: . [MAR Hold] docusate sodium  100 mg Oral BID  . [MAR Hold] doxazosin  2 mg Oral QHS  . [MAR Hold] levothyroxine  50 mcg Oral QAC breakfast  . [MAR Hold] metFORMIN  500 mg Oral BID WC  . [MAR Hold] sodium chloride flush  3 mL Intravenous Q12H  . [MAR Hold] Warfarin - Pharmacist Dosing Inpatient   Does not apply Q24H   Continuous Infusions: . sodium chloride    . amiodarone 30 mg/hr (11/15/16 0413)  . lactated ringers 75 mL/hr at 11/15/16 0846   PRN Meds: [MAR Hold] sodium chloride flush, [MAR Hold] zolpidem   Vital Signs    Vitals:   11/15/16 0830 11/15/16 0845 11/15/16 0900 11/15/16 0915  BP: (!) 142/100 (!) 137/95 126/80   Pulse:      Resp: (!) 0 (!) 0 (!) 0 (!) 0  Temp:      TempSrc:      SpO2: 98% 100% 100% 100%  Weight:      Height:        Intake/Output Summary (Last 24 hours) at 11/15/16 0918 Last data filed at 11/15/16 0500  Gross per 24 hour  Intake              480 ml  Output             1200 ml  Net             -720 ml   Filed Weights   11/13/16 0400 11/14/16 0500 11/15/16 0500  Weight: 215 lb 13.3 oz (97.9 kg) 215 lb 6.2 oz (97.7 kg) 212 lb 4.9 oz (96.3 kg)    Telemetry    afib rvr  ECG    n/a  Physical Exam   GEN: No acute distress.   Neck: mildly elevated JVD Cardiac: irreg, no m/r/g Respiratory: Clear to auscultation bilaterally. GI: Soft, nontender, non-distended  MS: No edema; No deformity. Neuro:  Nonfocal  Psych: Normal affect   Labs    Chemistry Recent Labs Lab 11/12/16 0953 11/12/16 1505 11/13/16 0435 11/15/16 0419  NA 136  --  138 134*  K 4.3  --  4.5 3.6  CL 102  --  103 97*  CO2 24  --  29 29  GLUCOSE 136*  --  134* 123*  BUN 16  --  15 16  CREATININE 1.38*  --  1.34* 1.29*  CALCIUM 9.0  --  8.8* 8.6*  PROT  --  6.8 6.3*  --   ALBUMIN  --  3.9 3.7   --   AST  --  22 16  --   ALT  --  27 22  --   ALKPHOS  --  88 82  --   BILITOT  --  1.5* 0.9  --   GFRNONAA 46*  --  48* 50*  GFRAA 53*  --  55* 58*  ANIONGAP 10  --  6 8     Hematology Recent Labs Lab 11/12/16 0953 11/13/16 0435  WBC 6.3 5.2  RBC 4.16* 3.91*  HGB 12.7* 12.0*  HCT 38.6* 36.5*  MCV 92.8 93.4  MCH 30.5 30.7  MCHC 32.9 32.9  RDW 14.1 14.0  PLT 189 158    Cardiac Enzymes Recent Labs Lab 11/12/16 0953  TROPONINI <0.03   No results for  input(s): TROPIPOC in the last 168 hours.   BNP Recent Labs Lab 11/12/16 0958  BNP 572.0*     DDimer No results for input(s): DDIMER in the last 168 hours.   Radiology    No results found.  Cardiac Studies    Patient Profile    83 y.o. male history of CAD with PCI of large 1st diagonal on 08/25/2007 with balloon angioplasty (no stent placed). Had 30-40% mid RCA disease and 20-30% mid LAD disease. LCx with only luminal irregularities, performed by Dr. Chad Cordial in [place], [place]. Also history of chronic diastolic HF,permanent afib.He has recently been off coumadin for ERCP and sphincertomy for recent admission with symptomatic gallstones. Presents with increasing SOB. He reports significant progressing LE edema over the same time as well as orthopnea.     Assessment & Plan  1. Afib with RVR/Tachy brady syndrome - remains in afib, he has been loaded with IV amiodarone. Plans for DCCV today. Beta blocker held this AM, may restart pending rates after cardioversion. Will need to convert to oral amio if succesfull cardioversion this AM - continue coumadin for stroke prevention  2. Acute on chronic diastolic HF - echo LVEF 45-80%, cannot eval diastolic function due to afib but biatrial enlargement is suggestive. Exacerbating factor for CHF likely afib with elevated rates - negative 2.9 liters since admission. Downtrend in Cr with diuresis consistent with venous congestion and CHF - still some evidence of fluid  overload on exam though improving, will give IV lasix 40mg  x 1. Will need lasix 20mg  daily at discharge.   3. Aortic regurgitation - mild to moderate by echo, continue to monitor with serial echos over time  Signed, [name], MD  11/15/2016, 9:18 AM

## 2016-11-15 NOTE — Progress Notes (Signed)
Back from cardioversion. Sinus rhythm with PAC's which Dr. Is aware of. No complaints of pain and IV fluids have been stopped.

## 2016-11-15 NOTE — Progress Notes (Signed)
Electrical Cardioversion Procedure Note Brett [name] [id number] 1932-11-28  Procedure: Electrical Cardioversion Indications:  Atrial Fibrillation  Procedure Details Consent: Risks of procedure as well as the alternatives and risks of each were explained to the (patient/caregiver).  Consent for procedure obtained. Time Out: Verified patient identification, verified procedure, site/side was marked, verified correct patient position, special equipment/implants available, medications/allergies/relevent history reviewed, required imaging and test results available.  Performed  Patient placed on cardiac monitor, pulse oximetry, supplemental oxygen as necessary.  Sedation given: See CRNA Note Pacer pads placed anterior and posterior chest.  Cardioverted 1 time(s).  Cardioverted at 200 @ 9:30 AM  Evaluation Findings: Post procedure EKG shows: NSR Complications: None Patient did tolerate procedure well.   Elizabeth Sauer 11/15/2016, 9:29 AM

## 2016-11-15 NOTE — Progress Notes (Signed)
Succesful cardioversion. We will d/c IV amiodarone, start oral 400mg  bid. Rates in 14s currently, hold on any av nodal agents at this time. Would monitor today along with additional IV diuresis, possible discharge tomorrow. Would plan for amio 400mg  bid x 1 week, then 200mg  bid x 2 weeks, then 200mg  daily.   [name] MD

## 2016-11-15 NOTE — Addendum Note (Signed)
Addendum  created 11/15/16 1554 by Vista Deck, CRNA   Charge Capture section accepted

## 2016-11-15 NOTE — Progress Notes (Signed)
ANTICOAGULATION CONSULT NOTE   Pharmacy Consult for coumadin Indication: atrial fibrillation  No Known Allergies  Patient Measurements: Height: 6' (182.9 cm) Weight: 212 lb 4.9 oz (96.3 kg) IBW/kg (Calculated) : 77.6  Vital Signs: Temp: 98.7 F (37.1 C) (08/03 0940) Temp Source: Oral (08/03 0817) BP: 120/66 (08/03 1006) Pulse Rate: 47 (08/03 1006)  Labs:  Recent Labs  11/13/16 0435 11/14/16 0412 11/15/16 0419  HGB 12.0*  --   --   HCT 36.5*  --   --   PLT 158  --   --   LABPROT 32.5* 28.0* 24.0*  INR 3.08 2.56 2.11  CREATININE 1.34*  --  1.29*   Estimated Creatinine Clearance: 53.1 mL/min (A) (by C-G formula based on SCr of 1.29 mg/dL (H)).   Medical History: Past Medical History:  Diagnosis Date  . Atrial fibrillation ([facility])    On coumadin  . Chronic kidney disease (CKD), stage III (moderate)   . Dysrhythmia    AFib  . Essential hypertension, benign   . History of cardiac catheterization    Details not certain - reportedly no interventions  . Hypothyroidism   . Macular degeneration   . Pulmonary hypertension ([facility]) 12/03/2013   PA pressure 56. Ejection fraction 55-60%  . Type 2 diabetes mellitus ([facility])     Medications:  Prescriptions Prior to Admission  Medication Sig Dispense Refill Last Dose  . diltiazem (CARDIZEM CD) 360 MG 24 hr capsule Take 1 capsule (360 mg total) by mouth daily. 30 capsule 0 11/12/2016 at Unknown time  . doxazosin (CARDURA) 2 MG tablet Take 1 tablet (2 mg total) by mouth at bedtime. 90 tablet 3 11/11/2016 at Unknown time  . furosemide (LASIX) 40 MG tablet Take 1 tablet (40 mg total) by mouth daily. 90 tablet 3   . levothyroxine (SYNTHROID, LEVOTHROID) 50 MCG tablet Take 50 mcg by mouth daily.   11/12/2016 at Unknown time  . metFORMIN (GLUCOPHAGE) 500 MG tablet Take 500 mg by mouth 2 (two) times daily with a meal.    11/12/2016 at Unknown time  . Metoprolol Tartrate 75 MG TABS Take 75 mg by mouth 2 (two) times daily. 60 tablet 0 11/12/2016  at 630  . warfarin (COUMADIN) 3 MG tablet Take 1 tablet (3 mg total) by mouth daily at 6 PM. 30 tablet 0 11/11/2016 at 2300  . docusate sodium (COLACE) 100 MG capsule Take 100 mg by mouth 2 (two) times daily.   Past Week at Unknown time   Assessment: 83 yo man to continue coumadin for afib.  INR is 2.11 today and trending down.  He did not receive any coumadin on 7/31 and 8/1.  Goal of Therapy:  INR 2-3 Monitor platelets by anticoagulation protocol: Yes   Plan:  Coumadin 4mg  po today x 1 Daily PT/INR  Monitor for bleeding complications  Excell Seltzer, PharmD Clinical Pharmacist 11/15/2016,10:57 AM

## 2016-11-15 NOTE — Anesthesia Preprocedure Evaluation (Signed)
Anesthesia Evaluation  Patient identified by MRN, date of birth, ID band Patient awake    Reviewed: Allergy & Precautions, H&P , NPO status , Patient's Chart, lab work & pertinent test results, reviewed documented beta blocker date and time   Airway Mallampati: III  TM Distance: >3 FB Neck ROM: Full    Dental  (+) Edentulous Upper, Poor Dentition   Pulmonary shortness of breath and at rest, former smoker,   Mild wheezing  breath sounds clear to auscultation (-) wheezing      Cardiovascular hypertension, Pt. on home beta blockers and Pt. on medications +CHF and + DOE  + dysrhythmias Atrial Fibrillation  Rhythm:Irregular Rate:Tachycardia     Neuro/Psych    GI/Hepatic neg GERD  ,  Endo/Other  diabetes, Well Controlled, Type 2, Oral Hypoglycemic AgentsHypothyroidism   Renal/GU Renal InsufficiencyRenal disease     Musculoskeletal  (+) Arthritis , Bilateral hip replacement   Abdominal   Peds  Hematology   Anesthesia Other Findings   Reproductive/Obstetrics                             Anesthesia Physical Anesthesia Plan  ASA: III  Anesthesia Plan: MAC   Post-op Pain Management:    Induction: Intravenous  PONV Risk Score and Plan:   Airway Management Planned: Simple Face Mask  Additional Equipment:   Intra-op Plan:   Post-operative Plan:   Informed Consent: I have reviewed the patients History and Physical, chart, labs and discussed the procedure including the risks, benefits and alternatives for the proposed anesthesia with the patient or authorized representative who has indicated his/her understanding and acceptance.     Plan Discussed with:   Anesthesia Plan Comments:         Anesthesia Quick Evaluation

## 2016-11-15 NOTE — Progress Notes (Signed)
PROGRESS NOTE    STACIE KNUTZEN  OHY:[id number] DOB: 07/18/1932 DOA: 11/12/2016 PCP: Patient, No Pcp Per    Brief Narrative:  Reynolds Bowl Justiceis an 83 y.o.malewith hx of permanentafib on coumadin, CKD, DM on Metformin, hypothyroidism, brought to the ER as he was SOB. He was found to have afib with RVR, and CXR was clear and serology was unremarkable. He was seen in consultation with cardiology, and Dr Harl Bowie felt that his SOB was likely due to tachyarrythmia. He recommended using Amiodarone once more, as his QTc limited the use of other antiarrythmic drug. SInce amiodarone load and drip, his HR has improved.  He has been adequately anticoagulated, and today, he was succesfully cardioverted.    Assessment & Plan:   Principal Problem:   Atrial fibrillation with RVR ([facility]) Active Problems:   Type 2 diabetes mellitus with diabetic nephropathy (HCC)   Hypertension   Hypothyroidism   Pulmonary hypertension (HCC)   Chronic diastolic heart failure (HCC)   HLD (hyperlipidemia)   Lumbar back sprain   Acute on chronic diastolic CHF (congestive heart failure) (HCC)   Acute congestive heart failure (HCC)  Permanent Afib with RVR: Cardiology was consulted, and has restarted Amiodarone with IV bolus and drip. Admit to SDU. D/C Cardiazem, and continue with lower BB dose per Dr Harl Bowie. Continue with coumadin per pharmacy dosing. Rate is a little better. Suspect it will be controlled with Amiodarone as it had in the past.  He was cardiovert today successfully, and is being converted to oral Amiodarone.  Cardiology [facility] for discharge tomorrow if stable.   Hypothryodism: Continue with supplement. Will check TSH.  Acute on chronic diastolic CHF: Continue with diurese today.  Cr remained stable.   DM: Will cover SSI as required.   DVT prophylaxis:Coumadin . Code Status:FULL CODE.  Family Communication:None at bedside.  Disposition Plan:Home.  Consults called:Cardiology.    Admission status:inpatient.   Antimicrobials: Anti-infectives    None       Subjective:  "I feel good"   Objective: Vitals:   11/15/16 0940 11/15/16 1000 11/15/16 1006 11/15/16 1132  BP: 107/65 109/70 120/66   Pulse: 62 (!) 38 (!) 47   Resp: (!) 24 19 (!) 21   Temp: 98.7 F (37.1 C)   97.7 F (36.5 C)  TempSrc:    Axillary  SpO2: 100% 99% 100%   Weight:      Height:        Intake/Output Summary (Last 24 hours) at 11/15/16 1155 Last data filed at 11/15/16 0934  Gross per 24 hour  Intake             1080 ml  Output              800 ml  Net              280 ml   Filed Weights   11/13/16 0400 11/14/16 0500 11/15/16 0500  Weight: 97.9 kg (215 lb 13.3 oz) 97.7 kg (215 lb 6.2 oz) 96.3 kg (212 lb 4.9 oz)    Examination:  General exam: Appears calm and comfortable  Respiratory system: Clear to auscultation. Respiratory effort normal. Cardiovascular system: S1 & S2 heard, RRR. No JVD, murmurs, rubs, gallops or clicks. No pedal edema. Gastrointestinal system: Abdomen is nondistended, soft and nontender. No organomegaly or masses felt. Normal bowel sounds heard. Central nervous system: Alert and oriented. No focal neurological deficits. Extremities: Symmetric 5 x 5 power. Skin: No rashes, lesions or ulcers Psychiatry: Judgement and  insight appear normal. Mood & affect appropriate.   Data Reviewed: I have personally reviewed following labs and imaging studies  CBC:  Recent Labs Lab 11/12/16 0953 11/13/16 0435  WBC 6.3 5.2  NEUTROABS 4.2  --   HGB 12.7* 12.0*  HCT 38.6* 36.5*  MCV 92.8 93.4  PLT 189 924   Basic Metabolic Panel:  Recent Labs Lab 11/12/16 0953 11/13/16 0435 11/15/16 0419  NA 136 138 134*  K 4.3 4.5 3.6  CL 102 103 97*  CO2 24 29 29   GLUCOSE 136* 134* 123*  BUN 16 15 16   CREATININE 1.38* 1.34* 1.29*  CALCIUM 9.0 8.8* 8.6*   GFR: Estimated Creatinine Clearance: 53.1 mL/min (A) (by C-G formula based on SCr of 1.29 mg/dL (H)). Liver  Function Tests:  Recent Labs Lab 11/12/16 1505 11/13/16 0435  AST 22 16  ALT 27 22  ALKPHOS 88 82  BILITOT 1.5* 0.9  PROT 6.8 6.3*  ALBUMIN 3.9 3.7   Coagulation Profile:  Recent Labs Lab 11/12/16 0953 11/13/16 0435 11/14/16 0412 11/15/16 0419  INR 2.71 3.08 2.56 2.11   Cardiac Enzymes:  Recent Labs Lab 11/12/16 0953  TROPONINI <0.03    CBG:  Recent Labs Lab 11/13/16 1630 11/14/16 0754 11/14/16 1119 11/14/16 1632 11/15/16 0727  GLUCAP 106* 120* 151* 109* 117*   Radiology Studies: No results found.  Scheduled Meds: . amiodarone  400 mg Oral BID  . docusate sodium  100 mg Oral BID  . doxazosin  2 mg Oral QHS  . levothyroxine  50 mcg Oral QAC breakfast  . metFORMIN  500 mg Oral BID WC  . sodium chloride flush  3 mL Intravenous Q12H  . warfarin  3 mg Oral Once  . Warfarin - Pharmacist Dosing Inpatient   Does not apply Q24H   Continuous Infusions: . sodium chloride       LOS: 3 days   Porschia Willbanks, MD FACP Hospitalist.   If 7PM-7AM, please contact night-coverage [email or web address] Password [email or web address] 11/15/2016, 11:55 AM

## 2016-11-15 NOTE — CV Procedure (Signed)
Procedure note  Procedure: electrical cardioversion Physician: Dr [name] Indication: afib with RVR   Patient was brought to the procedure suite after appropriate consent was obtained. Defib pads were placed in the anterior and posterior positions. Sedation was achieved with the assistance of anesthesiology, for details please refer to there note. The patient was succesfully cardioverted from afib with RVR to NSR with a single 200j synchronized shock. Cardiopulmonary monitoring was performed throughout the procedure, he tolerated well without complications.   [name] MD

## 2016-11-15 NOTE — Anesthesia Procedure Notes (Addendum)
Procedure Name: MAC Date/Time: 11/15/2016 9:23 AM Performed by: Andree Elk, AMY A Pre-anesthesia Checklist: Patient identified, Emergency Drugs available, Suction available, Patient being monitored and Timeout performed Oxygen Delivery Method: Simple face mask

## 2016-11-16 LAB — PROTIME-INR
INR: 2.13
PROTHROMBIN TIME: 24.2 s — AB (ref 11.4–15.2)

## 2016-11-16 MED ORDER — WARFARIN SODIUM 2 MG PO TABS: 3.0000 mg | ORAL_TABLET | Freq: Once | ORAL | Status: DC

## 2016-11-16 MED ORDER — AMIODARONE HCL 400 MG PO TABS: 400.0000 mg | ORAL_TABLET | Freq: Two times a day (BID) | ORAL | 1 refills | Status: DC

## 2016-11-16 MED ORDER — METFORMIN HCL 500 MG PO TABS: 500.0000 mg | ORAL_TABLET | Freq: Two times a day (BID) | ORAL | 1 refills | Status: DC

## 2016-11-16 NOTE — Discharge Summary (Signed)
Physician Discharge Summary  Brett [name] NUU:[id number] DOB: 11-24-1932 DOA: 11/12/2016  PCP: Brett [name], No Pcp Per  Admit date: 11/12/2016 Discharge date: 11/16/2016  Admitted From: Home.  Disposition:  Home.   Recommendations for Outpatient Follow-up:  1. Follow up with Dr Maudie Mercury, new PCP, on August 6. 2. Follow up with Dr Harl Bowie as scheduled.   Home Health: None.  Equipment/Devices: None.  Discharge Condition: in NSR.  Anticoagulated and therapeutic. Feels well.  CODE STATUS: FULL CODE.  Diet recommendation: cardiac diet, carb modified.   Brief/Interim Summary:  Brett [name] was admitted for afib with RVR and mild CHF by me on November 12, 2016.  As per my prior H and P:  " Brett [name] is an 83 y.o. male with hx of afib on coumadin, CKD, DM on Metformin, hypothyroidism, brought to the ER as he was SOB.  He was found to have afib with RVR, and CXR was clear and serology was unremarkable.  He was seen in consultation with cardiology, and Dr Harl Bowie felt that his SOB was likely due to tachyarrythmia.  He recommended using Amiodarone once more, as his QTc limited the use of other antiarrythmic drug.     HOSPITAL COURSE:  Brett [name] was admitted into the ICU, and he was diuresed, and felt less SOB.  Under guidance of cardiology, he was loaded with IV Amiodarone, and did well without any immediate side effects.  His HR showed down to about 100, and he was watched in the ICU.  His anticoagulation was continued, and he has been therapeutic for several weeks already, with help from pharmacy.  On Aug 3, he was cardioverted successfully, and was kept in the ICU subsequently for observation, and his IV Amiodarone was converted to the oral regimen.  Dr Harl Bowie did not want him to be on any AV nodal blocking agent, and his CCB and BB were both discontinued.  His HR remained on the low side at 60.  He feels well and is anxious to go home.  He will be discharged home today with appointment set up with his new PCP Dr Maudie Mercury,  per his request, and he will see Dr Harl Bowie for follow up as scheduled.  Thank you for allowing me to participate in his care again, and Good Day.   Discharge Diagnoses:  Principal Problem:   Atrial fibrillation with RVR ([name]) Active Problems:   Type 2 diabetes mellitus with diabetic nephropathy (HCC)   Hypertension   Hypothyroidism   Pulmonary hypertension (HCC)   Chronic diastolic heart failure (HCC)   HLD (hyperlipidemia)   Lumbar back sprain   Acute on chronic diastolic CHF (congestive heart failure) (HCC)   Acute congestive heart failure [facility])    Discharge Instructions  Discharge Instructions    Diet - low sodium heart healthy    Complete by:  As directed    Discharge instructions    Complete by:  As directed    Take your new medication regimen as discussed.  Follow up with Dr Harl Bowie and Dr Maudie Mercury as scheduled for you.   Increase activity slowly    Complete by:  As directed      Allergies as of 11/16/2016   No Known Allergies     Medication List    STOP taking these medications   diltiazem 360 MG 24 hr capsule Commonly known as:  CARDIZEM CD   Metoprolol Tartrate 75 MG Tabs     TAKE these medications   amiodarone 400 MG tablet Commonly known  as:  PACERONE Take 1 tablet (400 mg total) by mouth 2 (two) times daily. Take 2 tablets twice daily for one week  (400mg  BID x 7d), then 1 tablet twice daily for 2 weeks (200mg  BID x 14 d), then take 1 tablet per day going forward.   docusate sodium 100 MG capsule Commonly known as:  COLACE Take 100 mg by mouth 2 (two) times daily.   doxazosin 2 MG tablet Commonly known as:  CARDURA Take 1 tablet (2 mg total) by mouth at bedtime.   furosemide 40 MG tablet Commonly known as:  LASIX Take 1 tablet (40 mg total) by mouth daily.   levothyroxine 50 MCG tablet Commonly known as:  SYNTHROID, LEVOTHROID Take 50 mcg by mouth daily.   metFORMIN 500 MG tablet Commonly known as:  GLUCOPHAGE Take 1 tablet (500 mg total) by mouth  2 (two) times daily with a meal.   warfarin 3 MG tablet Commonly known as:  COUMADIN Take 1 tablet (3 mg total) by mouth daily at 6 PM.      Follow-up Information    [name], MD Follow up.   Specialty:  Internal Medicine Why:  August 8th at [address] information: [address] [place] [address] [phone]          No Known Allergies  Consultations:   Cardiology.   Procedures/Studies: Dg Chest 2 View  Result Date: 11/12/2016 CLINICAL DATA:  Shortness of breath, atrial fibrillation EXAM: CHEST  2 VIEW COMPARISON:  10/22/2016 FINDINGS: Stable calcified left midlung granuloma peripherally. Borderline cardiac enlargement without edema or CHF. No focal pneumonia, collapse or consolidation. No large effusion or pneumothorax. Trachea is midline. Degenerative changes of the spine. IMPRESSION: Stable chest exam without acute process. Remote granulomatous disease. Electronically Signed   By: Jerilynn Mages.  Shick M.D.   On: 11/12/2016 10:28   Dg Chest Portable 1 View  Result Date: 10/22/2016 CLINICAL DATA:  Weakness.  Atrial fibrillation. EXAM: PORTABLE CHEST 1 VIEW COMPARISON:  PA and lateral chest 08/22/2016. FINDINGS: Calcified left upper lobe granuloma is unchanged. The lungs are otherwise clear. Heart size is normal. No pneumothorax or pleural effusion. No acute bony abnormality. IMPRESSION: No acute disease. Electronically Signed   By: Inge Rise M.D.   On: 10/22/2016 11:24   Dg Ercp Biliary & Pancreatic Ducts  Result Date: 10/17/2016 CLINICAL DATA:  83 year old male with a history of choledocholithiasis EXAM: ERCP TECHNIQUE: Multiple spot images obtained with the fluoroscopic device and submitted for interpretation post-procedure. FLUOROSCOPY TIME:  Fluoroscopy Time:  2 minutes 48 seconds COMPARISON:  MR 10/17/2016, CT 10/15/2016 FINDINGS: Multiple limited intraoperative fluoroscopic spot images during ERCP. Initial image demonstrates endoscope projecting over  the upper abdomen. Surgical clips in place of prior cholecystectomy. Subsequently there is cannulation of the ampulla and retrograde infusion of contrast. Multiple rounded filling defects present within the extrahepatic biliary system. There is then deployment of a balloon basket. No stent on the final image. IMPRESSION: Limited images during ERCP demonstrates treatment for choledocholithiasis. Please refer to the dictated operative report for full details of intraoperative findings and procedure. Electronically Signed   By: Corrie Mckusick D.O.   On: 10/17/2016 13:26       Subjective:  I feel great.    Discharge Exam: Vitals:   11/15/16 2203 11/16/16 0743  BP: 132/75   Pulse:    Resp:  20  Temp:  97.7 F (36.5 C)   Vitals:   11/15/16 1900 11/15/16 2203 11/16/16 0500  11/16/16 0743  BP:  132/75    Pulse:      Resp:    20  Temp: 97.9 F (36.6 C)   97.7 F (36.5 C)  TempSrc: Oral   Oral  SpO2:      Weight:   95.8 kg (211 lb 3.2 oz)   Height:        General: Pt is alert, awake, not in acute distress Cardiovascular: RRR, S1/S2 +, no rubs, no gallops Respiratory: CTA bilaterally, no wheezing, no rhonchi Abdominal: Soft, NT, ND, bowel sounds + Extremities: no edema, no cyanosis    The results of significant diagnostics from this hospitalization (including imaging, microbiology, ancillary and laboratory) are listed below for reference.     Microbiology: No results found for this or any previous visit (from the past 240 hour(s)).   Labs: BNP (last 3 results)  Recent Labs  10/22/16 1100 11/12/16 0958  BNP 406.0* 573.2*   Basic Metabolic Panel:  Recent Labs Lab 11/12/16 0953 11/13/16 0435 11/15/16 0419  NA 136 138 134*  K 4.3 4.5 3.6  CL 102 103 97*  CO2 24 29 29   GLUCOSE 136* 134* 123*  BUN 16 15 16   CREATININE 1.38* 1.34* 1.29*  CALCIUM 9.0 8.8* 8.6*   Liver Function Tests:  Recent Labs Lab 11/12/16 1505 11/13/16 0435  AST 22 16  ALT 27 22  ALKPHOS  88 82  BILITOT 1.5* 0.9  PROT 6.8 6.3*  ALBUMIN 3.9 3.7   CBC:  Recent Labs Lab 11/12/16 0953 11/13/16 0435  WBC 6.3 5.2  NEUTROABS 4.2  --   HGB 12.7* 12.0*  HCT 38.6* 36.5*  MCV 92.8 93.4  PLT 189 158   Cardiac Enzymes:  Recent Labs Lab 11/12/16 0953  TROPONINI <0.03   BNP: Invalid input(s): POCBNP CBG:  Recent Labs Lab 11/13/16 1630 11/14/16 0754 11/14/16 1119 11/14/16 1632 11/15/16 0727  GLUCAP 106* 120* 151* 109* 117*    Anemia work up No results for input(s): VITAMINB12, FOLATE, FERRITIN, TIBC, IRON, RETICCTPCT in the last 72 hours. Urinalysis    Component Value Date/Time   COLORURINE AMBER (A) 10/15/2016 0320   APPEARANCEUR CLEAR 10/15/2016 0320   LABSPEC >1.046 (H) 10/15/2016 0320   PHURINE 5.0 10/15/2016 0320   GLUCOSEU 50 (A) 10/15/2016 0320   HGBUR MODERATE (A) 10/15/2016 0320   BILIRUBINUR NEGATIVE 10/15/2016 0320   KETONESUR 5 (A) 10/15/2016 0320   PROTEINUR NEGATIVE 10/15/2016 0320   UROBILINOGEN 0.2 12/01/2013 0740   NITRITE NEGATIVE 10/15/2016 0320   LEUKOCYTESUR NEGATIVE 10/15/2016 0320    Time coordinating discharge: Over 30 minutes SIGNED:  Orvan Falconer, MD FACP [facility] 11/16/2016, 9:02 AM   If 7PM-7AM, please contact night-coverage [email or web address] Password [email or web address]

## 2016-11-16 NOTE — Progress Notes (Signed)
[place] for coumadin Indication: atrial fibrillation  No Known Allergies  Patient Measurements: Height: 6' (182.9 cm) Weight: 211 lb 3.2 oz (95.8 kg) IBW/kg (Calculated) : 77.6  Vital Signs: Temp: 97.7 F (36.5 C) (08/04 0743) Temp Source: Oral (08/04 0743) BP: 132/75 (08/03 2203)  Labs:  Recent Labs  11/14/16 0412 11/15/16 0419 11/16/16 0448  LABPROT 28.0* 24.0* 24.2*  INR 2.56 2.11 2.13  CREATININE  --  1.29*  --    Estimated Creatinine Clearance: 53 mL/min (A) (by C-G formula based on SCr of 1.29 mg/dL (H)).   Medical History: Past Medical History:  Diagnosis Date  . Atrial fibrillation ([place])    On coumadin  . Chronic kidney disease (CKD), stage III (moderate)   . Dysrhythmia    AFib  . Essential hypertension, benign   . History of cardiac catheterization    Details not certain - reportedly no interventions  . Hypothyroidism   . Macular degeneration   . Pulmonary hypertension ([place]) 12/03/2013   PA pressure 56. Ejection fraction 55-60%  . Type 2 diabetes mellitus (HCC)     Medications:  Prescriptions Prior to Admission  Medication Sig Dispense Refill Last Dose  . diltiazem (CARDIZEM CD) 360 MG 24 hr capsule Take 1 capsule (360 mg total) by mouth daily. 30 capsule 0 11/12/2016 at Unknown time  . doxazosin (CARDURA) 2 MG tablet Take 1 tablet (2 mg total) by mouth at bedtime. 90 tablet 3 11/11/2016 at Unknown time  . furosemide (LASIX) 40 MG tablet Take 1 tablet (40 mg total) by mouth daily. 90 tablet 3   . levothyroxine (SYNTHROID, LEVOTHROID) 50 MCG tablet Take 50 mcg by mouth daily.   11/12/2016 at Unknown time  . metFORMIN (GLUCOPHAGE) 500 MG tablet Take 500 mg by mouth 2 (two) times daily with a meal.    11/12/2016 at Unknown time  . Metoprolol Tartrate 75 MG TABS Take 75 mg by mouth 2 (two) times daily. 60 tablet 0 11/12/2016 at 630  . warfarin (COUMADIN) 3 MG tablet Take 1 tablet (3 mg total) by mouth daily at 6 PM. 30  tablet 0 11/11/2016 at 2300  . docusate sodium (COLACE) 100 MG capsule Take 100 mg by mouth 2 (two) times daily.   Past Week at Unknown time   Assessment: 83 yo man to continue coumadin for afib.  INR is 2.13  Goal of Therapy:  INR 2-3 Monitor platelets by anticoagulation protocol: Yes   Plan:  Coumadin 3mg  po today x 1 Daily PT/INR  Monitor for bleeding complications  Excell Seltzer, PharmD Clinical Pharmacist 11/16/2016,8:29 AM

## 2016-11-18 ENCOUNTER — Telehealth: Payer: Self-pay | Admitting: Cardiovascular Disease

## 2016-11-18 ENCOUNTER — Ambulatory Visit (INDEPENDENT_AMBULATORY_CARE_PROVIDER_SITE_OTHER): Payer: Medicare HMO | Admitting: Gastroenterology

## 2016-11-18 ENCOUNTER — Telehealth: Payer: Self-pay

## 2016-11-18 ENCOUNTER — Encounter (HOSPITAL_COMMUNITY): Payer: Self-pay | Admitting: Cardiology

## 2016-11-18 VITALS — BP 142/68 | HR 116 | Temp 97.1°F | Ht 72.0 in | Wt 206.6 lb

## 2016-11-18 DIAGNOSIS — I4891 Unspecified atrial fibrillation: Secondary | ICD-10-CM | POA: Diagnosis not present

## 2016-11-18 DIAGNOSIS — K8050 Calculus of bile duct without cholangitis or cholecystitis without obstruction: Secondary | ICD-10-CM

## 2016-11-18 NOTE — Assessment & Plan Note (Signed)
83 year old male with history of CBD stones s/p ERCP, now with normalization of LFTs and asymptomatic from a GI standpoint. Return prn.   As separate issue: history of afib and recently inpatient. During hospitalization, HR in the 60s after successful cardioversion. Currently today in the 110 to mid 1 teen range. BP stable at 140s/60s. Asymptomatic. I have asked the nursing staff to make contact with cardiology office to inform and see if he needs to be seen sooner or have medication adjusted prior to appt upcoming.

## 2016-11-18 NOTE — Care Management (Signed)
Discharge Summary faxed to Dr. Julianne Rice office.

## 2016-11-18 NOTE — Telephone Encounter (Signed)
Patient was seen Brett [name] today and she is concerned about his  BP  Wanting to know if he needs to be seen again before he sees Dr Lovena Le on Nov 14, 2016

## 2016-11-18 NOTE — Patient Instructions (Signed)
I am glad you are doing well from our perspective!  We are calling the cardiology clinic just to give an update. They or [place] will call you with further recommendations, earlier appts, etc. Monitor for shortness of breath, chest pain, dizziness, etc.   We will see yo back as needed!

## 2016-11-18 NOTE — Telephone Encounter (Signed)
I called [facility] and a nurse was not available. [phone])  Brett [name] will have a nurse return my call about pt's pulse running 110-116 when in office today. He has appt with Dr. Cristopher Peru on 12/03/2016 at 2:30 pm. Roseanne Kaufman, NP, who saw the pt in the office today would like to know if pt should be seen by cardiology before then.

## 2016-11-18 NOTE — Progress Notes (Signed)
Referring Provider: [name], MD Primary Care Physician:  [name], MD Primary GI: Dr. Gala Romney   Chief Complaint  Patient presents with  . Cholelithiasis    HPI:   EVER GUSTAFSON is a 83 y.o. male presenting today with a history of choledocholithiasis requiring ERCP with sphincterotomy and stone extraction on 10/17/16. Recently inpatient since we saw him due to afib with RVR and mild CHF. Transaminases have normalized since last inpatient with choledocholithiasis. Gallbladder absent.   No abdominal pain, N/V. No chest pain, shortness of breath, dizziness. Does not note palpitations. States when he got home from the most recent hospitalization, his heart rate went from the 60s range to the 1 teens. He is around 110-116 in office today. Appt upcoming with cardiology 8/21.   Past Medical History:  Diagnosis Date  . Atrial fibrillation ([place])    On coumadin  . Chronic kidney disease (CKD), stage III (moderate)   . Dysrhythmia    AFib  . Essential hypertension, benign   . History of cardiac catheterization    Details not certain - reportedly no interventions  . Hypothyroidism   . Macular degeneration   . Pulmonary hypertension ([place]) 12/03/2013   PA pressure 56. Ejection fraction 55-60%  . Type 2 diabetes mellitus ([place])     Past Surgical History:  Procedure Laterality Date  . CARDIOVERSION N/A 12/10/2013   Procedure: CARDIOVERSION;  Surgeon: Herminio Commons, MD;  Location: [facility];  Service: Endoscopy;  Laterality: N/A;  . CARDIOVERSION N/A 11/15/2016   Procedure: CARDIOVERSION;  Surgeon: Arnoldo Lenis, MD;  Location: [facility];  Service: Endoscopy;  Laterality: N/A;  . CATARACT EXTRACTION W/PHACO Left 11/13/2015   Procedure: CATARACT EXTRACTION PHACO AND INTRAOCULAR LENS PLACEMENT LEFT EYE CDE=7.76;  Surgeon: Williams Che, MD;  Location: [facility];  Service: Ophthalmology;  Laterality: Left;  . CHOLECYSTECTOMY    . COLONOSCOPY     Remote > 10 years ago  . ERCP N/A  10/17/2016   Dr. Gala Romney: sphincterotomy and stone extraction   . REMOVAL OF STONES  10/17/2016   Procedure: REMOVAL OF STONES with balloon;  Surgeon: Daneil Dolin, MD;  Location: [facility];  Service: Endoscopy;;  . REPLACEMENT TOTAL HIP W/  RESURFACING IMPLANTS Bilateral   . SPHINCTEROTOMY  10/17/2016   Procedure: BILLARY SPHINCTEROTOMY with ballon dilation;  Surgeon: Daneil Dolin, MD;  Location: [facility];  Service: Endoscopy;;  . TEE WITHOUT CARDIOVERSION N/A 12/10/2013   Procedure: TRANSESOPHAGEAL ECHOCARDIOGRAM (TEE);  Surgeon: Herminio Commons, MD;  Location: [facility];  Service: Endoscopy;  Laterality: N/A;    Current Outpatient Prescriptions  Medication Sig Dispense Refill  . amiodarone (PACERONE) 400 MG tablet Take 1 tablet (400 mg total) by mouth 2 (two) times daily. Take 2 tablets twice daily for one week  (400mg  BID x 7d), then 1 tablet twice daily for 2 weeks (200mg  BID x 14 d), then take 1 tablet per day going forward. 90 tablet 1  . docusate sodium (COLACE) 100 MG capsule Take 100 mg by mouth 2 (two) times daily.    Marland Kitchen doxazosin (CARDURA) 2 MG tablet Take 1 tablet (2 mg total) by mouth at bedtime. 90 tablet 3  . furosemide (LASIX) 40 MG tablet Take 1 tablet (40 mg total) by mouth daily. 90 tablet 3  . levothyroxine (SYNTHROID, LEVOTHROID) 50 MCG tablet Take 50 mcg by mouth daily.    . metFORMIN (GLUCOPHAGE) 500 MG tablet Take 1 tablet (500  mg total) by mouth 2 (two) times daily with a meal. 180 tablet 1  . warfarin (COUMADIN) 3 MG tablet Take 1 tablet (3 mg total) by mouth daily at 6 PM. 30 tablet 0   No current facility-administered medications for this visit.     Allergies as of 11/18/2016  . (No Known Allergies)    Family History  Problem Relation Age of Onset  . Hypertension Mother   . Hypertension Father   . Colon cancer Neg Hx     Social History   Social History  . Marital status: Single    Spouse name: N/A  . Number of children: N/A  . Years of  education: N/A   Social History Main Topics  . Smoking status: Former Smoker    Packs/day: 1.00    Years: 37.00    Types: Cigarettes    Start date: 11/01/1952    Quit date: 08/18/1978  . Smokeless tobacco: Never Used  . Alcohol use 0.0 oz/week     Comment: Occasional  . Drug use: No  . Sexual activity: Not Currently   Other Topics Concern  . None   Social History Narrative  . None    Review of Systems: As mentioned in HPI   Physical Exam: BP (!) 142/68   Pulse (!) 116   Temp (!) 97.1 F (36.2 C) (Oral)   Ht 6' (1.829 m)   Wt 206 lb 9.6 oz (93.7 kg)   BMI 28.02 kg/m  General:   Alert and oriented. No distress noted. Pleasant and cooperative.  Head:  Normocephalic and atraumatic. Eyes:  Conjuctiva clear without scleral icterus. Mouth:  Oral mucosa pink and moist. Good dentition. No lesions. Heart:  S1, S2 present, irregular irregular, mildly tachycardic at 110 on exam  Abdomen:  +BS, soft, non-tender and non-distended. No rebound or guarding. No HSM or masses noted. Msk:  Symmetrical without gross deformities. Normal posture. Extremities:  Without edema. Neurologic:  Alert and  oriented x4;  grossly normal neurologically. Psych:  Alert and cooperative. Normal mood and affect.  Lab Results  Component Value Date   ALT 22 11/13/2016   AST 16 11/13/2016   ALKPHOS 82 11/13/2016   BILITOT 0.9 11/13/2016

## 2016-11-18 NOTE — Progress Notes (Signed)
cc'ed to pcp °

## 2016-11-19 ENCOUNTER — Ambulatory Visit (INDEPENDENT_AMBULATORY_CARE_PROVIDER_SITE_OTHER): Payer: Medicare HMO | Admitting: *Deleted

## 2016-11-19 DIAGNOSIS — I4891 Unspecified atrial fibrillation: Secondary | ICD-10-CM

## 2016-11-19 NOTE — Telephone Encounter (Signed)
Patient has called this morning concerned about him being back in AFIB. Stated his heart rate has been between 124-130 since being in the hospital last Saturday. He would like to know if he can see doctor today.

## 2016-11-19 NOTE — Telephone Encounter (Signed)
Opened by error.

## 2016-11-19 NOTE — Telephone Encounter (Signed)
Patient in office this evening for EKG.  No complaints at this time.  His appointment has been moved up to 11/27/2016 with Dr. Caryl Comes.    Dr. Bronson Ing reviewed EKG.  Advised to continue as is for now.    Patient made aware.

## 2016-11-19 NOTE — Progress Notes (Signed)
Patient in office for EKG.  Reviewed by Dr. Bronson Ing.  EP appt with Dr. Caryl Comes 11/27/2016.

## 2016-11-19 NOTE — Telephone Encounter (Signed)
Obtain ECG and would try to get into see EP sooner. He had a recent cardioversion and if he is back in a fib, will likely need a pacemaker as prior medication adjustments typically led to lower BP's and pauses.

## 2016-11-19 NOTE — Telephone Encounter (Signed)
I called Cardiologist to follow up on this pt. I spoke to [facility] who is working on finding out exactly what they need to do. She said they will take care of him. I called and spoke with the pt also and made him aware that I did make contact with cardiology. He is waiting on recommendations from them.

## 2016-11-19 NOTE — Telephone Encounter (Signed)
Not sure if he is back in AF - heart rate this morning 130.  Felt shaky like he used to when he felt AF.  Has checked heart rate since & has came down to 104 approximately an hour ago.  No chest pain, SOB, or dizziness.  Does already have Dr. Lovena Le new patient appointment scheduled for 12/03/2016 for new AF.  Message fwd to provider for any further suggestions in the meantime.

## 2016-11-20 DIAGNOSIS — E039 Hypothyroidism, unspecified: Secondary | ICD-10-CM | POA: Diagnosis not present

## 2016-11-20 DIAGNOSIS — E785 Hyperlipidemia, unspecified: Secondary | ICD-10-CM | POA: Diagnosis not present

## 2016-11-20 DIAGNOSIS — I482 Chronic atrial fibrillation: Secondary | ICD-10-CM | POA: Diagnosis not present

## 2016-11-20 DIAGNOSIS — I429 Cardiomyopathy, unspecified: Secondary | ICD-10-CM | POA: Diagnosis not present

## 2016-11-20 DIAGNOSIS — I10 Essential (primary) hypertension: Secondary | ICD-10-CM | POA: Diagnosis not present

## 2016-11-20 DIAGNOSIS — E119 Type 2 diabetes mellitus without complications: Secondary | ICD-10-CM | POA: Diagnosis not present

## 2016-11-20 DIAGNOSIS — N401 Enlarged prostate with lower urinary tract symptoms: Secondary | ICD-10-CM | POA: Diagnosis not present

## 2016-11-27 ENCOUNTER — Ambulatory Visit (INDEPENDENT_AMBULATORY_CARE_PROVIDER_SITE_OTHER): Payer: Medicare HMO | Admitting: Internal Medicine

## 2016-11-27 VITALS — BP 158/76 | HR 94 | Ht 72.0 in | Wt 210.0 lb

## 2016-11-27 DIAGNOSIS — I4891 Unspecified atrial fibrillation: Secondary | ICD-10-CM

## 2016-11-27 MED ORDER — METOPROLOL TARTRATE 50 MG PO TABS: 50.0000 mg | ORAL_TABLET | Freq: Two times a day (BID) | ORAL | 3 refills | Status: DC

## 2016-11-27 MED ORDER — AMIODARONE HCL 200 MG PO TABS: ORAL_TABLET | ORAL | 3 refills | Status: DC

## 2016-11-27 NOTE — Patient Instructions (Addendum)
Your physician has recommended you make the following change in your medication:  1.) change amiodarone to 400 mg twice a day for 1 week, then 200 mg twice a day for 2 weeks 2.) metoprolol tartrate 50 mg -take one tablet two times per day  We will schedule an appointment with our genetics physician.  Our scheduler will call you to arrange this appointment.  Your physician has recommended that you have a Cardioversion (DCCV). Electrical Cardioversion uses a jolt of electricity to your heart either through paddles or wired patches attached to your chest. This is a controlled, usually prescheduled, procedure. Defibrillation is done under light anesthesia in the hospital, and you usually go home the day of the procedure. This is done to get your heart back into a normal rhythm. You are not awake for the procedure. Please see the instruction sheet given to you today.  We will plan for this procedure in 3 weeks, with Dr. Wyvonna Plum.  We will contact you with details.  Please have your INR (coumadin level) checked at your PCP office next week.  Addendum:  Prescription provided for patient to take to PCP office to have INR and pre procedure labs drawn next week.

## 2016-11-27 NOTE — Progress Notes (Signed)
ELECTROPHYSIOLOGY CONSULT NOTE  Patient ID: JAXDEN BLYDEN, MRN: [id number], DOB/AGE: Jul 21, 1932 83 y.o. Admit date: (Not on file) Date of Consult: 11/27/2016  Primary Physician: [name], MD Primary Cardiologist: Laron Boorman Wiederholt is a 83 y.o. male who is being seen today for the evaluation of Afib at the request of Sko.    HPI ALCIDES NUTTING is a 83 y.o. male  Referred for treatment options because of rapid atrial fib   He has a history of coronary disease with prior PCI 2009.   He was originally diagnosed with "Afib 2005 and recalls cardioversion and amiodarone.  He is not aware as to when the atrial fib became persistent, but all notes back to 2014 refer to afib as ongoing and well rate controlled on dilt and metop tart   He  has had repeated admissions over last months with AF RVR.  The first occurred in the setting of choledocholithiasis and need for ERCP.  After discharge, rapid rates recurred and was admitted   Rate control has become a struggle partly because of low blood pressures and  pauses apparently although by reviewing notes these are described as 2-3 seconds.  Most recent hospitalization he was treated with amiodarone and cardioverted to sinus. He is intercurrently reverted to atrial fibrillation   He feels the afib as jitteriness and fatigue/weakness but not specifically with sob or LH or CP  No edema   Thromboembolic risk factors ( age -3, HTN-1, Vasc disease -1,) for a CHADSVASc Score of 4   DATE TEST    5/14    Echo   EF 60-65 % LAE 4.7 cm  8/18    Echo   EF 50-55 % LAE (not measured)            Past Medical History:  Diagnosis Date  . Atrial fibrillation ([place])    On coumadin  . Chronic kidney disease (CKD), stage III (moderate)   . Dysrhythmia    AFib  . Essential hypertension, benign   . History of cardiac catheterization    Details not certain - reportedly no interventions  . Hypothyroidism   . Macular degeneration   .  Pulmonary hypertension ([place]) 12/03/2013   PA pressure 56. Ejection fraction 55-60%  . Type 2 diabetes mellitus [facility])       Surgical History:  Past Surgical History:  Procedure Laterality Date  . CARDIOVERSION N/A 12/10/2013   Procedure: CARDIOVERSION;  Surgeon: Herminio Commons, MD;  Location: [facility];  Service: Endoscopy;  Laterality: N/A;  . CARDIOVERSION N/A 11/15/2016   Procedure: CARDIOVERSION;  Surgeon: Arnoldo Lenis, MD;  Location: [facility];  Service: Endoscopy;  Laterality: N/A;  . CATARACT EXTRACTION W/PHACO Left 11/13/2015   Procedure: CATARACT EXTRACTION PHACO AND INTRAOCULAR LENS PLACEMENT LEFT EYE CDE=7.76;  Surgeon: Williams Che, MD;  Location: [facility];  Service: Ophthalmology;  Laterality: Left;  . CHOLECYSTECTOMY    . COLONOSCOPY     Remote > 10 years ago  . ERCP N/A 10/17/2016   Dr. Gala Romney: sphincterotomy and stone extraction   . REMOVAL OF STONES  10/17/2016   Procedure: REMOVAL OF STONES with balloon;  Surgeon: Daneil Dolin, MD;  Location: [facility];  Service: Endoscopy;;  . REPLACEMENT TOTAL HIP W/  RESURFACING IMPLANTS Bilateral   . SPHINCTEROTOMY  10/17/2016   Procedure: BILLARY SPHINCTEROTOMY with ballon dilation;  Surgeon: Daneil Dolin, MD;  Location[facility];  Service:  Endoscopy;;  . TEE WITHOUT CARDIOVERSION N/A 12/10/2013   Procedure: TRANSESOPHAGEAL ECHOCARDIOGRAM (TEE);  Surgeon: Herminio Commons, MD;  Location: [facility];  Service: Endoscopy;  Laterality: N/A;     Home Meds: Prior to Admission medications   Medication Sig Start Date End Date Taking? Authorizing Provider  amiodarone (PACERONE) 400 MG tablet Take 1 tablet (400 mg total) by mouth 2 (two) times daily. Take 2 tablets twice daily for one week  (400mg  BID x 7d), then 1 tablet twice daily for 2 weeks (200mg  BID x 14 d), then take 1 tablet per day going forward. 11/16/16  Yes Orvan Falconer, MD  docusate sodium (COLACE) 100 MG capsule Take 100 mg by mouth 2 (two) times daily.   Yes [provider]  doxazosin (CARDURA) 2 MG tablet Take 1 tablet (2 mg total) by mouth at bedtime. 04/24/15  Yes Herminio Commons, MD  furosemide (LASIX) 40 MG tablet Take 1 tablet (40 mg total) by mouth daily. 11/12/16 02/10/17 Yes Herminio Commons, MD  levothyroxine (SYNTHROID, LEVOTHROID) 50 MCG tablet Take 50 mcg by mouth daily.   Yes [provider]  metFORMIN (GLUCOPHAGE) 500 MG tablet Take 1 tablet (500 mg total) by mouth 2 (two) times daily with a meal. 11/16/16  Yes Orvan Falconer, MD  warfarin (COUMADIN) 3 MG tablet Take 1 tablet (3 mg total) by mouth daily at 6 PM. 10/20/16  Yes Tat, Shanon Brow, MD    Allergies: No Known Allergies  Social History   Social History  . Marital status: Single    Spouse name: N/A  . Number of children: N/A  . Years of education: N/A   Occupational History  . Not on file.   Social History Main Topics  . Smoking status: Former Smoker    Packs/day: 1.00    Years: 37.00    Types: Cigarettes    Start date: 11/01/1952    Quit date: 08/18/1978  . Smokeless tobacco: Never Used  . Alcohol use 0.0 oz/week     Comment: Occasional  . Drug use: No  . Sexual activity: Not Currently   Other Topics Concern  . Not on file   Social History Narrative  . No narrative on file     Family History  Problem Relation Age of Onset  . Hypertension Mother   . Hypertension Father   . Colon cancer Neg Hx      ROS:  Please see the history of present illness.     All other systems reviewed and negative.    Physical Exam: Blood pressure (!) 158/76, pulse 94, height 6' (1.829 m), weight 210 lb (95.3 kg). General: Well developed, well nourished male in no acute distress. Head: Normocephalic, atraumatic, sclera non-icteric, no xanthomas, nares are without discharge. EENT: normal  Lymph Nodes:  none Neck: Negative for carotid bruits. JVD not elevated. Back:without scoliosis kyphosis Lungs: Clear bilaterally to auscultation without wheezes, rales, or rhonchi.  Breathing is unlabored. Heart: IRRR 2/6 systolic  murmur . No rubs, or gallops appreciated. Abdomen: Soft, non-tender, non-distended with normoactive bowel sounds. No hepatomegaly. No rebound/guarding. No obvious abdominal masses. Msk:  Strength and tone appear normal for age. Extremities: No clubbing or cyanosis. No edema.  Distal pedal pulses are 2+ and equal bilaterally. Skin: Warm and Dry Neuro: Alert and oriented X 3. CN III-XII intact Grossly normal sensory and motor function . Psych:  Responds to questions appropriately with a normal affect.      Labs: Cardiac Enzymes No results  for input(s): CKTOTAL, CKMB, TROPONINI in the last 72 hours. CBC Lab Results  Component Value Date   WBC 5.2 11/13/2016   HGB 12.0 (L) 11/13/2016   HCT 36.5 (L) 11/13/2016   MCV 93.4 11/13/2016   PLT 158 11/13/2016   PROTIME: No results for input(s): LABPROT, INR in the last 72 hours. Chemistry No results for input(s): NA, K, CL, CO2, BUN, CREATININE, CALCIUM, PROT, BILITOT, ALKPHOS, ALT, AST, GLUCOSE in the last 168 hours.  Invalid input(s): LABALBU Lipids Lab Results  Component Value Date   CHOL 158 10/18/2016   HDL 45 10/18/2016   LDLCALC 91 10/18/2016   TRIG 108 10/18/2016   BNP Pro B Natriuretic peptide (BNP)  Date/Time Value Ref Range Status  12/02/2013 12:12 PM 3,926.0 (H) 0 - 450 pg/mL Final   Thyroid Function Tests: No results for input(s): TSH, T4TOTAL, T3FREE, THYROIDAB in the last 72 hours.  Invalid input(s): FREET3 Miscellaneous Lab Results  Component Value Date   DDIMER 1.28 (H) 12/06/2013    Radiology/Studies:  Dg Chest 2 View  Result Date: 11/12/2016 CLINICAL DATA:  Shortness of breath, atrial fibrillation EXAM: CHEST  2 VIEW COMPARISON:  10/22/2016 FINDINGS: Stable calcified left midlung granuloma peripherally. Borderline cardiac enlargement without edema or CHF. No focal pneumonia, collapse or consolidation. No large effusion or pneumothorax. Trachea is midline.  Degenerative changes of the spine. IMPRESSION: Stable chest exam without acute process. Remote granulomatous disease. Electronically Signed   By: Jerilynn Mages.  Shick M.D.   On: 11/12/2016 10:28     EKG: Atrial fibrillation at 94 days intervals-/12/40 Intermittently prematurely aberrantly conducted beats   Assessment and Plan:  Atrial fibrillation-long-term persistent  Congestive heart failure-chronic-diastolic   The patient has long-term persistent atrial fibrillation. I am somewhat surprised the amiodarone was successful. Now that he has been started loading, I think a reasonable strategy although I am not optimistic would be to continue amiodarone loading and then attempt cardioversion again in about 3 weeks. In the interim, heart rate control will be augmented by the presence of the amiodarone. We will also add back metoprolol 50 twice a day. His diltiazem previously at 360 has been discontinued.   We will need to check INR on amiodarone  In the event that he has recurrent atrial fibrillation, AV junction ablation and pacing can be utilized.  When I don't understand his why that his heart rate has become so poorly controlled after so many years of good control.  His TSH is normla thank you very much   Hgb 12 [place]

## 2016-11-29 ENCOUNTER — Telehealth: Payer: Self-pay | Admitting: Cardiovascular Disease

## 2016-11-29 ENCOUNTER — Telehealth: Payer: Self-pay | Admitting: Internal Medicine

## 2016-11-29 ENCOUNTER — Other Ambulatory Visit: Payer: Self-pay | Admitting: Internal Medicine

## 2016-11-29 DIAGNOSIS — I4819 Other persistent atrial fibrillation: Secondary | ICD-10-CM

## 2016-11-29 NOTE — Telephone Encounter (Signed)
Spoke with Hoyle Sauer at [place] [phone] to arrange the patient's DCCV in about 3 weeks.  DCCV scheduled for Wednesday 12/18/16 with Dr. Bronson Ing at [place] (patient to arrive at 8:30 am).  Pre-op visit is set for Thursday 8/30 at 2:45 pm- the patient will need to arrive at the [place] entrance for this.   I have left a message for the patient on his identified voice mail of the above, but have asked that he call back to confirm.

## 2016-11-29 NOTE — Telephone Encounter (Signed)
Called stating that he recently had his medication changed due to being in AFIB, but wanted to ask about his heart rate being in the 50's

## 2016-11-29 NOTE — Telephone Encounter (Signed)
Unable to leave message

## 2016-11-29 NOTE — Telephone Encounter (Signed)
Follow up    Pt is calling asking for a call back from [facility]. About the appointments he was told about.

## 2016-11-29 NOTE — Telephone Encounter (Signed)
I called and spoke with the patient.  He is aware of his pre-op appt on 12/12/16 at 2:45 pm- [facility]- he is advised that he will receive all of his instructions for his procedure that day. He is also aware that his DCCV is scheduled for 12/18/16 at [facility] with Dr. Bronson Ing at [place]. He is advised to be there at 8:30am. The patient is agreeable with all of the above.   Orders have been placed by Dr. Caryl Comes.

## 2016-12-03 ENCOUNTER — Institutional Professional Consult (permissible substitution): Payer: Medicare HMO | Admitting: Internal Medicine

## 2016-12-04 DIAGNOSIS — I10 Essential (primary) hypertension: Secondary | ICD-10-CM | POA: Diagnosis not present

## 2016-12-04 DIAGNOSIS — E118 Type 2 diabetes mellitus with unspecified complications: Secondary | ICD-10-CM | POA: Diagnosis not present

## 2016-12-04 DIAGNOSIS — E119 Type 2 diabetes mellitus without complications: Secondary | ICD-10-CM | POA: Diagnosis not present

## 2016-12-04 DIAGNOSIS — E039 Hypothyroidism, unspecified: Secondary | ICD-10-CM | POA: Diagnosis not present

## 2016-12-04 DIAGNOSIS — I482 Chronic atrial fibrillation: Secondary | ICD-10-CM | POA: Diagnosis not present

## 2016-12-05 ENCOUNTER — Ambulatory Visit: Payer: Medicare HMO | Admitting: Genetic Counselor

## 2016-12-06 DIAGNOSIS — I482 Chronic atrial fibrillation: Secondary | ICD-10-CM | POA: Diagnosis not present

## 2016-12-06 NOTE — Telephone Encounter (Signed)
Noted. Will continue to monitor.

## 2016-12-06 NOTE — Telephone Encounter (Signed)
Patient aware.

## 2016-12-06 NOTE — Telephone Encounter (Signed)
Patient calling with questions about his heart rate.  Was initially in the 31's.  Now doing better, been running in the 70's.  Stated Dr. Caryl Comes made recent medication changes to his Lopressor & Amiodarone.  Feeling okay now.  Will fwd info to provider as patient is scheduled for DCCV on 12/18/2016 with Dr. Bronson Ing.

## 2016-12-09 DIAGNOSIS — H2511 Age-related nuclear cataract, right eye: Secondary | ICD-10-CM | POA: Diagnosis not present

## 2016-12-09 DIAGNOSIS — H353231 Exudative age-related macular degeneration, bilateral, with active choroidal neovascularization: Secondary | ICD-10-CM | POA: Diagnosis not present

## 2016-12-09 NOTE — Consult Note (Signed)
Zymir Napoli was referred for genetic counseling of Atrial fibrillation (AF). It was explained to him that while AF is the most common cardiac arrhtymia in clinical practice, relatively little is known about the underlying molecular events leading to arrhythmia. AF can be associated either with monogenic diseases such as hypertrophic cardiomyopathy, skeletal myopathies, familial amyloidosis as well as with ion channelopathoes such as LQTS, BrS, SQTS or can also occur as lone monogenic form.  His medical and 4-generation family history was obtained to determine the prevalence of a cardiovascular condition that may be associated with Afib in the family.  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 He states that about 2 years ago he had Afib right before he underwent surgery from removal of gall stones. Subsequently, he reports that about a month and a half ago, he had Afib after having surgery for removal of stones from his bile duct. He states that he has been in Afib since then. He believes he may have Afib before but may have not recogniised it as Afib.  Family history Jt (III.1) had a younger sister (III.2) who died at 74 of a brain aneurysm. He had three kids (IV.1-IV.3); one passed away from a heart attack at 52. He states that she was morbidly obese and suspects that it may have lead to her heart attack. The only other family member with heart disease is his apternal uncle (II.1) who passed away in his late 22s. He is not aware of the exact nature of his heart condition.   Impression and Plans  In summary, Virl does not exhibit the characteristic features of a genetic disease. His relatively late age of onset and the absence of a family history of Afib associated with a cardiovascular genetic condition, makes it highly unlikely that he has a genetic condition. In light of this, genetic testing is not warranted.                                                                                                                                                                                                                                                          Lattie Corns, Ph.D, [facility]

## 2016-12-09 NOTE — Patient Instructions (Signed)
Brett [name]  12/09/2016     @PREFPERIOPPHARMACY @   Your procedure is scheduled on  12/18/2016   Report to [name] at  830  A.M.  Call this number if you have problems the morning of surgery:  [phone]   Remember:  Do not eat food or drink liquids after midnight.  Take these medicines the morning of surgery with A SIP OF WATER  Pacerone, cardura, levothyroxine. DO NOT take any medications for diabetes the morning of your procedure.   Do not wear jewelry, make-up or nail polish.  Do not wear lotions, powders, or perfumes, or deoderant.  Do not shave 48 hours prior to surgery.  Men may shave face and neck.  Do not bring valuables to the hospital.  [facility] is not responsible for any belongings or valuables.  Contacts, dentures or bridgework may not be worn into surgery.  Leave your suitcase in the car.  After surgery it may be brought to your room.  For patients admitted to the hospital, discharge time will be determined by your treatment team.  Patients discharged the day of surgery will not be allowed to drive home.   Name and phone number of your driver:   family Special instructions:  DO NOT miss any doses of your coumadin.  Please read over the following fact sheets that you were given. Anesthesia Post-op Instructions and Care and Recovery After Surgery       Electrical Cardioversion Electrical cardioversion is the delivery of a jolt of electricity to restore a normal rhythm to the heart. A rhythm that is too fast or is not regular keeps the heart from pumping well. In this procedure, sticky patches or metal paddles are placed on the chest to deliver electricity to the heart from a device. This procedure may be done in an emergency if:  There is low or no blood pressure as a result of the heart rhythm.  Normal rhythm must be restored as fast as possible to protect the brain and heart from further damage.  It may save a life.  This  procedure may also be done for irregular or fast heart rhythms that are not immediately life-threatening. Tell a health care provider about:  Any allergies you have.  All medicines you are taking, including vitamins, herbs, eye drops, creams, and over-the-counter medicines.  Any problems you or family members have had with anesthetic medicines.  Any blood disorders you have.  Any surgeries you have had.  Any medical conditions you have.  Whether you are pregnant or may be pregnant. What are the risks? Generally, this is a safe procedure. However, problems may occur, including:  Allergic reactions to medicines.  A blood clot that breaks free and travels to other parts of your body.  The possible return of an abnormal heart rhythm within hours or days after the procedure.  Your heart stopping (cardiac arrest). This is rare.  What happens before the procedure? Medicines  Your health care provider may have you start taking: ? Blood-thinning medicines (anticoagulants) so your blood does not clot as easily. ? Medicines may be given to help stabilize your heart rate and rhythm.  Ask your health care provider about changing or stopping your regular medicines. This is especially important if you are taking diabetes medicines or blood thinners. General instructions  Plan to have someone take you home from the hospital or clinic.  If you will be going home  right after the procedure, plan to have someone with you for 24 hours.  Follow instructions from your health care provider about eating or drinking restrictions. What happens during the procedure?  To lower your risk of infection: ? Your health care team will wash or sanitize their hands. ? Your skin will be washed with soap.  An IV tube will be inserted into one of your veins.  You will be given a medicine to help you relax (sedative).  Sticky patches (electrodes) or metal paddles may be placed on your chest.  An  electrical shock will be delivered. The procedure may vary among health care providers and hospitals. What happens after the procedure?  Your blood pressure, heart rate, breathing rate, and blood oxygen level will be monitored until the medicines you were given have worn off.  Do not drive for 24 hours if you were given a sedative.  Your heart rhythm will be watched to make sure it does not change. This information is not intended to replace advice given to you by your health care provider. Make sure you discuss any questions you have with your health care provider. Document Released: 03/22/2002 Document Revised: 11/29/2015 Document Reviewed: 10/06/2015 Elsevier Interactive Patient Education  2017 [employer].  Electrical Cardioversion, Care After This sheet gives you information about how to care for yourself after your procedure. Your health care provider may also give you more specific instructions. If you have problems or questions, contact your health care provider. What can I expect after the procedure? After the procedure, it is common to have:  Some redness on the skin where the shocks were given.  Follow these instructions at home:  Do not drive for 24 hours if you were given a medicine to help you relax (sedative).  Take over-the-counter and prescription medicines only as told by your health care provider.  Ask your health care provider how to check your pulse. Check it often.  Rest for 48 hours after the procedure or as told by your health care provider.  Avoid or limit your caffeine use as told by your health care provider. Contact a health care provider if:  You feel like your heart is beating too quickly or your pulse is not regular.  You have a serious muscle cramp that does not go away. Get help right away if:  You have discomfort in your chest.  You are dizzy or you feel faint.  You have trouble breathing or you are short of breath.  Your speech is  slurred.  You have trouble moving an arm or leg on one side of your body.  Your fingers or toes turn cold or blue. This information is not intended to replace advice given to you by your health care provider. Make sure you discuss any questions you have with your health care provider. Document Released: 01/20/2013 Document Revised: 11/03/2015 Document Reviewed: 10/06/2015 Elsevier Interactive Patient Education  2018 Section Anesthesia is a term that refers to techniques, procedures, and medicines that help a person stay safe and comfortable during a medical procedure. Monitored anesthesia care, or sedation, is one type of anesthesia. Your anesthesia specialist may recommend sedation if you will be having a procedure that does not require you to be unconscious, such as:  Cataract surgery.  A dental procedure.  A biopsy.  A colonoscopy.  During the procedure, you may receive a medicine to help you relax (sedative). There are three levels of sedation:  Mild sedation.  At this level, you may feel awake and relaxed. You will be able to follow directions.  Moderate sedation. At this level, you will be sleepy. You may not remember the procedure.  Deep sedation. At this level, you will be asleep. You will not remember the procedure.  The more medicine you are given, the deeper your level of sedation will be. Depending on how you respond to the procedure, the anesthesia specialist may change your level of sedation or the type of anesthesia to fit your needs. An anesthesia specialist will monitor you closely during the procedure. Let your health care provider know about:  Any allergies you have.  All medicines you are taking, including vitamins, herbs, eye drops, creams, and over-the-counter medicines.  Any use of steroids (by mouth or as a cream).  Any problems you or family members have had with sedatives and anesthetic medicines.  Any blood disorders  you have.  Any surgeries you have had.  Any medical conditions you have, such as sleep apnea.  Whether you are pregnant or may be pregnant.  Any use of cigarettes, alcohol, or street drugs. What are the risks? Generally, this is a safe procedure. However, problems may occur, including:  Getting too much medicine (oversedation).  Nausea.  Allergic reaction to medicines.  Trouble breathing. If this happens, a breathing tube may be used to help with breathing. It will be removed when you are awake and breathing on your own.  Heart trouble.  Lung trouble.  Before the procedure Staying hydrated Follow instructions from your health care provider about hydration, which may include:  Up to 2 hours before the procedure - you may continue to drink clear liquids, such as water, clear fruit juice, black coffee, and plain tea.  Eating and drinking restrictions Follow instructions from your health care provider about eating and drinking, which may include:  8 hours before the procedure - stop eating heavy meals or foods such as meat, fried foods, or fatty foods.  6 hours before the procedure - stop eating light meals or foods, such as toast or cereal.  6 hours before the procedure - stop drinking milk or drinks that contain milk.  2 hours before the procedure - stop drinking clear liquids.  Medicines Ask your health care provider about:  Changing or stopping your regular medicines. This is especially important if you are taking diabetes medicines or blood thinners.  Taking medicines such as aspirin and ibuprofen. These medicines can thin your blood. Do not take these medicines before your procedure if your health care provider instructs you not to.  Tests and exams  You will have a physical exam.  You may have blood tests done to show: ? How well your kidneys and liver are working. ? How well your blood can clot.  General instructions  Plan to have someone take you home  from the hospital or clinic.  If you will be going home right after the procedure, plan to have someone with you for 24 hours.  What happens during the procedure?  Your blood pressure, heart rate, breathing, level of pain and overall condition will be monitored.  An IV tube will be inserted into one of your veins.  Your anesthesia specialist will give you medicines as needed to keep you comfortable during the procedure. This may mean changing the level of sedation.  The procedure will be performed. After the procedure  Your blood pressure, heart rate, breathing rate, and blood oxygen level will be monitored until  the medicines you were given have worn off.  Do not drive for 24 hours if you received a sedative.  You may: ? Feel sleepy, clumsy, or nauseous. ? Feel forgetful about what happened after the procedure. ? Have a sore throat if you had a breathing tube during the procedure. ? Vomit. This information is not intended to replace advice given to you by your health care provider. Make sure you discuss any questions you have with your health care provider. Document Released: 12/26/2004 Document Revised: 09/08/2015 Document Reviewed: 07/23/2015 Elsevier Interactive Patient Education  2018 [place], Care After These instructions provide you with information about caring for yourself after your procedure. Your health care provider may also give you more specific instructions. Your treatment has been planned according to current medical practices, but problems sometimes occur. Call your health care provider if you have any problems or questions after your procedure. What can I expect after the procedure? After your procedure, it is common to:  Feel sleepy for several hours.  Feel clumsy and have poor balance for several hours.  Feel forgetful about what happened after the procedure.  Have poor judgment for several hours.  Feel nauseous or  vomit.  Have a sore throat if you had a breathing tube during the procedure.  Follow these instructions at home: For at least 24 hours after the procedure:   Do not: ? Participate in activities in which you could fall or become injured. ? Drive. ? Use heavy machinery. ? Drink alcohol. ? Take sleeping pills or medicines that cause drowsiness. ? Make important decisions or sign legal documents. ? Take care of children on your own.  Rest. Eating and drinking  Follow the diet that is recommended by your health care provider.  If you vomit, drink water, juice, or soup when you can drink without vomiting.  Make sure you have little or no nausea before eating solid foods. General instructions  Have a responsible adult stay with you until you are awake and alert.  Take over-the-counter and prescription medicines only as told by your health care provider.  If you smoke, do not smoke without supervision.  Keep all follow-up visits as told by your health care provider. This is important. Contact a health care provider if:  You keep feeling nauseous or you keep vomiting.  You feel light-headed.  You develop a rash.  You have a fever. Get help right away if:  You have trouble breathing. This information is not intended to replace advice given to you by your health care provider. Make sure you discuss any questions you have with your health care provider. Document Released: 07/23/2015 Document Revised: 11/22/2015 Document Reviewed: 07/23/2015 Elsevier Interactive Patient Education  [employer].

## 2016-12-10 DIAGNOSIS — I482 Chronic atrial fibrillation: Secondary | ICD-10-CM | POA: Diagnosis not present

## 2016-12-11 DIAGNOSIS — R001 Bradycardia, unspecified: Secondary | ICD-10-CM | POA: Diagnosis not present

## 2016-12-11 DIAGNOSIS — E119 Type 2 diabetes mellitus without complications: Secondary | ICD-10-CM | POA: Diagnosis not present

## 2016-12-11 DIAGNOSIS — I482 Chronic atrial fibrillation: Secondary | ICD-10-CM | POA: Diagnosis not present

## 2016-12-12 ENCOUNTER — Encounter (HOSPITAL_COMMUNITY): Payer: Self-pay

## 2016-12-12 ENCOUNTER — Telehealth: Payer: Self-pay

## 2016-12-12 ENCOUNTER — Encounter (HOSPITAL_COMMUNITY)
Admission: RE | Admit: 2016-12-12 | Discharge: 2016-12-12 | Disposition: A | Discharge status: Home / Self Care | Payer: Medicare HMO | Source: Ambulatory Visit | Attending: Cardiovascular Disease | Admitting: Cardiovascular Disease

## 2016-12-12 DIAGNOSIS — Z01812 Encounter for preprocedural laboratory examination: Secondary | ICD-10-CM | POA: Diagnosis present

## 2016-12-12 LAB — BASIC METABOLIC PANEL WITH GFR
Anion gap: 8 (ref 5–15)
BUN: 24 mg/dL — ABNORMAL HIGH (ref 6–20)
CO2: 26 mmol/L (ref 22–32)
Calcium: 8.7 mg/dL — ABNORMAL LOW (ref 8.9–10.3)
Chloride: 100 mmol/L — ABNORMAL LOW (ref 101–111)
Creatinine, Ser: 1.61 mg/dL — ABNORMAL HIGH (ref 0.61–1.24)
GFR calc Af Amer: 44 mL/min — ABNORMAL LOW
GFR calc non Af Amer: 38 mL/min — ABNORMAL LOW
Glucose, Bld: 129 mg/dL — ABNORMAL HIGH (ref 65–99)
Potassium: 4.1 mmol/L (ref 3.5–5.1)
Sodium: 134 mmol/L — ABNORMAL LOW (ref 135–145)

## 2016-12-12 LAB — PROTIME-INR
INR: 3
PROTHROMBIN TIME: 30.9 s — AB (ref 11.4–15.2)

## 2016-12-12 NOTE — Telephone Encounter (Signed)
[place] pt, will forward

## 2016-12-12 NOTE — Telephone Encounter (Signed)
-----   Message from Herminio Commons, MD sent at 12/12/2016  3:34 PM EDT ----- BUN and creatinine are elevated. How much Lasix is he taking?

## 2016-12-13 NOTE — Telephone Encounter (Signed)
Currently taking Lasix 40mg  as needed.  Did take this morning due to having some swelling in ankles & feet.  Is wearing compression hose. No weight gain.  No chest pain or dizziness.  SOB is about the same.

## 2016-12-16 NOTE — Telephone Encounter (Signed)
Repeat in a month.

## 2016-12-17 NOTE — Telephone Encounter (Signed)
Pt made aware - routed to Dr Bronson Ing nurse

## 2016-12-17 NOTE — Telephone Encounter (Signed)
Left message to return call 

## 2016-12-18 ENCOUNTER — Ambulatory Visit (HOSPITAL_COMMUNITY): Payer: Medicare HMO | Admitting: Anesthesiology

## 2016-12-18 ENCOUNTER — Encounter (HOSPITAL_COMMUNITY)
Admission: RE | Disposition: A | Discharge status: Home / Self Care | Payer: Self-pay | Source: Ambulatory Visit | Attending: Cardiology

## 2016-12-18 ENCOUNTER — Ambulatory Visit (HOSPITAL_COMMUNITY)
Admission: RE | Admit: 2016-12-18 | Discharge: 2016-12-18 | Disposition: A | Discharge status: Home / Self Care | Payer: Medicare HMO | Source: Ambulatory Visit | Attending: Cardiology | Admitting: Cardiology

## 2016-12-18 DIAGNOSIS — E039 Hypothyroidism, unspecified: Secondary | ICD-10-CM | POA: Insufficient documentation

## 2016-12-18 DIAGNOSIS — R0602 Shortness of breath: Secondary | ICD-10-CM | POA: Diagnosis not present

## 2016-12-18 DIAGNOSIS — I509 Heart failure, unspecified: Secondary | ICD-10-CM | POA: Insufficient documentation

## 2016-12-18 DIAGNOSIS — H3530 Unspecified macular degeneration: Secondary | ICD-10-CM

## 2016-12-18 DIAGNOSIS — Z87891 Personal history of nicotine dependence: Secondary | ICD-10-CM | POA: Insufficient documentation

## 2016-12-18 DIAGNOSIS — E1121 Type 2 diabetes mellitus with diabetic nephropathy: Secondary | ICD-10-CM | POA: Diagnosis not present

## 2016-12-18 DIAGNOSIS — Z96643 Presence of artificial hip joint, bilateral: Secondary | ICD-10-CM

## 2016-12-18 DIAGNOSIS — I130 Hypertensive heart and chronic kidney disease with heart failure and stage 1 through stage 4 chronic kidney disease, or unspecified chronic kidney disease: Secondary | ICD-10-CM | POA: Insufficient documentation

## 2016-12-18 DIAGNOSIS — N183 Chronic kidney disease, stage 3 (moderate): Secondary | ICD-10-CM | POA: Insufficient documentation

## 2016-12-18 DIAGNOSIS — R05 Cough: Secondary | ICD-10-CM | POA: Diagnosis not present

## 2016-12-18 DIAGNOSIS — I481 Persistent atrial fibrillation: Secondary | ICD-10-CM

## 2016-12-18 DIAGNOSIS — Z79899 Other long term (current) drug therapy: Secondary | ICD-10-CM | POA: Insufficient documentation

## 2016-12-18 DIAGNOSIS — I272 Pulmonary hypertension, unspecified: Secondary | ICD-10-CM | POA: Insufficient documentation

## 2016-12-18 DIAGNOSIS — M1990 Unspecified osteoarthritis, unspecified site: Secondary | ICD-10-CM

## 2016-12-18 DIAGNOSIS — I4891 Unspecified atrial fibrillation: Secondary | ICD-10-CM | POA: Diagnosis not present

## 2016-12-18 DIAGNOSIS — I4819 Other persistent atrial fibrillation: Secondary | ICD-10-CM

## 2016-12-18 DIAGNOSIS — Z7901 Long term (current) use of anticoagulants: Secondary | ICD-10-CM | POA: Insufficient documentation

## 2016-12-18 DIAGNOSIS — Z7984 Long term (current) use of oral hypoglycemic drugs: Secondary | ICD-10-CM | POA: Insufficient documentation

## 2016-12-18 DIAGNOSIS — D688 Other specified coagulation defects: Secondary | ICD-10-CM | POA: Diagnosis not present

## 2016-12-18 DIAGNOSIS — R000 Tachycardia, unspecified: Secondary | ICD-10-CM

## 2016-12-18 DIAGNOSIS — E1122 Type 2 diabetes mellitus with diabetic chronic kidney disease: Secondary | ICD-10-CM | POA: Insufficient documentation

## 2016-12-18 DIAGNOSIS — I5033 Acute on chronic diastolic (congestive) heart failure: Secondary | ICD-10-CM | POA: Diagnosis not present

## 2016-12-18 DIAGNOSIS — T462X5A Adverse effect of other antidysrhythmic drugs, initial encounter: Secondary | ICD-10-CM | POA: Diagnosis not present

## 2016-12-18 DIAGNOSIS — D689 Coagulation defect, unspecified: Secondary | ICD-10-CM | POA: Diagnosis not present

## 2016-12-18 HISTORY — PX: CARDIOVERSION: SHX1299

## 2016-12-18 LAB — GLUCOSE, CAPILLARY: Glucose-Capillary: 130 mg/dL — ABNORMAL HIGH (ref 65–99)

## 2016-12-18 SURGERY — CARDIOVERSION
Anesthesia: Monitor Anesthesia Care

## 2016-12-18 MED ORDER — PROPOFOL 10 MG/ML IV BOLUS
INTRAVENOUS | Status: DC | PRN
  Administered 2016-12-18 (×4): 10 mg via INTRAVENOUS

## 2016-12-18 MED ORDER — PROPOFOL 500 MG/50ML IV EMUL
INTRAVENOUS | Status: DC | PRN
  Administered 2016-12-18: 50 ug/kg/min via INTRAVENOUS

## 2016-12-18 MED ORDER — PROPOFOL 10 MG/ML IV BOLUS
INTRAVENOUS | Status: AC
  Filled 2016-12-18: qty 20

## 2016-12-18 MED ORDER — LACTATED RINGERS IV SOLN
INTRAVENOUS | Status: DC
  Administered 2016-12-18: 1000 mL via INTRAVENOUS

## 2016-12-18 MED ORDER — SODIUM CHLORIDE 0.9 % IV SOLN: INTRAVENOUS | Status: DC

## 2016-12-18 NOTE — Anesthesia Preprocedure Evaluation (Signed)
Anesthesia Evaluation  Patient identified by MRN, date of birth, ID band Patient awake    Reviewed: Allergy & Precautions, H&P , NPO status , Patient's Chart, lab work & pertinent test results, reviewed documented beta blocker date and time   Airway Mallampati: III  TM Distance: >3 FB Neck ROM: Full    Dental  (+) Edentulous Upper, Poor Dentition   Pulmonary shortness of breath and at rest, former smoker,   Mild wheezing  breath sounds clear to auscultation (-) wheezing      Cardiovascular hypertension, Pt. on home beta blockers and Pt. on medications +CHF and + DOE  + dysrhythmias Atrial Fibrillation  Rhythm:Irregular Rate:Tachycardia     Neuro/Psych    GI/Hepatic neg GERD  ,  Endo/Other  diabetes, Well Controlled, Type 2, Oral Hypoglycemic AgentsHypothyroidism   Renal/GU Renal InsufficiencyRenal disease     Musculoskeletal  (+) Arthritis , Bilateral hip replacement   Abdominal   Peds  Hematology   Anesthesia Other Findings   Reproductive/Obstetrics                             Anesthesia Physical Anesthesia Plan  ASA: III  Anesthesia Plan: MAC   Post-op Pain Management:    Induction: Intravenous  PONV Risk Score and Plan:   Airway Management Planned: Simple Face Mask  Additional Equipment:   Intra-op Plan:   Post-operative Plan:   Informed Consent: I have reviewed the patients History and Physical, chart, labs and discussed the procedure including the risks, benefits and alternatives for the proposed anesthesia with the patient or authorized representative who has indicated his/her understanding and acceptance.     Plan Discussed with:   Anesthesia Plan Comments:         Anesthesia Quick Evaluation

## 2016-12-18 NOTE — Anesthesia Postprocedure Evaluation (Signed)
Anesthesia Post Note  Patient: Brett [name]  Procedure(s) Performed: Procedure(s) (LRB): CARDIOVERSION (N/A)  Patient location during evaluation: Short Stay Anesthesia Type: MAC Level of consciousness: awake and alert Pain management: satisfactory to patient Vital Signs Assessment: post-procedure vital signs reviewed and stable Respiratory status: spontaneous breathing Cardiovascular status: stable Postop Assessment: no signs of nausea or vomiting and adequate PO intake Anesthetic complications: no     Last Vitals:  Vitals:   12/18/16 0951 12/18/16 1000  BP: (!) 114/58 122/64  Resp: 20 (!) 116  Temp: 36.6 C   SpO2: 100%     Last Pain: There were no vitals filed for this visit.               Drucie Opitz

## 2016-12-18 NOTE — H&P (Signed)
Patient presents for elective cardioversion. Please see referenced clinic note from Dr Caryl Comes referenced below for full history. He is on coumadin, INRs reviewed and have been therapeutic over 1 month.    Brett Dolly MD     Brett [name] is a 83 y.o. male who is being seen today for the evaluation of Afib at the request of Sko.    HPI Brett [name] is a 83 y.o. male  Referred for treatment options because of rapid atrial fib   He has a history of coronary disease with prior PCI 2009.   He was originally diagnosed with "Afib 2005 and recalls cardioversion and amiodarone.  He is not aware as to when the atrial fib became persistent, but all notes back to 2014 refer to afib as ongoing and well rate controlled on dilt and metop tart   He  has had repeated admissions over last months with AF RVR.  The first occurred in the setting of choledocholithiasis and need for ERCP.  After discharge, rapid rates recurred and was admitted   Rate control has become a struggle partly because of low blood pressures and  pauses apparently although by reviewing notes these are described as 2-3 seconds.  Most recent hospitalization he was treated with amiodarone and cardioverted to sinus. He is intercurrently reverted to atrial fibrillation   He feels the afib as jitteriness and fatigue/weakness but not specifically with sob or LH or CP  No edema   Thromboembolic risk factors ( age -24, HTN-1, Vasc disease -1,) for a CHADSVASc Score of 4   DATE TEST    5/14    Echo   EF 60-65 % LAE 4.7 cm  8/18    Echo   EF 50-55 % LAE (not measured)                Past Medical History:  Diagnosis Date  . Atrial fibrillation ([place])    On coumadin  . Chronic kidney disease (CKD), stage III (moderate)   . Dysrhythmia    AFib  . Essential hypertension, benign   . History of cardiac catheterization    Details not certain - reportedly no interventions  . Hypothyroidism   .  Macular degeneration   . Pulmonary hypertension ([place]) 12/03/2013   PA pressure 56. Ejection fraction 55-60%  . Type 2 diabetes mellitus [facility])       Surgical History:       Past Surgical History:  Procedure Laterality Date  . CARDIOVERSION N/A 12/10/2013   Procedure: CARDIOVERSION;  Surgeon: Herminio Commons, MD;  Location: [facility] OR[facility];  Service: Endoscopy;  Laterality: N/A;  . CARDIOVERSION N/A 11/15/2016   Procedure: CARDIOVERSION;  Surgeon: Arnoldo Lenis, MD;  Location: [facility];  Service: Endoscopy;  Laterality: N/A;  . CATARACT EXTRACTION W/PHACO Left 11/13/2015   Procedure: CATARACT EXTRACTION PHACO AND INTRAOCULAR LENS PLACEMENT LEFT EYE CDE=7.76;  Surgeon: Williams Che, MD;  Location: [facility];  Service: Ophthalmology;  Laterality: Left;  . CHOLECYSTECTOMY    . COLONOSCOPY     Remote > 10 years ago  . ERCP N/A 10/17/2016   Dr. Gala Romney: sphincterotomy and stone extraction   . REMOVAL OF STONES  10/17/2016   Procedure: REMOVAL OF STONES with balloon;  Surgeon: Daneil Dolin, MD;  Location: [facility];  Service: Endoscopy;;  . REPLACEMENT TOTAL HIP W/  RESURFACING IMPLANTS Bilateral   . SPHINCTEROTOMY  10/17/2016   Procedure: BILLARY SPHINCTEROTOMY with ballon dilation;  Surgeon: Manus Rudd  M, MD;  Location: [facility];  Service: Endoscopy;;  . TEE WITHOUT CARDIOVERSION N/A 12/10/2013   Procedure: TRANSESOPHAGEAL ECHOCARDIOGRAM (TEE);  Surgeon: Herminio Commons, MD;  Location: [facility];  Service: Endoscopy;  Laterality: N/A;     Home Meds:        Prior to Admission medications   Medication Sig Start Date End Date Taking? Authorizing Provider  amiodarone (PACERONE) 400 MG tablet Take 1 tablet (400 mg total) by mouth 2 (two) times daily. Take 2 tablets twice daily for one week  (400mg  BID x 7d), then 1 tablet twice daily for 2 weeks (200mg  BID x 14 d), then take 1 tablet per day going forward. 11/16/16  Yes Orvan Falconer, MD  docusate sodium (COLACE) 100 MG  capsule Take 100 mg by mouth 2 (two) times daily.   Yes [provider]  doxazosin (CARDURA) 2 MG tablet Take 1 tablet (2 mg total) by mouth at bedtime. 04/24/15  Yes Herminio Commons, MD  furosemide (LASIX) 40 MG tablet Take 1 tablet (40 mg total) by mouth daily. 11/12/16 02/10/17 Yes Herminio Commons, MD  levothyroxine (SYNTHROID, LEVOTHROID) 50 MCG tablet Take 50 mcg by mouth daily.   Yes [provider]  metFORMIN (GLUCOPHAGE) 500 MG tablet Take 1 tablet (500 mg total) by mouth 2 (two) times daily with a meal. 11/16/16  Yes Orvan Falconer, MD  warfarin (COUMADIN) 3 MG tablet Take 1 tablet (3 mg total) by mouth daily at 6 PM. 10/20/16  Yes Tat, Shanon Brow, MD    Allergies: No Known Allergies  Social History        Social History  . Marital status: Single    Spouse name: N/A  . Number of children: N/A  . Years of education: N/A      Occupational History  . Not on file.         Social History Main Topics  . Smoking status: Former Smoker    Packs/day: 1.00    Years: 37.00    Types: Cigarettes    Start date: 11/01/1952    Quit date: 08/18/1978  . Smokeless tobacco: Never Used  . Alcohol use 0.0 oz/week     Comment: Occasional  . Drug use: No  . Sexual activity: Not Currently       Other Topics Concern  . Not on file   Social History Narrative  . No narrative on file          Family History  Problem Relation Age of Onset  . Hypertension Mother   . Hypertension Father   . Colon cancer Neg Hx      ROS:  Please see the history of present illness.     All other systems reviewed and negative.    Physical Exam: Blood pressure (!) 158/76, pulse 94, height 6' (1.829 m), weight 210 lb (95.3 kg). General: Well developed, well nourished male in no acute distress. Head: Normocephalic, atraumatic, sclera non-icteric, no xanthomas, nares are without discharge. EENT: normal  Lymph Nodes:  none Neck: Negative for carotid bruits.  JVD not elevated. Back:without scoliosis kyphosis Lungs: Clear bilaterally to auscultation without wheezes, rales, or rhonchi. Breathing is unlabored. Heart: IRRR 2/6 systolic  murmur . No rubs, or gallops appreciated. Abdomen: Soft, non-tender, non-distended with normoactive bowel sounds. No hepatomegaly. No rebound/guarding. No obvious abdominal masses. Msk:  Strength and tone appear normal for age. Extremities: No clubbing or cyanosis. No edema.  Distal pedal pulses are 2+ and equal bilaterally. Skin:  Warm and Dry Neuro: Alert and oriented X 3. CN III-XII intact Grossly normal sensory and motor function . Psych:  Responds to questions appropriately with a normal affect.                 Labs: Cardiac Enzymes RecentLabs(last2labs)  No results for input(s): CKTOTAL, CKMB, TROPONINI in the last 72 hours.   CBC RecentLabs       Lab Results  Component Value Date   WBC 5.2 11/13/2016   HGB 12.0 (L) 11/13/2016   HCT 36.5 (L) 11/13/2016   MCV 93.4 11/13/2016   PLT 158 11/13/2016     PROTIME: RecentLabs(last2labs)  No results for input(s): LABPROT, INR in the last 72 hours.   Chemistry  LastLabs  No results for input(s): NA, K, CL, CO2, BUN, CREATININE, CALCIUM, PROT, BILITOT, ALKPHOS, ALT, AST, GLUCOSE in the last 168 hours.  Invalid input(s): LABALBU   Lipids RecentLabs       Lab Results  Component Value Date   CHOL 158 10/18/2016   HDL 45 10/18/2016   LDLCALC 91 10/18/2016   TRIG 108 10/18/2016     BNP LastLabs       Pro B Natriuretic peptide (BNP)  Date/Time Value Ref Range Status  12/02/2013 12:12 PM 3,926.0 (H) 0 - 450 pg/mL Final     Thyroid Function Tests:  RecentLabs(last2labs)  No results for input(s): TSH, T4TOTAL, T3FREE, THYROIDAB in the last 72 hours.  Invalid input(s): FREET3   Miscellaneous RecentLabs       Lab Results  Component Value Date   DDIMER 1.28 (H) 12/06/2013      Radiology/Studies:    ImagingResults  Dg Chest 2 View  Result Date: 11/12/2016 CLINICAL DATA:  Shortness of breath, atrial fibrillation EXAM: CHEST  2 VIEW COMPARISON:  10/22/2016 FINDINGS: Stable calcified left midlung granuloma peripherally. Borderline cardiac enlargement without edema or CHF. No focal pneumonia, collapse or consolidation. No large effusion or pneumothorax. Trachea is midline. Degenerative changes of the spine. IMPRESSION: Stable chest exam without acute process. Remote granulomatous disease. Electronically Signed   By: Jerilynn Mages.  Shick M.D.   On: 11/12/2016 10:28      EKG: Atrial fibrillation at 94 days intervals-/12/40 Intermittently prematurely aberrantly conducted beats   Assessment and Plan:  Atrial fibrillation-long-term persistent  Congestive heart failure-chronic-diastolic   The patient has long-term persistent atrial fibrillation. I am somewhat surprised the amiodarone was successful. Now that he has been started loading, I think a reasonable strategy although I am not optimistic would be to continue amiodarone loading and then attempt cardioversion again in about 3 weeks. In the interim, heart rate control will be augmented by the presence of the amiodarone. We will also add back metoprolol 50 twice a day. His diltiazem previously at 360 has been discontinued.   We will need to check INR on amiodarone  In the event that he has recurrent atrial fibrillation, AV junction ablation and pacing can be utilized.  When I don't understand his why that his heart rate has become so poorly controlled after so many years of good control.  His TSH is normla thank you very much   Hgb [address]

## 2016-12-18 NOTE — Transfer of Care (Signed)
Immediate Anesthesia Transfer of Care Note  Patient: Brett [name]  Procedure(s) Performed: Procedure(s) with comments: CARDIOVERSION (N/A) - pt knows to arrive at 8:00  Patient Location: PACU  Anesthesia Type:MAC  Level of Consciousness: awake and patient cooperative  Airway & Oxygen Therapy: Patient Spontanous Breathing and Patient connected to nasal cannula oxygen  Post-op Assessment: Report given to RN and Post -op Vital signs reviewed and stable  Post vital signs: Reviewed and stable  Last Vitals:  Vitals:   12/18/16 0920 12/18/16 0925  BP: (!) 147/85 (!) 155/82  Resp: (!) 41 (!) 34  Temp:    SpO2: 98% 99%    Last Pain: There were no vitals filed for this visit.       Complications: No apparent anesthesia complications

## 2016-12-18 NOTE — Anesthesia Procedure Notes (Signed)
Procedure Name: MAC Date/Time: 12/18/2016 9:30 AM Performed by: Vista Deck Pre-anesthesia Checklist: Patient identified, Emergency Drugs available, Suction available, Timeout performed and Patient being monitored Patient Re-evaluated:Patient Re-evaluated prior to induction Oxygen Delivery Method: Non-rebreather mask

## 2016-12-18 NOTE — Discharge Instructions (Signed)

## 2016-12-18 NOTE — CV Procedure (Signed)
Procedure: elective electrical cardioversion Physician: Dr [name] Indication: Afib  Patient was brought to the procedure suite after appropriate consent was obtained. Defib pads were placed in the anterior and posterior positions. Sedation was achieved with the assistance of anesthesiology, for details please refer to their note. He was succesfully converted to sinus rhythm with a single synchronized 200j shock. Cardiopulmonary monitoring was performed throughout the procedure, he tolerated well without complications.   [name] MD

## 2016-12-19 ENCOUNTER — Encounter (HOSPITAL_COMMUNITY): Payer: Self-pay | Admitting: Cardiology

## 2016-12-19 ENCOUNTER — Telehealth: Payer: Self-pay | Admitting: *Deleted

## 2016-12-19 NOTE — Telephone Encounter (Signed)
Pt c/o increasing SOB starting yesterday afternoon, had cardioversion yesterday - denies chest pain/dizziness or any other symptoms - doesn't want to go to ED but would like recs from Dr Tommie Ard

## 2016-12-19 NOTE — Telephone Encounter (Signed)
Would need to be evaluated. Have him seen by an APP either in [place] or [place].

## 2016-12-19 NOTE — Telephone Encounter (Signed)
Pt voiced understanding says he does have someone at home that could take him, however says SOB is getting better and didn't know if he would go or not. Explained that this was Dr Court Joy recs - pt voiced understanding and says "I appreciate that but I will make that call if I go or not"

## 2016-12-20 ENCOUNTER — Encounter (HOSPITAL_COMMUNITY): Payer: Self-pay

## 2016-12-20 ENCOUNTER — Inpatient Hospital Stay (HOSPITAL_COMMUNITY)
Admission: EM | Admit: 2016-12-20 | Discharge: 2016-12-22 | DRG: 291 | Disposition: A | Discharge status: Home / Self Care | Payer: Medicare HMO | Attending: Internal Medicine | Admitting: Internal Medicine

## 2016-12-20 ENCOUNTER — Emergency Department (HOSPITAL_COMMUNITY): Payer: Medicare HMO

## 2016-12-20 DIAGNOSIS — Z96643 Presence of artificial hip joint, bilateral: Secondary | ICD-10-CM | POA: Diagnosis present

## 2016-12-20 DIAGNOSIS — E1122 Type 2 diabetes mellitus with diabetic chronic kidney disease: Secondary | ICD-10-CM | POA: Diagnosis present

## 2016-12-20 DIAGNOSIS — N183 Chronic kidney disease, stage 3 unspecified: Secondary | ICD-10-CM | POA: Diagnosis present

## 2016-12-20 DIAGNOSIS — T462X5A Adverse effect of other antidysrhythmic drugs, initial encounter: Secondary | ICD-10-CM | POA: Diagnosis present

## 2016-12-20 DIAGNOSIS — Z79899 Other long term (current) drug therapy: Secondary | ICD-10-CM | POA: Diagnosis not present

## 2016-12-20 DIAGNOSIS — R0602 Shortness of breath: Secondary | ICD-10-CM | POA: Diagnosis not present

## 2016-12-20 DIAGNOSIS — D689 Coagulation defect, unspecified: Secondary | ICD-10-CM | POA: Diagnosis not present

## 2016-12-20 DIAGNOSIS — Z7901 Long term (current) use of anticoagulants: Secondary | ICD-10-CM | POA: Diagnosis not present

## 2016-12-20 DIAGNOSIS — I4891 Unspecified atrial fibrillation: Secondary | ICD-10-CM | POA: Diagnosis present

## 2016-12-20 DIAGNOSIS — I130 Hypertensive heart and chronic kidney disease with heart failure and stage 1 through stage 4 chronic kidney disease, or unspecified chronic kidney disease: Principal | ICD-10-CM | POA: Diagnosis present

## 2016-12-20 DIAGNOSIS — M1990 Unspecified osteoarthritis, unspecified site: Secondary | ICD-10-CM | POA: Diagnosis present

## 2016-12-20 DIAGNOSIS — E119 Type 2 diabetes mellitus without complications: Secondary | ICD-10-CM

## 2016-12-20 DIAGNOSIS — I481 Persistent atrial fibrillation: Secondary | ICD-10-CM | POA: Diagnosis present

## 2016-12-20 DIAGNOSIS — H3530 Unspecified macular degeneration: Secondary | ICD-10-CM | POA: Diagnosis present

## 2016-12-20 DIAGNOSIS — Z8249 Family history of ischemic heart disease and other diseases of the circulatory system: Secondary | ICD-10-CM | POA: Diagnosis not present

## 2016-12-20 DIAGNOSIS — Z87891 Personal history of nicotine dependence: Secondary | ICD-10-CM | POA: Diagnosis not present

## 2016-12-20 DIAGNOSIS — D688 Other specified coagulation defects: Secondary | ICD-10-CM | POA: Diagnosis present

## 2016-12-20 DIAGNOSIS — I480 Paroxysmal atrial fibrillation: Secondary | ICD-10-CM | POA: Diagnosis present

## 2016-12-20 DIAGNOSIS — Z6827 Body mass index (BMI) 27.0-27.9, adult: Secondary | ICD-10-CM

## 2016-12-20 DIAGNOSIS — I272 Pulmonary hypertension, unspecified: Secondary | ICD-10-CM | POA: Diagnosis not present

## 2016-12-20 DIAGNOSIS — I5033 Acute on chronic diastolic (congestive) heart failure: Secondary | ICD-10-CM | POA: Diagnosis not present

## 2016-12-20 DIAGNOSIS — I509 Heart failure, unspecified: Secondary | ICD-10-CM | POA: Insufficient documentation

## 2016-12-20 DIAGNOSIS — Z7984 Long term (current) use of oral hypoglycemic drugs: Secondary | ICD-10-CM | POA: Diagnosis not present

## 2016-12-20 DIAGNOSIS — R05 Cough: Secondary | ICD-10-CM | POA: Diagnosis not present

## 2016-12-20 DIAGNOSIS — E1121 Type 2 diabetes mellitus with diabetic nephropathy: Secondary | ICD-10-CM | POA: Diagnosis present

## 2016-12-20 DIAGNOSIS — E039 Hypothyroidism, unspecified: Secondary | ICD-10-CM | POA: Diagnosis present

## 2016-12-20 LAB — CBC WITH DIFFERENTIAL/PLATELET
BASOS ABS: 0 10*3/uL (ref 0.0–0.1)
BASOS PCT: 0 %
EOS ABS: 0 10*3/uL (ref 0.0–0.7)
EOS PCT: 0 %
HCT: 36.7 % — ABNORMAL LOW (ref 39.0–52.0)
Hemoglobin: 12.2 g/dL — ABNORMAL LOW (ref 13.0–17.0)
LYMPHS PCT: 13 %
Lymphs Abs: 1.1 10*3/uL (ref 0.7–4.0)
MCH: 30 pg (ref 26.0–34.0)
MCHC: 33.2 g/dL (ref 30.0–36.0)
MCV: 90.2 fL (ref 78.0–100.0)
Monocytes Absolute: 0.8 10*3/uL (ref 0.1–1.0)
Monocytes Relative: 10 %
NEUTROS PCT: 77 %
Neutro Abs: 6.3 10*3/uL (ref 1.7–7.7)
PLATELETS: 153 10*3/uL (ref 150–400)
RBC: 4.07 MIL/uL — AB (ref 4.22–5.81)
RDW: 14 % (ref 11.5–15.5)
WBC: 8.2 10*3/uL (ref 4.0–10.5)

## 2016-12-20 LAB — BASIC METABOLIC PANEL
Anion gap: 9 (ref 5–15)
BUN: 18 mg/dL (ref 6–20)
CO2: 25 mmol/L (ref 22–32)
Calcium: 8.7 mg/dL — ABNORMAL LOW (ref 8.9–10.3)
Chloride: 101 mmol/L (ref 101–111)
Creatinine, Ser: 1.2 mg/dL (ref 0.61–1.24)
GFR, EST NON AFRICAN AMERICAN: 54 mL/min — AB (ref >60)
Glucose, Bld: 168 mg/dL — ABNORMAL HIGH (ref 65–99)
POTASSIUM: 4.1 mmol/L (ref 3.5–5.1)
SODIUM: 135 mmol/L (ref 135–145)

## 2016-12-20 LAB — GLUCOSE, CAPILLARY
GLUCOSE-CAPILLARY: 134 mg/dL — AB (ref 65–99)
GLUCOSE-CAPILLARY: 95 mg/dL (ref 65–99)
Glucose-Capillary: 117 mg/dL — ABNORMAL HIGH (ref 65–99)
Glucose-Capillary: 122 mg/dL — ABNORMAL HIGH (ref 65–99)

## 2016-12-20 LAB — PROTIME-INR
INR: 7.68 — AB
PROTHROMBIN TIME: 64.4 s — AB (ref 11.4–15.2)

## 2016-12-20 LAB — BRAIN NATRIURETIC PEPTIDE: B NATRIURETIC PEPTIDE 5: 808 pg/mL — AB (ref 0.0–100.0)

## 2016-12-20 LAB — TROPONIN I: Troponin I: 0.03 ng/mL (ref <0.03)

## 2016-12-20 MED ORDER — INSULIN ASPART 100 UNIT/ML ~~LOC~~ SOLN
0.0000 [IU] | Freq: Three times a day (TID) | SUBCUTANEOUS | Status: DC
  Administered 2016-12-20: 1 [IU] via SUBCUTANEOUS
  Administered 2016-12-21: 2 [IU] via SUBCUTANEOUS
  Administered 2016-12-21 – 2016-12-22 (×2): 1 [IU] via SUBCUTANEOUS

## 2016-12-20 MED ORDER — FUROSEMIDE 10 MG/ML IJ SOLN
40.0000 mg | Freq: Once | INTRAMUSCULAR | Status: AC
  Administered 2016-12-20: 40 mg via INTRAVENOUS

## 2016-12-20 MED ORDER — ACETAMINOPHEN 325 MG PO TABS: 650.0000 mg | ORAL_TABLET | ORAL | Status: DC | PRN

## 2016-12-20 MED ORDER — METOPROLOL TARTRATE 50 MG PO TABS
50.0000 mg | ORAL_TABLET | Freq: Two times a day (BID) | ORAL | Status: DC
  Administered 2016-12-20 – 2016-12-22 (×5): 50 mg via ORAL
  Filled 2016-12-20 (×5): qty 1

## 2016-12-20 MED ORDER — SODIUM CHLORIDE 0.9 % IV SOLN: 250.0000 mL | INTRAVENOUS | Status: DC | PRN

## 2016-12-20 MED ORDER — ZOLPIDEM TARTRATE 5 MG PO TABS
5.0000 mg | ORAL_TABLET | Freq: Once | ORAL | Status: AC
  Administered 2016-12-20: 5 mg via ORAL
  Filled 2016-12-20: qty 1

## 2016-12-20 MED ORDER — SODIUM CHLORIDE 0.9% FLUSH
3.0000 mL | INTRAVENOUS | Status: DC | PRN
  Administered 2016-12-21: 3 mL via INTRAVENOUS
  Filled 2016-12-20: qty 3

## 2016-12-20 MED ORDER — ONDANSETRON HCL 4 MG/2ML IJ SOLN: 4.0000 mg | Freq: Four times a day (QID) | INTRAMUSCULAR | Status: DC | PRN

## 2016-12-20 MED ORDER — AMIODARONE HCL 200 MG PO TABS
200.0000 mg | ORAL_TABLET | Freq: Two times a day (BID) | ORAL | Status: DC
  Administered 2016-12-20 – 2016-12-21 (×4): 200 mg via ORAL
  Filled 2016-12-20 (×5): qty 1

## 2016-12-20 MED ORDER — ALBUTEROL SULFATE (2.5 MG/3ML) 0.083% IN NEBU
5.0000 mg | INHALATION_SOLUTION | Freq: Once | RESPIRATORY_TRACT | Status: AC
  Administered 2016-12-20: 5 mg via RESPIRATORY_TRACT

## 2016-12-20 MED ORDER — SODIUM CHLORIDE 0.9% FLUSH
3.0000 mL | Freq: Two times a day (BID) | INTRAVENOUS | Status: DC
  Administered 2016-12-20 – 2016-12-21 (×4): 3 mL via INTRAVENOUS

## 2016-12-20 MED ORDER — PHYTONADIONE 5 MG PO TABS
5.0000 mg | ORAL_TABLET | Freq: Once | ORAL | Status: AC
  Administered 2016-12-20: 5 mg via ORAL
  Filled 2016-12-20: qty 1

## 2016-12-20 MED ORDER — ASPIRIN EC 81 MG PO TBEC
81.0000 mg | DELAYED_RELEASE_TABLET | Freq: Every day | ORAL | Status: DC
  Administered 2016-12-20 – 2016-12-22 (×3): 81 mg via ORAL
  Filled 2016-12-20 (×3): qty 1

## 2016-12-20 MED ORDER — FUROSEMIDE 10 MG/ML IJ SOLN
40.0000 mg | Freq: Every day | INTRAMUSCULAR | Status: DC
  Administered 2016-12-20 – 2016-12-21 (×2): 40 mg via INTRAVENOUS
  Filled 2016-12-20 (×2): qty 4

## 2016-12-20 MED ORDER — DOXAZOSIN MESYLATE 2 MG PO TABS
2.0000 mg | ORAL_TABLET | Freq: Every day | ORAL | Status: DC
  Administered 2016-12-20: 2 mg via ORAL
  Filled 2016-12-20 (×2): qty 1

## 2016-12-20 NOTE — Care Management Note (Signed)
Case Management Note  Patient Details  Name: DRE GAMINO MRN: [id number] Date of Birth: Sep 12, 1932  Subjective/Objective:    Pt admitted with CHF. He is from home,  is ind with ADL's. Pt has no DME or HH needs pta. He is not homebound, drives himself to appointments. He has PCP and reports no issues affording medications.               Action/Plan: DC home with self care.    Expected Discharge Date:  12/23/16               Expected Discharge Plan:  Home/Self Care  In-House Referral:     Discharge planning Services  CM Consult  Post Acute Care Choice:    Choice offered to:     DME Arranged:    DME Agency:     HH Arranged:    HH Agency:     Status of Service:  In process, will continue to follow  If discussed at Long Length of Stay Meetings, dates discussed:    Additional Comments:  Trayon Krantz, Chauncey Reading, RN 12/20/2016, 2:03 PM

## 2016-12-20 NOTE — ED Notes (Signed)
Date and time results received: 12/20/16 0250 (use smartphrase ".now" to insert current time)  Test: INR Critical Value: 7.68  Name of Provider Notified: Rancour  Orders Received? Or Actions Taken?:

## 2016-12-20 NOTE — ED Triage Notes (Signed)
Pt states he was here yesterday for sob, a-fib, had cardioversion for same, states his sob worsened since then.  Pt denies cp

## 2016-12-20 NOTE — Progress Notes (Signed)
Patient seen and examined, database reviewed. No family members at bedside. Admitted earlier this morning for shortness of breath and lower extremity edema. Has a history of atrial fibrillation that was cardioverted recently. His Lasix had been discontinued due to worsening renal function. He is now admitted with acute diastolic CHF. Continue Lasix, start for negative fluid balance. We'll continue to follow.  Brett Mend, MD [facility] Pager: [phone]

## 2016-12-20 NOTE — ED Notes (Addendum)
Pt ambulated around the nurses station. O2 sats ranged from 90 to 94%. Pt says he feels much better than when he came in. Pt left on room air to see if O2 sats remain in the 90's

## 2016-12-20 NOTE — Progress Notes (Signed)
ANTICOAGULATION CONSULT NOTE - Initial Consult  Pharmacy Consult for Coumadin (home med) Indication: atrial fibrillation  No Known Allergies  Patient Measurements: Height: 6' (182.9 cm) Weight: 205 lb (93 kg) IBW/kg (Calculated) : 77.6  Vital Signs: Temp: 98.2 F (36.8 C) (09/07 0600) Temp Source: Oral (09/07 0200) BP: 134/69 (09/07 0600) Pulse Rate: 63 (09/07 0600)  Labs:  Recent Labs  12/20/16 0206  HGB 12.2*  HCT 36.7*  PLT 153  LABPROT 64.4*  INR 7.68*  CREATININE 1.20  TROPONINI 0.03*   Estimated Creatinine Clearance: 52.1 mL/min (by C-G formula based on SCr of 1.2 mg/dL).  Medical History: Past Medical History:  Diagnosis Date  . Atrial fibrillation ([place])    On coumadin  . Chronic kidney disease (CKD), stage III (moderate)   . Dysrhythmia    AFib  . Essential hypertension, benign   . History of cardiac catheterization    Details not certain - reportedly no interventions  . Hypothyroidism   . Macular degeneration   . Pulmonary hypertension ([place]) 12/03/2013   PA pressure 56. Ejection fraction 55-60%  . Type 2 diabetes mellitus (H[facility])    Medications:  Prescriptions Prior to Admission  Medication Sig Dispense Refill Last Dose  . amiodarone (PACERONE) 200 MG tablet Take 400 mg twice a day for one week, then 200 mg twice a day for 2 weeks (Patient taking differently: Take 200 mg by mouth 2 (two) times daily. ) 90 tablet 3 12/18/2016 at 0600  . doxazosin (CARDURA) 2 MG tablet Take 1 tablet (2 mg total) by mouth at bedtime. 90 tablet 3 Past Week at Unknown time  . furosemide (LASIX) 40 MG tablet Take 1 tablet (40 mg total) by mouth daily. (Patient taking differently: Take 40 mg by mouth daily as needed for fluid or edema. ) 90 tablet 3 Taking  . levothyroxine (SYNTHROID, LEVOTHROID) 50 MCG tablet Take 50 mcg by mouth daily before breakfast.    12/18/2016 at 0600  . MELATONIN PO Take 4 mg by mouth at bedtime. 2 mg tablets   Past Week at Unknown time  . metFORMIN  (GLUCOPHAGE) 500 MG tablet Take 1 tablet (500 mg total) by mouth 2 (two) times daily with a meal. 180 tablet 1 12/17/2016 at Unknown time  . metoprolol tartrate (LOPRESSOR) 50 MG tablet Take 1 tablet (50 mg total) by mouth 2 (two) times daily. 180 tablet 3 12/17/2016 at Unknown time  . warfarin (COUMADIN) 3 MG tablet Take 1 tablet (3 mg total) by mouth daily at 6 PM. 30 tablet 0 12/17/2016 at Unknown time    Assessment: 83yo male history significant of CHF, CKD, afib recently converted in the last couple weeks and placed on amiodorone, chronic coumadin, pHTN, DM comes in with over 2 days of worsening sob, pnd, and orthopnea with ble swelling.  Pt reports he was in the hospital about 2 weeks ago, for afib with rvr was cardioverted and placed on amiodorone, his lasix was stopped due to worsening renal function (cr bumped up to 1.6).  On admission INR is SUPRAtherapeutic (7.68), pt given Vitamin K 5mg  per MD  Goal of Therapy:  INR 2-3 Monitor platelets by anticoagulation protocol: Yes   Plan:  HOLD coumadin today INR daily  [place] Crane, Maylene Crocker A 12/20/2016,9:11 AM

## 2016-12-20 NOTE — H&P (Signed)
History and Physical    Brett [name] XBD:[id number] DOB: 1933-02-19 DOA: 12/20/2016  PCP: [name], MD  Patient coming from: home  Chief Complaint:  Sob, swelling  HPI: Brett [name] is a 83 y.o. male with medical history significant of CHF, CKD, afib recently converted in the last couple weeks and placed on amiodorone, chronic coumadin, pHTN, DM comes in with over 2 days of worsening sob, pnd, and orthopnea with ble swelling.  Pt reports he was in the hospital about 2 weeks ago, for afib with rvr was cardioverted and placed on amiodorone, his lasix was stopped due to worsening renal function (cr bumped up to 1.6).  When he left hospital he was feeling good and had some swelling in legs but not as much as now.  He does not have supplemental oxgyen needs at home.  Comes in sob, hypoxic to 88% on RA at rest.  Referred for admission for chf exacerbation. He has been given 40mg  iv lasix in ED and already urinated a liter and says he is feeling much better already.  Review of Systems: As per HPI otherwise 10 point review of systems negative.   Past Medical History:  Diagnosis Date  . Atrial fibrillation ([place])    On coumadin  . Chronic kidney disease (CKD), stage III (moderate)   . Dysrhythmia    AFib  . Essential hypertension, benign   . History of cardiac catheterization    Details not certain - reportedly no interventions  . Hypothyroidism   . Macular degeneration   . Pulmonary hypertension ([place]) 12/03/2013   PA pressure 56. Ejection fraction 55-60%  . Type 2 diabetes mellitus ([place])     Past Surgical History:  Procedure Laterality Date  . CARDIOVERSION N/A 12/10/2013   Procedure: CARDIOVERSION;  Surgeon: Herminio Commons, MD;  Location: [facility];  Service: Endoscopy;  Laterality: N/A;  . CARDIOVERSION N/A 11/15/2016   Procedure: CARDIOVERSION;  Surgeon: Arnoldo Lenis, MD;  Location: [facility];  Service: Endoscopy;  Laterality: N/A;  . CARDIOVERSION N/A 12/18/2016   Procedure:  CARDIOVERSION;  Surgeon: Arnoldo Lenis, MD;  Location: [facility];  Service: Endoscopy;  Laterality: N/A;  pt knows to arrive at 8:00  . CATARACT EXTRACTION W/PHACO Left 11/13/2015   Procedure: CATARACT EXTRACTION PHACO AND INTRAOCULAR LENS PLACEMENT LEFT EYE CDE=7.76;  Surgeon: Williams Che, MD;  Location: [facility];  Service: Ophthalmology;  Laterality: Left;  . CHOLECYSTECTOMY    . COLONOSCOPY     Remote > 10 years ago  . ERCP N/A 10/17/2016   Dr. Gala Romney: sphincterotomy and stone extraction   . REMOVAL OF STONES  10/17/2016   Procedure: REMOVAL OF STONES with balloon;  Surgeon: Daneil Dolin, MD;  Location: [facility];  Service: Endoscopy;;  . REPLACEMENT TOTAL HIP W/  RESURFACING IMPLANTS Bilateral   . SPHINCTEROTOMY  10/17/2016   Procedure: BILLARY SPHINCTEROTOMY with ballon dilation;  Surgeon: Daneil Dolin, MD;  Location: [facility];  Service: Endoscopy;;  . TEE WITHOUT CARDIOVERSION N/A 12/10/2013   Procedure: TRANSESOPHAGEAL ECHOCARDIOGRAM (TEE);  Surgeon: Herminio Commons, MD;  Location: [facility];  Service: Endoscopy;  Laterality: N/A;     reports that he quit smoking about 38 years ago. His smoking use included Cigarettes. He started smoking about 64 years ago. He has a 37.00 pack-year smoking history. He has never used smokeless tobacco. He reports that he drinks alcohol. He reports that he does not use drugs.  No Known Allergies  Family History  Problem Relation Age of Onset  . Hypertension Mother   . Hypertension Father   . Colon cancer Neg Hx     Prior to Admission medications   Medication Sig Start Date End Date Taking? Authorizing Provider  amiodarone (PACERONE) 200 MG tablet Take 400 mg twice a day for one week, then 200 mg twice a day for 2 weeks Patient taking differently: Take 200 mg by mouth 2 (two) times daily.  11/27/16   Deboraha Sprang, MD  doxazosin (CARDURA) 2 MG tablet Take 1 tablet (2 mg total) by mouth at bedtime. 04/24/15   Herminio Commons,  MD  furosemide (LASIX) 40 MG tablet Take 1 tablet (40 mg total) by mouth daily. Patient taking differently: Take 40 mg by mouth daily as needed for fluid or edema.  11/12/16 02/10/17  Herminio Commons, MD  levothyroxine (SYNTHROID, LEVOTHROID) 50 MCG tablet Take 50 mcg by mouth daily before breakfast.     [provider]  MELATONIN PO Take 4 mg by mouth at bedtime. 2 mg tablets    [provider]  metFORMIN (GLUCOPHAGE) 500 MG tablet Take 1 tablet (500 mg total) by mouth 2 (two) times daily with a meal. 11/16/16   Orvan Falconer, MD  metoprolol tartrate (LOPRESSOR) 50 MG tablet Take 1 tablet (50 mg total) by mouth 2 (two) times daily. 11/27/16 02/25/17  Deboraha Sprang, MD  warfarin (COUMADIN) 3 MG tablet Take 1 tablet (3 mg total) by mouth daily at 6 PM. 10/20/16   Orson Eva, MD    Physical Exam: Vitals:   12/20/16 0228 12/20/16 0230 12/20/16 0356 12/20/16 0400  BP:  (!) 165/80  132/74  Pulse:  62  60  Resp:  (!) 25  (!) 21  Temp:      TempSrc:      SpO2: 97% 100% 95% 91%  Weight:      Height:          Constitutional: NAD, calm, comfortable Vitals:   12/20/16 0228 12/20/16 0230 12/20/16 0356 12/20/16 0400  BP:  (!) 165/80  132/74  Pulse:  62  60  Resp:  (!) 25  (!) 21  Temp:      TempSrc:      SpO2: 97% 100% 95% 91%  Weight:      Height:       Eyes: PERRL, lids and conjunctivae normal ENMT: Mucous membranes are moist. Posterior pharynx clear of any exudate or lesions.Normal dentition.  Neck: normal, supple, no masses, no thyromegaly Respiratory: clear to auscultation bilaterally, no wheezing, no crackles. Normal respiratory effort. No accessory muscle use.  Cardiovascular: Regular rate and rhythm, no murmurs / rubs / gallops. 1+ extremity edema. 2+ pedal pulses. No carotid bruits.  Abdomen: no tenderness, no masses palpated. No hepatosplenomegaly. Bowel sounds positive.  Musculoskeletal: no clubbing / cyanosis. No joint deformity upper and lower extremities.  Good ROM, no contractures. Normal muscle tone.  Skin: no rashes, lesions, ulcers. No induration Neurologic: CN 2-12 grossly intact. Sensation intact, DTR normal. Strength 5/5 in all 4.  Psychiatric: Normal judgment and insight. Alert and oriented x 3. Normal mood.    Labs on Admission: I have personally reviewed following labs and imaging studies  CBC:  Recent Labs Lab 12/20/16 0206  WBC 8.2  NEUTROABS 6.3  HGB 12.2*  HCT 36.7*  MCV 90.2  PLT 253   Basic Metabolic Panel:  Recent Labs Lab 12/20/16 0206  NA 135  K 4.1  CL  101  CO2 25  GLUCOSE 168*  BUN 18  CREATININE 1.20  CALCIUM 8.7*   GFR: Estimated Creatinine Clearance: 52.1 mL/min (by C-G formula based on SCr of 1.2 mg/dL).  Coagulation Profile:  Recent Labs Lab 12/20/16 0206  INR 7.68*   CBG:  Recent Labs Lab 12/18/16 0759  GLUCAP 130*     Radiological Exams on Admission: Dg Chest 2 View  Result Date: 12/20/2016 CLINICAL DATA:  Shortness of breath since cardioversion on Wednesday morning. Cough. History of atrial fibrillation, hypertension, diabetes. EXAM: CHEST  2 VIEW COMPARISON:  11/12/2016 FINDINGS: Shallow inspiration. Heart size and pulmonary vascularity are normal. Small bilateral pleural effusions. No airspace disease or consolidation in the lungs. Calcified granulomas in the left mid lung. Calcification of the aorta. Degenerative changes in the spine and shoulders. IMPRESSION: Small bilateral pleural effusions.  Aortic atherosclerosis. Electronically Signed   By: Lucienne Capers M.D.   On: 12/20/2016 02:58    EKG: Independently reviewed. nsr   Assessment/Plan 83 yo male with acute on chronic diastolic chf exacerbation  Principal Problem:   Acute on chronic diastolic CHF (congestive heart failure) ([place])- hypoxic 88% on RA.  Better on 2 liters at 95% and feeling better with that.  Place on lasix 40mg  iv daily and monitori/o and wts closely.  Cr normal now.  Will need daily dose of lasix at  d/c.  Wean oxygen as tolerates.  Active Problems:   Type 2 diabetes mellitus with diabetic nephropathy (H[facility])- ssi   Chronic kidney disease (CKD), stage III (moderate)- improved at this time   Obesity, morbid ([place])- noted   Pulmonary hypertension ([place])- noted   A-fib ([place])- NSR now   Coagulopathy ([place])- due to recent amiodorone load and usage, given vit k in ED, no bleeding.  Consult pharm but would allow inr to float down on its own and not aggressively reverse   Med rec pending   DVT prophylaxis:  coumadin Code Status:  full Family Communication:  none Disposition Plan:  Per day team Consults called:  none Admission status:  admission   DAVID,RACHAL A MD [facility]  If 7PM-7AM, please contact night-coverage [email or web address] Password [email or web address]  12/20/2016, 5:30 AM

## 2016-12-20 NOTE — ED Provider Notes (Signed)
[facility] DEPT Provider Note   CSN: [id number] Arrival date & time: 12/20/16  0143     History   Chief Complaint Chief Complaint  Patient presents with  . Shortness of Breath    HPI Brett [name] is a 83 y.o. male.  Patient presents with worsening shortness of breath over the past 2 days. Underwent elective cardioversion for atrial fibrillation and shortness of breath became worse after that. Does have a history of congestive heart failure. He states his diuretics were stopped for unclear reasons. Denies any chest pain. Cc of increased swelling in his legs as well as worsening PND and orthopnea and dyspnea on exertion. Denies chest pain. No cough or fever. Denies any abdominal pain or vomiting.   The history is provided by the patient.  Shortness of Breath  Associated symptoms include leg swelling. Pertinent negatives include no headaches, no cough, no chest pain, no vomiting, no abdominal pain and no rash.    Past Medical History:  Diagnosis Date  . Atrial fibrillation ([place])    On coumadin  . Chronic kidney disease (CKD), stage III (moderate)   . Dysrhythmia    AFib  . Essential hypertension, benign   . History of cardiac catheterization    Details not certain - reportedly no interventions  . Hypothyroidism   . Macular degeneration   . Pulmonary hypertension ([place]) 12/03/2013   PA pressure 56. Ejection fraction 55-60%  . Type 2 diabetes mellitus [facility])     Patient Active Problem List   Diagnosis Date Noted  . Acute congestive heart failure ([place])   . Acute on chronic diastolic CHF (congestive heart failure) ([place]) 10/24/2016  . Jaundice   . Periumbilical pain   . Elevated lipase   . Coagulopathy ([place]) 10/16/2016  . Elevated liver enzymes   . Hyperbilirubinemia   . Hyponatremia   . Abdominal pain 10/15/2016  . A-fib ([place]) 02/23/2014  . Diabetes ([place]) 02/23/2014  . BP (high blood pressure) 02/23/2014  . HLD (hyperlipidemia) 02/23/2014  . Excess weight  02/23/2014  . Chronic diastolic heart failure ([place]) 12/24/2013  . Acute bronchitis 12/06/2013  . DIC (disseminated intravascular coagulation) ([place]) 12/04/2013  . Thrombocytopenia, unspecified ([place]) 12/03/2013  . Pulmonary hypertension ([place]) 12/03/2013  . Wheezing 12/02/2013  . Obesity, morbid ([place]) 12/02/2013  . Transaminitis 12/02/2013  . Type 2 diabetes mellitus with diabetic nephropathy ([place]) 12/01/2013  . Hypertension 12/01/2013  . Atrial fibrillation with RVR ([place]) 12/01/2013  . Gall stones, common bile duct 12/01/2013  . Pancreatitis due to biliary obstruction 12/01/2013  . Chronic kidney disease (CKD), stage III (moderate) 12/01/2013  . Hypothyroidism 12/01/2013  . Cholelithiasis 12/01/2013  . History of other specified conditions presenting hazards to health 02/09/2008  . Lumbar back sprain 02/09/2008  . Herpes zoster keratoconjunctivitis 09/15/2007  . Acute systolic heart failure ([place]) 08/20/2007  . PNA (pneumonia) 08/20/2007  . Fast heart beat 08/20/2007  . Feeling bilious 08/16/2007    Past Surgical History:  Procedure Laterality Date  . CARDIOVERSION N/A 12/10/2013   Procedure: CARDIOVERSION;  Surgeon: Herminio Commons, MD;  Location: [facility] ORS;  Service: Endoscopy;  Laterality: N/A;  . CARDIOVERSION N/A 11/15/2016   Procedure: CARDIOVERSION;  Surgeon: Arnoldo Lenis, MD;  Location: [facility] ORS;  Service: Endoscopy;  Laterality: N/A;  . CARDIOVERSION N/A 12/18/2016   Procedure: CARDIOVERSION;  Surgeon: Arnoldo Lenis, MD;  Location: [facility] ENDO SUITE;  Service: Endoscopy;  Laterality: N/A;  pt knows to arrive at 8:00  . CATARACT EXTRACTION W/PHACO  Left 11/13/2015   Procedure: CATARACT EXTRACTION PHACO AND INTRAOCULAR LENS PLACEMENT LEFT EYE CDE=7.76;  Surgeon: Williams Che, MD;  Location: [facility] ORS;  Service: Ophthalmology;  Laterality: Left;  . CHOLECYSTECTOMY    . COLONOSCOPY     Remote > 10 years ago  . ERCP N/A 10/17/2016   Dr. Gala Romney: sphincterotomy and stone  extraction   . REMOVAL OF STONES  10/17/2016   Procedure: REMOVAL OF STONES with balloon;  Surgeon: Daneil Dolin, MD;  Location: [facility] ENDO SUITE;  Service: Endoscopy;;  . REPLACEMENT TOTAL HIP W/  RESURFACING IMPLANTS Bilateral   . SPHINCTEROTOMY  10/17/2016   Procedure: BILLARY SPHINCTEROTOMY with ballon dilation;  Surgeon: Daneil Dolin, MD;  Location: [facility] END[facility] SU[facility];  Service: Endoscopy;;  . TEE WITHOUT CARDIOVERSION N/A 12/10/2013   Procedure: TRANSESOPHAGEAL ECHOCARDIOGRAM (TEE);  Surgeon: Herminio Commons, MD;  Location: [facility] ORS;  Service: Endoscopy;  Laterality: N/A;       Home Medications    Prior to Admission medications   Medication Sig Start Date End Date Taking? Authorizing Provider  amiodarone (PACERONE) 200 MG tablet Take 400 mg twice a day for one week, then 200 mg twice a day for 2 weeks Patient taking differently: Take 200 mg by mouth 2 (two) times daily.  11/27/16   Deboraha Sprang, MD  doxazosin (CARDURA) 2 MG tablet Take 1 tablet (2 mg total) by mouth at bedtime. 04/24/15   Herminio Commons, MD  furosemide (LASIX) 40 MG tablet Take 1 tablet (40 mg total) by mouth daily. Patient taking differently: Take 40 mg by mouth daily as needed for fluid or edema.  11/12/16 02/10/17  Herminio Commons, MD  levothyroxine (SYNTHROID, LEVOTHROID) 50 MCG tablet Take 50 mcg by mouth daily before breakfast.     [provider]  MELATONIN PO Take 4 mg by mouth at bedtime. 2 mg tablets    [provider]  metFORMIN (GLUCOPHAGE) 500 MG tablet Take 1 tablet (500 mg total) by mouth 2 (two) times daily with a meal. 11/16/16   Orvan Falconer, MD  metoprolol tartrate (LOPRESSOR) 50 MG tablet Take 1 tablet (50 mg total) by mouth 2 (two) times daily. 11/27/16 02/25/17  Deboraha Sprang, MD  warfarin (COUMADIN) 3 MG tablet Take 1 tablet (3 mg total) by mouth daily at 6 PM. 10/20/16   Orson Eva, MD    Family History Family History  Problem Relation Age of Onset  . Hypertension Mother    . Hypertension Father   . Colon cancer Neg Hx     Social History Social History  Substance Use Topics  . Smoking status: Former Smoker    Packs/day: 1.00    Years: 37.00    Types: Cigarettes    Start date: 11/01/1952    Quit date: 08/18/1978  . Smokeless tobacco: Never Used  . Alcohol use 0.0 oz/week     Comment: Occasional     Allergies   Patient has no known allergies.   Review of Systems Review of Systems  Constitutional: Positive for activity change. Negative for appetite change.  HENT: Negative for congestion.   Respiratory: Positive for shortness of breath. Negative for cough and chest tightness.   Cardiovascular: Positive for leg swelling. Negative for chest pain.  Gastrointestinal: Negative for abdominal pain, nausea and vomiting.  Genitourinary: Negative for dysuria and hematuria.  Musculoskeletal: Negative for arthralgias and myalgias.  Skin: Negative for rash.  Neurological: Negative for dizziness, weakness and headaches.  all other systems are negative except as noted in the HPI and PMH.    Physical Exam Updated Vital Signs BP (!) 164/85 (BP Location: Right Arm)   Pulse 67   Temp 98.7 F (37.1 C) (Oral)   Resp (!) 28   Ht 6' (1.829 m)   Wt 93 kg (205 lb)   SpO2 97%   BMI 27.80 kg/m   Physical Exam  Constitutional: He is oriented to person, place, and time. He appears well-developed and well-nourished. He appears distressed.  Dyspneic with conversation  HENT:  Head: Normocephalic and atraumatic.  Mouth/Throat: Oropharynx is clear and moist. No oropharyngeal exudate.  Eyes: Pupils are equal, round, and reactive to light. Conjunctivae and EOM are normal.  Neck: Normal range of motion. Neck supple. JVD present. No thyromegaly present.  No meningismus.  Cardiovascular: Normal rate, regular rhythm, normal heart sounds and intact distal pulses.   No murmur heard. Pulmonary/Chest: Effort normal. No respiratory distress. He has rales. He exhibits no  tenderness.  Basilar crackles  Abdominal: Soft. There is no tenderness. There is no rebound and no guarding.  Musculoskeletal: Normal range of motion. He exhibits edema. He exhibits no tenderness.  + 2 edema to knees  Lymphadenopathy:    He has no cervical adenopathy.  Neurological: He is alert and oriented to person, place, and time. No cranial nerve deficit. He exhibits normal muscle tone. Coordination normal.   5/5 strength throughout. CN 2-12 intact.Equal grip strength.   Skin: Skin is warm.  Psychiatric: He has a normal mood and affect. His behavior is normal.  Nursing note and vitals reviewed.    ED Treatments / Results  Labs (all labs ordered are listed, but only abnormal results are displayed) Labs Reviewed  BRAIN NATRIURETIC PEPTIDE - Abnormal; Notable for the following:       Result Value   B Natriuretic Peptide 808.0 (*)    All other components within normal limits  CBC WITH DIFFERENTIAL/PLATELET - Abnormal; Notable for the following:    RBC 4.07 (*)    Hemoglobin 12.2 (*)    HCT 36.7 (*)    All other components within normal limits  BASIC METABOLIC PANEL - Abnormal; Notable for the following:    Glucose, Bld 168 (*)    Calcium 8.7 (*)    GFR calc non Af Amer 54 (*)    All other components within normal limits  PROTIME-INR - Abnormal; Notable for the following:    Prothrombin Time 64.4 (*)    INR 7.68 (*)    All other components within normal limits  TROPONIN I    EKG  EKG Interpretation  Date/Time:  Friday December 20 2016 01:59:16 EDT Ventricular Rate:  67 PR Interval:    QRS Duration: 131 QT Interval:  431 QTC Calculation: 455 R Axis:   -4 Text Interpretation:  Sinus rhythm Prolonged PR interval IVCD, consider atypical RBBB No significant change was found Confirmed by Ezequiel Essex [phone]) on 12/20/2016 2:26:18 AM       Radiology Dg Chest 2 View  Result Date: 12/20/2016 CLINICAL DATA:  Shortness of breath since cardioversion on Wednesday  morning. Cough. History of atrial fibrillation, hypertension, diabetes. EXAM: CHEST  2 VIEW COMPARISON:  11/12/2016 FINDINGS: Shallow inspiration. Heart size and pulmonary vascularity are normal. Small bilateral pleural effusions. No airspace disease or consolidation in the lungs. Calcified granulomas in the left mid lung. Calcification of the aorta. Degenerative changes in the spine and shoulders. IMPRESSION: Small bilateral pleural effusions.  Aortic  atherosclerosis. Electronically Signed   By: Lucienne Capers M.D.   On: 12/20/2016 02:58    Procedures Procedures (including critical care time)  Medications Ordered in ED Medications  furosemide (LASIX) injection 40 mg (not administered)  albuterol (PROVENTIL) (2.5 MG/3ML) 0.083% nebulizer solution 5 mg (5 mg Nebulization Given 12/20/16 0226)     Initial Impression / Assessment and Plan / ED Course  I have reviewed the triage vital signs and the nursing notes.  Pertinent labs & imaging results that were available during my care of the patient were reviewed by me and considered in my medical decision making (see chart for details).     Shortness of breath worsening over since elective cardioversion. No chest pain. EKG shows rate controlled atrial fibrillation. Patient's diuretics were stopped for unclear reasons.  IV Lasix given. Presentation consistent with CHF exacerbation. INR supratherapeutic. Vitamin K given. No evidence of bleeding. Doubt pulmonary embolism. Doubt ACS.  Patient dyspneic with exertion and does have new O2 requirement. O2 saturation 87% on room air.  Patient with tachypnea and dyspnea with conversation. He is given IV Lasix. He feels improved with this treatment. He is given vitamin K for elevated INR. Low suspicion for pulmonary embolism or ACS.  Admission d/w Dr. Shanon Brow.   Final Clinical Impressions(s) / ED Diagnoses   Final diagnoses:  Acute on chronic diastolic congestive heart failure ([place])  Coagulopathy ([place])      New Prescriptions New Prescriptions   No medications on file     Ezequiel Essex, MD 12/20/16 [phone]

## 2016-12-20 NOTE — Care Management Important Message (Signed)
Important Message  Patient Details  Name: Brett [name] MRN: [id number] Date of Birth: [birth year]   Medicare Important Message Given:  Yes    Veena Sturgess, Chauncey Reading, RN 12/20/2016, 2:05 PM

## 2016-12-21 LAB — CBC
HCT: 34.1 % — ABNORMAL LOW (ref 39.0–52.0)
HEMOGLOBIN: 11.2 g/dL — AB (ref 13.0–17.0)
MCH: 30.2 pg (ref 26.0–34.0)
MCHC: 32.8 g/dL (ref 30.0–36.0)
MCV: 91.9 fL (ref 78.0–100.0)
PLATELETS: 156 10*3/uL (ref 150–400)
RBC: 3.71 MIL/uL — AB (ref 4.22–5.81)
RDW: 14 % (ref 11.5–15.5)
WBC: 5.1 10*3/uL (ref 4.0–10.5)

## 2016-12-21 LAB — BASIC METABOLIC PANEL
ANION GAP: 8 (ref 5–15)
BUN: 20 mg/dL (ref 6–20)
CO2: 31 mmol/L (ref 22–32)
Calcium: 8.6 mg/dL — ABNORMAL LOW (ref 8.9–10.3)
Chloride: 99 mmol/L — ABNORMAL LOW (ref 101–111)
Creatinine, Ser: 1.17 mg/dL (ref 0.61–1.24)
GFR calc non Af Amer: 56 mL/min — ABNORMAL LOW (ref >60)
Glucose, Bld: 121 mg/dL — ABNORMAL HIGH (ref 65–99)
POTASSIUM: 3.9 mmol/L (ref 3.5–5.1)
SODIUM: 138 mmol/L (ref 135–145)

## 2016-12-21 LAB — PROTIME-INR
INR: 4.42 — AB
PROTHROMBIN TIME: 41.2 s — AB (ref 11.4–15.2)

## 2016-12-21 LAB — GLUCOSE, CAPILLARY
GLUCOSE-CAPILLARY: 128 mg/dL — AB (ref 65–99)
GLUCOSE-CAPILLARY: 153 mg/dL — AB (ref 65–99)
Glucose-Capillary: 113 mg/dL — ABNORMAL HIGH (ref 65–99)
Glucose-Capillary: 128 mg/dL — ABNORMAL HIGH (ref 65–99)

## 2016-12-21 MED ORDER — FUROSEMIDE 10 MG/ML IJ SOLN
40.0000 mg | Freq: Two times a day (BID) | INTRAMUSCULAR | Status: DC
  Administered 2016-12-21 – 2016-12-22 (×2): 40 mg via INTRAVENOUS
  Filled 2016-12-21 (×3): qty 4

## 2016-12-21 MED ORDER — LEVOTHYROXINE SODIUM 50 MCG PO TABS
50.0000 ug | ORAL_TABLET | Freq: Every day | ORAL | Status: DC
  Administered 2016-12-22: 50 ug via ORAL
  Filled 2016-12-21: qty 1

## 2016-12-21 MED ORDER — DOXAZOSIN MESYLATE 2 MG PO TABS
2.0000 mg | ORAL_TABLET | Freq: Every day | ORAL | Status: DC
  Administered 2016-12-21: 2 mg via ORAL
  Filled 2016-12-21: qty 1

## 2016-12-21 MED ORDER — ZOLPIDEM TARTRATE 5 MG PO TABS
5.0000 mg | ORAL_TABLET | Freq: Every evening | ORAL | Status: DC | PRN
  Administered 2016-12-21: 5 mg via ORAL
  Filled 2016-12-21: qty 1

## 2016-12-21 MED ORDER — DOCUSATE SODIUM 100 MG PO CAPS
100.0000 mg | ORAL_CAPSULE | Freq: Two times a day (BID) | ORAL | Status: DC
  Administered 2016-12-21 – 2016-12-22 (×2): 100 mg via ORAL
  Filled 2016-12-21 (×2): qty 1

## 2016-12-21 MED ORDER — AMIODARONE HCL 200 MG PO TABS
200.0000 mg | ORAL_TABLET | Freq: Every day | ORAL | Status: DC
  Administered 2016-12-22: 200 mg via ORAL
  Filled 2016-12-21: qty 1

## 2016-12-21 NOTE — Progress Notes (Signed)
CRITICAL VALUE ALERT  Critical Value:  INR 4.46  Date & Time Notied:  9/89/18 0906  Provider Notified: Dr. Jerilee Hoh  Orders Received/Actions taken: None at this time

## 2016-12-21 NOTE — Progress Notes (Addendum)
PROGRESS NOTE    Brett [name]  JME:[id number] DOB: 20-Dec-1932 DOA: 12/20/2016 PCP: [name], MD     Brief Narrative:  83 year old man admitted from home on 9/7 due to shortness of breath. Has atrial fibrillation now on amiodarone Coumadin who was recently cardioverted. His Lasix was held due to worsening renal function by cardiology. He returns today due to shortness of breath, was hypoxemic to 88% on room air and admission was requested for acute diastolic CHF management.   Assessment & Plan:   Principal Problem:   Acute on chronic diastolic CHF (congestive heart failure) (HCC) Active Problems:   Type 2 diabetes mellitus with diabetic nephropathy (HCC)   Chronic kidney disease (CKD), stage III (moderate)   Obesity, morbid (HCC)   Pulmonary hypertension (HCC)   A-fib (HCC)   Diabetes (HCC)   Coagulopathy (HCC)   Acute on chronic diastolic CHF -Echo from 22/97/9892 shows an ejection fraction of 50-55% with moderate LVH. No mention of diastolic function. -Was placed on Lasix 40 mg IV twice daily on admission has diuresed approximately 1.3 L with improved shortness of breath and a decrease in oxygen requirements. Renal function remains stable. -However, he still appears hypervolemic on exam with about 2+ carotid extremity edema. -Will keep in the hospital for continued diuresis.  History of recent atrial fibrillation -Currently maintaining sinus rhythm. -Continue Coumadin, amiodarone, metoprolol.  Hypothyroidism -Continue Synthroid   DVT prophylaxis: Coumadin  Code Status: Full code  Family Communication: Wife at bedside  Disposition Plan: Anticipate discharge home in 1-2 days   Consultants:  None  Procedures:  None  Antimicrobials:  Anti-infectives    None       Subjective: Feels well, no longer SOB, denies CP.  Objective: Vitals:   12/20/16 2053 12/20/16 2100 12/21/16 0757 12/21/16 1415  BP:  (!) 153/69 (!) 152/72 111/60  Pulse:  (!) 57 (!) 56 (!) 55    Resp:  20 20 20   Temp:  97.9 F (36.6 C) 97.7 F (36.5 C) 97.7 F (36.5 C)  TempSrc:  Oral Oral Oral  SpO2: 96% 96% 100% 95%  Weight:   92.9 kg (204 lb 12.8 oz)   Height:        Intake/Output Summary (Last 24 hours) at 12/21/16 1614 Last data filed at 12/21/16 1416  Gross per 24 hour  Intake              720 ml  Output             1525 ml  Net             -805 ml   Filed Weights   12/20/16 0158 12/21/16 0757  Weight: 93 kg (205 lb) 92.9 kg (204 lb 12.8 oz)    Examination:  General exam: Alert, awake, oriented x 3 Respiratory system: Clear to auscultation. Respiratory effort normal. Cardiovascular system:RRR. No murmurs, rubs, gallops. Gastrointestinal system: Abdomen is nondistended, soft and nontender. No organomegaly or masses felt. Normal bowel sounds heard. Central nervous system: Alert and oriented. No focal neurological deficits. Extremities: 1-2+ pitting edema bilaterally  Skin: No rashes, lesions or ulcers Psychiatry: Judgement and insight appear normal. Mood & affect appropriate.     Data Reviewed: I have personally reviewed following labs and imaging studies  CBC:  Recent Labs Lab 12/20/16 0206 12/21/16 0630  WBC 8.2 5.1  NEUTROABS 6.3  --   HGB 12.2* 11.2*  HCT 36.7* 34.1*  MCV 90.2 91.9  PLT 153 156   Basic  Metabolic Panel:  Recent Labs Lab 12/20/16 0206 12/21/16 0630  NA 135 138  K 4.1 3.9  CL 101 99*  CO2 25 31  GLUCOSE 168* 121*  BUN 18 20  CREATININE 1.20 1.17  CALCIUM 8.7* 8.6*   GFR: Estimated Creatinine Clearance: 53.4 mL/min (by C-G formula based on SCr of 1.17 mg/dL). Liver Function Tests: No results for input(s): AST, ALT, ALKPHOS, BILITOT, PROT, ALBUMIN in the last 168 hours. No results for input(s): LIPASE, AMYLASE in the last 168 hours. No results for input(s): AMMONIA in the last 168 hours. Coagulation Profile:  Recent Labs Lab 12/20/16 0206 12/21/16 0630  INR 7.68* 4.42*   Cardiac Enzymes:  Recent Labs Lab  12/20/16 0206  TROPONINI 0.03*   BNP (last 3 results) No results for input(s): PROBNP in the last 8760 hours. HbA1C: No results for input(s): HGBA1C in the last 72 hours. CBG:  Recent Labs Lab 12/20/16 1137 12/20/16 1622 12/20/16 1939 12/21/16 0752 12/21/16 1125  GLUCAP 95 117* 122* 128* 153*   Lipid Profile: No results for input(s): CHOL, HDL, LDLCALC, TRIG, CHOLHDL, LDLDIRECT in the last 72 hours. Thyroid Function Tests: No results for input(s): TSH, T4TOTAL, FREET4, T3FREE, THYROIDAB in the last 72 hours. Anemia Panel: No results for input(s): VITAMINB12, FOLATE, FERRITIN, TIBC, IRON, RETICCTPCT in the last 72 hours. Urine analysis:    Component Value Date/Time   COLORURINE AMBER (A) 10/15/2016 0320   APPEARANCEUR CLEAR 10/15/2016 0320   LABSPEC >1.046 (H) 10/15/2016 0320   PHURINE 5.0 10/15/2016 0320   GLUCOSEU 50 (A) 10/15/2016 0320   HGBUR MODERATE (A) 10/15/2016 0320   BILIRUBINUR NEGATIVE 10/15/2016 0320   KETONESUR 5 (A) 10/15/2016 0320   PROTEINUR NEGATIVE 10/15/2016 0320   UROBILINOGEN 0.2 12/01/2013 0740   NITRITE NEGATIVE 10/15/2016 0320   LEUKOCYTESUR NEGATIVE 10/15/2016 0320   Sepsis Labs: @LABRCNTIP (procalcitonin:4,lacticidven:4)  )No results found for this or any previous visit (from the past 240 hour(s)).       Radiology Studies: Dg Chest 2 View  Result Date: 12/20/2016 CLINICAL DATA:  Shortness of breath since cardioversion on Wednesday morning. Cough. History of atrial fibrillation, hypertension, diabetes. EXAM: CHEST  2 VIEW COMPARISON:  11/12/2016 FINDINGS: Shallow inspiration. Heart size and pulmonary vascularity are normal. Small bilateral pleural effusions. No airspace disease or consolidation in the lungs. Calcified granulomas in the left mid lung. Calcification of the aorta. Degenerative changes in the spine and shoulders. IMPRESSION: Small bilateral pleural effusions.  Aortic atherosclerosis. Electronically Signed   By: Lucienne Capers  M.D.   On: 12/20/2016 02:58        Scheduled Meds: . amiodarone  200 mg Oral BID  . aspirin EC  81 mg Oral Daily  . doxazosin  2 mg Oral QHS  . furosemide  40 mg Intravenous Q12H  . insulin aspart  0-9 Units Subcutaneous TID WC  . metoprolol tartrate  50 mg Oral BID  . sodium chloride flush  3 mL Intravenous Q12H   Continuous Infusions: . sodium chloride       LOS: 1 day    Time spent: 30 minutes. Greater than 50% of this time was spent in direct contact with the patient coordinating care.     Lelon Frohlich, MD [facility] Pager [phone]  If 7PM-7AM, please contact night-coverage [email or web address] Password [email or web address] 12/21/2016, 4:14 PM

## 2016-12-21 NOTE — Progress Notes (Signed)
[facility] for Coumadin (home med) Indication: atrial fibrillation  No Known Allergies  Patient Measurements: Height: 6' (182.9 cm) Weight: 204 lb 12.8 oz (92.9 kg) IBW/kg (Calculated) : 77.6  Vital Signs: Temp: 97.7 F (36.5 C) (09/08 0757) Temp Source: Oral (09/08 0757) BP: 152/72 (09/08 0757) Pulse Rate: 56 (09/08 0757)  Labs:  Recent Labs  12/20/16 0206 12/21/16 0630  HGB 12.2* 11.2*  HCT 36.7* 34.1*  PLT 153 156  LABPROT 64.4* 41.2*  INR 7.68* 4.42*  CREATININE 1.20 1.17  TROPONINI 0.03*  --    Estimated Creatinine Clearance: 53.4 mL/min (by C-G formula based on SCr of 1.17 mg/dL).  Medical History: Past Medical History:  Diagnosis Date  . Atrial fibrillation ([place])    On coumadin  . Chronic kidney disease (CKD), stage III (moderate)   . Dysrhythmia    AFib  . Essential hypertension, benign   . History of cardiac catheterization    Details not certain - reportedly no interventions  . Hypothyroidism   . Macular degeneration   . Pulmonary hypertension ([place]) 12/03/2013   PA pressure 56. Ejection fraction 55-60%  . Type 2 diabetes mellitus (HCC)    Medications:  Prescriptions Prior to Admission  Medication Sig Dispense Refill Last Dose  . albuterol (PROVENTIL HFA;VENTOLIN HFA) 108 (90 Base) MCG/ACT inhaler Inhale 1-2 puffs into the lungs every 6 (six) hours as needed for wheezing or shortness of breath.   12/19/2016 at Unknown time  . amiodarone (PACERONE) 200 MG tablet Take 200 mg by mouth daily.   12/20/2016 at Unknown time  . doxazosin (CARDURA) 2 MG tablet Take 1 tablet (2 mg total) by mouth at bedtime. 90 tablet 3 12/19/2016 at Unknown time  . furosemide (LASIX) 40 MG tablet Take 1 tablet (40 mg total) by mouth daily. (Patient taking differently: Take 40 mg by mouth daily as needed for fluid or edema. ) 90 tablet 3 Taking  . levothyroxine (SYNTHROID, LEVOTHROID) 50 MCG tablet Take 50 mcg by mouth daily before breakfast.     12/19/2016 at Unknown time  . MELATONIN PO Take 4 mg by mouth at bedtime. 2 mg tablets   12/19/2016 at Unknown time  . metFORMIN (GLUCOPHAGE) 500 MG tablet Take 1 tablet (500 mg total) by mouth 2 (two) times daily with a meal. 180 tablet 1 12/19/2016 at Unknown time  . metoprolol tartrate (LOPRESSOR) 50 MG tablet Take 1 tablet (50 mg total) by mouth 2 (two) times daily. 180 tablet 3 12/19/2016 at 2200  . warfarin (COUMADIN) 3 MG tablet Take 1 tablet (3 mg total) by mouth daily at 6 PM. 30 tablet 0 12/19/2016 at 2200    Assessment: 82yo male history significant of CHF, CKD, afib recently converted in the last couple weeks and placed on amiodorone, chronic coumadin, pHTN, DM comes in with over 2 days of worsening sob, pnd, and orthopnea with ble swelling.  Pt reports he was in the hospital about 2 weeks ago, for afib with rvr was cardioverted and placed on amiodorone, his lasix was stopped due to worsening renal function (cr bumped up to 1.6).  INR is SUPRAtherapeutic, pt given Vitamin K 5mg  on admission per MD  Goal of Therapy:  INR 2-3 Monitor platelets by anticoagulation protocol: Yes   Plan:  HOLD coumadin today INR daily  [place] Crane, Lynnette Pote A 12/21/2016,10:15 AM

## 2016-12-22 DIAGNOSIS — N183 Chronic kidney disease, stage 3 (moderate): Secondary | ICD-10-CM

## 2016-12-22 LAB — BASIC METABOLIC PANEL
ANION GAP: 8 (ref 5–15)
BUN: 22 mg/dL — ABNORMAL HIGH (ref 6–20)
CO2: 34 mmol/L — AB (ref 22–32)
Calcium: 8.4 mg/dL — ABNORMAL LOW (ref 8.9–10.3)
Chloride: 96 mmol/L — ABNORMAL LOW (ref 101–111)
Creatinine, Ser: 1.46 mg/dL — ABNORMAL HIGH (ref 0.61–1.24)
GFR calc Af Amer: 50 mL/min — ABNORMAL LOW (ref >60)
GFR calc non Af Amer: 43 mL/min — ABNORMAL LOW (ref >60)
GLUCOSE: 130 mg/dL — AB (ref 65–99)
POTASSIUM: 3.5 mmol/L (ref 3.5–5.1)
Sodium: 138 mmol/L (ref 135–145)

## 2016-12-22 LAB — GLUCOSE, CAPILLARY
GLUCOSE-CAPILLARY: 117 mg/dL — AB (ref 65–99)
Glucose-Capillary: 130 mg/dL — ABNORMAL HIGH (ref 65–99)

## 2016-12-22 LAB — PROTIME-INR
INR: 4.04
Prothrombin Time: 39 seconds — ABNORMAL HIGH (ref 11.4–15.2)

## 2016-12-22 MED ORDER — FUROSEMIDE 40 MG PO TABS: 40.0000 mg | ORAL_TABLET | Freq: Every day | ORAL | 2 refills | Status: DC

## 2016-12-22 MED ORDER — ASPIRIN 81 MG PO TBEC: 81.0000 mg | DELAYED_RELEASE_TABLET | Freq: Every day | ORAL | Status: DC

## 2016-12-22 NOTE — Progress Notes (Signed)
[facility] for Coumadin (home med) Indication: atrial fibrillation  No Known Allergies  Patient Measurements: Height: 6' (182.9 cm) Weight: 199 lb 14.4 oz (90.7 kg) IBW/kg (Calculated) : 77.6  Vital Signs: Temp: 97.8 F (36.6 C) (09/09 0428) Temp Source: Oral (09/09 0428) BP: 139/87 (09/09 0428) Pulse Rate: 60 (09/09 0851)  Labs:  Recent Labs  12/20/16 0206 12/21/16 0630 12/22/16 0648  HGB 12.2* 11.2*  --   HCT 36.7* 34.1*  --   PLT 153 156  --   LABPROT 64.4* 41.2* 39.0*  INR 7.68* 4.42* 4.04*  CREATININE 1.20 1.17 1.46*  TROPONINI 0.03*  --   --    Estimated Creatinine Clearance: 42.8 mL/min (A) (by C-G formula based on SCr of 1.46 mg/dL (H)).  Medical History: Past Medical History:  Diagnosis Date  . Atrial fibrillation ([place])    On coumadin  . Chronic kidney disease (CKD), stage III (moderate)   . Dysrhythmia    AFib  . Essential hypertension, benign   . History of cardiac catheterization    Details not certain - reportedly no interventions  . Hypothyroidism   . Macular degeneration   . Pulmonary hypertension ([place]) 12/03/2013   PA pressure 56. Ejection fraction 55-60%  . Type 2 diabetes mellitus (HCC)    Medications:  Prescriptions Prior to Admission  Medication Sig Dispense Refill Last Dose  . albuterol (PROVENTIL HFA;VENTOLIN HFA) 108 (90 Base) MCG/ACT inhaler Inhale 1-2 puffs into the lungs every 6 (six) hours as needed for wheezing or shortness of breath.   12/19/2016 at Unknown time  . amiodarone (PACERONE) 200 MG tablet Take 200 mg by mouth daily.   12/20/2016 at Unknown time  . doxazosin (CARDURA) 2 MG tablet Take 1 tablet (2 mg total) by mouth at bedtime. 90 tablet 3 12/19/2016 at Unknown time  . furosemide (LASIX) 40 MG tablet Take 1 tablet (40 mg total) by mouth daily. (Patient taking differently: Take 40 mg by mouth daily as needed for fluid or edema. ) 90 tablet 3 Taking  . levothyroxine (SYNTHROID, LEVOTHROID)  50 MCG tablet Take 50 mcg by mouth daily before breakfast.    12/19/2016 at Unknown time  . MELATONIN PO Take 4 mg by mouth at bedtime. 2 mg tablets   12/19/2016 at Unknown time  . metFORMIN (GLUCOPHAGE) 500 MG tablet Take 1 tablet (500 mg total) by mouth 2 (two) times daily with a meal. 180 tablet 1 12/19/2016 at Unknown time  . metoprolol tartrate (LOPRESSOR) 50 MG tablet Take 1 tablet (50 mg total) by mouth 2 (two) times daily. 180 tablet 3 12/19/2016 at 2200  . warfarin (COUMADIN) 3 MG tablet Take 1 tablet (3 mg total) by mouth daily at 6 PM. 30 tablet 0 12/19/2016 at 2200   Assessment: 83yo male history significant of CHF, CKD, afib recently converted in the last couple weeks and placed on amiodorone, chronic coumadin, pHTN, DM comes in with over 2 days of worsening sob, pnd, and orthopnea with ble swelling.  Pt reports he was in the hospital about 2 weeks ago, for afib with rvr was cardioverted and placed on amiodorone, his lasix was stopped due to worsening renal function (cr bumped up to 1.6).  INR is SUPRAtherapeutic, pt given Vitamin K 5mg  on admission per MD  Goal of Therapy:  INR 2-3 Monitor platelets by anticoagulation protocol: Yes   Plan:  HOLD coumadin today INR daily  Hart Robinsons A 12/22/2016,9:33 AM

## 2016-12-22 NOTE — Progress Notes (Signed)
CRITICAL VALUE ALERT  Critical Value:  INR 4.04  Date & Time Notied:  12/22/2016  Provider Notified: Dr. Jerilee Hoh  Orders Received/Actions taken:  Dr. Jerilee Hoh gave order for patient to hold Coumadin on Sunday, Monday, Tuesday and restart on Wednesday, December 25, 2016

## 2016-12-22 NOTE — Discharge Summary (Signed)
Physician Discharge Summary  [name] OQH:[id number] DOB: 11/21/1932 DOA: 12/20/2016  PCP: [name], MD  Admit date: 12/20/2016 Discharge date: 12/22/2016  Time spent: 45 minutes  Recommendations for Outpatient Follow-up:  -To be discharged home today. -Will need follow-up with cardiology in 1 week to continue to monitor volume status and renal function.   Discharge Diagnoses:  Principal Problem:   Acute on chronic diastolic CHF (congestive heart failure) (HCC) Active Problems:   Type 2 diabetes mellitus with diabetic nephropathy (HCC)   Chronic kidney disease (CKD), stage III (moderate)   Obesity, morbid ([place])   Pulmonary hypertension ([place])   A-fib ([place])   Diabetes ([place])   Coagulopathy ([place])   Discharge Condition: Stable and improved  Filed Weights   12/20/16 0158 12/21/16 0757 12/22/16 0428  Weight: 93 kg (205 lb) 92.9 kg (204 lb 12.8 oz) 90.7 kg (199 lb 14.4 oz)    History of present illness:  As per Dr. Shanon Brow on 9/7: Brett [name] is a 83 y.o. male with medical history significant of CHF, CKD, afib recently converted in the last couple weeks and placed on amiodorone, chronic coumadin, pHTN, DM comes in with over 2 days of worsening sob, pnd, and orthopnea with ble swelling.  Pt reports he was in the hospital about 2 weeks ago, for afib with rvr was cardioverted and placed on amiodorone, his lasix was stopped due to worsening renal function (cr bumped up to 1.6).  When he left hospital he was feeling good and had some swelling in legs but not as much as now.  He does not have supplemental oxgyen needs at home.  Comes in sob, hypoxic to 88% on RA at rest.  Referred for admission for chf exacerbation. He has been given 40mg  iv lasix in ED and already urinated a liter and says he is feeling much better already.  Hospital Course:   Acute on chronic diastolic CHF -Echo from 54/65/6812 shows an ejection fraction of 50-55% with moderate LVH. No mention of diastolic  function. -Was placed on Lasix 40 mg IV twice daily on admission and has diuresed approximately 3.6 L with improved shortness of breath and no further oxygen requirements. -He now appears euvolemic. Renal function is starting to budge as such will discontinue IV Lasix and place him on Lasix 40 mg daily. Will need follow-up in one week for monitoring volume status and renal function.  History of recent atrial fibrillation -Currently maintaining sinus rhythm. -Continue Coumadin, amiodarone, metoprolol.  Hypothyroidism -Continue Synthroid  Procedures:  None   Consultations:  None  Discharge Instructions  Discharge Instructions    Diet - low sodium heart healthy    Complete by:  As directed    Increase activity slowly    Complete by:  As directed      Allergies as of 12/22/2016   No Known Allergies     Medication List    STOP taking these medications   metoprolol tartrate 50 MG tablet Commonly known as:  LOPRESSOR     TAKE these medications   albuterol 108 (90 Base) MCG/ACT inhaler Commonly known as:  PROVENTIL HFA;VENTOLIN HFA Inhale 1-2 puffs into the lungs every 6 (six) hours as needed for wheezing or shortness of breath.   amiodarone 200 MG tablet Commonly known as:  PACERONE Take 200 mg by mouth daily.   aspirin 81 MG EC tablet Take 1 tablet (81 mg total) by mouth daily.   doxazosin 2 MG tablet Commonly known as:  CARDURA Take 1 tablet (2 mg total) by mouth at bedtime.   furosemide 40 MG tablet Commonly known as:  LASIX Take 1 tablet (40 mg total) by mouth daily. What changed:  when to take this  reasons to take this   levothyroxine 50 MCG tablet Commonly known as:  SYNTHROID, LEVOTHROID Take 50 mcg by mouth daily before breakfast.   MELATONIN PO Take 4 mg by mouth at bedtime. 2 mg tablets   metFORMIN 500 MG tablet Commonly known as:  GLUCOPHAGE Take 1 tablet (500 mg total) by mouth 2 (two) times daily with a meal.   warfarin 3 MG  tablet Commonly known as:  COUMADIN Take 1 tablet (3 mg total) by mouth daily at 6 PM.            Discharge Care Instructions        Start     Ordered   12/23/16 0000  aspirin EC 81 MG EC tablet  Daily     12/22/16 1248   12/22/16 0000  furosemide (LASIX) 40 MG tablet  Daily     12/22/16 1248   12/22/16 0000  Increase activity slowly     12/22/16 1248   12/22/16 0000  Diet - low sodium heart healthy     12/22/16 1248     No Known Allergies Follow-up Information    Herminio Commons, MD. Schedule an appointment as soon as possible for a visit in 1 week(s).   Specialty:  Cardiology Contact information: [address] [place] [address] [phone]            The results of significant diagnostics from this hospitalization (including imaging, microbiology, ancillary and laboratory) are listed below for reference.    Significant Diagnostic Studies: Dg Chest 2 View  Result Date: 12/20/2016 CLINICAL DATA:  Shortness of breath since cardioversion on Wednesday morning. Cough. History of atrial fibrillation, hypertension, diabetes. EXAM: CHEST  2 VIEW COMPARISON:  11/12/2016 FINDINGS: Shallow inspiration. Heart size and pulmonary vascularity are normal. Small bilateral pleural effusions. No airspace disease or consolidation in the lungs. Calcified granulomas in the left mid lung. Calcification of the aorta. Degenerative changes in the spine and shoulders. IMPRESSION: Small bilateral pleural effusions.  Aortic atherosclerosis. Electronically Signed   By: Lucienne Capers M.D.   On: 12/20/2016 02:58    Microbiology: No results found for this or any previous visit (from the past 240 hour(s)).   Labs: Basic Metabolic Panel:  Recent Labs Lab 12/20/16 0206 12/21/16 0630 12/22/16 0648  NA 135 138 138  K 4.1 3.9 3.5  CL 101 99* 96*  CO2 25 31 34*  GLUCOSE 168* 121* 130*  BUN 18 20 22*  CREATININE 1.20 1.17 1.46*  CALCIUM 8.7* 8.6* 8.4*   Liver Function Tests: No  results for input(s): AST, ALT, ALKPHOS, BILITOT, PROT, ALBUMIN in the last 168 hours. No results for input(s): LIPASE, AMYLASE in the last 168 hours. No results for input(s): AMMONIA in the last 168 hours. CBC:  Recent Labs Lab 12/20/16 0206 12/21/16 0630  WBC 8.2 5.1  NEUTROABS 6.3  --   HGB 12.2* 11.2*  HCT 36.7* 34.1*  MCV 90.2 91.9  PLT 153 156   Cardiac Enzymes:  Recent Labs Lab 12/20/16 0206  TROPONINI 0.03*   BNP: BNP (last 3 results)  Recent Labs  10/22/16 1100 11/12/16 0958 12/20/16 0206  BNP 406.0* 572.0* 808.0*    ProBNP (last 3 results) No results for input(s): PROBNP in the last 8760  hours.  CBG:  Recent Labs Lab 12/21/16 1125 12/21/16 1636 12/21/16 2120 12/22/16 0806 12/22/16 1213  GLUCAP 153* 113* 128* 117* 130*       Signed:  HERNANDEZ ACOSTA,ESTELA  [facility] Pager: [phone] 12/22/2016, 12:48 PM

## 2016-12-24 DIAGNOSIS — I482 Chronic atrial fibrillation: Secondary | ICD-10-CM | POA: Diagnosis not present

## 2016-12-24 DIAGNOSIS — I5032 Chronic diastolic (congestive) heart failure: Secondary | ICD-10-CM | POA: Diagnosis not present

## 2016-12-25 ENCOUNTER — Encounter: Payer: Self-pay | Admitting: Physician Assistant

## 2016-12-25 ENCOUNTER — Ambulatory Visit (INDEPENDENT_AMBULATORY_CARE_PROVIDER_SITE_OTHER): Payer: Medicare HMO | Admitting: Physician Assistant

## 2016-12-25 VITALS — BP 136/70 | HR 76 | Ht 72.0 in | Wt 201.0 lb

## 2016-12-25 DIAGNOSIS — N183 Chronic kidney disease, stage 3 unspecified: Secondary | ICD-10-CM

## 2016-12-25 DIAGNOSIS — I4891 Unspecified atrial fibrillation: Secondary | ICD-10-CM | POA: Diagnosis not present

## 2016-12-25 DIAGNOSIS — I5032 Chronic diastolic (congestive) heart failure: Secondary | ICD-10-CM

## 2016-12-25 DIAGNOSIS — I10 Essential (primary) hypertension: Secondary | ICD-10-CM

## 2016-12-25 NOTE — Progress Notes (Signed)
Cardiology Office Note    Date:  12/25/2016   ID:  Brett [name], DOB Mar 26, 1933, MRN [id number]  PCP:  Brett Gravel, MD  Cardiologist: Dr. Bronson Ing EPS: Dr. Caryl Comes  No chief complaint on file.   History of Present Illness:  Brett [name] is a 83 y.o. male with history of CAD status post PCIWith PTCA of the first diagonal in 2009 with residual 30-40% mid RCA 20-30% mid LAD ingrown Brett [name], chronic diastolic CHF, atrial fibrillation dating back to 2005 with amiodarone and cardioversions most recently repeated admissions over the past several months with A. fib RVR. He was treated with cardioversion and then placed on amiodarone but reverted back to atrial fibrillation. Diltiazem was previously discontinued metoprolol 50 mg twice a day was added back. CHADSVASC=4. Dr. Caryl Comes said that if he has recurrent atrial fibrillation AV junction ablation and pacing can Saturday. He had successful cardioversion 12/18/16. 12/19/16 he went to the ER with worsening shortness of breath. His diuretics had been stopped. He was given IV Lasix in the emergency room and felt much better. He diuresed 3.6 L. Echo 11/17/16 LVEF 50-55% with moderate LVH. Sent home on Lasix 40 mg daily 12/22/16.  Patient comes in today overall feeling better. His metoprolol was stopped by the hospitalist for unclear reasons. He says his heart rate was usually in the 70s but is now in the 80s and 90s. His breathing is better but he does have some ankle swelling. He admits to eating canned foods processed foods and had box mac & cheese last night.    Past Medical History:  Diagnosis Date  . Atrial fibrillation (Brett [name])    On coumadin  . Chronic kidney disease (CKD), stage III (moderate)   . Dysrhythmia    AFib  . Essential hypertension, benign   . History of cardiac catheterization    Details not certain - reportedly no interventions  . Hypothyroidism   . Macular degeneration   . Pulmonary hypertension (Brett [name]) 12/03/2013   PA pressure  56. Ejection fraction 55-60%  . Type 2 diabetes mellitus (Brett [name])     Past Surgical History:  Procedure Laterality Date  . CARDIOVERSION N/A 12/10/2013   Procedure: CARDIOVERSION;  Surgeon: Brett Commons, MD;  Location: [facility];  Service: Endoscopy;  Laterality: N/A;  . CARDIOVERSION N/A 11/15/2016   Procedure: CARDIOVERSION;  Surgeon: Brett Lenis, MD;  Location: [facility];  Service: Endoscopy;  Laterality: N/A;  . CARDIOVERSION N/A 12/18/2016   Procedure: CARDIOVERSION;  Surgeon: Brett Lenis, MD;  Location: [facility];  Service: Endoscopy;  Laterality: N/A;  pt knows to arrive at 8:00  . CATARACT EXTRACTION W/PHACO Left 11/13/2015   Procedure: CATARACT EXTRACTION PHACO AND INTRAOCULAR LENS PLACEMENT LEFT EYE CDE=7.76;  Surgeon: Brett Che, MD;  Location: [facility];  Service: Ophthalmology;  Laterality: Left;  . CHOLECYSTECTOMY    . COLONOSCOPY     Remote > 10 years ago  . ERCP N/A 10/17/2016   Dr. Gala Romney: sphincterotomy and stone extraction   . REMOVAL OF STONES  10/17/2016   Procedure: REMOVAL OF STONES with balloon;  Surgeon: Brett Dolin, MD;  Location: [facility];  Service: Endoscopy;;  . REPLACEMENT TOTAL HIP W/  RESURFACING IMPLANTS Bilateral   . SPHINCTEROTOMY  10/17/2016   Procedure: BILLARY SPHINCTEROTOMY with ballon dilation;  Surgeon: Brett Dolin, MD;  Location: [facility];  Service: Endoscopy;;  . TEE WITHOUT CARDIOVERSION N/A 12/10/2013   Procedure: TRANSESOPHAGEAL ECHOCARDIOGRAM (TEE);  Surgeon:  Brett Commons, MD;  Location: [facility];  Service: Endoscopy;  Laterality: N/A;    Current Medications: Current Meds  Medication Sig  . albuterol (PROVENTIL HFA;VENTOLIN HFA) 108 (90 Base) MCG/ACT inhaler Inhale 1-2 puffs into the lungs every 6 (six) hours as needed for wheezing or shortness of breath.  Brett [name] amiodarone (PACERONE) 200 MG tablet Take 200 mg by mouth daily.  Brett [name] aspirin EC 81 MG EC tablet Take 1 tablet (81 mg total) by mouth daily.  Brett [name] doxazosin  (CARDURA) 2 MG tablet Take 1 tablet (2 mg total) by mouth at bedtime.  . furosemide (LASIX) 40 MG tablet Take 1 tablet (40 mg total) by mouth daily.  Brett [name] levothyroxine (SYNTHROID, LEVOTHROID) 50 MCG tablet Take 50 mcg by mouth daily before breakfast.   . MELATONIN PO Take 4 mg by mouth at bedtime. 2 mg tablets  . metFORMIN (GLUCOPHAGE) 500 MG tablet Take 1 tablet (500 mg total) by mouth 2 (two) times daily with a meal.  . warfarin (COUMADIN) 3 MG tablet Take 1 tablet (3 mg total) by mouth daily at 6 PM.     Allergies:   Patient has no known allergies.   Social History   Social History  . Marital status: Single    Spouse name: N/A  . Number of children: N/A  . Years of education: N/A   Social History Main Topics  . Smoking status: Former Smoker    Packs/day: 1.00    Years: 37.00    Types: Cigarettes    Start date: 11/01/1952    Quit date: 08/18/1978  . Smokeless tobacco: Never Used  . Alcohol use 0.0 oz/week     Comment: Occasional  . Drug use: No  . Sexual activity: Not Currently   Other Topics Concern  . None   Social History Narrative  . None     Family History:  The patient's family history includes Hypertension in his father and mother.   ROS:   Please see the history of present illness.    Review of Systems  Constitution: Positive for weakness and malaise/fatigue.  HENT: Negative.   Cardiovascular: Positive for dyspnea on exertion and leg swelling.  Respiratory: Negative.   Endocrine: Negative.   Hematologic/Lymphatic: Negative.   Musculoskeletal: Negative.   Gastrointestinal: Negative.   Genitourinary: Negative.    All other systems reviewed and are negative.   PHYSICAL EXAM:   VS:  BP 136/70   Pulse 76   Ht 6' (1.829 m)   Wt 201 lb (91.2 kg)   SpO2 95%   BMI 27.26 kg/m   Physical Exam  GEN: Well nourished, well developed, in no acute distress  Neck: no JVD, carotid bruits, or masses DZHGDJM:EQA;8/3 systolic murmur at the left sternal border  Respiratory: Decreased breath sounds but clear without rales GI: soft, nontender, nondistended, + BS Ext: without cyanosis, clubbing, or edema, Good distal pulses bilaterally Neuro:  Alert and Oriented x 3 Psych: euthymic mood, full affect  Wt Readings from Last 3 Encounters:  12/25/16 201 lb (91.2 kg)  12/22/16 199 lb 14.4 oz (90.7 kg)  12/12/16 210 lb (95.3 kg)      Studies/Labs Reviewed:   EKG:  EKG is ordered today.  The ekg ordered today demonstrates Normal sinus rhythm with first-degree AV block  Recent Labs: 10/31/2016: Magnesium 2.1 11/12/2016: TSH 2.228 11/13/2016: ALT 22 12/20/2016: B Natriuretic Peptide 808.0 12/21/2016: Hemoglobin 11.2; Platelets 156 12/22/2016: BUN 22; Creatinine, Ser 1.46; Potassium 3.5; Sodium 138   Lipid Panel  Component Value Date/Time   CHOL 158 10/18/2016 0558   TRIG 108 10/18/2016 0558   HDL 45 10/18/2016 0558   CHOLHDL 3.5 10/18/2016 0558   VLDL 22 10/18/2016 0558   LDLCALC 91 10/18/2016 0558    Additional studies/ records that were reviewed today include:  2-D echo 8/2/18Study Conclusions   - Left ventricle: The cavity size was normal. Wall thickness was   increased in a pattern of moderate LVH. Systolic function was   normal. The estimated ejection fraction was in the range of 50%   to 55%. - Aortic valve: There was mild to moderate regurgitation. Valve   area (VTI): 3.42 cm^2. Valve area (Vmax): 3.76 cm^2. - Mitral valve: Mildly to moderately calcified annulus. Normal   thickness leaflets . - Left atrium: The atrium was moderately to severely dilated. - Right ventricle: The cavity size was mildly dilated. Systolic   function was mildly reduced. - Right atrium: The atrium was moderately dilated. - Atrial septum: No defect or patent foramen ovale was identified. - Inferior vena cava: The vessel was dilated. The respirophasic   diameter changes were blunted (< 50%), consistent with elevated   central venous pressure. - Technically  adequate study.       ASSESSMENT:    1. Atrial fibrillation with RVR ([place])   2. Chronic diastolic heart failure ([place])   3. Essential hypertension   4. Chronic kidney disease (CKD), stage III (moderate)      PLAN:  In order of problems listed above:  Atrial fibrillation with RVR now on amiodarone and holding normal sinus rhythm but long first degree AV block. Metoprolol was stopped in the hospital by hospitalist for unclear reasons. With long first-degree AV block will hold off on restarting. Follow-up with Dr. Bronson Ing in 3-4 weeks. If he has recurrent atrial fibrillation Dr. Caryl Comes recommends AV ablation and pacemaker. Continue Coumadin  Chronic diastolic CHF with recent hospitalization for such. Lasix had been stopped because of renal insufficiency and is now restarted. Patient does eat a high salt diet. Discussed 2 g sodium diet. He is scheduled for blood work by primary care next Wednesday and I've asked for those results. Last creatinine 1.46 12/22/16.   Essential hypertension patient's blood pressure is stable on Cardura  CK D stage III recheck renal function next week   Medication Adjustments/Labs and Tests Ordered: Current medicines are reviewed at length with the patient today.  Concerns regarding medicines are outlined above.  Medication changes, Labs and Tests ordered today are listed in the Patient Instructions below. Patient Instructions  Medication Instructions:  Your physician recommends that you continue on your current medications as directed. Please refer to the Current Medication list given to you today.   Labwork: NONE  Testing/Procedures: NONE  Follow-Up: Your physician recommends that you schedule a follow-up appointment in: 2-3 Weeks    Any Other Special Instructions Will Be Listed Below (If Applicable).     If you need a refill on your cardiac medications before your next appointment, please call your pharmacy. Thank you for choosing [facility]!       Signed, Ermalinda Barrios, PA-C  12/25/2016 1:35 PM    [facility] [address], [place], [place]  [address] Phone: [phone]; Fax: [phone]

## 2016-12-25 NOTE — Patient Instructions (Signed)
Medication Instructions:  Your physician recommends that you continue on your current medications as directed. Please refer to the Current Medication list given to you today.   Labwork: NONE  Testing/Procedures: NONE  Follow-Up: Your physician recommends that you schedule a follow-up appointment in: 2-3 Weeks    Any Other Special Instructions Will Be Listed Below (If Applicable).     If you need a refill on your cardiac medications before your next appointment, please call your pharmacy. Thank you for choosing [facility]!

## 2016-12-30 DIAGNOSIS — I10 Essential (primary) hypertension: Secondary | ICD-10-CM | POA: Diagnosis not present

## 2016-12-30 DIAGNOSIS — I482 Chronic atrial fibrillation: Secondary | ICD-10-CM | POA: Diagnosis not present

## 2017-01-01 DIAGNOSIS — E118 Type 2 diabetes mellitus with unspecified complications: Secondary | ICD-10-CM | POA: Diagnosis not present

## 2017-01-01 DIAGNOSIS — I5032 Chronic diastolic (congestive) heart failure: Secondary | ICD-10-CM | POA: Diagnosis not present

## 2017-01-01 DIAGNOSIS — I482 Chronic atrial fibrillation: Secondary | ICD-10-CM | POA: Diagnosis not present

## 2017-01-01 DIAGNOSIS — I10 Essential (primary) hypertension: Secondary | ICD-10-CM | POA: Diagnosis not present

## 2017-01-13 ENCOUNTER — Ambulatory Visit (INDEPENDENT_AMBULATORY_CARE_PROVIDER_SITE_OTHER): Payer: Medicare HMO | Admitting: Cardiovascular Disease

## 2017-01-13 ENCOUNTER — Encounter: Payer: Self-pay | Admitting: Cardiovascular Disease

## 2017-01-13 VITALS — BP 124/80 | HR 91 | Ht 72.0 in | Wt 203.0 lb

## 2017-01-13 DIAGNOSIS — I481 Persistent atrial fibrillation: Secondary | ICD-10-CM | POA: Diagnosis not present

## 2017-01-13 DIAGNOSIS — I5032 Chronic diastolic (congestive) heart failure: Secondary | ICD-10-CM

## 2017-01-13 DIAGNOSIS — I4811 Longstanding persistent atrial fibrillation: Secondary | ICD-10-CM

## 2017-01-13 DIAGNOSIS — E782 Mixed hyperlipidemia: Secondary | ICD-10-CM

## 2017-01-13 DIAGNOSIS — I10 Essential (primary) hypertension: Secondary | ICD-10-CM

## 2017-01-13 DIAGNOSIS — I440 Atrioventricular block, first degree: Secondary | ICD-10-CM

## 2017-01-13 DIAGNOSIS — I482 Chronic atrial fibrillation: Secondary | ICD-10-CM | POA: Diagnosis not present

## 2017-01-13 MED ORDER — METOPROLOL TARTRATE 25 MG PO TABS: 25.0000 mg | ORAL_TABLET | Freq: Two times a day (BID) | ORAL | 3 refills | Status: DC

## 2017-01-13 NOTE — Progress Notes (Signed)
SUBJECTIVE: The patient returns for routine follow up. He underwent direct current cardioversion on 12/18/16.  He saw Dr. Caryl Comes (EP) on 11/27/16 who mentioned that if there were recurrent a fib, AV junction ablation and pacing could cbe considered.  He also has CAD and chronic diastolic heart failure.  Metoprolol stopped by hospitalist on 12/22/16 for unclear reasons.  He feels well today. He monitors his heart rate and blood pressure and heart rates have been ranging in the 80-90 beat per minute range whereas they had previously been under 70 bpm.  He denies chest pain and shortness of breath. He has some bilateral ankle swelling by the end of the day after he has been up on his feet.  ECG performed on 12/25/16 which are percent interpreted demonstrated sinus rhythm with first degree AV block, PR 342 ms.   Review of Systems: As per "subjective", otherwise negative.  No Known Allergies  Current Outpatient Prescriptions  Medication Sig Dispense Refill  . albuterol (PROVENTIL HFA;VENTOLIN HFA) 108 (90 Base) MCG/ACT inhaler Inhale 1-2 puffs into the lungs every 6 (six) hours as needed for wheezing or shortness of breath.    Marland Kitchen amiodarone (PACERONE) 200 MG tablet Take 200 mg by mouth daily.    Marland Kitchen aspirin EC 81 MG EC tablet Take 1 tablet (81 mg total) by mouth daily.    Marland Kitchen doxazosin (CARDURA) 2 MG tablet Take 1 tablet (2 mg total) by mouth at bedtime. 90 tablet 3  . furosemide (LASIX) 40 MG tablet Take 1 tablet (40 mg total) by mouth daily. 30 tablet 2  . levothyroxine (SYNTHROID, LEVOTHROID) 50 MCG tablet Take 50 mcg by mouth daily before breakfast.     . MELATONIN PO Take 4 mg by mouth at bedtime. 2 mg tablets    . metFORMIN (GLUCOPHAGE) 500 MG tablet Take 1 tablet (500 mg total) by mouth 2 (two) times daily with a meal. 180 tablet 1  . warfarin (COUMADIN) 3 MG tablet Take 1 tablet (3 mg total) by mouth daily at 6 PM. (Patient taking differently: Take 3 mg by mouth daily at 6 PM. 1/2 tablet  on Monday, Thursday) 30 tablet 0   No current facility-administered medications for this visit.     Past Medical History:  Diagnosis Date  . Atrial fibrillation ([place])    On coumadin  . Chronic kidney disease (CKD), stage III (moderate) (HCC)   . Dysrhythmia    AFib  . Essential hypertension, benign   . History of cardiac catheterization    Details not certain - reportedly no interventions  . Hypothyroidism   . Macular degeneration   . Pulmonary hypertension ([place]) 12/03/2013   PA pressure 56. Ejection fraction 55-60%  . Type 2 diabetes mellitus ([place])     Past Surgical History:  Procedure Laterality Date  . CARDIOVERSION N/A 12/10/2013   Procedure: CARDIOVERSION;  Surgeon: Herminio Commons, MD;  Location: [facility];  Service: Endoscopy;  Laterality: N/A;  . CARDIOVERSION N/A 11/15/2016   Procedure: CARDIOVERSION;  Surgeon: Arnoldo Lenis, MD;  Location: [facility];  Service: Endoscopy;  Laterality: N/A;  . CARDIOVERSION N/A 12/18/2016   Procedure: CARDIOVERSION;  Surgeon: Arnoldo Lenis, MD;  Location: [facility];  Service: Endoscopy;  Laterality: N/A;  pt knows to arrive at 8:00  . CATARACT EXTRACTION W/PHACO Left 11/13/2015   Procedure: CATARACT EXTRACTION PHACO AND INTRAOCULAR LENS PLACEMENT LEFT EYE CDE=7.76;  Surgeon: Williams Che, MD;  Location: [facility];  Service: Ophthalmology;  Laterality: Left;  . CHOLECYSTECTOMY    . COLONOSCOPY     Remote > 10 years ago  . ERCP N/A 10/17/2016   Dr. Gala Romney: sphincterotomy and stone extraction   . REMOVAL OF STONES  10/17/2016   Procedure: REMOVAL OF STONES with balloon;  Surgeon: Daneil Dolin, MD;  Location[facility];  Service: Endoscopy;;  . REPLACEMENT TOTAL HIP W/  RESURFACING IMPLANTS Bilateral   . SPHINCTEROTOMY  10/17/2016   Procedure: BILLARY SPHINCTEROTOMY with ballon dilation;  Surgeon: Daneil Dolin, MD;  [facility];  Service: Endoscopy;;  . TEE WITHOUT CARDIOVERSION N/A 12/10/2013   Procedure:  TRANSESOPHAGEAL ECHOCARDIOGRAM (TEE);  Surgeon: Herminio Commons, MD;  Location: [facility];  Service: Endoscopy;  Laterality: N/A;    Social History   Social History  . Marital status: Single    Spouse name: N/A  . Number of children: N/A  . Years of education: N/A   Occupational History  . Not on file.   Social History Main Topics  . Smoking status: Former Smoker    Packs/day: 1.00    Years: 37.00    Types: Cigarettes    Start date: 11/01/1952    Quit date: 08/18/1978  . Smokeless tobacco: Never Used  . Alcohol use 0.0 oz/week     Comment: Occasional  . Drug use: No  . Sexual activity: Not Currently   Other Topics Concern  . Not on file   Social History Narrative  . No narrative on file     Vitals:   01/13/17 1254  BP: 124/80  Pulse: 91  SpO2: 97%  Weight: 203 lb (92.1 kg)  Height: 6' (1.829 m)    Wt Readings from Last 3 Encounters:  01/13/17 203 lb (92.1 kg)  12/25/16 201 lb (91.2 kg)  12/22/16 199 lb 14.4 oz (90.7 kg)     PHYSICAL EXAM General: NAD HEENT: Normal. Neck: No JVD, no thyromegaly. Lungs: Clear to auscultation bilaterally with normal respiratory effort. CV: Nondisplaced PMI.  Regular rate and rhythm, normal S1/S2, no S3/S4, no murmur. No pretibial or periankle edema.  No carotid bruit.   Abdomen: Soft, nontender, no distention.  Neurologic: Alert and oriented.  Psych: Normal affect. Skin: Normal. Musculoskeletal: No gross deformities.    ECG: Most recent ECG reviewed.   Labs: Lab Results  Component Value Date/Time   K 3.5 12/22/2016 06:48 AM   BUN 22 (H) 12/22/2016 06:48 AM   CREATININE 1.46 (H) 12/22/2016 06:48 AM   ALT 22 11/13/2016 04:35 AM   TSH 2.228 11/12/2016 10:06 AM   TSH 3.130 12/01/2013 08:47 PM   HGB 11.2 (L) 12/21/2016 06:30 AM     Lipids: Lab Results  Component Value Date/Time   LDLCALC 91 10/18/2016 05:58 AM   CHOL 158 10/18/2016 05:58 AM   TRIG 108 10/18/2016 05:58 AM   HDL 45 10/18/2016 05:58 AM        ASSESSMENT AND PLAN: 1. Longstanding persistent atrial fibrillation: Symptomatically stable. Currently on amiodarone and warfarin. Metoprolol stopped by hospitalist on 12/22/16 for unclear reasons. ECG on 12/25/16 shows marked first-degree AV block, PR interval 342 ms. I will restart metoprolol 25 mg twice daily.  2. Chronic diastolic heart failure: Euvolemic. No changes to therapy. Continue Lasix 40 mg daily.  3. CAD: Stable ischemic heart disease. Symptoms medically stable. Continue aspirin and amiodarone. No longer on pravastatin. I will restart metoprolol.  4. Hypertension: Controlled. Monitor given reinstitution of metoprolol.  5. First degree AV block: PR  342 ms on 12/25/16. I will monitor given reinstitution of metoprolol.   Disposition: Follow up 3 months.    Kate Sable, M.D., F.A.C.C.

## 2017-01-13 NOTE — Patient Instructions (Signed)
Medication Instructions:  START METOPROLOL 25 MG TWO TIMES DAILY  Labwork: NONE  Testing/Procedures: NONE  Follow-Up: Your physician recommends that you schedule a follow-up appointment in: 3 MONTHS    Any Other Special Instructions Will Be Listed Below (If Applicable).     If you need a refill on your cardiac medications before your next appointment, please call your pharmacy.

## 2017-01-21 DIAGNOSIS — I480 Paroxysmal atrial fibrillation: Secondary | ICD-10-CM | POA: Diagnosis not present

## 2017-01-23 DIAGNOSIS — Z85820 Personal history of malignant melanoma of skin: Secondary | ICD-10-CM | POA: Diagnosis not present

## 2017-01-23 DIAGNOSIS — L570 Actinic keratosis: Secondary | ICD-10-CM | POA: Diagnosis not present

## 2017-01-23 DIAGNOSIS — Z1283 Encounter for screening for malignant neoplasm of skin: Secondary | ICD-10-CM | POA: Diagnosis not present

## 2017-01-23 DIAGNOSIS — Z08 Encounter for follow-up examination after completed treatment for malignant neoplasm: Secondary | ICD-10-CM | POA: Diagnosis not present

## 2017-01-23 DIAGNOSIS — X32XXXD Exposure to sunlight, subsequent encounter: Secondary | ICD-10-CM | POA: Diagnosis not present

## 2017-01-23 DIAGNOSIS — L821 Other seborrheic keratosis: Secondary | ICD-10-CM | POA: Diagnosis not present

## 2017-02-17 DIAGNOSIS — I480 Paroxysmal atrial fibrillation: Secondary | ICD-10-CM | POA: Diagnosis not present

## 2017-02-18 DIAGNOSIS — I480 Paroxysmal atrial fibrillation: Secondary | ICD-10-CM | POA: Diagnosis not present

## 2017-03-03 DIAGNOSIS — I480 Paroxysmal atrial fibrillation: Secondary | ICD-10-CM | POA: Diagnosis not present

## 2017-03-11 DIAGNOSIS — I482 Chronic atrial fibrillation: Secondary | ICD-10-CM | POA: Diagnosis not present

## 2017-03-27 DIAGNOSIS — I440 Atrioventricular block, first degree: Secondary | ICD-10-CM | POA: Diagnosis not present

## 2017-03-27 DIAGNOSIS — I10 Essential (primary) hypertension: Secondary | ICD-10-CM | POA: Diagnosis not present

## 2017-03-27 DIAGNOSIS — I5032 Chronic diastolic (congestive) heart failure: Secondary | ICD-10-CM | POA: Diagnosis not present

## 2017-03-27 DIAGNOSIS — R0600 Dyspnea, unspecified: Secondary | ICD-10-CM | POA: Diagnosis not present

## 2017-03-27 DIAGNOSIS — I482 Chronic atrial fibrillation: Secondary | ICD-10-CM | POA: Diagnosis not present

## 2017-03-27 DIAGNOSIS — Z79899 Other long term (current) drug therapy: Secondary | ICD-10-CM | POA: Diagnosis not present

## 2017-03-27 DIAGNOSIS — J449 Chronic obstructive pulmonary disease, unspecified: Secondary | ICD-10-CM | POA: Diagnosis not present

## 2017-04-01 DIAGNOSIS — I482 Chronic atrial fibrillation: Secondary | ICD-10-CM | POA: Diagnosis not present

## 2017-04-01 DIAGNOSIS — G4730 Sleep apnea, unspecified: Secondary | ICD-10-CM | POA: Diagnosis not present

## 2017-04-01 DIAGNOSIS — I5032 Chronic diastolic (congestive) heart failure: Secondary | ICD-10-CM | POA: Diagnosis not present

## 2017-04-01 DIAGNOSIS — Z79899 Other long term (current) drug therapy: Secondary | ICD-10-CM | POA: Diagnosis not present

## 2017-04-01 DIAGNOSIS — Z6829 Body mass index (BMI) 29.0-29.9, adult: Secondary | ICD-10-CM | POA: Diagnosis not present

## 2017-04-02 DIAGNOSIS — G4730 Sleep apnea, unspecified: Secondary | ICD-10-CM | POA: Diagnosis not present

## 2017-04-10 DIAGNOSIS — Z5181 Encounter for therapeutic drug level monitoring: Secondary | ICD-10-CM | POA: Diagnosis not present

## 2017-04-10 DIAGNOSIS — I5032 Chronic diastolic (congestive) heart failure: Secondary | ICD-10-CM | POA: Diagnosis not present

## 2017-04-10 DIAGNOSIS — E119 Type 2 diabetes mellitus without complications: Secondary | ICD-10-CM | POA: Diagnosis not present

## 2017-04-10 DIAGNOSIS — I482 Chronic atrial fibrillation: Secondary | ICD-10-CM | POA: Diagnosis not present

## 2017-04-14 ENCOUNTER — Other Ambulatory Visit: Payer: Self-pay

## 2017-04-14 ENCOUNTER — Encounter (HOSPITAL_COMMUNITY): Payer: Self-pay | Admitting: Emergency Medicine

## 2017-04-14 ENCOUNTER — Emergency Department (HOSPITAL_COMMUNITY): Payer: Medicare HMO

## 2017-04-14 ENCOUNTER — Emergency Department (HOSPITAL_COMMUNITY)
Admission: EM | Admit: 2017-04-14 | Discharge: 2017-04-14 | Disposition: A | Discharge status: Home / Self Care | Payer: Medicare HMO | Attending: Emergency Medicine | Admitting: Emergency Medicine

## 2017-04-14 DIAGNOSIS — N183 Chronic kidney disease, stage 3 (moderate): Secondary | ICD-10-CM | POA: Diagnosis not present

## 2017-04-14 DIAGNOSIS — I5032 Chronic diastolic (congestive) heart failure: Secondary | ICD-10-CM | POA: Insufficient documentation

## 2017-04-14 DIAGNOSIS — Z79899 Other long term (current) drug therapy: Secondary | ICD-10-CM | POA: Diagnosis not present

## 2017-04-14 DIAGNOSIS — R0602 Shortness of breath: Secondary | ICD-10-CM | POA: Diagnosis not present

## 2017-04-14 DIAGNOSIS — E1122 Type 2 diabetes mellitus with diabetic chronic kidney disease: Secondary | ICD-10-CM | POA: Diagnosis not present

## 2017-04-14 DIAGNOSIS — Z87891 Personal history of nicotine dependence: Secondary | ICD-10-CM | POA: Diagnosis not present

## 2017-04-14 DIAGNOSIS — Z7984 Long term (current) use of oral hypoglycemic drugs: Secondary | ICD-10-CM | POA: Diagnosis not present

## 2017-04-14 DIAGNOSIS — R0600 Dyspnea, unspecified: Secondary | ICD-10-CM | POA: Insufficient documentation

## 2017-04-14 DIAGNOSIS — I4891 Unspecified atrial fibrillation: Secondary | ICD-10-CM | POA: Diagnosis not present

## 2017-04-14 DIAGNOSIS — N179 Acute kidney failure, unspecified: Secondary | ICD-10-CM

## 2017-04-14 DIAGNOSIS — E039 Hypothyroidism, unspecified: Secondary | ICD-10-CM | POA: Diagnosis not present

## 2017-04-14 DIAGNOSIS — D649 Anemia, unspecified: Secondary | ICD-10-CM | POA: Insufficient documentation

## 2017-04-14 DIAGNOSIS — Z7982 Long term (current) use of aspirin: Secondary | ICD-10-CM | POA: Insufficient documentation

## 2017-04-14 DIAGNOSIS — R531 Weakness: Secondary | ICD-10-CM | POA: Diagnosis present

## 2017-04-14 DIAGNOSIS — R7989 Other specified abnormal findings of blood chemistry: Secondary | ICD-10-CM | POA: Diagnosis not present

## 2017-04-14 DIAGNOSIS — I130 Hypertensive heart and chronic kidney disease with heart failure and stage 1 through stage 4 chronic kidney disease, or unspecified chronic kidney disease: Secondary | ICD-10-CM | POA: Diagnosis not present

## 2017-04-14 DIAGNOSIS — Z7901 Long term (current) use of anticoagulants: Secondary | ICD-10-CM | POA: Diagnosis not present

## 2017-04-14 LAB — PROTIME-INR
INR: 2.19
Prothrombin Time: 24.1 seconds — ABNORMAL HIGH (ref 11.4–15.2)

## 2017-04-14 LAB — BASIC METABOLIC PANEL
Anion gap: 12 (ref 5–15)
BUN: 25 mg/dL — AB (ref 6–20)
CHLORIDE: 103 mmol/L (ref 101–111)
CO2: 28 mmol/L (ref 22–32)
CREATININE: 1.73 mg/dL — AB (ref 0.61–1.24)
Calcium: 8.9 mg/dL (ref 8.9–10.3)
GFR calc Af Amer: 41 mL/min — ABNORMAL LOW (ref >60)
GFR calc non Af Amer: 35 mL/min — ABNORMAL LOW (ref >60)
GLUCOSE: 101 mg/dL — AB (ref 65–99)
Potassium: 4.2 mmol/L (ref 3.5–5.1)
SODIUM: 143 mmol/L (ref 135–145)

## 2017-04-14 LAB — TROPONIN I
TROPONIN I: 0.03 ng/mL — AB (ref <0.03)
Troponin I: 0.03 ng/mL (ref <0.03)

## 2017-04-14 LAB — CBC
HCT: 32.7 % — ABNORMAL LOW (ref 39.0–52.0)
Hemoglobin: 9.7 g/dL — ABNORMAL LOW (ref 13.0–17.0)
MCH: 26.6 pg (ref 26.0–34.0)
MCHC: 29.7 g/dL — AB (ref 30.0–36.0)
MCV: 89.6 fL (ref 78.0–100.0)
PLATELETS: 223 10*3/uL (ref 150–400)
RBC: 3.65 MIL/uL — ABNORMAL LOW (ref 4.22–5.81)
RDW: 13.8 % (ref 11.5–15.5)
WBC: 4.5 10*3/uL (ref 4.0–10.5)

## 2017-04-14 LAB — BRAIN NATRIURETIC PEPTIDE: B Natriuretic Peptide: 720 pg/mL — ABNORMAL HIGH (ref 0.0–100.0)

## 2017-04-14 LAB — POC OCCULT BLOOD, ED: FECAL OCCULT BLD: NEGATIVE

## 2017-04-14 LAB — TSH: TSH: 5.535 u[IU]/mL — AB (ref 0.350–4.500)

## 2017-04-14 MED ORDER — FERROUS SULFATE 325 (65 FE) MG PO TABS: 325.0000 mg | ORAL_TABLET | Freq: Every day | ORAL | 1 refills | Status: AC

## 2017-04-14 NOTE — ED Triage Notes (Signed)
Pt reports was called by PCP office this am and was told to come to ED due to abnormal BNP, INR, A1C. Pt denies pain at this time. Pt reports shortness of breath with walking. nad noted.

## 2017-04-14 NOTE — ED Provider Notes (Signed)
[facility] EMERGENCY DEPARTMENT Provider Note   CSN: [id number] Arrival date & time: 04/14/17  1058     History   Chief Complaint Chief Complaint  Patient presents with  . Abnormal Lab    HPI Brett [name] is a 83 y.o. male.  Patient presents with weakness and dyspnea with exertion for unknown period of time.  Recent labs were drawn by his primary care doctor this past Friday and he was instructed to come to the emergency department because of abnormal results.  No present chest pain, dyspnea, fever, sweats, chills, dysuria.  Primary care doctor is Dr. [name].  Past medical history includes atrial fibrillation, hypertension, diabetes, chronic kidney disease, hypothyroidism      Past Medical History:  Diagnosis Date  . Atrial fibrillation ([place])    On coumadin  . Chronic kidney disease (CKD), stage III (moderate) (HCC)   . Dysrhythmia    AFib  . Essential hypertension, benign   . History of cardiac catheterization    Details not certain - reportedly no interventions  . Hypothyroidism   . Macular degeneration   . Pulmonary hypertension ([place]) 12/03/2013   PA pressure 56. Ejection fraction 55-60%  . Type 2 diabetes mellitus [facility])     Patient Active Problem List   Diagnosis Date Noted  . CHF exacerbation ([place]) 12/20/2016  . Acute congestive heart failure ([place])   . Acute on chronic diastolic CHF (congestive heart failure) ([place]) 10/24/2016  . Jaundice   . Periumbilical pain   . Elevated lipase   . Coagulopathy ([place]) 10/16/2016  . Elevated liver enzymes   . Hyperbilirubinemia   . Hyponatremia   . Abdominal pain 10/15/2016  . A-fib ([place]) 02/23/2014  . Diabetes ([place]) 02/23/2014  . BP (high blood pressure) 02/23/2014  . HLD (hyperlipidemia) 02/23/2014  . Excess weight 02/23/2014  . Chronic diastolic heart failure ([place]) 12/24/2013  . Acute bronchitis 12/06/2013  . DIC (disseminated intravascular coagulation) ([place]) 12/04/2013  . Thrombocytopenia, unspecified  ([place]) 12/03/2013  . Pulmonary hypertension ([place]) 12/03/2013  . Wheezing 12/02/2013  . Obesity, morbid ([place]) 12/02/2013  . Transaminitis 12/02/2013  . Type 2 diabetes mellitus with diabetic nephropathy ([place]) 12/01/2013  . Hypertension 12/01/2013  . Atrial fibrillation with RVR ([place]) 12/01/2013  . Gall stones, common bile duct 12/01/2013  . Pancreatitis due to biliary obstruction 12/01/2013  . Chronic kidney disease (CKD), stage III (moderate) ([place]) 12/01/2013  . Hypothyroidism 12/01/2013  . Cholelithiasis 12/01/2013  . History of other specified conditions presenting hazards to health 02/09/2008  . Lumbar back sprain 02/09/2008  . Herpes zoster keratoconjunctivitis 09/15/2007  . Acute systolic heart failure ([place]) 08/20/2007  . PNA (pneumonia) 08/20/2007  . Fast heart beat 08/20/2007  . Feeling bilious 08/16/2007    Past Surgical History:  Procedure Laterality Date  . CARDIOVERSION N/A 12/10/2013   Procedure: CARDIOVERSION;  Surgeon: Herminio Commons, MD;  Location: [facility];  Service: Endoscopy;  Laterality: N/A;  . CARDIOVERSION N/A 11/15/2016   Procedure: CARDIOVERSION;  Surgeon: Arnoldo Lenis, MD;  Location: [facility];  Service: Endoscopy;  Laterality: N/A;  . CARDIOVERSION N/A 12/18/2016   Procedure: CARDIOVERSION;  Surgeon: Arnoldo Lenis, MD;  [facility];  Service: Endoscopy;  Laterality: N/A;  pt knows to arrive at 8:00  . CATARACT EXTRACTION W/PHACO Left 11/13/2015   Procedure: CATARACT EXTRACTION PHACO AND INTRAOCULAR LENS PLACEMENT LEFT EYE CDE=7.76;  Surgeon: Williams Che, MD;  Location: [facility];  Service: Ophthalmology;  Laterality: Left;  .  CHOLECYSTECTOMY    . COLONOSCOPY     Remote > 10 years ago  . ERCP N/A 10/17/2016   Dr. Gala Romney: sphincterotomy and stone extraction   . REMOVAL OF STONES  10/17/2016   Procedure: REMOVAL OF STONES with balloon;  Surgeon: Daneil Dolin, MD;  [facility];  Service: Endoscopy;;  . REPLACEMENT TOTAL HIP W/   RESURFACING IMPLANTS Bilateral   . SPHINCTEROTOMY  10/17/2016   Procedure: BILLARY SPHINCTEROTOMY with ballon dilation;  Surgeon: Daneil Dolin, MD;  [facility];  Service: Endoscopy;;  . TEE WITHOUT CARDIOVERSION N/A 12/10/2013   Procedure: TRANSESOPHAGEAL ECHOCARDIOGRAM (TEE);  Surgeon: Herminio Commons, MD;  Location: [facility];  Service: Endoscopy;  Laterality: N/A;       Home Medications    Prior to Admission medications   Medication Sig Start Date End Date Taking? Authorizing Provider  albuterol (PROVENTIL HFA;VENTOLIN HFA) 108 (90 Base) MCG/ACT inhaler Inhale 1-2 puffs into the lungs every 6 (six) hours as needed for wheezing or shortness of breath.   Yes [provider]  amiodarone (PACERONE) 200 MG tablet Take 200 mg by mouth daily.   Yes [provider]  aspirin EC 81 MG EC tablet Take 1 tablet (81 mg total) by mouth daily. 12/23/16  Yes Isaac Bliss, Rayford Halsted, MD  diphenhydrAMINE (BENADRYL) 12.5 MG/5ML elixir Take 50 mg by mouth at bedtime as needed for allergies or sleep.   Yes [provider]  doxazosin (CARDURA) 2 MG tablet Take 1 tablet (2 mg total) by mouth at bedtime. 04/24/15  Yes Herminio Commons, MD  furosemide (LASIX) 40 MG tablet Take 1 tablet (40 mg total) by mouth daily. 12/22/16 04/14/17 Yes Erline Hau, MD  levothyroxine (SYNTHROID, LEVOTHROID) 50 MCG tablet Take 50 mcg by mouth daily before breakfast.    Yes [provider]  MELATONIN PO Take 4 mg by mouth at bedtime. 2 mg tablets   Yes [provider]  metFORMIN (GLUCOPHAGE) 500 MG tablet Take 1 tablet (500 mg total) by mouth 2 (two) times daily with a meal. 11/16/16  Yes Orvan Falconer, MD  metoprolol tartrate (LOPRESSOR) 25 MG tablet Take 1 tablet (25 mg total) by mouth 2 (two) times daily. Patient taking differently: Take 50 mg by mouth 2 (two) times daily.  01/13/17 04/14/17 Yes Herminio Commons, MD  warfarin (COUMADIN) 3 MG tablet Take 1  tablet (3 mg total) by mouth daily at 6 PM. Patient taking differently: Take 3 mg by mouth daily at 6 PM. 1/2 tablet on Monday, Thursday 10/20/16  Yes Tat, Shanon Brow, MD  B Complex-C-Folic Acid (NEPHRO-VITE PO) Take 1 tablet by mouth daily.    [provider]  ferrous sulfate 325 (65 FE) MG tablet Take 1 tablet (325 mg total) by mouth daily. 04/14/17   Nat Christen, MD    Family History Family History  Problem Relation Age of Onset  . Hypertension Mother   . Hypertension Father   . Colon cancer Neg Hx     Social History Social History   Tobacco Use  . Smoking status: Former Smoker    Packs/day: 1.00    Years: 37.00    Pack years: 37.00    Types: Cigarettes    Start date: 11/01/1952    Last attempt to quit: 08/18/1978    Years since quitting: 38.6  . Smokeless tobacco: Never Used  Substance Use Topics  . Alcohol use: Yes    Alcohol/week: 0.0 oz  Comment: Occasional  . Drug use: No     Allergies   Patient has no known allergies.   Review of Systems Review of Systems  All other systems reviewed and are negative.    Physical Exam Updated Vital Signs BP (!) 156/67   Pulse (!) 56   Temp 97.8 F (36.6 C) (Oral)   Resp 18   Ht 6' (1.829 m)   Wt 93 kg (205 lb)   SpO2 93%   BMI 27.80 kg/m   Physical Exam  Constitutional: He is oriented to person, place, and time. He appears well-developed and well-nourished.  HENT:  Head: Normocephalic and atraumatic.  Eyes: Conjunctivae are normal.  Neck: Neck supple.  Cardiovascular: Normal rate and regular rhythm.  Pulmonary/Chest: Effort normal and breath sounds normal.  Abdominal: Soft. Bowel sounds are normal.  Genitourinary:  Genitourinary Comments: Rectal exam: No masses, heme negative.  Musculoskeletal: Normal range of motion.  Neurological: He is alert and oriented to person, place, and time.  Skin: Skin is warm and dry.  Psychiatric: He has a normal mood and affect. His behavior is normal.  Nursing note and  vitals reviewed.    ED Treatments / Results  Labs (all labs ordered are listed, but only abnormal results are displayed) Labs Reviewed  BASIC METABOLIC PANEL - Abnormal; Notable for the following components:      Result Value   Glucose, Bld 101 (*)    BUN 25 (*)    Creatinine, Ser 1.73 (*)    GFR calc non Af Amer 35 (*)    GFR calc Af Amer 41 (*)    All other components within normal limits  CBC - Abnormal; Notable for the following components:   RBC 3.65 (*)    Hemoglobin 9.7 (*)    HCT 32.7 (*)    MCHC 29.7 (*)    All other components within normal limits  TROPONIN I - Abnormal; Notable for the following components:   Troponin I 0.03 (*)    All other components within normal limits  PROTIME-INR - Abnormal; Notable for the following components:   Prothrombin Time 24.1 (*)    All other components within normal limits  BRAIN NATRIURETIC PEPTIDE - Abnormal; Notable for the following components:   B Natriuretic Peptide 720.0 (*)    All other components within normal limits  TROPONIN I - Abnormal; Notable for the following components:   Troponin I 0.03 (*)    All other components within normal limits  POC OCCULT BLOOD, ED    EKG  EKG Interpretation  Date/Time:  Monday April 14 2017 11:44:00 EST Ventricular Rate:  59 PR Interval:  400 QRS Duration: 114 QT Interval:  504 QTC Calculation: 498 R Axis:   -2 Text Interpretation:  Sinus bradycardia with 1st degree A-V block Incomplete right bundle branch block Nonspecific ST abnormality Prolonged QT Abnormal ECG Confirmed by Nat Christen [id number]) on 04/14/2017 3:04:10 PM       Radiology Dg Chest 2 View  Result Date: 04/14/2017 CLINICAL DATA:  Shortness of breath and weakness for 1 month. EXAM: CHEST  2 VIEW COMPARISON:  12/20/2016 FINDINGS: Mild cardiac enlargement. No pleural effusion or edema. Calcified granuloma within the left midlung identified. No superimposed airspace consolidation. IMPRESSION: 1. No acute  cardiopulmonary abnormalities. 2. Prior granulomatous disease. Electronically Signed   By: Kerby Moors M.D.   On: 04/14/2017 12:06    Procedures Procedures (including critical care time)  Medications Ordered in ED Medications - No data to  display   Initial Impression / Assessment and Plan / ED Course  I have reviewed the triage vital signs and the nursing notes.  Pertinent labs & imaging results that were available during my care of the patient were reviewed by me and considered in my medical decision making (see chart for details).     Patient is in no acute distress.  He is anemic.  His rectal exam was heme-negative.  Additionally his creatinine and BNP are elevated, but not much different from baseline.  Chest x-ray negative for heart failure.  These tests were discussed with the patient and his significant other.  I am most concerned about his anemia.  He will need a diagnostic workup.  Will prescribe ferrous sulfate as an outpatient.  Final Clinical Impressions(s) / ED Diagnoses   Final diagnoses:  Anemia, unspecified type  AKI (acute kidney injury) ([place])  Elevated brain natriuretic peptide (BNP) level    ED Discharge Orders        Ordered    ferrous sulfate 325 (65 FE) MG tablet  Daily     04/14/17 2106       Nat Christen, MD 04/14/17 2141

## 2017-04-14 NOTE — ED Notes (Signed)
EDP at bedside  

## 2017-04-14 NOTE — ED Notes (Signed)
Date and time results received: 04/14/17 1348  Test: Troponin  Critical Value: 0.03  Name of Provider Notified: Dr. Lacinda Axon  Orders Received? Or Actions Taken?:No new orders given.

## 2017-04-14 NOTE — Discharge Instructions (Signed)
You are anemic which will make you feel weak.  Prescription for iron tablets.  However you must follow-up with your primary care doctor to further evaluate this.  Additionally, your creatinine (kidney function] is slightly elevated and your BNP is elevated.  Your chest x-ray does not show any excessive fluid.

## 2017-04-21 NOTE — Progress Notes (Signed)
Cardiology Office Note    Date:  04/22/2017   ID:  Brett [name], DOB 1932/08/07, MRN [id number]  PCP:  [name], MD  Cardiologist: Dr. Bronson Ing   Chief Complaint  Patient presents with  . Follow-up    Dyspnea on Exertion    History of Present Illness:    Brett [name] is a 84 y.o. male with past medical history of CAD (s/p POBA of D1 in 2009 with residual 20-30% stenosis along RCA and LAD), chronic diastolic CHF, PAF (s/p DCCV in 12/2016, remains on Coumadin and Amiodarone), HTN, and HLD who presents to the office today for evaluation of worsening dyspnea on exertion.   He was last examined by Dr. Bronson Ing in 01/2017 and reported doing well at that time, denying any recent chest pain or dyspnea on exertion. He was restarted on Lopressor 25mg  BID at that time.    Was recently evaluated at [facility] ED on 04/14/2017 for worsening dyspnea on exertion. Denied any associated chest pain or palpitations at that time. EKG showed he was in sinus bradycardia with HR of 59. BNP was consistent with prior values of 720. Initial and delta troponin values were flat at 0.03. Was found to be anemic at 9.7 and he was started on iron supplementation at that time with further outpatient evaluation of his anemia recommended.   In talking with the patient today, he reports having worsening dyspnea on exertion and fatigue over the past 2-3 months. He denies any associated chest discomfort, palpitations, orthopnea, or PND. Does have occasional lower extremity edema which improves when he takes Lasix.   He denies any evidence of active bleeding. Was recently started on Iron and B-12 supplementation.   Past Medical History:  Diagnosis Date  . Atrial fibrillation ([place])    a. s/p DCCV in 12/2016, remains on Coumadin and Amiodarone  . CAD (coronary artery disease)    a. s/p POBA of D1 in 2009 with residual 20-30% stenosis along RCA and LAD  . Chronic kidney disease (CKD), stage III (moderate) (HCC)    . Dysrhythmia    AFib  . Essential hypertension, benign   . History of cardiac catheterization    Details not certain - reportedly no interventions  . Hypothyroidism   . Macular degeneration   . Pulmonary hypertension ([place]) 12/03/2013   PA pressure 56. Ejection fraction 55-60%  . Type 2 diabetes mellitus ([place])     Past Surgical History:  Procedure Laterality Date  . CARDIOVERSION N/A 12/10/2013   Procedure: CARDIOVERSION;  Surgeon: Herminio Commons, MD;  Location: [facility];  Service: Endoscopy;  Laterality: N/A;  . CARDIOVERSION N/A 11/15/2016   Procedure: CARDIOVERSION;  Surgeon: Arnoldo Lenis, MD;  Location: [facility] ORS;  Service: Endoscopy;  Laterality: N/A;  . CARDIOVERSION N/A 12/18/2016   Procedure: CARDIOVERSION;  Surgeon: Arnoldo Lenis, MD;  Location: [facility] ENDO SUITE;  Service: Endoscopy;  Laterality: N/A;  pt knows to arrive at 8:00  . CATARACT EXTRACTION W/PHACO Left 11/13/2015   Procedure: CATARACT EXTRACTION PHACO AND INTRAOCULAR LENS PLACEMENT LEFT EYE CDE=7.76;  Surgeon: Williams Che, MD;  Location: [facility] ORS;  Service: Ophthalmology;  Laterality: Left;  . CHOLECYSTECTOMY    . COLONOSCOPY     Remote > 10 years ago  . ERCP N/A 10/17/2016   Dr. Gala Romney: sphincterotomy and stone extraction   . REMOVAL OF STONES  10/17/2016   Procedure: REMOVAL OF STONES with balloon;  Surgeon: Daneil Dolin, MD;  Location: [facility] ENDO  SUITE;  Service: Endoscopy;;  . REPLACEMENT TOTAL HIP W/  RESURFACING IMPLANTS Bilateral   . SPHINCTEROTOMY  10/17/2016   Procedure: BILLARY SPHINCTEROTOMY with ballon dilation;  Surgeon: Daneil Dolin, MD;  Location: [facility];  Service: Endoscopy;;  . TEE WITHOUT CARDIOVERSION N/A 12/10/2013   Procedure: TRANSESOPHAGEAL ECHOCARDIOGRAM (TEE);  Surgeon: Herminio Commons, MD;  Location: [facility] ORS;  Service: Endoscopy;  Laterality: N/A;    Current Medications: Outpatient Medications Prior to Visit  Medication Sig Dispense Refill  . albuterol (PROVENTIL  HFA;VENTOLIN HFA) 108 (90 Base) MCG/ACT inhaler Inhale 1-2 puffs into the lungs every 6 (six) hours as needed for wheezing or shortness of breath.    Marland Kitchen aspirin EC 81 MG EC tablet Take 1 tablet (81 mg total) by mouth daily.    . diphenhydrAMINE (BENADRYL) 12.5 MG/5ML elixir Take 50 mg by mouth at bedtime as needed for allergies or sleep.    Marland Kitchen doxazosin (CARDURA) 2 MG tablet Take 1 tablet (2 mg total) by mouth at bedtime. 90 tablet 3  . ferrous sulfate 325 (65 FE) MG tablet Take 1 tablet (325 mg total) by mouth daily. 30 tablet 1  . furosemide (LASIX) 40 MG tablet Take 1 tablet (40 mg total) by mouth daily. 30 tablet 2  . levothyroxine (SYNTHROID, LEVOTHROID) 50 MCG tablet Take 50 mcg by mouth daily before breakfast.     . MELATONIN PO Take 4 mg by mouth at bedtime. 2 mg tablets    . metFORMIN (GLUCOPHAGE) 500 MG tablet Take 1 tablet (500 mg total) by mouth 2 (two) times daily with a meal. 180 tablet 1  . metoprolol tartrate (LOPRESSOR) 50 MG tablet Take 50 mg by mouth 2 (two) times daily.    Marland Kitchen warfarin (COUMADIN) 3 MG tablet Take 1 tablet (3 mg total) by mouth daily at 6 PM. (Patient taking differently: Take 3 mg by mouth daily at 6 PM. 1/2 tablet on Monday, Thursday) 30 tablet 0  . amiodarone (PACERONE) 200 MG tablet Take 200 mg by mouth daily.    . metoprolol tartrate (LOPRESSOR) 25 MG tablet Take 1 tablet (25 mg total) by mouth 2 (two) times daily. (Patient taking differently: Take 50 mg by mouth 2 (two) times daily. ) 180 tablet 3  . B Complex-C-Folic Acid (NEPHRO-VITE PO) Take 1 tablet by mouth daily.     No facility-administered medications prior to visit.      Allergies:   Patient has no known allergies.   Social History   Socioeconomic History  . Marital status: Single    Spouse name: None  . Number of children: None  . Years of education: None  . Highest education level: None  Social Needs  . Financial resource strain: None  . Food insecurity - worry: None  . Food insecurity -  inability: None  . Transportation needs - medical: None  . Transportation needs - non-medical: None  Occupational History  . None  Tobacco Use  . Smoking status: Former Smoker    Packs/day: 1.00    Years: 37.00    Pack years: 37.00    Types: Cigarettes    Start date: 11/01/1952    Last attempt to quit: 08/18/1978    Years since quitting: 38.7  . Smokeless tobacco: Never Used  Substance and Sexual Activity  . Alcohol use: Yes    Alcohol/week: 0.0 oz    Comment: Occasional  . Drug use: No  . Sexual activity: Not Currently  Other Topics Concern  .  None  Social History Narrative  . None     Family History:  The patient's family history includes Hypertension in his father and mother.   Review of Systems:   Please see the history of present illness.     General:  No chills, fever, night sweats or weight changes.  Cardiovascular:  No chest pain, edema, orthopnea, palpitations, paroxysmal nocturnal dyspnea. Positive for dyspnea on exertion.  Dermatological: No rash, lesions/masses Respiratory: No cough, dyspnea Urologic: No hematuria, dysuria Abdominal:   No nausea, vomiting, diarrhea, bright red blood per rectum, melena, or hematemesis Neurologic:  No visual changes, wkns, changes in mental status. All other systems reviewed and are otherwise negative except as noted above.   Physical Exam:    VS:  BP (!) 150/70   Pulse 70   Ht 6' (1.829 m)   Wt 207 lb (93.9 kg)   SpO2 95%   BMI 28.07 kg/m    General: Well developed, well nourished Caucasian male appearing in no acute distress. Head: Normocephalic, atraumatic, sclera non-icteric, no xanthomas, nares are without discharge.  Neck: No carotid bruits. JVD not elevated.  Lungs: Respirations regular and unlabored, without wheezes or rales.  Heart: Regular rate and rhythm. No S3 or S4.  No murmur, no rubs, or gallops appreciated. Abdomen: Soft, non-tender, non-distended with normoactive bowel sounds. No hepatomegaly. No  rebound/guarding. No obvious abdominal masses. Msk:  Strength and tone appear normal for age. No joint deformities or effusions. Extremities: No clubbing or cyanosis. Trace lower extremity edema.  Distal pedal pulses are 2+ bilaterally. Neuro: Alert and oriented X 3. Moves all extremities spontaneously. No focal deficits noted. Psych:  Responds to questions appropriately with a normal affect. Skin: No rashes or lesions noted  Wt Readings from Last 3 Encounters:  04/22/17 207 lb (93.9 kg)  04/14/17 205 lb (93 kg)  01/13/17 203 lb (92.1 kg)    Studies/Labs Reviewed:   EKG:  EKG is not ordered today.  EKG from 04/14/2017 is reviewed which shows sinus bradycardia, HR 59, with no acute ST or T-wave changes when compared to prior tracings.   Recent Labs: 10/31/2016: Magnesium 2.1 11/13/2016: ALT 22 04/14/2017: B Natriuretic Peptide 720.0; BUN 25; Creatinine, Ser 1.73; Hemoglobin 9.7; Platelets 223; Potassium 4.2; Sodium 143; TSH 5.535   Lipid Panel    Component Value Date/Time   CHOL 158 10/18/2016 0558   TRIG 108 10/18/2016 0558   HDL 45 10/18/2016 0558   CHOLHDL 3.5 10/18/2016 0558   VLDL 22 10/18/2016 0558   LDLCALC 91 10/18/2016 0558    Additional studies/ records that were reviewed today include:   Echocardiogram: 11/2016 Study Conclusions  - Left ventricle: The cavity size was normal. Wall thickness was   increased in a pattern of moderate LVH. Systolic function was   normal. The estimated ejection fraction was in the range of 50%   to 55%. - Aortic valve: There was mild to moderate regurgitation. Valve   area (VTI): 3.42 cm^2. Valve area (Vmax): 3.76 cm^2. - Mitral valve: Mildly to moderately calcified annulus. Normal   thickness leaflets . - Left atrium: The atrium was moderately to severely dilated. - Right ventricle: The cavity size was mildly dilated. Systolic   function was mildly reduced. - Right atrium: The atrium was moderately dilated. - Atrial septum: No  defect or patent foramen ovale was identified. - Inferior vena cava: The vessel was dilated. The respirophasic   diameter changes were blunted (< 50%), consistent with elevated   central  venous pressure. - Technically adequate study.  Assessment:    1. Dyspnea on exertion   2. Chronic diastolic heart failure ([place])   3. Coronary artery disease involving native coronary artery of native heart with angina pectoris (HCC)   4. Paroxysmal atrial fibrillation ([place])   5. Long term current use of amiodarone   6. Essential hypertension   7. Anemia, unspecified type   8. Chronic kidney disease (CKD), stage III (moderate) (HCC)      Plan:   In order of problems listed above:  1. Chronic Diastolic CHF - recent echo in 11/2016 showed an EF of 50-55%. Recent BNP was elevated to 720 which was consistent with prior values and CXR showing no significant cardiopulmonary abnormalities. He does not appear volume overloaded by physical examination today.  - continue with Lasix 40mg  daily. We reviewed sodium and fluid restriction. May take an additional Lasix tablet for weight gain > 3 lbs overnight or > 5 lbs in one week.    2. CAD - s/p POBA of D1 in 2009 with residual 20-30% stenosis along RCA and LAD. - he denies any recent chest pain but has dyspnea on exertion. If PFT's are within normal limits and dyspnea continues despite improvement in anemia, could consider a repeat NST to rule-out an ischemic etiology.  - continue ASA (has remained on this along with Coumadin) and BB therapy. Intolerant to statins in the past.   3. PAF/ Use of Long-term Anticoagulation and Use of Amiodarone - s/p DCCV in 12/2016, remains on Coumadin and Amiodarone. He denies any recurrent palpitations but has been experiencing dyspnea on exertion. Will check PFT's in the setting of Amiodarone use (I cannot see where baseline PFT's were obtained prior to initiation of this).  - continue Coumadin for anticoagulation.   4.  HTN - BP is elevated at 150/70 during today's visit. Reports it is usually in the 120's - 130's/80's when checked at home. I have asked him to continue to follow his BP at home. If this remains elevated, would further titrate Cardura to 4mg  daily or consider the addition of Losartan 25mg  daily (pending repeat labwork).   5. Anemia - Hgb at 9.7 on most recent check. Has been started on B-12 and Iron supplementation by his PCP.   6. Stage 3 CKD - baseline creatinine 1.2 - 1.3. - reports labs were just rechecked by his PCP. Will request these.    Medication Adjustments/Labs and Tests Ordered: Current medicines are reviewed at length with the patient today.  Concerns regarding medicines are outlined above.  Medication changes, Labs and Tests ordered today are listed in the Patient Instructions below. Patient Instructions  Medication Instructions:  Your physician recommends that you continue on your current medications as directed. Please refer to the Current Medication list given to you today.  Labwork: NONE   Testing/Procedures: Your physician has recommended that you have a pulmonary function test. Pulmonary Function Tests are a group of tests that measure how well air moves in and out of your lungs.   Follow-Up: Your physician recommends that you schedule a follow-up appointment with Dr. Bronson Ing  Any Other Special Instructions Will Be Listed Below (If Applicable).  If you need a refill on your cardiac medications before your next appointment, please call your pharmacy. Thank you for choosing [facility]!    Signed, Erma Heritage, PA-C  04/22/2017 5:10 PM    [facility] [address] [place], [place] [address] Phone: [phone])  [phone]   

## 2017-04-22 ENCOUNTER — Ambulatory Visit: Payer: Medicare HMO | Admitting: Student

## 2017-04-22 ENCOUNTER — Encounter: Payer: Self-pay | Admitting: Student

## 2017-04-22 VITALS — BP 150/70 | HR 70 | Ht 72.0 in | Wt 207.0 lb

## 2017-04-22 DIAGNOSIS — I480 Paroxysmal atrial fibrillation: Secondary | ICD-10-CM | POA: Diagnosis not present

## 2017-04-22 DIAGNOSIS — I10 Essential (primary) hypertension: Secondary | ICD-10-CM | POA: Diagnosis not present

## 2017-04-22 DIAGNOSIS — D649 Anemia, unspecified: Secondary | ICD-10-CM | POA: Diagnosis not present

## 2017-04-22 DIAGNOSIS — I25119 Atherosclerotic heart disease of native coronary artery with unspecified angina pectoris: Secondary | ICD-10-CM | POA: Diagnosis not present

## 2017-04-22 DIAGNOSIS — N183 Chronic kidney disease, stage 3 unspecified: Secondary | ICD-10-CM

## 2017-04-22 DIAGNOSIS — I5032 Chronic diastolic (congestive) heart failure: Secondary | ICD-10-CM | POA: Diagnosis not present

## 2017-04-22 DIAGNOSIS — Z79899 Other long term (current) drug therapy: Secondary | ICD-10-CM

## 2017-04-22 DIAGNOSIS — R0609 Other forms of dyspnea: Secondary | ICD-10-CM | POA: Diagnosis not present

## 2017-04-22 MED ORDER — AMIODARONE HCL 200 MG PO TABS: 200.0000 mg | ORAL_TABLET | Freq: Every day | ORAL | 3 refills | Status: DC

## 2017-04-22 NOTE — Patient Instructions (Signed)
Medication Instructions:  Your physician recommends that you continue on your current medications as directed. Please refer to the Current Medication list given to you today.   Labwork: NONE   Testing/Procedures: Your physician has recommended that you have a pulmonary function test. Pulmonary Function Tests are a group of tests that measure how well air moves in and out of your lungs.    Follow-Up: Your physician recommends that you schedule a follow-up appointment with Dr. Bronson Ing   Any Other Special Instructions Will Be Listed Below (If Applicable).     If you need a refill on your cardiac medications before your next appointment, please call your pharmacy. Thank you for choosing [facility]!

## 2017-04-24 DIAGNOSIS — L570 Actinic keratosis: Secondary | ICD-10-CM | POA: Diagnosis not present

## 2017-04-24 DIAGNOSIS — C44612 Basal cell carcinoma of skin of right upper limb, including shoulder: Secondary | ICD-10-CM | POA: Diagnosis not present

## 2017-04-24 DIAGNOSIS — X32XXXD Exposure to sunlight, subsequent encounter: Secondary | ICD-10-CM | POA: Diagnosis not present

## 2017-04-29 DIAGNOSIS — I482 Chronic atrial fibrillation: Secondary | ICD-10-CM | POA: Diagnosis not present

## 2017-04-29 DIAGNOSIS — I5032 Chronic diastolic (congestive) heart failure: Secondary | ICD-10-CM | POA: Diagnosis not present

## 2017-04-29 DIAGNOSIS — E119 Type 2 diabetes mellitus without complications: Secondary | ICD-10-CM | POA: Diagnosis not present

## 2017-05-05 DIAGNOSIS — H353231 Exudative age-related macular degeneration, bilateral, with active choroidal neovascularization: Secondary | ICD-10-CM | POA: Diagnosis not present

## 2017-05-05 DIAGNOSIS — H2511 Age-related nuclear cataract, right eye: Secondary | ICD-10-CM | POA: Diagnosis not present

## 2017-05-06 ENCOUNTER — Ambulatory Visit (HOSPITAL_COMMUNITY)
Admission: RE | Admit: 2017-05-06 | Discharge: 2017-05-06 | Disposition: A | Discharge status: Home / Self Care | Payer: Medicare HMO | Source: Ambulatory Visit | Attending: Student | Admitting: Student

## 2017-05-06 DIAGNOSIS — J984 Other disorders of lung: Secondary | ICD-10-CM | POA: Diagnosis not present

## 2017-05-06 DIAGNOSIS — J449 Chronic obstructive pulmonary disease, unspecified: Secondary | ICD-10-CM | POA: Diagnosis not present

## 2017-05-06 DIAGNOSIS — Z79899 Other long term (current) drug therapy: Secondary | ICD-10-CM | POA: Diagnosis present

## 2017-05-06 DIAGNOSIS — R942 Abnormal results of pulmonary function studies: Secondary | ICD-10-CM | POA: Insufficient documentation

## 2017-05-06 LAB — PULMONARY FUNCTION TEST
DL/VA % PRED: 68 %
DL/VA: 3.22 ml/min/mmHg/L
DLCO unc % pred: 34 %
DLCO unc: 11.95 ml/min/mmHg
FEF 25-75 POST: 0.72 L/s
FEF 25-75 Pre: 0.6 L/sec
FEF2575-%CHANGE-POST: 21 %
FEF2575-%Pred-Post: 35 %
FEF2575-%Pred-Pre: 29 %
FEV1-%CHANGE-POST: 8 %
FEV1-%Pred-Post: 47 %
FEV1-%Pred-Pre: 44 %
FEV1-PRE: 1.34 L
FEV1-Post: 1.44 L
FEV1FVC-%CHANGE-POST: 0 %
FEV1FVC-%Pred-Pre: 85 %
FEV6-%Change-Post: 5 %
FEV6-%PRED-PRE: 53 %
FEV6-%Pred-Post: 55 %
FEV6-Post: 2.23 L
FEV6-Pre: 2.12 L
FEV6FVC-%Change-Post: -3 %
FEV6FVC-%Pred-Post: 100 %
FEV6FVC-%Pred-Pre: 103 %
FVC-%Change-Post: 8 %
FVC-%PRED-POST: 56 %
FVC-%PRED-PRE: 51 %
FVC-POST: 2.39 L
FVC-PRE: 2.19 L
POST FEV6/FVC RATIO: 93 %
PRE FEV1/FVC RATIO: 61 %
PRE FEV6/FVC RATIO: 97 %
Post FEV1/FVC ratio: 60 %
RV % pred: 151 %
RV: 4.28 L
TLC % pred: 92 %
TLC: 6.89 L

## 2017-05-06 MED ORDER — ALBUTEROL SULFATE (2.5 MG/3ML) 0.083% IN NEBU
2.5000 mg | INHALATION_SOLUTION | Freq: Once | RESPIRATORY_TRACT | Status: AC
  Administered 2017-05-06: 2.5 mg via RESPIRATORY_TRACT

## 2017-05-07 ENCOUNTER — Telehealth: Payer: Self-pay

## 2017-05-07 DIAGNOSIS — R942 Abnormal results of pulmonary function studies: Secondary | ICD-10-CM

## 2017-05-07 NOTE — Telephone Encounter (Signed)
Spoke with pt. Informed him of his test results. Put a referral in for pulmonology as he does not have an existing pulmonologist. He voiced understanding of plan.

## 2017-05-07 NOTE — Telephone Encounter (Signed)
-----   Message from Erma Heritage, [place] sent at 05/06/2017  2:41 PM EST ----- Please let the patient know his pulmonary function tests were abnormal and raise concern for emphysema or a more chronic lung process. If still having dyspnea on exertion, would recommend referral to Pulmonology as he is also on Amiodarone. Dr. Luan Pulling read the study, so if he could follow-up with him, that would be great unless he has seen a Pulmonologist in the past. Please forward results to his PCP.   Thanks, [place]

## 2017-05-08 DIAGNOSIS — R5382 Chronic fatigue, unspecified: Secondary | ICD-10-CM | POA: Diagnosis not present

## 2017-05-08 DIAGNOSIS — G4733 Obstructive sleep apnea (adult) (pediatric): Secondary | ICD-10-CM | POA: Diagnosis not present

## 2017-05-08 DIAGNOSIS — J439 Emphysema, unspecified: Secondary | ICD-10-CM | POA: Diagnosis not present

## 2017-05-12 DIAGNOSIS — R69 Illness, unspecified: Secondary | ICD-10-CM | POA: Diagnosis not present

## 2017-05-14 ENCOUNTER — Telehealth: Payer: Self-pay | Admitting: Cardiovascular Disease

## 2017-05-14 NOTE — Telephone Encounter (Signed)
Pt called wanting to speak w/ a nurse cause he's having some breathing problems

## 2017-05-14 NOTE — Telephone Encounter (Signed)
Returned pt call, he complains that he is having some trouble breathing at night when he is trying to sleep. He stated that he had already called his PCP and has an appointment with them tomorrow. So he will she what they say. He does have an appointment with Dr. Luan Pulling next week to follow up on his PFT results. He has no complaints of CP. He states he will call us back if his PCP cannot figure his problem out.

## 2017-05-15 DIAGNOSIS — R0601 Orthopnea: Secondary | ICD-10-CM | POA: Diagnosis not present

## 2017-05-15 DIAGNOSIS — R0600 Dyspnea, unspecified: Secondary | ICD-10-CM | POA: Diagnosis not present

## 2017-05-15 DIAGNOSIS — J439 Emphysema, unspecified: Secondary | ICD-10-CM | POA: Diagnosis not present

## 2017-05-26 ENCOUNTER — Encounter: Payer: Self-pay | Admitting: Cardiovascular Disease

## 2017-05-26 ENCOUNTER — Ambulatory Visit: Payer: Medicare HMO | Admitting: Cardiovascular Disease

## 2017-05-26 ENCOUNTER — Other Ambulatory Visit (HOSPITAL_COMMUNITY)
Admission: RE | Admit: 2017-05-26 | Discharge: 2017-05-26 | Disposition: A | Discharge status: Home / Self Care | Payer: Medicare HMO | Source: Ambulatory Visit | Attending: Cardiovascular Disease | Admitting: Cardiovascular Disease

## 2017-05-26 VITALS — BP 118/58 | HR 62 | Ht 72.0 in | Wt 201.0 lb

## 2017-05-26 DIAGNOSIS — R942 Abnormal results of pulmonary function studies: Secondary | ICD-10-CM | POA: Diagnosis not present

## 2017-05-26 DIAGNOSIS — I10 Essential (primary) hypertension: Secondary | ICD-10-CM | POA: Diagnosis not present

## 2017-05-26 DIAGNOSIS — I5032 Chronic diastolic (congestive) heart failure: Secondary | ICD-10-CM | POA: Diagnosis present

## 2017-05-26 DIAGNOSIS — I4811 Longstanding persistent atrial fibrillation: Secondary | ICD-10-CM

## 2017-05-26 DIAGNOSIS — D649 Anemia, unspecified: Secondary | ICD-10-CM

## 2017-05-26 DIAGNOSIS — N183 Chronic kidney disease, stage 3 unspecified: Secondary | ICD-10-CM

## 2017-05-26 DIAGNOSIS — I4891 Unspecified atrial fibrillation: Secondary | ICD-10-CM | POA: Insufficient documentation

## 2017-05-26 DIAGNOSIS — R0609 Other forms of dyspnea: Secondary | ICD-10-CM

## 2017-05-26 DIAGNOSIS — R531 Weakness: Secondary | ICD-10-CM | POA: Diagnosis not present

## 2017-05-26 DIAGNOSIS — I25119 Atherosclerotic heart disease of native coronary artery with unspecified angina pectoris: Secondary | ICD-10-CM

## 2017-05-26 DIAGNOSIS — E782 Mixed hyperlipidemia: Secondary | ICD-10-CM | POA: Diagnosis not present

## 2017-05-26 DIAGNOSIS — I481 Persistent atrial fibrillation: Secondary | ICD-10-CM

## 2017-05-26 LAB — CBC
HCT: 39.5 % (ref 39.0–52.0)
HEMOGLOBIN: 12.6 g/dL — AB (ref 13.0–17.0)
MCH: 27.9 pg (ref 26.0–34.0)
MCHC: 31.9 g/dL (ref 30.0–36.0)
MCV: 87.6 fL (ref 78.0–100.0)
Platelets: 187 10*3/uL (ref 150–400)
RBC: 4.51 MIL/uL (ref 4.22–5.81)
RDW: 16.6 % — ABNORMAL HIGH (ref 11.5–15.5)
WBC: 5 10*3/uL (ref 4.0–10.5)

## 2017-05-26 MED ORDER — METOPROLOL TARTRATE 25 MG PO TABS: 25.0000 mg | ORAL_TABLET | Freq: Two times a day (BID) | ORAL | 1 refills | Status: DC

## 2017-05-26 NOTE — Progress Notes (Signed)
SUBJECTIVE: The patient presents for follow-up after undergoing testing for the evaluation of dyspnea.  Pulmonary function testing demonstrated a severe ventilatory defect with severely reduced DLCO with findings consistent with COPD.  He also has a history of persistent atrial fibrillation, coronary artery disease, and chronic diastolic heart failure.  He denies chest pain but complains of significant generalized weakness and fatigue.  He also complains of diminished appetite.  He said his monitor at home registers heart rates in the 70 bpm range.  He is going to see a pulmonologist next week.      Review of Systems: As per "subjective", otherwise negative.  No Known Allergies  Current Outpatient Medications  Medication Sig Dispense Refill  . albuterol (PROVENTIL HFA;VENTOLIN HFA) 108 (90 Base) MCG/ACT inhaler Inhale 1-2 puffs into the lungs every 6 (six) hours as needed for wheezing or shortness of breath.    Marland Kitchen amiodarone (PACERONE) 200 MG tablet Take 1 tablet (200 mg total) by mouth daily. 90 tablet 3  . aspirin EC 81 MG EC tablet Take 1 tablet (81 mg total) by mouth daily.    . diphenhydrAMINE (BENADRYL) 12.5 MG/5ML elixir Take 50 mg by mouth at bedtime as needed for allergies or sleep.    Marland Kitchen doxazosin (CARDURA) 2 MG tablet Take 1 tablet (2 mg total) by mouth at bedtime. 90 tablet 3  . ferrous sulfate 325 (65 FE) MG tablet Take 1 tablet (325 mg total) by mouth daily. 30 tablet 1  . furosemide (LASIX) 40 MG tablet Take 1 tablet (40 mg total) by mouth daily. 30 tablet 2  . levothyroxine (SYNTHROID, LEVOTHROID) 50 MCG tablet Take 50 mcg by mouth daily before breakfast.     . MELATONIN PO Take 4 mg by mouth at bedtime. 2 mg tablets    . metFORMIN (GLUCOPHAGE) 500 MG tablet Take 1 tablet (500 mg total) by mouth 2 (two) times daily with a meal. 180 tablet 1  . metoprolol tartrate (LOPRESSOR) 50 MG tablet Take 50 mg by mouth 2 (two) times daily.    Marland Kitchen warfarin (COUMADIN) 3 MG tablet  Take 1 tablet (3 mg total) by mouth daily at 6 PM. (Patient taking differently: Take 3 mg by mouth daily at 6 PM. 1/2 tablet on Monday, Thursday) 30 tablet 0   No current facility-administered medications for this visit.     Past Medical History:  Diagnosis Date  . Atrial fibrillation ([place])    a. s/p DCCV in 12/2016, remains on Coumadin and Amiodarone  . CAD (coronary artery disease)    a. s/p POBA of D1 in 2009 with residual 20-30% stenosis along RCA and LAD  . Chronic kidney disease (CKD), stage III (moderate) (HCC)   . Dysrhythmia    AFib  . Essential hypertension, benign   . History of cardiac catheterization    Details not certain - reportedly no interventions  . Hypothyroidism   . Macular degeneration   . Pulmonary hypertension ([place]) 12/03/2013   PA pressure 56. Ejection fraction 55-60%  . Type 2 diabetes mellitus ([place])     Past Surgical History:  Procedure Laterality Date  . CARDIOVERSION N/A 12/10/2013   Procedure: CARDIOVERSION;  Surgeon: Herminio Commons, MD;  Location: [facility];  Service: Endoscopy;  Laterality: N/A;  . CARDIOVERSION N/A 11/15/2016   Procedure: CARDIOVERSION;  Surgeon: Arnoldo Lenis, MD;  Location: [facility];  Service: Endoscopy;  Laterality: N/A;  . CARDIOVERSION N/A 12/18/2016   Procedure: CARDIOVERSION;  Surgeon: [name]  F, MD;  Location: [facility];  Service: Endoscopy;  Laterality: N/A;  pt knows to arrive at 8:00  . CATARACT EXTRACTION W/PHACO Left 11/13/2015   Procedure: CATARACT EXTRACTION PHACO AND INTRAOCULAR LENS PLACEMENT LEFT EYE CDE=7.76;  Surgeon: Williams Che, MD;  Location: [facility];  Service: Ophthalmology;  Laterality: Left;  . CHOLECYSTECTOMY    . COLONOSCOPY     Remote > 10 years ago  . ERCP N/A 10/17/2016   Dr. Gala Romney: sphincterotomy and stone extraction   . REMOVAL OF STONES  10/17/2016   Procedure: REMOVAL OF STONES with balloon;  Surgeon: Daneil Dolin, MD;  [facility];  Service: Endoscopy;;  .  REPLACEMENT TOTAL HIP W/  RESURFACING IMPLANTS Bilateral   . SPHINCTEROTOMY  10/17/2016   Procedure: BILLARY SPHINCTEROTOMY with ballon dilation;  Surgeon: Daneil Dolin, MD;  [facility];  Service: Endoscopy;;  . TEE WITHOUT CARDIOVERSION N/A 12/10/2013   Procedure: TRANSESOPHAGEAL ECHOCARDIOGRAM (TEE);  Surgeon: Herminio Commons, MD;  Location: [facility];  Service: Endoscopy;  Laterality: N/A;    Social History   Socioeconomic History  . Marital status: Single    Spouse name: Not on file  . Number of children: Not on file  . Years of education: Not on file  . Highest education level: Not on file  Social Needs  . Financial resource strain: Not on file  . Food insecurity - worry: Not on file  . Food insecurity - inability: Not on file  . Transportation needs - medical: Not on file  . Transportation needs - non-medical: Not on file  Occupational History  . Not on file  Tobacco Use  . Smoking status: Former Smoker    Packs/day: 1.00    Years: 37.00    Pack years: 37.00    Types: Cigarettes    Start date: 11/01/1952    Last attempt to quit: 08/18/1978    Years since quitting: 38.7  . Smokeless tobacco: Never Used  Substance and Sexual Activity  . Alcohol use: Yes    Alcohol/week: 0.0 oz    Comment: Occasional  . Drug use: No  . Sexual activity: Not Currently  Other Topics Concern  . Not on file  Social History Narrative  . Not on file     Vitals:   05/26/17 1126  BP: (!) 118/58  Pulse: 62  SpO2: 93%  Weight: 201 lb (91.2 kg)  Height: 6' (1.829 m)    Wt Readings from Last 3 Encounters:  05/26/17 201 lb (91.2 kg)  04/22/17 207 lb (93.9 kg)  04/14/17 205 lb (93 kg)     PHYSICAL EXAM General: NAD HEENT: Normal. Neck: No JVD, no thyromegaly. Lungs: Clear to auscultation bilaterally with normal respiratory effort. CV: Regular rate and irregular rhythm, normal S1/S2, no S3, no murmur. No pretibial or periankle edema.  Abdomen: Soft, nontender, no  distention.  Neurologic: Alert and oriented.  Psych: Normal affect. Skin: Normal. Musculoskeletal: No gross deformities.    ECG: Most recent ECG reviewed.   Labs: Lab Results  Component Value Date/Time   K 4.2 04/14/2017 12:39 PM   BUN 25 (H) 04/14/2017 12:39 PM   CREATININE 1.73 (H) 04/14/2017 12:39 PM   ALT 22 11/13/2016 04:35 AM   TSH 5.535 (H) 04/14/2017 12:39 PM   TSH 3.130 12/01/2013 08:47 PM   HGB 9.7 (L) 04/14/2017 12:39 PM     Lipids: Lab Results  Component Value Date/Time   LDLCALC 91 10/18/2016 05:58 AM  CHOL 158 10/18/2016 05:58 AM   TRIG 108 10/18/2016 05:58 AM   HDL 45 10/18/2016 05:58 AM       ASSESSMENT AND PLAN: 1. Longstanding persistent atrial fibrillation: Symptomatically stable. Currently on amiodarone and warfarin.  I will reduce metoprolol to 25 mg twice daily due to symptoms of generalized weakness and fatigue.  2. Chronic diastolic heart failure: Euvolemic. Continue Lasix 40 mg daily.  3. CAD: Stable ischemic heart disease. Symptoms medically stable. Continue aspirin and amiodarone. No longer on pravastatin. I will reduce metoprolol to 25 mg twice daily due to symptoms of generalized weakness and fatigue.  4. Hypertension: Controlled. Monitor given dose reduction of metoprolol.  5. First degree AV block: PR 400 ms on 04/14/17.  I will reduce metoprolol to 25 mg twice daily due to symptoms of generalized weakness and fatigue.  6.  Exertional dyspnea: Pulmonary function test reviewed above with findings consistent with COPD.  He has been referred to pulmonology.   I will reduce metoprolol to 25 mg twice daily due to symptoms of generalized weakness and fatigue.  7.  Generalized weakness and fatigue: Hemoglobin was 9.7 on 04/14/17 and was 11.2 on 12/21/16.  I will check a CBC to see if he has had a progressive decline.      Disposition: Follow up 1 month  Time spent: 40 minutes, of which greater than 50% was spent reviewing symptoms,  relevant blood tests and studies, and discussing management plan with the patient.    Kate Sable, M.D., F.A.C.C.

## 2017-05-26 NOTE — Patient Instructions (Addendum)
Your physician recommends that you schedule a follow-up appointment in: 1 month with Dr.Koneswaran    DECREASE Metoprolol to 25 mg twice a day    Please get lab work : CBC     No tests ordered today.      Thank you for choosing [facility] !

## 2017-05-28 DIAGNOSIS — G4733 Obstructive sleep apnea (adult) (pediatric): Secondary | ICD-10-CM | POA: Diagnosis not present

## 2017-06-01 ENCOUNTER — Emergency Department (HOSPITAL_COMMUNITY): Payer: Medicare HMO

## 2017-06-01 ENCOUNTER — Other Ambulatory Visit: Payer: Self-pay

## 2017-06-01 ENCOUNTER — Emergency Department (HOSPITAL_COMMUNITY)
Admission: EM | Admit: 2017-06-01 | Discharge: 2017-06-01 | Disposition: A | Discharge status: Home / Self Care | Payer: Medicare HMO | Attending: Emergency Medicine | Admitting: Emergency Medicine

## 2017-06-01 ENCOUNTER — Encounter (HOSPITAL_COMMUNITY): Payer: Self-pay | Admitting: Emergency Medicine

## 2017-06-01 DIAGNOSIS — Z87891 Personal history of nicotine dependence: Secondary | ICD-10-CM | POA: Diagnosis not present

## 2017-06-01 DIAGNOSIS — Z9049 Acquired absence of other specified parts of digestive tract: Secondary | ICD-10-CM | POA: Diagnosis not present

## 2017-06-01 DIAGNOSIS — Z79899 Other long term (current) drug therapy: Secondary | ICD-10-CM | POA: Diagnosis not present

## 2017-06-01 DIAGNOSIS — Z7984 Long term (current) use of oral hypoglycemic drugs: Secondary | ICD-10-CM | POA: Diagnosis not present

## 2017-06-01 DIAGNOSIS — I2510 Atherosclerotic heart disease of native coronary artery without angina pectoris: Secondary | ICD-10-CM | POA: Insufficient documentation

## 2017-06-01 DIAGNOSIS — I130 Hypertensive heart and chronic kidney disease with heart failure and stage 1 through stage 4 chronic kidney disease, or unspecified chronic kidney disease: Secondary | ICD-10-CM | POA: Insufficient documentation

## 2017-06-01 DIAGNOSIS — Z7901 Long term (current) use of anticoagulants: Secondary | ICD-10-CM | POA: Diagnosis not present

## 2017-06-01 DIAGNOSIS — I5033 Acute on chronic diastolic (congestive) heart failure: Secondary | ICD-10-CM | POA: Diagnosis not present

## 2017-06-01 DIAGNOSIS — Z7982 Long term (current) use of aspirin: Secondary | ICD-10-CM | POA: Diagnosis not present

## 2017-06-01 DIAGNOSIS — Z96643 Presence of artificial hip joint, bilateral: Secondary | ICD-10-CM | POA: Insufficient documentation

## 2017-06-01 DIAGNOSIS — E039 Hypothyroidism, unspecified: Secondary | ICD-10-CM | POA: Diagnosis not present

## 2017-06-01 DIAGNOSIS — I700 Atherosclerosis of aorta: Secondary | ICD-10-CM | POA: Diagnosis not present

## 2017-06-01 DIAGNOSIS — E1122 Type 2 diabetes mellitus with diabetic chronic kidney disease: Secondary | ICD-10-CM | POA: Diagnosis not present

## 2017-06-01 DIAGNOSIS — R531 Weakness: Secondary | ICD-10-CM | POA: Diagnosis not present

## 2017-06-01 DIAGNOSIS — I444 Left anterior fascicular block: Secondary | ICD-10-CM | POA: Diagnosis not present

## 2017-06-01 DIAGNOSIS — N183 Chronic kidney disease, stage 3 (moderate): Secondary | ICD-10-CM | POA: Diagnosis not present

## 2017-06-01 DIAGNOSIS — I4510 Unspecified right bundle-branch block: Secondary | ICD-10-CM | POA: Diagnosis not present

## 2017-06-01 DIAGNOSIS — R0602 Shortness of breath: Secondary | ICD-10-CM | POA: Diagnosis not present

## 2017-06-01 LAB — COMPREHENSIVE METABOLIC PANEL
ALT: 34 U/L (ref 17–63)
AST: 64 U/L — ABNORMAL HIGH (ref 15–41)
Albumin: 3.5 g/dL (ref 3.5–5.0)
Alkaline Phosphatase: 80 U/L (ref 38–126)
Anion gap: 7 (ref 5–15)
BUN: 18 mg/dL (ref 6–20)
CHLORIDE: 95 mmol/L — AB (ref 101–111)
CO2: 30 mmol/L (ref 22–32)
Calcium: 9.2 mg/dL (ref 8.9–10.3)
Creatinine, Ser: 1.8 mg/dL — ABNORMAL HIGH (ref 0.61–1.24)
GFR calc Af Amer: 39 mL/min — ABNORMAL LOW (ref >60)
GFR calc non Af Amer: 33 mL/min — ABNORMAL LOW (ref >60)
Glucose, Bld: 91 mg/dL (ref 65–99)
POTASSIUM: 4.6 mmol/L (ref 3.5–5.1)
SODIUM: 132 mmol/L — AB (ref 135–145)
Total Bilirubin: 0.9 mg/dL (ref 0.3–1.2)
Total Protein: 6.5 g/dL (ref 6.5–8.1)

## 2017-06-01 LAB — D-DIMER, QUANTITATIVE (NOT AT ARMC): D DIMER QUANT: 4.65 ug{FEU}/mL — AB (ref 0.00–0.50)

## 2017-06-01 LAB — CBC WITH DIFFERENTIAL/PLATELET
BASOS ABS: 0 10*3/uL (ref 0.0–0.1)
Basophils Relative: 1 %
EOS ABS: 0.6 10*3/uL (ref 0.0–0.7)
EOS PCT: 11 %
HCT: 37.3 % — ABNORMAL LOW (ref 39.0–52.0)
Hemoglobin: 11.9 g/dL — ABNORMAL LOW (ref 13.0–17.0)
Lymphocytes Relative: 28 %
Lymphs Abs: 1.4 10*3/uL (ref 0.7–4.0)
MCH: 27.7 pg (ref 26.0–34.0)
MCHC: 31.9 g/dL (ref 30.0–36.0)
MCV: 86.9 fL (ref 78.0–100.0)
MONO ABS: 0.7 10*3/uL (ref 0.1–1.0)
Monocytes Relative: 13 %
Neutro Abs: 2.4 10*3/uL (ref 1.7–7.7)
Neutrophils Relative %: 47 %
PLATELETS: 205 10*3/uL (ref 150–400)
RBC: 4.29 MIL/uL (ref 4.22–5.81)
RDW: 16.4 % — AB (ref 11.5–15.5)
WBC: 5.1 10*3/uL (ref 4.0–10.5)

## 2017-06-01 LAB — BRAIN NATRIURETIC PEPTIDE: B NATRIURETIC PEPTIDE 5: 282 pg/mL — AB (ref 0.0–100.0)

## 2017-06-01 LAB — PROTIME-INR
INR: 3.63
PROTHROMBIN TIME: 35.9 s — AB (ref 11.4–15.2)

## 2017-06-01 LAB — TROPONIN I

## 2017-06-01 MED ORDER — SODIUM CHLORIDE 0.9 % IV BOLUS (SEPSIS)
500.0000 mL | Freq: Once | INTRAVENOUS | Status: AC
  Administered 2017-06-01: 500 mL via INTRAVENOUS

## 2017-06-01 MED ORDER — IOPAMIDOL (ISOVUE-370) INJECTION 76%
80.0000 mL | Freq: Once | INTRAVENOUS | Status: AC | PRN
  Administered 2017-06-01: 80 mL via INTRAVENOUS

## 2017-06-01 NOTE — ED Triage Notes (Addendum)
Pt c/o generalized weakness x 2 weeks. Family reports patient has not eaten much over the past few weeks. Denies recent illness.

## 2017-06-01 NOTE — ED Notes (Signed)
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

## 2017-06-01 NOTE — Discharge Instructions (Signed)
Your weakness, is likely caused by several things.  To help that we are recommending that you stop taking the metoprolol(Lopressor), medication at this time.  This may cause your heart rate to go up so you will have to follow-up with your cardiologist.  I am going to let him know by message in the computer, what our plans are.  Also, make sure you are eating 3 meals a day even if you are not hungry.  You should be drinking about 1.5 liter of fluid each day.  Be careful when you stand up, that you do not fall.  Consider seeing your primary care doctor for arrangements to see a physical therapist, and get other help that might be needed.  Return here, if needed, for problems.

## 2017-06-01 NOTE — ED Notes (Addendum)
Rad in- O2 decreased to 0.5 [place]

## 2017-06-01 NOTE — Progress Notes (Signed)
Metformin form signed by patient a copy given to patient and scanned into EPIC. Copy of form sent to PCP. Dose reduced per creatinine level.

## 2017-06-01 NOTE — ED Notes (Signed)
Pt has had weakness for the last several months with increasing the last several weeks  Loss of 10 lbs  No appetite  Now cannot do his ADLs without assistance from family  Has seen his cardiologist, and has appt with pulm tomorrow

## 2017-06-01 NOTE — ED Provider Notes (Addendum)
[name] EMERGENCY DEPARTMENT Provider Note   CSN: [id number] Arrival date & time: 06/01/17  1140     History   Chief Complaint Chief Complaint  Patient presents with  . Weakness    HPI [name] is a 84 y.o. male.  He presents for evaluation of "leg weakness,", with dyspnea especially on exertion, gradual onset over the last several weeks and worse.  Describes a 10 pound weight loss which is unintentional.  He states that he does not eat much because he has "lost my appetite."  He saw his cardiologist, 1 week ago and had his metoprolol lowered from 50 twice daily to 25 twice daily.  He has followed this advice and has not had improvement of his leg weakness.  He states that this morning he checked his blood pressure and it was low, 98/60.  His blood pressure typically runs around 115/70, but has been lower than that for the last 2 days.  He denies fever, chills, cough, focal weakness or paresthesia.  He is taking his medication as prescribed.  He has a follow-up appointment with his pulmonary physician tomorrow.  Started was started on CPAP, for sleep apnea, 3 nights ago.  He has had some difficulty with the mask, but feels that he is sleeping better with the CPAP.  There are no other known modifying factors.  HPI  Past Medical History:  Diagnosis Date  . Atrial fibrillation ([place])    a. s/p DCCV in 12/2016, remains on Coumadin and Amiodarone  . CAD (coronary artery disease)    a. s/p POBA of D1 in 2009 with residual 20-30% stenosis along RCA and LAD  . Chronic kidney disease (CKD), stage III (moderate) (HCC)   . Dysrhythmia    AFib  . Essential hypertension, benign   . History of cardiac catheterization    Details not certain - reportedly no interventions  . Hypothyroidism   . Macular degeneration   . Pulmonary hypertension ([place]) 12/03/2013   PA pressure 56. Ejection fraction 55-60%  . Type 2 diabetes mellitus [name])     Patient Active Problem List   Diagnosis Date  Noted  . CHF exacerbation ([place]) 12/20/2016  . Acute congestive heart failure ([place])   . Acute on chronic diastolic CHF (congestive heart failure) ([place]) 10/24/2016  . Jaundice   . Periumbilical pain   . Elevated lipase   . Coagulopathy ([place]) 10/16/2016  . Elevated liver enzymes   . Hyperbilirubinemia   . Hyponatremia   . Abdominal pain 10/15/2016  . A-fib ([place]) 02/23/2014  . Diabetes ([place]) 02/23/2014  . BP (high blood pressure) 02/23/2014  . HLD (hyperlipidemia) 02/23/2014  . Excess weight 02/23/2014  . Chronic diastolic heart failure ([place]) 12/24/2013  . Acute bronchitis 12/06/2013  . DIC (disseminated intravascular coagulation) ([place]) 12/04/2013  . Thrombocytopenia, unspecified ([place]) 12/03/2013  . Pulmonary hypertension ([place]) 12/03/2013  . Wheezing 12/02/2013  . Obesity, morbid ([place]) 12/02/2013  . Transaminitis 12/02/2013  . Type 2 diabetes mellitus with diabetic nephropathy ([place]) 12/01/2013  . Hypertension 12/01/2013  . Atrial fibrillation with RVR ([place]) 12/01/2013  . Gall stones, common bile duct 12/01/2013  . Pancreatitis due to biliary obstruction 12/01/2013  . Chronic kidney disease (CKD), stage III (moderate) ([place]) 12/01/2013  . Hypothyroidism 12/01/2013  . Cholelithiasis 12/01/2013  . History of other specified conditions presenting hazards to health 02/09/2008  . Lumbar back sprain 02/09/2008  . Herpes zoster keratoconjunctivitis 09/15/2007  . Acute systolic heart failure ([place]) 08/20/2007  . PNA (pneumonia)  08/20/2007  . Fast heart beat 08/20/2007  . Feeling bilious 08/16/2007    Past Surgical History:  Procedure Laterality Date  . CARDIOVERSION N/A 12/10/2013   Procedure: CARDIOVERSION;  Surgeon: Herminio Commons, MD;  Location: [facility] ORS;  Service: Endoscopy;  Laterality: N/A;  . CARDIOVERSION N/A 11/15/2016   Procedure: CARDIOVERSION;  Surgeon: Arnoldo Lenis, MD;  Location: [facility] ORS;  Service: Endoscopy;  Laterality: N/A;  . CARDIOVERSION N/A 12/18/2016    Procedure: CARDIOVERSION;  Surgeon: Arnoldo Lenis, MD;  Location: [facility] ENDO SUITE;  Service: Endoscopy;  Laterality: N/A;  pt knows to arrive at 8:00  . CATARACT EXTRACTION W/PHACO Left 11/13/2015   Procedure: CATARACT EXTRACTION PHACO AND INTRAOCULAR LENS PLACEMENT LEFT EYE CDE=7.76;  Surgeon: Williams Che, MD;  Location: [facility] ORS;  Service: Ophthalmology;  Laterality: Left;  . CHOLECYSTECTOMY    . COLONOSCOPY     Remote > 10 years ago  . ERCP N/A 10/17/2016   Dr. Gala Romney: sphincterotomy and stone extraction   . REMOVAL OF STONES  10/17/2016   Procedure: REMOVAL OF STONES with balloon;  Surgeon: Daneil Dolin, MD;  Location: [facility] ENDO SUITE;  Service: Endoscopy;;  . REPLACEMENT TOTAL HIP W/  RESURFACING IMPLANTS Bilateral   . SPHINCTEROTOMY  10/17/2016   Procedure: BILLARY SPHINCTEROTOMY with ballon dilation;  Surgeon: Daneil Dolin, MD;  Location: [facility] END[facility] SUITE;  Service: Endoscopy;;  . TEE WITHOUT CARDIOVERSION N/A 12/10/2013   Procedure: TRANSESOPHAGEAL ECHOCARDIOGRAM (TEE);  Surgeon: Herminio Commons, MD;  Location: [facility] ORS;  Service: Endoscopy;  Laterality: N/A;       Home Medications    Prior to Admission medications   Medication Sig Start Date End Date Taking? Authorizing Provider  albuterol (PROVENTIL HFA;VENTOLIN HFA) 108 (90 Base) MCG/ACT inhaler Inhale 1-2 puffs into the lungs every 6 (six) hours as needed for wheezing or shortness of breath.   Yes [provider]  amiodarone (PACERONE) 200 MG tablet Take 1 tablet (200 mg total) by mouth daily. 04/22/17  Yes Strader, Fransisco Hertz, PA-C  aspirin EC 81 MG EC tablet Take 1 tablet (81 mg total) by mouth daily. 12/23/16  Yes Isaac Bliss, Rayford Halsted, MD  b complex vitamins tablet Take 1 tablet by mouth daily.   Yes [provider]  diphenhydrAMINE (BENADRYL) 12.5 MG/5ML elixir Take 50 mg by mouth at bedtime as needed for allergies or sleep.   Yes [provider]  doxazosin (CARDURA) 2 MG tablet Take 1 tablet  (2 mg total) by mouth at bedtime. 04/24/15  Yes Herminio Commons, MD  ferrous sulfate 325 (65 FE) MG tablet Take 1 tablet (325 mg total) by mouth daily. 04/14/17  Yes Nat Christen, MD  furosemide (LASIX) 40 MG tablet Take 1 tablet (40 mg total) by mouth daily. 12/22/16 06/01/17 Yes Erline Hau, MD  levothyroxine (SYNTHROID, LEVOTHROID) 50 MCG tablet Take 50 mcg by mouth daily before breakfast.    Yes [provider]  metFORMIN (GLUCOPHAGE) 500 MG tablet Take 1 tablet (500 mg total) by mouth 2 (two) times daily with a meal. 11/16/16  Yes Orvan Falconer, MD  metoprolol tartrate (LOPRESSOR) 25 MG tablet Take 1 tablet (25 mg total) by mouth 2 (two) times daily. 05/26/17  Yes Herminio Commons, MD  warfarin (COUMADIN) 3 MG tablet Take 1 tablet (3 mg total) by mouth daily at 6 PM. Patient taking differently: Take 3 mg by mouth daily at 6 PM. 1/2 tablet on Monday, Thursday 10/20/16  Yes  Orson Eva, MD  MELATONIN PO Take 4 mg by mouth at bedtime. 2 mg tablets    [provider]    Family History Family History  Problem Relation Age of Onset  . Hypertension Mother   . Hypertension Father   . Colon cancer Neg Hx     Social History Social History   Tobacco Use  . Smoking status: Former Smoker    Packs/day: 1.00    Years: 37.00    Pack years: 37.00    Types: Cigarettes    Start date: 11/01/1952    Last attempt to quit: 08/18/1978    Years since quitting: 38.8  . Smokeless tobacco: Never Used  Substance Use Topics  . Alcohol use: Yes    Alcohol/week: 0.0 oz    Comment: Occasional  . Drug use: No     Allergies   Patient has no known allergies.   Review of Systems Review of Systems  All other systems reviewed and are negative.    Physical Exam Updated Vital Signs BP 100/86   Pulse 88   Temp 98.2 F (36.8 C) (Oral)   Resp 13   Ht 6' (1.829 m)   Wt 88.5 kg (195 lb)   SpO2 97%   BMI 26.45 kg/m   Physical Exam  Constitutional: He is oriented to  person, place, and time. He appears well-developed. No distress.  Elderly, frail  HENT:  Head: Normocephalic and atraumatic.  Right Ear: External ear normal.  Left Ear: External ear normal.  Eyes: Conjunctivae and EOM are normal. Pupils are equal, round, and reactive to light.  Neck: Normal range of motion and phonation normal. Neck supple.  Cardiovascular: Normal rate, regular rhythm and normal heart sounds.  Pulmonary/Chest: Effort normal. No stridor. No respiratory distress. He has no wheezes. He exhibits no bony tenderness.  Somewhat decreased air movement bilaterally.  Abdominal: Soft. There is no tenderness.  Musculoskeletal: Normal range of motion. He exhibits edema (1+ lower legs bilateral).  Neurological: He is alert and oriented to person, place, and time. No cranial nerve deficit or sensory deficit. He exhibits normal muscle tone. Coordination normal.  Skin: Skin is warm, dry and intact.  Psychiatric: He has a normal mood and affect. His behavior is normal. Judgment and thought content normal.  Nursing note and vitals reviewed.    ED Treatments / Results  Labs (all labs ordered are listed, but only abnormal results are displayed) Labs Reviewed  CBC WITH DIFFERENTIAL/PLATELET - Abnormal; Notable for the following components:      Result Value   Hemoglobin 11.9 (*)    HCT 37.3 (*)    RDW 16.4 (*)    All other components within normal limits  COMPREHENSIVE METABOLIC PANEL - Abnormal; Notable for the following components:   Sodium 132 (*)    Chloride 95 (*)    Creatinine, Ser 1.80 (*)    AST 64 (*)    GFR calc non Af Amer 33 (*)    GFR calc Af Amer 39 (*)    All other components within normal limits  D-DIMER, QUANTITATIVE (NOT AT [name]) - Abnormal; Notable for the following components:   D-Dimer, Quant 4.65 (*)    All other components within normal limits  PROTIME-INR - Abnormal; Notable for the following components:   Prothrombin Time 35.9 (*)    All other components  within normal limits  BRAIN NATRIURETIC PEPTIDE - Abnormal; Notable for the following components:   B Natriuretic Peptide 282.0 (*)  All other components within normal limits  TROPONIN I   BUN  Date Value Ref Range Status  06/01/2017 18 6 - 20 mg/dL Final  04/14/2017 25 (H) 6 - 20 mg/dL Final  12/22/2016 22 (H) 6 - 20 mg/dL Final  12/21/2016 20 6 - 20 mg/dL Final   Creatinine, Ser  Date Value Ref Range Status  06/01/2017 1.80 (H) 0.61 - 1.24 mg/dL Final  04/14/2017 1.73 (H) 0.61 - 1.24 mg/dL Final  12/22/2016 1.46 (H) 0.61 - 1.24 mg/dL Final  12/21/2016 1.17 0.61 - 1.24 mg/dL Final     EKG  EKG Interpretation  Date/Time:  Sunday June 01 2017 12:09:32 EST Ventricular Rate:  73 PR Interval:    QRS Duration: 136 QT Interval:  553 QTC Calculation: 584 R Axis:   -49 Text Interpretation:  Atrial fibrillation Ventricular bigeminy RBBB and LAFB Nonspecific T abnormalities, lateral leads Since last tracing Atrial fibrillation and PVC are new Confirmed by Daleen Bo [id number]) on 06/01/2017 1:41:29 PM       Radiology Dg Chest 1 View  Result Date: 06/01/2017 CLINICAL DATA:  Generalized weakness 2 weeks. EXAM: CHEST 1 VIEW COMPARISON:  04/14/2017 FINDINGS: Lungs are adequately inflated without focal consolidation or effusion. Calcified granuloma over the lateral left midlung. Mild stable cardiomegaly. Remainder the exam is unchanged. IMPRESSION: No active disease. Electronically Signed   By: Marin Olp M.D.   On: 06/01/2017 12:56   Ct Angio Chest Pe W/cm &/or Wo Cm  Result Date: 06/01/2017 CLINICAL DATA:  Shortness of breath and weakness. EXAM: CT ANGIOGRAPHY CHEST WITH CONTRAST TECHNIQUE: Multidetector CT imaging of the chest was performed using the standard protocol during bolus administration of intravenous contrast. Multiplanar CT image reconstructions and MIPs were obtained to evaluate the vascular anatomy. CONTRAST:  43mL ISOVUE-370 IOPAMIDOL (ISOVUE-370) INJECTION 76%  COMPARISON:  Chest x-ray June 01, 2017 FINDINGS: Cardiovascular: Cardiomegaly is identified as are coronary artery calcifications in both the right and left coronary arteries. The thoracic aorta measures 4.2 cm near the root. Mild atherosclerotic change seen in the thoracic aorta. No dissection although evaluation for dissection is mildly limited. No pulmonary emboli. Mediastinum/Nodes: Tiny pleural effusions, right greater than left, are identified. Pleural calcifications are seen, right greater than left, likely from previous asbestos exposure. No pericardial effusion. The thyroid and esophagus are normal. No adenopathy. Lungs/Pleura: Central airways are normal. No pneumothorax. There is a calcified nodule in the lateral left lung on series 6, image 50 consistent previous granulomatous disease. No suspicious nodules or masses. No overt edema. No focal infiltrate. Upper Abdomen: Previous cholecystectomy. Low-attenuation in the right hepatic lobe on series 4, image 81 likely represents a confluence of vessels and dilated intrahepatic ducts, also seen on the CT scan from October 15, 2016. No acute abnormalities in the upper abdomen. Musculoskeletal: No chest wall abnormality. No acute or significant osseous findings. Review of the MIP images confirms the above findings. IMPRESSION: 1. No pulmonary emboli. 2. Cardiomegaly and small effusions. 3. Coronary artery calcifications. 4. Mild aneurysmal dilatation of the ascending thoracic aorta measuring 4.2 cm. Recommend annual imaging followup by CTA or MRA. This recommendation follows 2010 ACCF/AHA/AATS/ACR/ASA/SCA/SCAI/SIR/STS/SVM Guidelines for the Diagnosis and Management of Patients with Thoracic Aortic Disease. Circulation. 2010; 121: [id number] 5. Mild atherosclerotic change in the thoracic aorta. 6. Pleural calcifications, likely from previous asbestos exposure. Aortic Atherosclerosis (ICD10-I70.0). Electronically Signed   By: Dorise Bullion III M.D   On:  06/01/2017 14:44    Procedures Procedures (including critical care time)  Medications  Ordered in ED Medications  iopamidol (ISOVUE-370) 76 % injection 80 mL (80 mLs Intravenous Contrast Given 06/01/17 1416)     Initial Impression / Assessment and Plan / ED Course  I have reviewed the triage vital signs and the nursing notes.  Pertinent labs & imaging results that were available during my care of the patient were reviewed by me and considered in my medical decision making (see chart for details).  Clinical Course as of Jun 01 1498  Sun Jun 01, 2017  1253 Initial evaluation consistent with nonspecific malaise.  Possible hypoxia, complicated by lower blood pressure than usual.  Will evaluate comprehensively for pulmonary, cardiac, metabolic and infectious processes.  [EW]  1341 Mild elevation B Natriuretic Peptide: (!) 282.0 [EW]  1342 Normal WBC: 5.1 [EW]  1342 Elevated Creatinine: (!) 1.80 [EW]  1342 High  [EW]  1343 Somewhat high INR INR: 3.63 [EW]    Clinical Course User Index [EW] Daleen Bo, MD     Patient Vitals for the past 24 hrs:  BP Temp Temp src Pulse Resp SpO2 Height Weight  06/01/17 1430 100/86 - - 88 13 97 % - -  06/01/17 1400 (!) 115/100 - - 84 15 100 % - -  06/01/17 1300 (!) 90/55 - - 85 20 100 % - -  06/01/17 1257 104/67 - - 67 18 100 % - -  06/01/17 1230 - - - 67 15 100 % - -  06/01/17 1200 115/71 - - 79 (!) 21 97 % - -  06/01/17 1147 - - - - - - 6' (1.829 m) 88.5 kg (195 lb)  06/01/17 1146 94/61 98.2 F (36.8 C) Oral 82 15 90 % - -    3:40 PM Reevaluation with update and discussion. After initial assessment and treatment, an updated evaluation reveals patient is alert, calm and cooperative.  On room air, oxygen saturation 94%.  No dyspnea or increased work of breathing at this time.  Findings discussed with patient and his "girlfriend," all questions answered. Daleen Bo      Final Clinical Impressions(s) / ED Diagnoses   Final diagnoses:    Weakness   Weakness with difficulty walking and shortness of breath, likely multifactorial.  Screening evaluations do not indicate acute pulmonary edema, PE, pneumonia, or serious bacterial infection.  Mild dehydration present evidenced by orthostasis, lowering of blood pressure and increasing pulse, with relatively stable mild renal insufficiency.  The patient was symptomatic with weakness and dizziness, when orthostatic vital signs were checked.  Has history of heart failure, with last cardiac echo, reassuring, ejection fraction 50-55%, with mild LVH, and dilated atria.  Recurrent atrial fibrillation, anticoagulated with warfarin, INR 3.6.  Nursing Notes Reviewed/ Care Coordinated Applicable Imaging Reviewed Interpretation of Laboratory Data incorporated into ED treatment  The patient appears reasonably screened and/or stabilized for discharge and I doubt any other medical condition or other [facility] requiring further screening, evaluation, or treatment in the ED at this time prior to discharge.  Plan: Home Medications-continue usual except metoprolol, discontinue; Home Treatments-rest, fluids, try to eat 3 meals a day and drink 1500 cc of fluid; return here if the recommended treatment, does not improve the symptoms; Recommended follow up-PCP for follow-up care regarding general weakness, cardiology for follow-up care regarding blood pressure and heart rate, following cessation of metoprolol.    ED Discharge Orders    None       Daleen Bo, MD 06/01/17 1605  Long telephone conversation with daughter who has not been here,  previously.  She is concerned that her son, who was with the patient initially, is upset that his grandfather is ill and will die soon.  Patient's daughter wonders if he should be on hospice care.  I agreed to initiate home health evaluation with PT, RN, aide and social worker, by consulting care management.  She seemed appreciative of that.             Daleen Bo, MD 06/01/17 2032

## 2017-06-01 NOTE — ED Notes (Signed)
Call to lab re additional labs

## 2017-06-01 NOTE — ED Notes (Signed)
Dr Eulis Foster in to assess

## 2017-06-02 DIAGNOSIS — I10 Essential (primary) hypertension: Secondary | ICD-10-CM | POA: Diagnosis not present

## 2017-06-02 DIAGNOSIS — I4891 Unspecified atrial fibrillation: Secondary | ICD-10-CM | POA: Diagnosis not present

## 2017-06-02 DIAGNOSIS — I509 Heart failure, unspecified: Secondary | ICD-10-CM | POA: Diagnosis not present

## 2017-06-02 DIAGNOSIS — J449 Chronic obstructive pulmonary disease, unspecified: Secondary | ICD-10-CM | POA: Diagnosis not present

## 2017-06-03 DIAGNOSIS — Z7982 Long term (current) use of aspirin: Secondary | ICD-10-CM | POA: Diagnosis not present

## 2017-06-03 DIAGNOSIS — Z7901 Long term (current) use of anticoagulants: Secondary | ICD-10-CM | POA: Diagnosis not present

## 2017-06-03 DIAGNOSIS — I509 Heart failure, unspecified: Secondary | ICD-10-CM | POA: Diagnosis not present

## 2017-06-03 DIAGNOSIS — R269 Unspecified abnormalities of gait and mobility: Secondary | ICD-10-CM | POA: Diagnosis not present

## 2017-06-03 DIAGNOSIS — G3184 Mild cognitive impairment, so stated: Secondary | ICD-10-CM | POA: Diagnosis not present

## 2017-06-03 DIAGNOSIS — I4891 Unspecified atrial fibrillation: Secondary | ICD-10-CM | POA: Diagnosis not present

## 2017-06-03 DIAGNOSIS — E119 Type 2 diabetes mellitus without complications: Secondary | ICD-10-CM | POA: Diagnosis not present

## 2017-06-03 DIAGNOSIS — J439 Emphysema, unspecified: Secondary | ICD-10-CM | POA: Diagnosis not present

## 2017-06-03 DIAGNOSIS — J45909 Unspecified asthma, uncomplicated: Secondary | ICD-10-CM | POA: Diagnosis not present

## 2017-06-03 DIAGNOSIS — E039 Hypothyroidism, unspecified: Secondary | ICD-10-CM | POA: Diagnosis not present

## 2017-06-08 ENCOUNTER — Encounter (HOSPITAL_COMMUNITY): Payer: Self-pay | Admitting: Emergency Medicine

## 2017-06-08 ENCOUNTER — Emergency Department (HOSPITAL_COMMUNITY)
Admission: EM | Admit: 2017-06-08 | Discharge: 2017-06-08 | Disposition: A | Discharge status: Home / Self Care | Payer: Medicare HMO | Attending: Emergency Medicine | Admitting: Emergency Medicine

## 2017-06-08 ENCOUNTER — Other Ambulatory Visit: Payer: Self-pay

## 2017-06-08 ENCOUNTER — Emergency Department (HOSPITAL_COMMUNITY): Payer: Medicare HMO

## 2017-06-08 DIAGNOSIS — Z87891 Personal history of nicotine dependence: Secondary | ICD-10-CM | POA: Insufficient documentation

## 2017-06-08 DIAGNOSIS — J449 Chronic obstructive pulmonary disease, unspecified: Secondary | ICD-10-CM | POA: Diagnosis not present

## 2017-06-08 DIAGNOSIS — R0602 Shortness of breath: Secondary | ICD-10-CM | POA: Insufficient documentation

## 2017-06-08 DIAGNOSIS — I5032 Chronic diastolic (congestive) heart failure: Secondary | ICD-10-CM | POA: Diagnosis not present

## 2017-06-08 DIAGNOSIS — E119 Type 2 diabetes mellitus without complications: Secondary | ICD-10-CM | POA: Insufficient documentation

## 2017-06-08 DIAGNOSIS — I2510 Atherosclerotic heart disease of native coronary artery without angina pectoris: Secondary | ICD-10-CM | POA: Insufficient documentation

## 2017-06-08 DIAGNOSIS — J90 Pleural effusion, not elsewhere classified: Secondary | ICD-10-CM | POA: Diagnosis not present

## 2017-06-08 DIAGNOSIS — Z7982 Long term (current) use of aspirin: Secondary | ICD-10-CM | POA: Diagnosis not present

## 2017-06-08 DIAGNOSIS — Z7984 Long term (current) use of oral hypoglycemic drugs: Secondary | ICD-10-CM | POA: Diagnosis not present

## 2017-06-08 DIAGNOSIS — M6281 Muscle weakness (generalized): Secondary | ICD-10-CM | POA: Diagnosis present

## 2017-06-08 DIAGNOSIS — Z79899 Other long term (current) drug therapy: Secondary | ICD-10-CM | POA: Diagnosis not present

## 2017-06-08 DIAGNOSIS — J418 Mixed simple and mucopurulent chronic bronchitis: Secondary | ICD-10-CM | POA: Diagnosis not present

## 2017-06-08 DIAGNOSIS — I130 Hypertensive heart and chronic kidney disease with heart failure and stage 1 through stage 4 chronic kidney disease, or unspecified chronic kidney disease: Secondary | ICD-10-CM | POA: Diagnosis not present

## 2017-06-08 DIAGNOSIS — I4891 Unspecified atrial fibrillation: Secondary | ICD-10-CM | POA: Diagnosis not present

## 2017-06-08 DIAGNOSIS — N183 Chronic kidney disease, stage 3 (moderate): Secondary | ICD-10-CM | POA: Diagnosis not present

## 2017-06-08 LAB — BASIC METABOLIC PANEL
Anion gap: 8 (ref 5–15)
BUN: 29 mg/dL — AB (ref 6–20)
CALCIUM: 9.1 mg/dL (ref 8.9–10.3)
CHLORIDE: 92 mmol/L — AB (ref 101–111)
CO2: 28 mmol/L (ref 22–32)
CREATININE: 1.86 mg/dL — AB (ref 0.61–1.24)
GFR, EST AFRICAN AMERICAN: 37 mL/min — AB (ref >60)
GFR, EST NON AFRICAN AMERICAN: 32 mL/min — AB (ref >60)
Glucose, Bld: 94 mg/dL (ref 65–99)
Potassium: 4.3 mmol/L (ref 3.5–5.1)
SODIUM: 128 mmol/L — AB (ref 135–145)

## 2017-06-08 LAB — HEPATIC FUNCTION PANEL
ALK PHOS: 86 U/L (ref 38–126)
ALT: 35 U/L (ref 17–63)
AST: 60 U/L — ABNORMAL HIGH (ref 15–41)
Albumin: 3.4 g/dL — ABNORMAL LOW (ref 3.5–5.0)
BILIRUBIN INDIRECT: 0.5 mg/dL (ref 0.3–0.9)
Bilirubin, Direct: 0.2 mg/dL (ref 0.1–0.5)
Total Bilirubin: 0.7 mg/dL (ref 0.3–1.2)
Total Protein: 6.1 g/dL — ABNORMAL LOW (ref 6.5–8.1)

## 2017-06-08 LAB — CBC
HCT: 31.9 % — ABNORMAL LOW (ref 39.0–52.0)
Hemoglobin: 10.3 g/dL — ABNORMAL LOW (ref 13.0–17.0)
MCH: 28.2 pg (ref 26.0–34.0)
MCHC: 32.3 g/dL (ref 30.0–36.0)
MCV: 87.4 fL (ref 78.0–100.0)
PLATELETS: 271 10*3/uL (ref 150–400)
RBC: 3.65 MIL/uL — AB (ref 4.22–5.81)
RDW: 16.6 % — AB (ref 11.5–15.5)
WBC: 5.3 10*3/uL (ref 4.0–10.5)

## 2017-06-08 LAB — DIFFERENTIAL
BASOS ABS: 0.1 10*3/uL (ref 0.0–0.1)
Basophils Relative: 2 %
EOS PCT: 15 %
Eosinophils Absolute: 0.8 10*3/uL — ABNORMAL HIGH (ref 0.0–0.7)
Lymphocytes Relative: 33 %
Lymphs Abs: 1.7 10*3/uL (ref 0.7–4.0)
Monocytes Absolute: 0.6 10*3/uL (ref 0.1–1.0)
Monocytes Relative: 11 %
NEUTROS PCT: 39 %
Neutro Abs: 2 10*3/uL (ref 1.7–7.7)

## 2017-06-08 LAB — TROPONIN I

## 2017-06-08 LAB — BRAIN NATRIURETIC PEPTIDE: B NATRIURETIC PEPTIDE 5: 376 pg/mL — AB (ref 0.0–100.0)

## 2017-06-08 NOTE — ED Triage Notes (Signed)
Patient c/o shortness of breath and generalized weakness x4 weeks. Patient states seen here in ED twice but is progressively getting worse. Denies any chest pain. Reports swelling in ankle bilateral . Hx of afib.

## 2017-06-08 NOTE — ED Notes (Signed)
Dr Earnest Conroy in to discuss w family

## 2017-06-08 NOTE — ED Provider Notes (Signed)
[facility] EMERGENCY DEPARTMENT Provider Note   CSN: [id number] Arrival date & time: 06/08/17  1032     History   Chief Complaint Chief Complaint  Patient presents with  . Shortness of Breath    HPI Brett [name] is a 84 y.o. male.  Patient complains of weakness.  She has COPD and heart failure and his cardiologist decreased his beta-blocker 2 weeks ago because of the weakness   The history is provided by the patient. No language interpreter was used.  Weakness  Primary symptoms include no focal weakness. This is a recurrent problem. The current episode started more than 2 days ago. The problem has not changed since onset.There was no focality noted. There has been no fever. Associated symptoms include shortness of breath. Pertinent negatives include no chest pain and no headaches.    Past Medical History:  Diagnosis Date  . Atrial fibrillation ([place])    a. s/p DCCV in 12/2016, remains on Coumadin and Amiodarone  . CAD (coronary artery disease)    a. s/p POBA of D1 in 2009 with residual 20-30% stenosis along RCA and LAD  . Chronic kidney disease (CKD), stage III (moderate) (HCC)   . Dysrhythmia    AFib  . Essential hypertension, benign   . History of cardiac catheterization    Details not certain - reportedly no interventions  . Hypothyroidism   . Macular degeneration   . Pulmonary hypertension ([place]) 12/03/2013   PA pressure 56. Ejection fraction 55-60%  . Type 2 diabetes mellitus [facility])     Patient Active Problem List   Diagnosis Date Noted  . CHF exacerbation ([place]) 12/20/2016  . Acute congestive heart failure ([place])   . Acute on chronic diastolic CHF (congestive heart failure) ([place]) 10/24/2016  . Jaundice   . Periumbilical pain   . Elevated lipase   . Coagulopathy ([place]) 10/16/2016  . Elevated liver enzymes   . Hyperbilirubinemia   . Hyponatremia   . Abdominal pain 10/15/2016  . A-fib ([place]) 02/23/2014  . Diabetes ([place]) 02/23/2014  . BP (high blood  pressure) 02/23/2014  . HLD (hyperlipidemia) 02/23/2014  . Excess weight 02/23/2014  . Chronic diastolic heart failure ([place]) 12/24/2013  . Acute bronchitis 12/06/2013  . DIC (disseminated intravascular coagulation) ([place]) 12/04/2013  . Thrombocytopenia, unspecified ([place]) 12/03/2013  . Pulmonary hypertension ([place]) 12/03/2013  . Wheezing 12/02/2013  . Obesity, morbid ([place]) 12/02/2013  . Transaminitis 12/02/2013  . Type 2 diabetes mellitus with diabetic nephropathy ([place]) 12/01/2013  . Hypertension 12/01/2013  . Atrial fibrillation with RVR ([place]) 12/01/2013  . Gall stones, common bile duct 12/01/2013  . Pancreatitis due to biliary obstruction 12/01/2013  . Chronic kidney disease (CKD), stage III (moderate) ([place]) 12/01/2013  . Hypothyroidism 12/01/2013  . Cholelithiasis 12/01/2013  . History of other specified conditions presenting hazards to health 02/09/2008  . Lumbar back sprain 02/09/2008  . Herpes zoster keratoconjunctivitis 09/15/2007  . Acute systolic heart failure ([place]) 08/20/2007  . PNA (pneumonia) 08/20/2007  . Fast heart beat 08/20/2007  . Feeling bilious 08/16/2007    Past Surgical History:  Procedure Laterality Date  . CARDIOVERSION N/A 12/10/2013   Procedure: CARDIOVERSION;  Surgeon: Herminio Commons, MD;  Location: [facility] ORS;  Service: Endoscopy;  Laterality: N/A;  . CARDIOVERSION N/A 11/15/2016   Procedure: CARDIOVERSION;  Surgeon: Arnoldo Lenis, MD;  Location: [facility] ORS;  Service: Endoscopy;  Laterality: N/A;  . CARDIOVERSION N/A 12/18/2016   Procedure: CARDIOVERSION;  Surgeon: Arnoldo Lenis, MD;  Location: [facility] ENDO  SUITE;  Service: Endoscopy;  Laterality: N/A;  pt knows to arrive at 8:00  . CATARACT EXTRACTION W/PHACO Left 11/13/2015   Procedure: CATARACT EXTRACTION PHACO AND INTRAOCULAR LENS PLACEMENT LEFT EYE CDE=7.76;  Surgeon: Williams Che, MD;  Location: [facility] ORS;  Service: Ophthalmology;  Laterality: Left;  . CHOLECYSTECTOMY    . COLONOSCOPY     Remote > 10  years ago  . ERCP N/A 10/17/2016   Dr. Gala Romney: sphincterotomy and stone extraction   . REMOVAL OF STONES  10/17/2016   Procedure: REMOVAL OF STONES with balloon;  Surgeon: Daneil Dolin, MD;  Location: [facility] ENDO SU[facility];  Service: Endoscopy;;  . REPLACEMENT TOTAL HIP W/  RESURFACING IMPLANTS Bilateral   . SPHINCTEROTOMY  10/17/2016   Procedure: BILLARY SPHINCTEROTOMY with ballon dilation;  Surgeon: Daneil Dolin, MD;  Location: [facility] END[facility] SU[facility];  Service: Endoscopy;;  . TEE WITHOUT CARDIOVERSION N/A 12/10/2013   Procedure: TRANSESOPHAGEAL ECHOCARDIOGRAM (TEE);  Surgeon: Herminio Commons, MD;  Location: [facility] ORS;  Service: Endoscopy;  Laterality: N/A;       Home Medications    Prior to Admission medications   Medication Sig Start Date End Date Taking? Authorizing Provider  albuterol (PROVENTIL HFA;VENTOLIN HFA) 108 (90 Base) MCG/ACT inhaler Inhale 1-2 puffs into the lungs every 6 (six) hours as needed for wheezing or shortness of breath.   Yes [provider]  amiodarone (PACERONE) 200 MG tablet Take 1 tablet (200 mg total) by mouth daily. 04/22/17  Yes Strader, Fransisco Hertz, PA-C  aspirin EC 81 MG EC tablet Take 1 tablet (81 mg total) by mouth daily. 12/23/16  Yes Isaac Bliss, Rayford Halsted, MD  b complex vitamins tablet Take 1 tablet by mouth daily.   Yes [provider]  diphenhydrAMINE (BENADRYL) 12.5 MG/5ML elixir Take 50 mg by mouth at bedtime as needed for allergies or sleep.   Yes [provider]  doxazosin (CARDURA) 2 MG tablet Take 1 tablet (2 mg total) by mouth at bedtime. 04/24/15  Yes Herminio Commons, MD  ferrous sulfate 325 (65 FE) MG tablet Take 1 tablet (325 mg total) by mouth daily. 04/14/17  Yes Nat Christen, MD  furosemide (LASIX) 40 MG tablet Take 1 tablet (40 mg total) by mouth daily. 12/22/16 06/08/17 Yes Erline Hau, MD  levothyroxine (SYNTHROID, LEVOTHROID) 50 MCG tablet Take 50 mcg by mouth daily before breakfast.    Yes [provider]  MELATONIN PO Take 4 mg by mouth at bedtime. 2 mg tablets   Yes [provider]  metFORMIN (GLUCOPHAGE) 500 MG tablet Take 1 tablet (500 mg total) by mouth 2 (two) times daily with a meal. 11/16/16  Yes Orvan Falconer, MD  tiotropium (SPIRIVA) 18 MCG inhalation capsule Place 18 mcg into inhaler and inhale daily.   Yes [provider]  warfarin (COUMADIN) 3 MG tablet Take 1 tablet (3 mg total) by mouth daily at 6 PM. Patient taking differently: Take 3 mg by mouth daily at 6 PM. 1/2 tablet on Monday, Thursday 10/20/16  Yes Tat, Shanon Brow, MD    Family History Family History  Problem Relation Age of Onset  . Hypertension Mother   . Hypertension Father   . Colon cancer Neg Hx     Social History Social History   Tobacco Use  . Smoking status: Former Smoker    Packs/day: 1.00    Years: 37.00    Pack years: 37.00    Types: Cigarettes    Start date: 11/01/1952  Last attempt to quit: 08/18/1978    Years since quitting: 38.8  . Smokeless tobacco: Never Used  Substance Use Topics  . Alcohol use: Yes    Alcohol/week: 0.0 oz    Comment: Occasional  . Drug use: No     Allergies   Patient has no known allergies.   Review of Systems Review of Systems  Constitutional: Negative for appetite change and fatigue.  HENT: Negative for congestion, ear discharge and sinus pressure.   Eyes: Negative for discharge.  Respiratory: Positive for shortness of breath. Negative for cough.   Cardiovascular: Negative for chest pain.  Gastrointestinal: Negative for abdominal pain and diarrhea.  Genitourinary: Negative for frequency and hematuria.  Musculoskeletal: Negative for back pain.  Skin: Negative for rash.  Neurological: Positive for weakness. Negative for focal weakness, seizures and headaches.  Psychiatric/Behavioral: Negative for hallucinations.     Physical Exam Updated Vital Signs BP 113/62   Pulse 93   Temp 97.9 F (36.6 C) (Oral)   Resp 13   Ht 6' (1.829 m)    Wt 88.5 kg (195 lb)   SpO2 99%   BMI 26.45 kg/m   Physical Exam  Constitutional: He is oriented to person, place, and time. He appears well-developed.  HENT:  Head: Normocephalic.  Eyes: Conjunctivae and EOM are normal. No scleral icterus.  Neck: Neck supple. No thyromegaly present.  Cardiovascular: Normal rate and regular rhythm. Exam reveals no gallop and no friction rub.  No murmur heard. Pulmonary/Chest: No stridor. He has no wheezes. He has no rales. He exhibits no tenderness.  Abdominal: He exhibits no distension. There is no tenderness. There is no rebound.  Musculoskeletal: Normal range of motion. He exhibits no edema.  Lymphadenopathy:    He has no cervical adenopathy.  Neurological: He is oriented to person, place, and time. He exhibits normal muscle tone. Coordination normal.  Skin: No rash noted. No erythema.  Psychiatric: He has a normal mood and affect. His behavior is normal.  Nursing note and vitals reviewed.    ED Treatments / Results  Labs (all labs ordered are listed, but only abnormal results are displayed) Labs Reviewed  BASIC METABOLIC PANEL - Abnormal; Notable for the following components:      Result Value   Sodium 128 (*)    Chloride 92 (*)    BUN 29 (*)    Creatinine, Ser 1.86 (*)    GFR calc non Af Amer 32 (*)    GFR calc Af Amer 37 (*)    All other components within normal limits  CBC - Abnormal; Notable for the following components:   RBC 3.65 (*)    Hemoglobin 10.3 (*)    HCT 31.9 (*)    RDW 16.6 (*)    All other components within normal limits  BRAIN NATRIURETIC PEPTIDE - Abnormal; Notable for the following components:   B Natriuretic Peptide 376.0 (*)    All other components within normal limits  HEPATIC FUNCTION PANEL - Abnormal; Notable for the following components:   Total Protein 6.1 (*)    Albumin 3.4 (*)    AST 60 (*)    All other components within normal limits  DIFFERENTIAL - Abnormal; Notable for the following components:    Eosinophils Absolute 0.8 (*)    All other components within normal limits  TROPONIN I    EKG  EKG Interpretation  Date/Time:  Sunday June 08 2017 10:44:49 EST Ventricular Rate:  73 PR Interval:    QRS Duration:  177 QT Interval:  572 QTC Calculation: 543 R Axis:   -101 Text Interpretation:  Atrial fibrillation Ventricular bigeminy RBBB and LAFB Borderline ST depression, lateral leads Baseline wander in lead(s) V2 Confirmed by Milton Ferguson [phone]) on 06/08/2017 11:02:27 AM       Radiology Dg Chest 2 View  Result Date: 06/08/2017 CLINICAL DATA:  Dyspnea EXAM: CHEST  2 VIEW COMPARISON:  06/01/2017 chest radiograph. FINDINGS: Stable cardiomediastinal silhouette with borderline mild cardiomegaly. No pneumothorax. Trace bilateral pleural effusions. Borderline mild pulmonary edema. Stable calcified left midlung granuloma. IMPRESSION: 1. Borderline mild congestive heart failure. 2. Trace bilateral pleural effusions. Electronically Signed   By: Ilona Sorrel M.D.   On: 06/08/2017 11:55    Procedures Procedures (including critical care time)  Medications Ordered in ED Medications - No data to display   Initial Impression / Assessment and Plan / ED Course  I have reviewed the triage vital signs and the nursing notes.  Pertinent labs & imaging results that were available during my care of the patient were reviewed by me and considered in my medical decision making (see chart for details).     Patient with COPD and congestive heart failure.  Labs show mild anemia mild hyponatremia.  Chest x-ray shows possible mild congestive heart failure.  Patient will be discharged home on his present medications.  He will follow-up with cardiologist next week to see if any other changes could help his fatigue  Final Clinical Impressions(s) / ED Diagnoses   Final diagnoses:  Shortness of breath  COPD mixed type [facility])    ED Discharge Orders    None       Milton Ferguson, MD 06/08/17  1424

## 2017-06-08 NOTE — ED Notes (Signed)
4th visit to ED for "weakness" has seen his cardiologist, pulmonologist, and PCP  Has no home care assistance  Family and pt desire admission-   Last visit to Ed, pt was DC'd and family member called to berate staff and demand pt admission

## 2017-06-08 NOTE — Discharge Instructions (Signed)
Try to eat and drink plenty of fluids.  Call your cardiologist Monday and get set up for an appointment in the next week

## 2017-06-18 ENCOUNTER — Inpatient Hospital Stay (HOSPITAL_COMMUNITY)
Admission: EM | Admit: 2017-06-18 | Discharge: 2017-06-23 | DRG: 812 | Disposition: A | Discharge status: Skilled Nursing Facility | Payer: Medicare HMO | Attending: Internal Medicine | Admitting: Internal Medicine

## 2017-06-18 ENCOUNTER — Encounter (HOSPITAL_COMMUNITY): Payer: Self-pay

## 2017-06-18 ENCOUNTER — Emergency Department (HOSPITAL_COMMUNITY): Payer: Medicare HMO

## 2017-06-18 DIAGNOSIS — K5750 Diverticulosis of both small and large intestine without perforation or abscess without bleeding: Secondary | ICD-10-CM | POA: Diagnosis present

## 2017-06-18 DIAGNOSIS — N183 Chronic kidney disease, stage 3 unspecified: Secondary | ICD-10-CM | POA: Diagnosis present

## 2017-06-18 DIAGNOSIS — I130 Hypertensive heart and chronic kidney disease with heart failure and stage 1 through stage 4 chronic kidney disease, or unspecified chronic kidney disease: Secondary | ICD-10-CM | POA: Diagnosis present

## 2017-06-18 DIAGNOSIS — Z8249 Family history of ischemic heart disease and other diseases of the circulatory system: Secondary | ICD-10-CM

## 2017-06-18 DIAGNOSIS — Z79890 Hormone replacement therapy (postmenopausal): Secondary | ICD-10-CM | POA: Diagnosis not present

## 2017-06-18 DIAGNOSIS — I2510 Atherosclerotic heart disease of native coronary artery without angina pectoris: Secondary | ICD-10-CM | POA: Diagnosis present

## 2017-06-18 DIAGNOSIS — R296 Repeated falls: Secondary | ICD-10-CM | POA: Diagnosis present

## 2017-06-18 DIAGNOSIS — Z7901 Long term (current) use of anticoagulants: Secondary | ICD-10-CM

## 2017-06-18 DIAGNOSIS — E1122 Type 2 diabetes mellitus with diabetic chronic kidney disease: Secondary | ICD-10-CM | POA: Diagnosis present

## 2017-06-18 DIAGNOSIS — S0990XA Unspecified injury of head, initial encounter: Secondary | ICD-10-CM | POA: Diagnosis not present

## 2017-06-18 DIAGNOSIS — I482 Chronic atrial fibrillation: Secondary | ICD-10-CM | POA: Diagnosis present

## 2017-06-18 DIAGNOSIS — E1121 Type 2 diabetes mellitus with diabetic nephropathy: Secondary | ICD-10-CM | POA: Diagnosis present

## 2017-06-18 DIAGNOSIS — E860 Dehydration: Secondary | ICD-10-CM

## 2017-06-18 DIAGNOSIS — I959 Hypotension, unspecified: Secondary | ICD-10-CM | POA: Diagnosis not present

## 2017-06-18 DIAGNOSIS — I480 Paroxysmal atrial fibrillation: Secondary | ICD-10-CM | POA: Diagnosis present

## 2017-06-18 DIAGNOSIS — E785 Hyperlipidemia, unspecified: Secondary | ICD-10-CM | POA: Diagnosis present

## 2017-06-18 DIAGNOSIS — D125 Benign neoplasm of sigmoid colon: Secondary | ICD-10-CM | POA: Diagnosis present

## 2017-06-18 DIAGNOSIS — D62 Acute posthemorrhagic anemia: Secondary | ICD-10-CM | POA: Diagnosis not present

## 2017-06-18 DIAGNOSIS — K5730 Diverticulosis of large intestine without perforation or abscess without bleeding: Secondary | ICD-10-CM | POA: Diagnosis not present

## 2017-06-18 DIAGNOSIS — G4733 Obstructive sleep apnea (adult) (pediatric): Secondary | ICD-10-CM | POA: Diagnosis not present

## 2017-06-18 DIAGNOSIS — R531 Weakness: Secondary | ICD-10-CM

## 2017-06-18 DIAGNOSIS — D509 Iron deficiency anemia, unspecified: Secondary | ICD-10-CM | POA: Diagnosis not present

## 2017-06-18 DIAGNOSIS — K449 Diaphragmatic hernia without obstruction or gangrene: Secondary | ICD-10-CM | POA: Diagnosis present

## 2017-06-18 DIAGNOSIS — D649 Anemia, unspecified: Secondary | ICD-10-CM | POA: Diagnosis present

## 2017-06-18 DIAGNOSIS — K5710 Diverticulosis of small intestine without perforation or abscess without bleeding: Secondary | ICD-10-CM | POA: Diagnosis not present

## 2017-06-18 DIAGNOSIS — K3189 Other diseases of stomach and duodenum: Secondary | ICD-10-CM | POA: Diagnosis not present

## 2017-06-18 DIAGNOSIS — K921 Melena: Secondary | ICD-10-CM | POA: Diagnosis not present

## 2017-06-18 DIAGNOSIS — Z9049 Acquired absence of other specified parts of digestive tract: Secondary | ICD-10-CM

## 2017-06-18 DIAGNOSIS — I10 Essential (primary) hypertension: Secondary | ICD-10-CM | POA: Diagnosis not present

## 2017-06-18 DIAGNOSIS — K319 Disease of stomach and duodenum, unspecified: Secondary | ICD-10-CM | POA: Diagnosis present

## 2017-06-18 DIAGNOSIS — Z87891 Personal history of nicotine dependence: Secondary | ICD-10-CM

## 2017-06-18 DIAGNOSIS — R630 Anorexia: Secondary | ICD-10-CM | POA: Diagnosis not present

## 2017-06-18 DIAGNOSIS — E038 Other specified hypothyroidism: Secondary | ICD-10-CM | POA: Diagnosis not present

## 2017-06-18 DIAGNOSIS — I5032 Chronic diastolic (congestive) heart failure: Secondary | ICD-10-CM | POA: Diagnosis present

## 2017-06-18 DIAGNOSIS — D123 Benign neoplasm of transverse colon: Secondary | ICD-10-CM | POA: Diagnosis present

## 2017-06-18 DIAGNOSIS — R0789 Other chest pain: Secondary | ICD-10-CM | POA: Diagnosis not present

## 2017-06-18 DIAGNOSIS — R2681 Unsteadiness on feet: Secondary | ICD-10-CM | POA: Diagnosis not present

## 2017-06-18 DIAGNOSIS — Z7982 Long term (current) use of aspirin: Secondary | ICD-10-CM

## 2017-06-18 DIAGNOSIS — R791 Abnormal coagulation profile: Secondary | ICD-10-CM

## 2017-06-18 DIAGNOSIS — E118 Type 2 diabetes mellitus with unspecified complications: Secondary | ICD-10-CM | POA: Diagnosis not present

## 2017-06-18 DIAGNOSIS — J439 Emphysema, unspecified: Secondary | ICD-10-CM | POA: Diagnosis not present

## 2017-06-18 DIAGNOSIS — E861 Hypovolemia: Secondary | ICD-10-CM | POA: Diagnosis present

## 2017-06-18 DIAGNOSIS — M25552 Pain in left hip: Secondary | ICD-10-CM | POA: Diagnosis not present

## 2017-06-18 DIAGNOSIS — I951 Orthostatic hypotension: Secondary | ICD-10-CM | POA: Diagnosis present

## 2017-06-18 DIAGNOSIS — E039 Hypothyroidism, unspecified: Secondary | ICD-10-CM | POA: Diagnosis present

## 2017-06-18 DIAGNOSIS — R634 Abnormal weight loss: Secondary | ICD-10-CM | POA: Diagnosis not present

## 2017-06-18 DIAGNOSIS — R0600 Dyspnea, unspecified: Secondary | ICD-10-CM | POA: Diagnosis not present

## 2017-06-18 DIAGNOSIS — M6281 Muscle weakness (generalized): Secondary | ICD-10-CM | POA: Diagnosis not present

## 2017-06-18 DIAGNOSIS — I4891 Unspecified atrial fibrillation: Secondary | ICD-10-CM | POA: Diagnosis not present

## 2017-06-18 DIAGNOSIS — Z9181 History of falling: Secondary | ICD-10-CM | POA: Diagnosis not present

## 2017-06-18 DIAGNOSIS — I429 Cardiomyopathy, unspecified: Secondary | ICD-10-CM | POA: Diagnosis not present

## 2017-06-18 LAB — CBC WITH DIFFERENTIAL/PLATELET
BASOS PCT: 1 %
Basophils Absolute: 0 10*3/uL (ref 0.0–0.1)
Eosinophils Absolute: 0.8 10*3/uL — ABNORMAL HIGH (ref 0.0–0.7)
Eosinophils Relative: 15 %
HEMATOCRIT: 21.6 % — AB (ref 39.0–52.0)
HEMOGLOBIN: 7 g/dL — AB (ref 13.0–17.0)
LYMPHS PCT: 20 %
Lymphs Abs: 1.1 10*3/uL (ref 0.7–4.0)
MCH: 29.3 pg (ref 26.0–34.0)
MCHC: 32.4 g/dL (ref 30.0–36.0)
MCV: 90.4 fL (ref 78.0–100.0)
MONOS PCT: 14 %
Monocytes Absolute: 0.7 10*3/uL (ref 0.1–1.0)
NEUTROS ABS: 2.7 10*3/uL (ref 1.7–7.7)
NEUTROS PCT: 50 %
Platelets: 201 10*3/uL (ref 150–400)
RBC: 2.39 MIL/uL — ABNORMAL LOW (ref 4.22–5.81)
RDW: 18.5 % — ABNORMAL HIGH (ref 11.5–15.5)
WBC: 5.2 10*3/uL (ref 4.0–10.5)

## 2017-06-18 LAB — HEMOGLOBIN A1C
Hgb A1c MFr Bld: 4.7 % — ABNORMAL LOW (ref 4.8–5.6)
Mean Plasma Glucose: 88.19 mg/dL

## 2017-06-18 LAB — LIPASE, BLOOD: LIPASE: 35 U/L (ref 11–51)

## 2017-06-18 LAB — COMPREHENSIVE METABOLIC PANEL
ALK PHOS: 82 U/L (ref 38–126)
ALT: 32 U/L (ref 17–63)
ANION GAP: 11 (ref 5–15)
AST: 56 U/L — AB (ref 15–41)
Albumin: 3.1 g/dL — ABNORMAL LOW (ref 3.5–5.0)
BILIRUBIN TOTAL: 0.8 mg/dL (ref 0.3–1.2)
BUN: 36 mg/dL — AB (ref 6–20)
CALCIUM: 8.3 mg/dL — AB (ref 8.9–10.3)
CO2: 22 mmol/L (ref 22–32)
Chloride: 99 mmol/L — ABNORMAL LOW (ref 101–111)
Creatinine, Ser: 1.86 mg/dL — ABNORMAL HIGH (ref 0.61–1.24)
GFR calc Af Amer: 37 mL/min — ABNORMAL LOW (ref >60)
GFR, EST NON AFRICAN AMERICAN: 32 mL/min — AB (ref >60)
GLUCOSE: 108 mg/dL — AB (ref 65–99)
Potassium: 4 mmol/L (ref 3.5–5.1)
Sodium: 132 mmol/L — ABNORMAL LOW (ref 135–145)
TOTAL PROTEIN: 5.6 g/dL — AB (ref 6.5–8.1)

## 2017-06-18 LAB — PROTIME-INR
INR: 4.43 — AB
Prothrombin Time: 41.9 seconds — ABNORMAL HIGH (ref 11.4–15.2)

## 2017-06-18 LAB — GLUCOSE, CAPILLARY: Glucose-Capillary: 107 mg/dL — ABNORMAL HIGH (ref 65–99)

## 2017-06-18 LAB — VITAMIN B12: Vitamin B-12: 533 pg/mL (ref 180–914)

## 2017-06-18 LAB — TROPONIN I

## 2017-06-18 LAB — IRON AND TIBC
IRON: 74 ug/dL (ref 45–182)
Saturation Ratios: 24 % (ref 17.9–39.5)
TIBC: 309 ug/dL (ref 250–450)
UIBC: 235 ug/dL

## 2017-06-18 LAB — LACTIC ACID, PLASMA: Lactic Acid, Venous: 1.2 mmol/L (ref 0.5–1.9)

## 2017-06-18 LAB — PREPARE RBC (CROSSMATCH)

## 2017-06-18 LAB — ABO/RH: ABO/RH(D): O POS

## 2017-06-18 LAB — FERRITIN: Ferritin: 70 ng/mL (ref 24–336)

## 2017-06-18 MED ORDER — FERROUS SULFATE 325 (65 FE) MG PO TABS
325.0000 mg | ORAL_TABLET | Freq: Every day | ORAL | Status: DC
  Administered 2017-06-19 – 2017-06-23 (×5): 325 mg via ORAL
  Filled 2017-06-18 (×5): qty 1

## 2017-06-18 MED ORDER — ACETAMINOPHEN 325 MG PO TABS
650.0000 mg | ORAL_TABLET | Freq: Four times a day (QID) | ORAL | Status: DC | PRN
  Administered 2017-06-19 – 2017-06-22 (×3): 650 mg via ORAL
  Filled 2017-06-18 (×3): qty 2

## 2017-06-18 MED ORDER — INSULIN ASPART 100 UNIT/ML ~~LOC~~ SOLN
3.0000 [IU] | Freq: Three times a day (TID) | SUBCUTANEOUS | Status: DC
  Administered 2017-06-22: 3 [IU] via SUBCUTANEOUS

## 2017-06-18 MED ORDER — DIPHENHYDRAMINE HCL 12.5 MG/5ML PO ELIX
50.0000 mg | ORAL_SOLUTION | Freq: Every evening | ORAL | Status: DC | PRN
  Administered 2017-06-18 – 2017-06-19 (×2): 50 mg via ORAL
  Administered 2017-06-23: 12.5 mg via ORAL
  Filled 2017-06-18 (×6): qty 20

## 2017-06-18 MED ORDER — AMIODARONE HCL 200 MG PO TABS
200.0000 mg | ORAL_TABLET | Freq: Every day | ORAL | Status: DC
  Administered 2017-06-19 – 2017-06-23 (×5): 200 mg via ORAL
  Filled 2017-06-18 (×5): qty 1

## 2017-06-18 MED ORDER — INSULIN ASPART 100 UNIT/ML ~~LOC~~ SOLN: 0.0000 [IU] | Freq: Three times a day (TID) | SUBCUTANEOUS | Status: DC

## 2017-06-18 MED ORDER — DEXTROSE 50 % IV SOLN
INTRAVENOUS | Status: AC
  Filled 2017-06-18: qty 50

## 2017-06-18 MED ORDER — INSULIN ASPART 100 UNIT/ML ~~LOC~~ SOLN: 0.0000 [IU] | Freq: Every day | SUBCUTANEOUS | Status: DC

## 2017-06-18 MED ORDER — SODIUM CHLORIDE 0.9 % IV BOLUS (SEPSIS)
500.0000 mL | Freq: Once | INTRAVENOUS | Status: AC
  Administered 2017-06-18: 1000 mL via INTRAVENOUS

## 2017-06-18 MED ORDER — SODIUM CHLORIDE 0.9 % IV SOLN
Freq: Once | INTRAVENOUS | Status: AC
  Administered 2017-06-21: 09:00:00 via INTRAVENOUS

## 2017-06-18 MED ORDER — ACETAMINOPHEN 650 MG RE SUPP: 650.0000 mg | Freq: Four times a day (QID) | RECTAL | Status: DC | PRN

## 2017-06-18 MED ORDER — ONDANSETRON HCL 4 MG/2ML IJ SOLN
4.0000 mg | Freq: Four times a day (QID) | INTRAMUSCULAR | Status: DC | PRN
  Administered 2017-06-19 – 2017-06-21 (×2): 4 mg via INTRAVENOUS
  Filled 2017-06-18 (×2): qty 2

## 2017-06-18 MED ORDER — SODIUM CHLORIDE 0.9 % IV SOLN
INTRAVENOUS | Status: DC
  Administered 2017-06-18 (×2): via INTRAVENOUS

## 2017-06-18 MED ORDER — SODIUM CHLORIDE 0.9 % IV SOLN
INTRAVENOUS | Status: AC
  Administered 2017-06-18 (×2): via INTRAVENOUS

## 2017-06-18 MED ORDER — LEVOTHYROXINE SODIUM 50 MCG PO TABS
50.0000 ug | ORAL_TABLET | ORAL | Status: DC
  Administered 2017-06-19 – 2017-06-23 (×5): 50 ug via ORAL
  Filled 2017-06-18 (×5): qty 1

## 2017-06-18 MED ORDER — SENNOSIDES-DOCUSATE SODIUM 8.6-50 MG PO TABS
1.0000 | ORAL_TABLET | Freq: Every evening | ORAL | Status: DC | PRN
  Filled 2017-06-18: qty 1

## 2017-06-18 MED ORDER — ALBUTEROL SULFATE HFA 108 (90 BASE) MCG/ACT IN AERS: 1.0000 | INHALATION_SPRAY | Freq: Four times a day (QID) | RESPIRATORY_TRACT | Status: AC | PRN

## 2017-06-18 MED ORDER — B COMPLEX-C PO TABS
1.0000 | ORAL_TABLET | Freq: Every day | ORAL | Status: DC
  Administered 2017-06-19 – 2017-06-23 (×5): 1 via ORAL
  Filled 2017-06-18 (×5): qty 1

## 2017-06-18 MED ORDER — SODIUM CHLORIDE 0.9 % IV SOLN
Freq: Once | INTRAVENOUS | Status: AC
  Administered 2017-06-18: via INTRAVENOUS

## 2017-06-18 MED ORDER — ONDANSETRON HCL 4 MG PO TABS: 4.0000 mg | ORAL_TABLET | Freq: Four times a day (QID) | ORAL | Status: DC | PRN

## 2017-06-18 NOTE — ED Triage Notes (Addendum)
Pt brought in from PCP by EMS due to hypotension. Pt was to be seen due dizziness and falls for weeks. Pt seen here and placed on iron pills. BP in PCP office initially 68/40 . EMS reports   90/62. Denies pain. Pt reports dizziness when he ambulates and then falls. Pt reports not eating well and progressive weakness

## 2017-06-18 NOTE — ED Provider Notes (Signed)
[facility] Provider Note   CSN: [id number] Arrival date & time: 06/18/17  1008     History   Chief Complaint Chief Complaint  Patient presents with  . Hypotension    HPI Brett [name] is a 84 y.o. male.     Pt was seen at 1025. Per EMS and pt report, c/o gradual onset and worsening of persistent generalized weakness for the past 3 weeks. Pt states he has been frequently falling due to feeling lightheaded. EMS states pt's BP in PMD's office today as "68/40" and for them was "90/62." Pt also endorses poor PO intake. Denies CP/palpitations, no SOB/cough, no abd pain, no N/V/D, no back or neck pain, no focal motor weakness, no tingling/numbness in extremities, no syncope, no AMS.   Past Medical History:  Diagnosis Date  . Atrial fibrillation ([place])    a. s/p DCCV in 12/2016, remains on Coumadin and Amiodarone  . CAD (coronary artery disease)    a. s/p POBA of D1 in 2009 with residual 20-30% stenosis along RCA and LAD  . Chronic kidney disease (CKD), stage III (moderate) (HCC)   . Dysrhythmia    AFib  . Essential hypertension, benign   . History of cardiac catheterization    Details not certain - reportedly no interventions  . Hypothyroidism   . Macular degeneration   . Pulmonary hypertension ([place]) 12/03/2013   PA pressure 56. Ejection fraction 55-60%  . Type 2 diabetes mellitus [facility])     Patient Active Problem List   Diagnosis Date Noted  . CHF exacerbation ([place]) 12/20/2016  . Acute congestive heart failure ([place])   . Acute on chronic diastolic CHF (congestive heart failure) ([place]) 10/24/2016  . Jaundice   . Periumbilical pain   . Elevated lipase   . Coagulopathy ([place]) 10/16/2016  . Elevated liver enzymes   . Hyperbilirubinemia   . Hyponatremia   . Abdominal pain 10/15/2016  . A-fib ([place]) 02/23/2014  . Diabetes ([place]) 02/23/2014  . BP (high blood pressure) 02/23/2014  . HLD (hyperlipidemia) 02/23/2014  . Excess weight 02/23/2014  . Chronic  diastolic heart failure ([place]) 12/24/2013  . Acute bronchitis 12/06/2013  . DIC (disseminated intravascular coagulation) ([place]) 12/04/2013  . Thrombocytopenia, unspecified ([place]) 12/03/2013  . Pulmonary hypertension ([place]) 12/03/2013  . Wheezing 12/02/2013  . Obesity, morbid ([place]) 12/02/2013  . Transaminitis 12/02/2013  . Type 2 diabetes mellitus with diabetic nephropathy ([place]) 12/01/2013  . Hypertension 12/01/2013  . Atrial fibrillation with RVR ([place]) 12/01/2013  . Gall stones, common bile duct 12/01/2013  . Pancreatitis due to biliary obstruction 12/01/2013  . Chronic kidney disease (CKD), stage III (moderate) ([place]) 12/01/2013  . Hypothyroidism 12/01/2013  . Cholelithiasis 12/01/2013  . History of other specified conditions presenting hazards to health 02/09/2008  . Lumbar back sprain 02/09/2008  . Herpes zoster keratoconjunctivitis 09/15/2007  . Acute systolic heart failure ([place]) 08/20/2007  . PNA (pneumonia) 08/20/2007  . Fast heart beat 08/20/2007  . Feeling bilious 08/16/2007    Past Surgical History:  Procedure Laterality Date  . CARDIOVERSION N/A 12/10/2013   Procedure: CARDIOVERSION;  Surgeon: Herminio Commons, MD;  Location: [facility];  Service: Endoscopy;  Laterality: N/A;  . CARDIOVERSION N/A 11/15/2016   Procedure: CARDIOVERSION;  Surgeon: Arnoldo Lenis, MD;  Location: [facility];  Service: Endoscopy;  Laterality: N/A;  . CARDIOVERSION N/A 12/18/2016   Procedure: CARDIOVERSION;  Surgeon: Arnoldo Lenis, MD;  Location: [facility];  Service: Endoscopy;  Laterality: N/A;  pt knows  to arrive at 8:00  . CATARACT EXTRACTION W/PHACO Left 11/13/2015   Procedure: CATARACT EXTRACTION PHACO AND INTRAOCULAR LENS PLACEMENT LEFT EYE CDE=7.76;  Surgeon: Williams Che, MD;  Location: [facility];  Service: Ophthalmology;  Laterality: Left;  . CHOLECYSTECTOMY    . COLONOSCOPY     Remote > 10 years ago  . ERCP N/A 10/17/2016   Dr. Gala Romney: sphincterotomy and stone extraction   . REMOVAL OF  STONES  10/17/2016   Procedure: REMOVAL OF STONES with balloon;  Surgeon: Daneil Dolin, MD;  Location: [facility];  Service: Endoscopy;;  . REPLACEMENT TOTAL HIP W/  RESURFACING IMPLANTS Bilateral   . SPHINCTEROTOMY  10/17/2016   Procedure: BILLARY SPHINCTEROTOMY with ballon dilation;  Surgeon: Daneil Dolin, MD;  Location: [facility];  Service: Endoscopy;;  . TEE WITHOUT CARDIOVERSION N/A 12/10/2013   Procedure: TRANSESOPHAGEAL ECHOCARDIOGRAM (TEE);  Surgeon: Herminio Commons, MD;  Location: [facility];  Service: Endoscopy;  Laterality: N/A;       Home Medications    Prior to Admission medications   Medication Sig Start Date End Date Taking? Authorizing Provider  albuterol (PROVENTIL HFA;VENTOLIN HFA) 108 (90 Base) MCG/ACT inhaler Inhale 1-2 puffs into the lungs every 6 (six) hours as needed for wheezing or shortness of breath.    [provider]  amiodarone (PACERONE) 200 MG tablet Take 1 tablet (200 mg total) by mouth daily. 04/22/17   Strader, Fransisco Hertz, PA-C  aspirin EC 81 MG EC tablet Take 1 tablet (81 mg total) by mouth daily. 12/23/16   Isaac Bliss, Rayford Halsted, MD  b complex vitamins tablet Take 1 tablet by mouth daily.    [provider]  diphenhydrAMINE (BENADRYL) 12.5 MG/5ML elixir Take 50 mg by mouth at bedtime as needed for allergies or sleep.    [provider]  doxazosin (CARDURA) 2 MG tablet Take 1 tablet (2 mg total) by mouth at bedtime. 04/24/15   Herminio Commons, MD  ferrous sulfate 325 (65 FE) MG tablet Take 1 tablet (325 mg total) by mouth daily. 04/14/17   Nat Christen, MD  furosemide (LASIX) 40 MG tablet Take 1 tablet (40 mg total) by mouth daily. 12/22/16 06/08/17  Isaac Bliss, Rayford Halsted, MD  levothyroxine (SYNTHROID, LEVOTHROID) 50 MCG tablet Take 50 mcg by mouth daily before breakfast.     [provider]  MELATONIN PO Take 4 mg by mouth at bedtime. 2 mg tablets    [provider]  metFORMIN (GLUCOPHAGE) 500 MG  tablet Take 1 tablet (500 mg total) by mouth 2 (two) times daily with a meal. 11/16/16   Orvan Falconer, MD  tiotropium (SPIRIVA) 18 MCG inhalation capsule Place 18 mcg into inhaler and inhale daily.    [provider]  warfarin (COUMADIN) 3 MG tablet Take 1 tablet (3 mg total) by mouth daily at 6 PM. Patient taking differently: Take 3 mg by mouth daily at 6 PM. 1/2 tablet on Monday, Thursday 10/20/16   Orson Eva, MD    Family History Family History  Problem Relation Age of Onset  . Hypertension Mother   . Hypertension Father   . Colon cancer Neg Hx     Social History Social History   Tobacco Use  . Smoking status: Former Smoker    Packs/day: 1.00    Years: 37.00    Pack years: 37.00    Types: Cigarettes    Start date: 11/01/1952    Last attempt to quit: 08/18/1978  Years since quitting: 38.8  . Smokeless tobacco: Never Used  Substance Use Topics  . Alcohol use: Yes    Alcohol/week: 0.0 oz    Comment: Occasional  . Drug use: No     Allergies   Patient has no known allergies.   Review of Systems Review of Systems ROS: Statement: All systems negative except as marked or noted in the HPI; Constitutional: Negative for fever and chills. +generalized weakness, frequent falls.  ; ; Eyes: Negative for eye pain, redness and discharge. ; ; ENMT: Negative for ear pain, hoarseness, nasal congestion, sinus pressure and sore throat. ; ; Cardiovascular: Negative for chest pain, palpitations, diaphoresis, dyspnea and peripheral edema. ; ; Respiratory: Negative for cough, wheezing and stridor. ; ; Gastrointestinal: +poor PO intake. Negative for nausea, vomiting, diarrhea, abdominal pain, blood in stool, hematemesis, jaundice and rectal bleeding. . ; ; Genitourinary: Negative for dysuria, flank pain and hematuria. ; ; Musculoskeletal: Negative for back pain and neck pain. Negative for swelling and trauma.; ; Skin: Negative for pruritus, rash, abrasions, blisters, bruising and skin lesion.; ;  Neuro: +lightheadedness. Negative for headache and neck stiffness. Negative for altered level of consciousness, altered mental status, extremity weakness, paresthesias, involuntary movement, seizure and syncope.       Physical Exam Updated Vital Signs BP (!) 107/47 (BP Location: Left Arm)   Pulse 95   Temp (!) 97.5 F (36.4 C) (Oral)   Resp 17   Wt 88.5 kg (195 lb)   SpO2 100%   BMI 26.45 kg/m    10:39 Orthostatic Vital Signs HC  Orthostatic Lying   BP- Lying: 92/62  Pulse- Lying: 87      Orthostatic Sitting  BP- Sitting:  88/53  Pulse- Sitting: 98      Orthostatic Standing at 0 minutes  BP- Standing at 0 minutes:  78/46  Pulse- Standing at 0 minutes: 101     Patient Vitals for the past 24 hrs:  BP Temp Temp src Pulse Resp SpO2 Weight  06/18/17 1200 121/74 - - 88 15 100 % -  06/18/17 1130 103/65 - - 91 19 99 % -  06/18/17 1128 (!) 98/54 - - 94 (!) 25 100 % -  06/18/17 1100 (!) 98/54 - - 88 12 99 % -  06/18/17 1030 92/62 - - 89 13 100 % -  06/18/17 1016 (!) 107/47 (!) 97.5 F (36.4 C) Oral 95 17 100 % -  06/18/17 1012 - - - - - - 88.5 kg (195 lb)      Physical Exam 1030: Physical examination:  Nursing notes reviewed; Vital signs and O2 SAT reviewed;  Constitutional: Well developed, Well nourished, In no acute distress; Head:  Normocephalic, atraumatic; Eyes: EOMI, PERRL, No scleral icterus; ENMT: Mouth and pharynx normal, Mucous membranes dry; Neck: Supple, Full range of motion, No lymphadenopathy; Cardiovascular: Irregular rate and rhythm, No gallop; Respiratory: Breath sounds clear & equal bilaterally, No wheezes.  Speaking full sentences with ease, Normal respiratory effort/excursion; Chest: Nontender, Movement normal; Abdomen: Soft, Nontender, Nondistended, Normal bowel sounds; Genitourinary: No CVA tenderness; Spine:  No midline CS, TS, LS tenderness.;; Extremities: Pulses normal, No tenderness, No edema, No calf edema or asymmetry.; Neuro: AA&Ox3, Major CN  grossly intact. No facial droop. Speech clear. No gross focal motor or sensory deficits in extremities.; Skin: Color pale, Warm, Dry.   ED Treatments / Results  Labs (all labs ordered are listed, but only abnormal results are displayed)   EKG  EKG Interpretation  Date/Time:  Wednesday June 18 2017 10:19:28 EST Ventricular Rate:  88 PR Interval:    QRS Duration: 150 QT Interval:  451 QTC Calculation: 546 R Axis:   -38 Text Interpretation:  Atrial fibrillation Ventricular bigeminy IVCD, consider atypical RBBB When compared with ECG of 06/08/2017 No significant change was found Confirmed by Francine Graven [phone]) on 06/18/2017 10:41:07 AM       Radiology   Procedures Procedures (including critical care time)  Medications Ordered in ED Medications  0.9 %  sodium chloride infusion ( Intravenous New Bag/Given 06/18/17 1314)  sodium chloride 0.9 % bolus 500 mL (0 mLs Intravenous Stopped 06/18/17 1145)     Initial Impression / Assessment and Plan / ED Course  I have reviewed the triage vital signs and the nursing notes.  Pertinent labs & imaging results that were available during my care of the patient were reviewed by me and considered in my medical decision making (see chart for details).  MDM Reviewed: previous chart, nursing note and vitals Reviewed previous: labs and ECG Interpretation: labs, ECG, x-ray and CT scan Total time providing critical care: 30-74 minutes. This excludes time spent performing separately reportable procedures and services. Consults: admitting MD   CRITICAL CARE Performed by: Alfonzo Feller Total critical care time: 35 minutes Critical care time was exclusive of separately billable procedures and treating other patients. Critical care was necessary to treat or prevent imminent or life-threatening deterioration. Critical care was time spent personally by me on the following activities: development of treatment plan with patient and/or  surrogate as well as nursing, discussions with consultants, evaluation of patient's response to treatment, examination of patient, obtaining history from patient or surrogate, ordering and performing treatments and interventions, ordering and review of laboratory studies, ordering and review of radiographic studies, pulse oximetry and re-evaluation of patient's condition.   Results for orders placed or performed during the hospital encounter of 06/18/17  Comprehensive metabolic panel  Result Value Ref Range   Sodium 132 (L) 135 - 145 mmol/L   Potassium 4.0 3.5 - 5.1 mmol/L   Chloride 99 (L) 101 - 111 mmol/L   CO2 22 22 - 32 mmol/L   Glucose, Bld 108 (H) 65 - 99 mg/dL   BUN 36 (H) 6 - 20 mg/dL   Creatinine, Ser 1.86 (H) 0.61 - 1.24 mg/dL   Calcium 8.3 (L) 8.9 - 10.3 mg/dL   Total Protein 5.6 (L) 6.5 - 8.1 g/dL   Albumin 3.1 (L) 3.5 - 5.0 g/dL   AST 56 (H) 15 - 41 U/L   ALT 32 17 - 63 U/L   Alkaline Phosphatase 82 38 - 126 U/L   Total Bilirubin 0.8 0.3 - 1.2 mg/dL   GFR calc non Af Amer 32 (L) >60 mL/min   GFR calc Af Amer 37 (L) >60 mL/min   Anion gap 11 5 - 15  Lipase, blood  Result Value Ref Range   Lipase 35 11 - 51 U/L  Troponin I  Result Value Ref Range   Troponin I <0.03 <0.03 ng/mL  Lactic acid, plasma  Result Value Ref Range   Lactic Acid, Venous 1.2 0.5 - 1.9 mmol/L  CBC with Differential  Result Value Ref Range   WBC 5.2 4.0 - 10.5 K/uL   RBC 2.39 (L) 4.22 - 5.81 MIL/uL   Hemoglobin 7.0 (L) 13.0 - 17.0 g/dL   HCT 21.6 (L) 39.0 - 52.0 %   MCV 90.4 78.0 - 100.0 fL   MCH 29.3 26.0 - 34.0 pg  MCHC 32.4 30.0 - 36.0 g/dL   RDW 18.5 (H) 11.5 - 15.5 %   Platelets 201 150 - 400 K/uL   Neutrophils Relative % 50 %   Neutro Abs 2.7 1.7 - 7.7 K/uL   Lymphocytes Relative 20 %   Lymphs Abs 1.1 0.7 - 4.0 K/uL   Monocytes Relative 14 %   Monocytes Absolute 0.7 0.1 - 1.0 K/uL   Eosinophils Relative 15 %   Eosinophils Absolute 0.8 (H) 0.0 - 0.7 K/uL   Basophils Relative 1 %     Basophils Absolute 0.0 0.0 - 0.1 K/uL  Protime-INR  Result Value Ref Range   Prothrombin Time 41.9 (H) 11.4 - 15.2 seconds   INR 4.43 (HH)     Ct Head Wo Contrast Result Date: 06/18/2017 CLINICAL DATA:  Head trauma.  Hypotension. Initial encounter. EXAM: CT HEAD WITHOUT CONTRAST TECHNIQUE: Contiguous axial images were obtained from the base of the skull through the vertex without intravenous contrast. COMPARISON:  None. FINDINGS: Brain: No evidence of acute infarction, hemorrhage, hydrocephalus, extra-axial collection or mass lesion/mass effect. Nonspecific atrophy with ventriculomegaly. Chronic small vessel ischemia in the periventricular white matter. Vascular: Atherosclerotic calcification.  No hyperdense vessel. Skull: Negative Sinuses/Orbits: No acute finding.  Left cataract resection. IMPRESSION: No evidence of intracranial injury. Electronically Signed   By: Monte Fantasia M.D.   On: 06/18/2017 12:09   Dg Chest 2 View Result Date: 06/18/2017 CLINICAL DATA:  Pt brought in from PCP by EMS due to hypotension. Pt was to be seen due dizziness and falls for weeks. Pt seen here and placed on iron pills. BP in PCP office initially 68/40 . EMS reports 90/62. Denies pain. Pt reports dizzine.*comment was truncated* EXAM: CHEST - 2 VIEW COMPARISON:  06/08/2017 FINDINGS: Normal cardiac silhouette. Lungs are mildly hyperinflated. No effusion, infiltrate or pneumothorax. Calcified pulmonary nodules unchanged from prior. IMPRESSION: No acute cardiopulmonary process. Electronically Signed   By: Suzy Bouchard M.D.   On: 06/18/2017 12:48     1120:  [name] 2 family members confronted me in the hallway and began to yell loudly they "wanted him transferred to [facility] right now!" I explained I needed dx testing before I could make the determination for admission/transfer and legally, per federal law, could not "just transfer him." [name] family refuses all dx testing. They continued to yell at me, gesture  largely, make rude comments, and encroach into my personal space. They yelled that I was refusing their request. I politely requested that family to lower their voice; family then accused me of yelling at them and demanded "directors" and "witnesses" to our conversation to "hear you yelling at me" (which was already taking place in the hallway in front of multiple nurses and Security at the nurses station). I again reiterated EMTALA/federal law in layman's terms, and that I needed dx testing before I could transfer him to another hospital. They stated that EMS told them that I would not allow the initial transport out of [facility] to [facility] from his PMD's office and they had his family doctor (stating "she" and "her") was on the phone to corroborate their story.  I explained that I was not the EMS director and did not make their policy: I directed them to Dr. Gerarda Fraction who is the [facility]. Family stated "whatever." Family then stated that "you have labs from last week." I explained since pt had a change in condition, I need new dx testing to r/o conditions such as pneumonia, UTI,  AKI/dehydration, etc. Family then stated that I was "just covering for the doctor's last week" for "giving him lasix in the ER when he was already dehydrated," and stated that "your Director can transfer him to [facility] and then you won't be responsible," "go get your Director."  I explained that legally that could not be done, and reiterated need for dx testing before admission. Then family stated that I told them pt was going to be admitted here to [facility]. I stated that I did not say that, only that I believed, based solely on his BP measurements today (orthostatics) that he would need to be admitted. [name] family told me "this was enough information to get him admitted." I explained that it was not "enough" several more times and why. Pt then accused me of stating to him that he would be admitted here to [facility], I again  reiterated that I did not say that. [name] family then continued to accuse me of yelling at the pt and them; which I was not, with all conversations in full view and hearing of entire nurses' station, Asst ED Nursing Director, and Security. Family then started to shut pt's exam room door and bar my egress. Family continued to yell loudly as I opened the other door fully and walked out of the exam room. The male family member started to speak, but male family member stated very loudly to the pt and male family member "oh, don't worry, I got this" as I walked out of the exam room.  ED Asst Nursing Director then spoke with family. Security was called for staff safety.   1315:  H/H lower than previous; T&S ordered in preparation for transfusion PRBC's. INR elevated; coumadin will need to be held. SBP improved after IVF bolus. I will not perform rectal exam at this time, as pt's family continues agitated, aggressive, splitting staff, saying things that staff said and did that did not actually happen, refusing care from certain ED staff. Security remains in the ED. T/C returned from [facility] Dr. Jerilee Hoh, case discussed, including:  HPI, pertinent PM/SHx, VS/PE, dx testing, ED course and treatment:  Agreeable to come to ED for evaluation for admission.       Final Clinical Impressions(s) / ED Diagnoses   Final diagnoses:  None    ED Discharge Orders    None        Francine Graven, DO 06/19/17 2250

## 2017-06-18 NOTE — ED Notes (Signed)
[name] family yelling at MD and previous RN regarding wanting pt transferred to RN. MD Thurnell Garbe was polite, explained the situation, explained the reasoning for diagnostic testing before being transferred.  Family member started to yell at MD [facility] and began to get into her face.  Another male family member stepped up to [facility] with his hand in her face yelling as well.  Male family member started to yell and make rude remarks to MD and RN.  Security notified and Pt pacing in the room.

## 2017-06-18 NOTE — ED Notes (Signed)
Pt family at bedside. Daughter in law refusing to let staff obtain blood. States she wants him transferred to [facility] and they will take him there if she has too.  Will notify EDP

## 2017-06-18 NOTE — ED Notes (Signed)
Pt walking up the hall, knocking on medical director's door.  Pt told that they are not here today. Pt began to say "He has no rights then so if he needs blood, you get in that room and draw his blood" Pt went back into room.  This RN called lab to have blood drawn

## 2017-06-18 NOTE — ED Notes (Signed)
Patient transported to CT 

## 2017-06-18 NOTE — ED Notes (Signed)
This RN explained to family the wait for being transferred, how the pt will be transferred as well as caring for the patient in our ED until he is transferred.  The family began to state that "this was ridiculous of a wait and you should work at a bigger hospital to see the difference".  RN explained the situation again and apologized for the wait.

## 2017-06-18 NOTE — H&P (Addendum)
History and Physical    HAJIME ASFAW VOJ:[id number] DOB: Jan 08, 1933 DOA: 06/18/2017  Referring MD/NP/PA: Francine Graven, EDP PCP: [name], MD  Patient coming from: Home  Chief Complaint: Weakness, fatigue, falls  HPI: Brett [name] is a 84 y.o. male with history of atrial fibrillation on chronic anticoagulation with Coumadin, chronic kidney disease stage III with a baseline creatinine around 1.8, hypertension, hypothyroidism and type 2 diabetes who comes to the hospital today after EMS was called due to hypotension and weakness.  Reportedly blood pressure was initially systolic of 60 and improved to 90 prior to transport.  Of note patient has had 2 ED visits in February for weakness.  At one point his metoprolol dose was reduced due to a first-degree AV block and his weakness.  Patient tells me that in addition to fatigue he has been having dizziness upon standing and has fallen a few times causing significant skin tears to his right forearm.  Has never lost consciousness, has not hit his head.  He has also noticed dark stools, however has been on an iron supplement.  He was found to be orthostatic in the ED with systolic blood pressure dropping from the 90s-70s and pulse rate increasing from 80s to 100s while going from sitting to standing.,  Labs are significant for hemoglobin of 7.0 with an MCV of 90.4, troponin of less than 0.03, creatinine at baseline of 1.86.  CT scan of the head is negative, chest x-ray without acute cardiopulmonary process.  Admission is requested.  Of note, there have apparently been several difficult encounters with patient's family.  Family is insistent on transfer to [facility], has apparently been aggressive and combative with staff to the point where security needed to be notified.  When I assessed the patient no family was at bedside and patient was calm.  Past Medical/Surgical History: Past Medical History:  Diagnosis Date  . Atrial fibrillation ([place])    a.  s/p DCCV in 12/2016, remains on Coumadin and Amiodarone  . CAD (coronary artery disease)    a. s/p POBA of D1 in 2009 with residual 20-30% stenosis along RCA and LAD  . Chronic kidney disease (CKD), stage III (moderate) (HCC)   . Dysrhythmia    AFib  . Essential hypertension, benign   . History of cardiac catheterization    Details not certain - reportedly no interventions  . Hypothyroidism   . Macular degeneration   . Pulmonary hypertension ([place]) 12/03/2013   PA pressure 56. Ejection fraction 55-60%  . Type 2 diabetes mellitus ([place])     Past Surgical History:  Procedure Laterality Date  . CARDIOVERSION N/A 12/10/2013   Procedure: CARDIOVERSION;  Surgeon: Herminio Commons, MD;  Location: [facility];  Service: Endoscopy;  Laterality: N/A;  . CARDIOVERSION N/A 11/15/2016   Procedure: CARDIOVERSION;  Surgeon: Arnoldo Lenis, MD;  Location: [facility];  Service: Endoscopy;  Laterality: N/A;  . CARDIOVERSION N/A 12/18/2016   Procedure: CARDIOVERSION;  Surgeon: Arnoldo Lenis, MD;  Location: [facility];  Service: Endoscopy;  Laterality: N/A;  pt knows to arrive at 8:00  . CATARACT EXTRACTION W/PHACO Left 11/13/2015   Procedure: CATARACT EXTRACTION PHACO AND INTRAOCULAR LENS PLACEMENT LEFT EYE CDE=7.76;  Surgeon: Williams Che, MD;  Location: [facility];  Service: Ophthalmology;  Laterality: Left;  . CHOLECYSTECTOMY    . COLONOSCOPY     Remote > 10 years ago  . ERCP N/A 10/17/2016   Dr. Gala Romney: sphincterotomy and stone extraction   .  REMOVAL OF STONES  10/17/2016   Procedure: REMOVAL OF STONES with balloon;  Surgeon: Daneil Dolin, MD;  Location: [facility];  Service: Endoscopy;;  . REPLACEMENT TOTAL HIP W/  RESURFACING IMPLANTS Bilateral   . SPHINCTEROTOMY  10/17/2016   Procedure: BILLARY SPHINCTEROTOMY with ballon dilation;  Surgeon: Daneil Dolin, MD;  Location: [facility];  Service: Endoscopy;;  . TEE WITHOUT CARDIOVERSION N/A 12/10/2013   Procedure: TRANSESOPHAGEAL ECHOCARDIOGRAM  (TEE);  Surgeon: Herminio Commons, MD;  Location: [facility];  Service: Endoscopy;  Laterality: N/A;    Social History:  reports that he quit smoking about 38 years ago. His smoking use included cigarettes. He started smoking about 64 years ago. He has a 37.00 pack-year smoking history. he has never used smokeless tobacco. He reports that he drinks alcohol. He reports that he does not use drugs.  Allergies: No Known Allergies  Family History:  Family History  Problem Relation Age of Onset  . Hypertension Mother   . Hypertension Father   . Colon cancer Neg Hx     Prior to Admission medications   Medication Sig Start Date End Date Taking? Authorizing Provider  albuterol (PROVENTIL HFA;VENTOLIN HFA) 108 (90 Base) MCG/ACT inhaler Inhale 1-2 puffs into the lungs every 6 (six) hours as needed for wheezing or shortness of breath.   Yes [provider]  amiodarone (PACERONE) 200 MG tablet Take 1 tablet (200 mg total) by mouth daily. 04/22/17  Yes Strader, Fransisco Hertz, PA-C  aspirin EC 81 MG EC tablet Take 1 tablet (81 mg total) by mouth daily. 12/23/16  Yes Isaac Bliss, Rayford Halsted, MD  b complex vitamins tablet Take 1 tablet by mouth daily.   Yes [provider]  diphenhydrAMINE (BENADRYL) 12.5 MG/5ML elixir Take 50 mg by mouth at bedtime as needed for allergies or sleep.   Yes [provider]  doxazosin (CARDURA) 2 MG tablet Take 1 tablet (2 mg total) by mouth at bedtime. 04/24/15  Yes Herminio Commons, MD  ferrous sulfate 325 (65 FE) MG tablet Take 1 tablet (325 mg total) by mouth daily. 04/14/17  Yes Nat Christen, MD  furosemide (LASIX) 40 MG tablet Take 1 tablet (40 mg total) by mouth daily. 12/22/16 06/18/17 Yes Erline Hau, MD  levothyroxine (SYNTHROID, LEVOTHROID) 50 MCG tablet Take 50 mcg by mouth daily before breakfast.    Yes [provider]  MELATONIN PO Take 4 mg by mouth at bedtime. 2 mg tablets   Yes [provider]  Tiotropium  Bromide-Olodaterol (STIOLTO RESPIMAT) 2.5-2.5 MCG/ACT AERS Inhale 1 puff into the lungs daily.   Yes [provider]  warfarin (COUMADIN) 3 MG tablet Take 1 tablet (3 mg total) by mouth daily at 6 PM. Patient taking differently: Take 3 mg by mouth daily at 6 PM. 1/2 tablet on Monday, Thursday 10/20/16  Yes Tat, Shanon Brow, MD  metFORMIN (GLUCOPHAGE) 500 MG tablet Take 1 tablet (500 mg total) by mouth 2 (two) times daily with a meal. Patient not taking: Reported on 06/18/2017 11/16/16   Orvan Falconer, MD    Review of Systems:  Constitutional: Denies fever, chills, diaphoresis, appetite change and fatigue.  HEENT: Denies photophobia, eye pain, redness, hearing loss, ear pain, congestion, sore throat, rhinorrhea, sneezing, mouth sores, trouble swallowing, neck pain, neck stiffness and tinnitus.   Respiratory: Denies SOB, DOE, cough, chest tightness,  and wheezing.   Cardiovascular: Denies chest pain, palpitations and leg swelling.  Gastrointestinal: Denies nausea, vomiting, abdominal pain,  diarrhea, constipation, blood in stool and abdominal distention.  Genitourinary: Denies dysuria, urgency, frequency, hematuria, flank pain and difficulty urinating.  Endocrine: Denies: hot or cold intolerance, sweats, changes in hair or nails, polyuria, polydipsia. Musculoskeletal: Denies myalgias, back pain, joint swelling, arthralgias and gait problem.  Skin: Denies pallor, rash and wound.  Neurological: Denies dizziness, seizures, syncope, weakness, light-headedness, numbness and headaches.  Hematological: Denies adenopathy. Easy bruising, personal or family bleeding history  Psychiatric/Behavioral: Denies suicidal ideation, mood changes, confusion, nervousness, sleep disturbance and agitation    Physical Exam: Vitals:   06/18/17 1230 06/18/17 1344 06/18/17 1415 06/18/17 1450  BP: 108/63 106/85 (!) 109/59 124/66  Pulse: 91 93 89 90  Resp: (!) 22 15 16 20   Temp:      TempSrc:      SpO2: 97% 100% 99% 98%    Weight:         Constitutional: NAD, calm, comfortable, pale Eyes: PERRL, lids and conjunctivae normal ENMT: Mucous membranes are very dry. Posterior pharynx clear of any exudate or lesions. Poor dentition.  Neck: normal, supple, no masses, no thyromegaly Respiratory: clear to auscultation bilaterally, no wheezing, no crackles. Normal respiratory effort. No accessory muscle use.  Cardiovascular: Regular rate and rhythm, no murmurs / rubs / gallops. No extremity edema. 2+ pedal pulses. No carotid bruits.  Abdomen: no tenderness, no masses palpated. No hepatosplenomegaly. Bowel sounds positive.  Musculoskeletal: no clubbing / cyanosis. No joint deformity upper and lower extremities. Good ROM, no contractures. Normal muscle tone.  Skin: no rashes, lesions, ulcers. No induration Neurologic: CN 2-12 grossly intact. Sensation intact, DTR normal. Strength 5/5 in all 4.  Psychiatric: Normal judgment and insight. Alert and oriented x 3. Normal mood.    Labs on Admission: I have personally reviewed the following labs and imaging studies  CBC: Recent Labs  Lab 06/18/17 1142  WBC 5.2  NEUTROABS 2.7  HGB 7.0*  HCT 21.6*  MCV 90.4  PLT 637   Basic Metabolic Panel: Recent Labs  Lab 06/18/17 1142  NA 132*  K 4.0  CL 99*  CO2 22  GLUCOSE 108*  BUN 36*  CREATININE 1.86*  CALCIUM 8.3*   GFR: Estimated Creatinine Clearance: 33.6 mL/min (A) (by C-G formula based on SCr of 1.86 mg/dL (H)). Liver Function Tests: Recent Labs  Lab 06/18/17 1142  AST 56*  ALT 32  ALKPHOS 82  BILITOT 0.8  PROT 5.6*  ALBUMIN 3.1*   Recent Labs  Lab 06/18/17 1142  LIPASE 35   No results for input(s): AMMONIA in the last 168 hours. Coagulation Profile: Recent Labs  Lab 06/18/17 1142  INR 4.43*   Cardiac Enzymes: Recent Labs  Lab 06/18/17 1142  TROPONINI <0.03   BNP (last 3 results) No results for input(s): PROBNP in the last 8760 hours. HbA1C: No results for input(s): HGBA1C in the  last 72 hours. CBG: No results for input(s): GLUCAP in the last 168 hours. Lipid Profile: No results for input(s): CHOL, HDL, LDLCALC, TRIG, CHOLHDL, LDLDIRECT in the last 72 hours. Thyroid Function Tests: No results for input(s): TSH, T4TOTAL, FREET4, T3FREE, THYROIDAB in the last 72 hours. Anemia Panel: No results for input(s): VITAMINB12, FOLATE, FERRITIN, TIBC, IRON, RETICCTPCT in the last 72 hours. Urine analysis:    Component Value Date/Time   COLORURINE AMBER (A) 10/15/2016 0320   APPEARANCEUR CLEAR 10/15/2016 0320   LABSPEC >1.046 (H) 10/15/2016 0320   PHURINE 5.0 10/15/2016 0320   GLUCOSEU 50 (A) 10/15/2016 0320   HGBUR MODERATE (A) 10/15/2016  0320   BILIRUBINUR NEGATIVE 10/15/2016 0320   KETONESUR 5 (A) 10/15/2016 0320   PROTEINUR NEGATIVE 10/15/2016 0320   UROBILINOGEN 0.2 12/01/2013 0740   NITRITE NEGATIVE 10/15/2016 0320   LEUKOCYTESUR NEGATIVE 10/15/2016 0320   Sepsis Labs: @LABRCNTIP (procalcitonin:4,lacticidven:4) )No results found for this or any previous visit (from the past 240 hour(s)).   Radiological Exams on Admission: Dg Chest 2 View  Result Date: 06/18/2017 CLINICAL DATA:  Pt brought in from PCP by EMS due to hypotension. Pt was to be seen due dizziness and falls for weeks. Pt seen here and placed on iron pills. BP in PCP office initially 68/40 . EMS reports 90/62. Denies pain. Pt reports dizzine.*comment was truncated* EXAM: CHEST - 2 VIEW COMPARISON:  06/08/2017 FINDINGS: Normal cardiac silhouette. Lungs are mildly hyperinflated. No effusion, infiltrate or pneumothorax. Calcified pulmonary nodules unchanged from prior. IMPRESSION: No acute cardiopulmonary process. Electronically Signed   By: Suzy Bouchard M.D.   On: 06/18/2017 12:48   Ct Head Wo Contrast  Result Date: 06/18/2017 CLINICAL DATA:  Head trauma.  Hypotension. Initial encounter. EXAM: CT HEAD WITHOUT CONTRAST TECHNIQUE: Contiguous axial images were obtained from the base of the skull through  the vertex without intravenous contrast. COMPARISON:  None. FINDINGS: Brain: No evidence of acute infarction, hemorrhage, hydrocephalus, extra-axial collection or mass lesion/mass effect. Nonspecific atrophy with ventriculomegaly. Chronic small vessel ischemia in the periventricular white matter. Vascular: Atherosclerotic calcification.  No hyperdense vessel. Skull: Negative Sinuses/Orbits: No acute finding.  Left cataract resection. IMPRESSION: No evidence of intracranial injury. Electronically Signed   By: Monte Fantasia M.D.   On: 06/18/2017 12:09    EKG: Independently reviewed.  Atrial fibrillation with some PVCs  Assessment/Plan Principal Problem:   Orthostasis Active Problems:   Type 2 diabetes mellitus with diabetic nephropathy (HCC)   Hypertension   Chronic kidney disease (CKD), stage III (moderate) (HCC)   Hypothyroidism   A-fib (HCC)   HLD (hyperlipidemia)   Normocytic anemia   Chronic diastolic CHF (congestive heart failure) (HCC)    Generalized weakness -Suspect mainly related to symptomatic anemia and orthostasis. -Will also rule out hypothyroidism and vitamin B12 deficiency. -Obtain PT evaluation after transfusion.  Symptomatic anemia -MCV is normocytic, patient does state he has had dark stools, however has been on an iron supplement. -Check stool for occult blood, check anemia panel prior to transfusion. -We will transfuse at least 2 units of PRBCs for now. -Holding Coumadin. -Would consider requesting GI consultation given necessity of restarting Coumadin in the near future due to his stroke risk with A. fib.  Orthostasis -Likely due to weakness and decreased oral intake in addition to anemia. -We will place on normal saline for 24 hours given history of CHF. -Will also be transfused 2 units of PRBCs.  Hypothyroidism -Check TSH, continue home dose of Synthroid.  Chronic diastolic heart failure -Reviewed echo report from August 2018: Ejection fraction of  50-55% with moderate LVH, no mention of diastolic function. -Appears compensated, if anything is hypovolemic. -We will give blood and 24 hours of IV fluids, will need to reassess hydration status at that point.  Type 2 diabetes -Check A1c, place on a sensitive sliding scale.  Chronic atrial fibrillation -Rate controlled, BP meds on hold due to hypotension and orthostasis. -Coumadin on hold due to symptomatic anemia.     DVT prophylaxis: SCDs Code Status: Full COde Family Communication: Patient only Disposition Plan: Transfer to [employer] [facility] called: None Admission status: Inpatient    Time Spent: 85 minutes  Lelon Frohlich MD [facility] Pager [phone]  If 7PM-7AM, please contact night-coverage [email or web address] Password [email or web address]  06/18/2017, 3:12 PM

## 2017-06-18 NOTE — ED Notes (Signed)
CRITICAL VALUE ALERT  Critical Value:  INR 4.43  Date & Time Notied:  06/18/17 1231  Provider Notified: Notified Dr. Thurnell Garbe   Orders Received/Actions taken: Notified MD

## 2017-06-18 NOTE — ED Notes (Signed)
This nurse passed daughter in law in hall and made rude comment as I walked by

## 2017-06-18 NOTE — ED Notes (Signed)
Male family member speaking with AD right now.

## 2017-06-18 NOTE — Progress Notes (Addendum)
Notified [facility] states they are unable to see order to prepare blood. Notified [facility] to renew order for transfusion. Awaiting response from provider. Patient stable no complaints at this time. Will continue to monitor patient.   Update  Order renewed. Consent obtained. Blood transfusion started. Will continue to monitor patient.

## 2017-06-19 ENCOUNTER — Other Ambulatory Visit: Payer: Self-pay

## 2017-06-19 DIAGNOSIS — I4891 Unspecified atrial fibrillation: Secondary | ICD-10-CM

## 2017-06-19 DIAGNOSIS — I10 Essential (primary) hypertension: Secondary | ICD-10-CM

## 2017-06-19 DIAGNOSIS — E785 Hyperlipidemia, unspecified: Secondary | ICD-10-CM

## 2017-06-19 DIAGNOSIS — D649 Anemia, unspecified: Principal | ICD-10-CM

## 2017-06-19 DIAGNOSIS — E1121 Type 2 diabetes mellitus with diabetic nephropathy: Secondary | ICD-10-CM

## 2017-06-19 DIAGNOSIS — R531 Weakness: Secondary | ICD-10-CM

## 2017-06-19 DIAGNOSIS — R791 Abnormal coagulation profile: Secondary | ICD-10-CM

## 2017-06-19 DIAGNOSIS — E038 Other specified hypothyroidism: Secondary | ICD-10-CM

## 2017-06-19 DIAGNOSIS — E860 Dehydration: Secondary | ICD-10-CM

## 2017-06-19 LAB — GLUCOSE, CAPILLARY
GLUCOSE-CAPILLARY: 78 mg/dL (ref 65–99)
GLUCOSE-CAPILLARY: 90 mg/dL (ref 65–99)
Glucose-Capillary: 78 mg/dL (ref 65–99)
Glucose-Capillary: 77 mg/dL (ref 65–99)

## 2017-06-19 LAB — HEMOGLOBIN AND HEMATOCRIT, BLOOD
HCT: 25.6 % — ABNORMAL LOW (ref 39.0–52.0)
Hemoglobin: 8.4 g/dL — ABNORMAL LOW (ref 13.0–17.0)

## 2017-06-19 LAB — PROTIME-INR
INR: 3.71
PROTHROMBIN TIME: 36.5 s — AB (ref 11.4–15.2)

## 2017-06-19 MED ORDER — SODIUM CHLORIDE 0.9 % IV SOLN
INTRAVENOUS | Status: DC
  Administered 2017-06-21: 10 mL/h via INTRAVENOUS

## 2017-06-19 MED ORDER — PHYTONADIONE 5 MG PO TABS
5.0000 mg | ORAL_TABLET | Freq: Once | ORAL | Status: AC
  Administered 2017-06-19: 5 mg via ORAL
  Filled 2017-06-19: qty 1

## 2017-06-19 MED ORDER — PEG 3350-KCL-NA BICARB-NACL 420 G PO SOLR
4000.0000 mL | Freq: Once | ORAL | Status: AC
  Administered 2017-06-19: 4000 mL via ORAL
  Filled 2017-06-19: qty 4000

## 2017-06-19 MED ORDER — ALBUTEROL SULFATE (2.5 MG/3ML) 0.083% IN NEBU: 2.5000 mg | INHALATION_SOLUTION | Freq: Four times a day (QID) | RESPIRATORY_TRACT | Status: DC | PRN

## 2017-06-19 NOTE — Progress Notes (Signed)
Responded to [facility] to support patient and assist with AD.  Patient want AD left with him to discuss with family. Patient will have nurse page Chaplain when ready.  Chaplain will follow as needed.   06/19/17 1041  Clinical Encounter Type  Visited With Patient and family together;Health care provider  Visit Type Initial;Spiritual support  Referral From Physician  Spiritual Encounters  Spiritual Needs Literature;Emotional  Stress Factors  Patient Stress Factors None identified  Cristopher Peru, [facility], Pager [phone]

## 2017-06-19 NOTE — Plan of Care (Signed)
  Education: Knowledge of General Education information will improve 06/19/2017 0202 - Progressing by Theador Hawthorne, RN   Health Behavior/Discharge Planning: Ability to manage health-related needs will improve 06/19/2017 0202 - Progressing by Theador Hawthorne, RN   Activity: Risk for activity intolerance will decrease 06/19/2017 0202 - Progressing by Theador Hawthorne, RN   Cardiac: Ability to achieve and maintain adequate cardiopulmonary perfusion will improve 06/19/2017 0202 - Progressing by Theador Hawthorne, RN

## 2017-06-19 NOTE — Progress Notes (Signed)
PT Cancellation Note  Patient Details Name: Brett [name] MRN: [id number] DOB: 08-Nov-1932   Cancelled Treatment:    Reason Eval/Treat Not Completed: Medical issues which prohibited therapyLow BP with high INR, will ck later to see how pt is progressing.   Ramond Dial 06/19/2017, 9:26 AM   Mee Hives, PT MS Acute Rehab Dept. Number: [place]

## 2017-06-19 NOTE — Progress Notes (Signed)
PROGRESS NOTE    Brett [name]  ACZ:[id number] DOB: 06-Feb-1933 DOA: 06/18/2017 PCP: [name], MD   Brief Narrative:  HPI on 06/18/2017 by Dr. [name] Brett [name] is a 84 y.o. male with history of atrial fibrillation on chronic anticoagulation with Coumadin, chronic kidney disease stage III with a baseline creatinine around 1.8, hypertension, hypothyroidism and type 2 diabetes who comes to the hospital today after EMS was called due to hypotension and weakness.  Reportedly blood pressure was initially systolic of 60 and improved to 90 prior to transport.  Of note patient has had 2 ED visits in February for weakness.  At one point his metoprolol dose was reduced due to a first-degree AV block and his weakness.  Patient tells me that in addition to fatigue he has been having dizziness upon standing and has fallen a few times causing significant skin tears to his right forearm.  Has never lost consciousness, has not hit his head.  He has also noticed dark stools, however has been on an iron supplement.  He was found to be orthostatic in the ED with systolic blood pressure dropping from the 90s-70s and pulse rate increasing from 80s to 100s while going from sitting to standing.,  Labs are significant for hemoglobin of 7.0 with an MCV of 90.4, troponin of less than 0.03, creatinine at baseline of 1.86.  CT scan of the head is negative, chest x-ray without acute cardiopulmonary process.  Admission is requested.  Of note, there have apparently been several difficult encounters with patient's family.  Family is insistent on transfer to [facility], has apparently been aggressive and combative with staff to the point where security needed to be notified.  When I assessed the patient no family was at bedside and patient was calm.   Assessment & Plan   Symptomatic anemia/Acute on chronic anemia -possibly GIB -Hemoglobin on admission was 7, baseline appears to be 9-11 -Patient was transfused 2  units PRBC, hemoglobin today 8.4 -Gastroenterology consulted and appreciated -Patient states he had a colonoscopy approximately 7 years ago which did show polyps as well as an endoscopy July 2018 -INR is currently 3.71, will give oral vitamin K. -Plan is for endoscopy and colonoscopy on 06/20/2016 if INR is less than 2 -Continue to monitor CBC  Generalized weakness -Likely secondary to symptomatic anemia -Will order TSH -Vitamin B12: 553 -Will consult PT  Orthostasis -Suspect secondary to anemia and decreased oral intake -Patient was given 2 units PRBC as well as normal saline -Will order repeat orthostatic vitals  Hypothyroidism -TSH pending -Continue Synthroid  Chronic diastolic heart failure -Patient currently appears to be euvolemic and compensated -Echocardiogram August 20 18 showed an EF of 50-55% -monitor Intake and output, daily weights  Diabetes mellitus, type II -Hemoglobin A1c 4.7  -Continue insulin sliding scale and CBG monitoring  Chronic atrial fibrillation -Coumadin currently on hold -INR was 4.7 on admission, currently down to 3.71 -As stated above, will give oral vitamin K and monitor her INR this evening at approximately 10 PM.  DVT Prophylaxis  SCDs  Code Status: Full  Family Communication: None at bedside  Disposition Plan: Admitted. Pending improvement in INR- EGD/colonoscopy. Suspect home when stable.   Consultants Gastroenterology  Procedures  None  Antibiotics   Anti-infectives (From admission, onward)   None      Subjective:   Brett [name] seen and examined today.  Patient continues to feel tired and weak.  Denies any hematochezia.  States he takes  iron therefore his stools are normally dark in color.  Denies any chest pain, shortness of breath, abdominal pain, nausea or vomiting, diarrhea constipation, headache.  Objective:   Vitals:   06/19/17 0302 06/19/17 0420 06/19/17 0451 06/19/17 1344  BP: (!) 84/38 (!) 100/35 (!) 95/55  (!) 145/62  Pulse: (!) 110 94 89 87  Resp: 16 16 17 16   Temp: 98.6 F (37 C) 98.7 F (37.1 C) 98.6 F (37 C) 98 F (36.7 C)  TempSrc: Oral Oral Oral Oral  SpO2:  100% 99% 96%  Weight:   99.8 kg (220 lb)   Height:        Intake/Output Summary (Last 24 hours) at 06/19/2017 1439 Last data filed at 06/19/2017 1128 Gross per 24 hour  Intake 1754 ml  Output 1400 ml  Net 354 ml   Filed Weights   06/18/17 1012 06/19/17 0451  Weight: 88.5 kg (195 lb) 99.8 kg (220 lb)    Exam  General: Well developed, well nourished, NAD, appears stated age  HEENT: NCAT, mucous membranes moist.   Neck: Supple  Cardiovascular: S1 S2 auscultated, regular  Respiratory: Clear to auscultation bilaterally with equal chest rise  Abdomen: Soft, nontender, nondistended, + bowel sounds  Extremities: warm dry without cyanosis clubbing or edema  Neuro: AAOx3, nonfocal  Skin: Without rashes exudates or nodules, pale  Psych: Normal affect and demeanor with intact judgement and insight   Data Reviewed: I have personally reviewed following labs and imaging studies  CBC: Recent Labs  Lab 06/18/17 1142 06/19/17 0702  WBC 5.2  --   NEUTROABS 2.7  --   HGB 7.0* 8.4*  HCT 21.6* 25.6*  MCV 90.4  --   PLT 201  --    Basic Metabolic Panel: Recent Labs  Lab 06/18/17 1142  NA 132*  K 4.0  CL 99*  CO2 22  GLUCOSE 108*  BUN 36*  CREATININE 1.86*  CALCIUM 8.3*   GFR: Estimated Creatinine Clearance: 37.5 mL/min (A) (by C-G formula based on SCr of 1.86 mg/dL (H)). Liver Function Tests: Recent Labs  Lab 06/18/17 1142  AST 56*  ALT 32  ALKPHOS 82  BILITOT 0.8  PROT 5.6*  ALBUMIN 3.1*   Recent Labs  Lab 06/18/17 1142  LIPASE 35   No results for input(s): AMMONIA in the last 168 hours. Coagulation Profile: Recent Labs  Lab 06/18/17 1142 06/19/17 1245  INR 4.43* 3.71   Cardiac Enzymes: Recent Labs  Lab 06/18/17 1142  TROPONINI <0.03   BNP (last 3 results) No results for  input(s): PROBNP in the last 8760 hours. HbA1C: Recent Labs    06/18/17 1140  HGBA1C 4.7*   CBG: Recent Labs  Lab 06/18/17 2204 06/19/17 0746 06/19/17 1130  GLUCAP 107* 78 78   Lipid Profile: No results for input(s): CHOL, HDL, LDLCALC, TRIG, CHOLHDL, LDLDIRECT in the last 72 hours. Thyroid Function Tests: No results for input(s): TSH, T4TOTAL, FREET4, T3FREE, THYROIDAB in the last 72 hours. Anemia Panel: Recent Labs    06/18/17 1140  VITAMINB12 533  FERRITIN 70  TIBC 309  IRON 74   Urine analysis:    Component Value Date/Time   COLORURINE AMBER (A) 10/15/2016 0320   APPEARANCEUR CLEAR 10/15/2016 0320   LABSPEC >1.046 (H) 10/15/2016 0320   PHURINE 5.0 10/15/2016 0320   GLUCOSEU 50 (A) 10/15/2016 0320   HGBUR MODERATE (A) 10/15/2016 0320   BILIRUBINUR NEGATIVE 10/15/2016 0320   KETONESUR 5 (A) 10/15/2016 0320   PROTEINUR NEGATIVE 10/15/2016 0320  UROBILINOGEN 0.2 12/01/2013 0740   NITRITE NEGATIVE 10/15/2016 0320   LEUKOCYTESUR NEGATIVE 10/15/2016 0320   Sepsis Labs: @LABRCNTIP (procalcitonin:4,lacticidven:4)  )No results found for this or any previous visit (from the past 240 hour(s)).    Radiology Studies: Dg Chest 2 View  Result Date: 06/18/2017 CLINICAL DATA:  Pt brought in from PCP by EMS due to hypotension. Pt was to be seen due dizziness and falls for weeks. Pt seen here and placed on iron pills. BP in PCP office initially 68/40 . EMS reports 90/62. Denies pain. Pt reports dizzine.*comment was truncated* EXAM: CHEST - 2 VIEW COMPARISON:  06/08/2017 FINDINGS: Normal cardiac silhouette. Lungs are mildly hyperinflated. No effusion, infiltrate or pneumothorax. Calcified pulmonary nodules unchanged from prior. IMPRESSION: No acute cardiopulmonary process. Electronically Signed   By: Suzy Bouchard M.D.   On: 06/18/2017 12:48   Ct Head Wo Contrast  Result Date: 06/18/2017 CLINICAL DATA:  Head trauma.  Hypotension. Initial encounter. EXAM: CT HEAD WITHOUT  CONTRAST TECHNIQUE: Contiguous axial images were obtained from the base of the skull through the vertex without intravenous contrast. COMPARISON:  None. FINDINGS: Brain: No evidence of acute infarction, hemorrhage, hydrocephalus, extra-axial collection or mass lesion/mass effect. Nonspecific atrophy with ventriculomegaly. Chronic small vessel ischemia in the periventricular white matter. Vascular: Atherosclerotic calcification.  No hyperdense vessel. Skull: Negative Sinuses/Orbits: No acute finding.  Left cataract resection. IMPRESSION: No evidence of intracranial injury. Electronically Signed   By: Monte Fantasia M.D.   On: 06/18/2017 12:09     Scheduled Meds: . amiodarone  200 mg Oral Daily  . B-complex with vitamin C  1 tablet Oral Daily  . ferrous sulfate  325 mg Oral Daily  . insulin aspart  0-5 Units Subcutaneous QHS  . insulin aspart  0-9 Units Subcutaneous TID WC  . insulin aspart  3 Units Subcutaneous TID WC  . levothyroxine  50 mcg Oral BH-q7a  . phytonadione  5 mg Oral Once  . polyethylene glycol-electrolytes  4,000 mL Oral Once   Continuous Infusions: . sodium chloride    . sodium chloride 75 mL/hr at 06/18/17 2347  . sodium chloride 10 mL (06/19/17 1257)     LOS: 1 day   Time Spent in minutes   30 minutes  Kedar Sedano D.O. on 06/19/2017 at 2:39 PM  Between 7am to 7pm - Pager - [phone]  After 7pm go to [email or web address] - password [email or web address]  And look for the night coverage person covering for me after hours  [facility] Office  [phone]

## 2017-06-19 NOTE — Consult Note (Signed)
Referring Provider: Dr. Ree Kida Primary Care Physician:  [name], MD Primary Gastroenterologist:  Althia Forts  Reason for Consultation:  Anemia  HPI: Brett [name] is a 84 y.o. male with multiple medical problems as stated below who is on chronic anticoagulation due to Afib and INR on admit of 4.43. Has been having severe weakness and was hypotensive on presentation that improved with volume. Black stools on iron pills. Denies red blood per rectum. Denies abdominal pain, nausea, or vomiting. Denies heartburn or dysphagia. Reports a normal colonoscopy 8 years ago (records not available at this time). Hgb 7 on admit (10.3 on 06/08/17). Hgb 8.4 today. Feels ok.  Past Medical History:  Diagnosis Date  . Atrial fibrillation ([place])    a. s/p DCCV in 12/2016, remains on Coumadin and Amiodarone  . CAD (coronary artery disease)    a. s/p POBA of D1 in 2009 with residual 20-30% stenosis along RCA and LAD  . Chronic kidney disease (CKD), stage III (moderate) ([facility])   . Dysrhythmia    AFib  . Essential hypertension, benign   . History of cardiac catheterization    Details not certain - reportedly no interventions  . Hypothyroidism   . Macular degeneration   . Pulmonary hypertension ([place]) 12/03/2013   PA pressure 56. Ejection fraction 55-60%  . Type 2 diabetes mellitus ([place])     Past Surgical History:  Procedure Laterality Date  . CARDIOVERSION N/A 12/10/2013   Procedure: CARDIOVERSION;  Surgeon: Herminio Commons, MD;  Location: [facility];  Service: Endoscopy;  Laterality: N/A;  . CARDIOVERSION N/A 11/15/2016   Procedure: CARDIOVERSION;  Surgeon: Arnoldo Lenis, MD;  Location: [facility];  Service: Endoscopy;  Laterality: N/A;  . CARDIOVERSION N/A 12/18/2016   Procedure: CARDIOVERSION;  Surgeon: Arnoldo Lenis, MD;  Location: [facility];  Service: Endoscopy;  Laterality: N/A;  pt knows to arrive at 8:00  . CATARACT EXTRACTION W/PHACO Left 11/13/2015   Procedure: CATARACT EXTRACTION PHACO AND  INTRAOCULAR LENS PLACEMENT LEFT EYE CDE=7.76;  Surgeon: Williams Che, MD;  Location: [facility];  Service: Ophthalmology;  Laterality: Left;  . CHOLECYSTECTOMY    . COLONOSCOPY     Remote > 10 years ago  . ERCP N/A 10/17/2016   Dr. Gala Romney: sphincterotomy and stone extraction   . REMOVAL OF STONES  10/17/2016   Procedure: REMOVAL OF STONES with balloon;  Surgeon: Daneil Dolin, MD;  Location: [facility];  Service: Endoscopy;;  . REPLACEMENT TOTAL HIP W/  RESURFACING IMPLANTS Bilateral   . SPHINCTEROTOMY  10/17/2016   Procedure: BILLARY SPHINCTEROTOMY with ballon dilation;  Surgeon: Daneil Dolin, MD;  Location: [facility];  Service: Endoscopy;;  . TEE WITHOUT CARDIOVERSION N/A 12/10/2013   Procedure: TRANSESOPHAGEAL ECHOCARDIOGRAM (TEE);  Surgeon: Herminio Commons, MD;  Location: [facility];  Service: Endoscopy;  Laterality: N/A;    Prior to Admission medications   Medication Sig Start Date End Date Taking? Authorizing Provider  albuterol (PROVENTIL HFA;VENTOLIN HFA) 108 (90 Base) MCG/ACT inhaler Inhale 1-2 puffs into the lungs every 6 (six) hours as needed for wheezing or shortness of breath.   Yes [provider]  amiodarone (PACERONE) 200 MG tablet Take 1 tablet (200 mg total) by mouth daily. 04/22/17  Yes Strader, Fransisco Hertz, PA-C  aspirin EC 81 MG EC tablet Take 1 tablet (81 mg total) by mouth daily. 12/23/16  Yes Isaac Bliss, Rayford Halsted, MD  b complex vitamins tablet Take 1 tablet by mouth daily.   Yes  [provider]  diphenhydrAMINE (BENADRYL) 12.5 MG/5ML elixir Take 50 mg by mouth at bedtime as needed for allergies or sleep.   Yes [provider]  doxazosin (CARDURA) 2 MG tablet Take 1 tablet (2 mg total) by mouth at bedtime. 04/24/15  Yes Herminio Commons, MD  ferrous sulfate 325 (65 FE) MG tablet Take 1 tablet (325 mg total) by mouth daily. 04/14/17  Yes Nat Christen, MD  furosemide (LASIX) 40 MG tablet Take 1 tablet (40 mg total) by mouth daily. 12/22/16  06/18/17 Yes Erline Hau, MD  levothyroxine (SYNTHROID, LEVOTHROID) 50 MCG tablet Take 50 mcg by mouth daily before breakfast.    Yes [provider]  MELATONIN PO Take 4 mg by mouth at bedtime. 2 mg tablets   Yes [provider]  Tiotropium Bromide-Olodaterol (STIOLTO RESPIMAT) 2.5-2.5 MCG/ACT AERS Inhale 1 puff into the lungs daily.   Yes [provider]  warfarin (COUMADIN) 3 MG tablet Take 1 tablet (3 mg total) by mouth daily at 6 PM. Patient taking differently: Take 3 mg by mouth daily at 6 PM. 1/2 tablet on Monday, Thursday 10/20/16  Yes Tat, Shanon Brow, MD  metFORMIN (GLUCOPHAGE) 500 MG tablet Take 1 tablet (500 mg total) by mouth 2 (two) times daily with a meal. Patient not taking: Reported on 06/18/2017 11/16/16   Orvan Falconer, MD    Scheduled Meds: . amiodarone  200 mg Oral Daily  . B-complex with vitamin C  1 tablet Oral Daily  . ferrous sulfate  325 mg Oral Daily  . insulin aspart  0-5 Units Subcutaneous QHS  . insulin aspart  0-9 Units Subcutaneous TID WC  . insulin aspart  3 Units Subcutaneous TID WC  . levothyroxine  50 mcg Oral BH-q7a   Continuous Infusions: . sodium chloride    . sodium chloride 75 mL/hr at 06/18/17 2347   PRN Meds:.acetaminophen **OR** acetaminophen, albuterol, diphenhydrAMINE, ondansetron **OR** ondansetron (ZOFRAN) IV, senna-docusate  Allergies as of 06/18/2017  . (No Known Allergies)    Family History  Problem Relation Age of Onset  . Hypertension Mother   . Hypertension Father   . Colon cancer Neg Hx     Social History   Socioeconomic History  . Marital status: Single    Spouse name: Not on file  . Number of children: Not on file  . Years of education: Not on file  . Highest education level: Not on file  Social Needs  . Financial resource strain: Not on file  . Food insecurity - worry: Not on file  . Food insecurity - inability: Not on file  . Transportation needs - medical: Not on file  . Transportation  needs - non-medical: Not on file  Occupational History  . Not on file  Tobacco Use  . Smoking status: Former Smoker    Packs/day: 1.00    Years: 37.00    Pack years: 37.00    Types: Cigarettes    Start date: 11/01/1952    Last attempt to quit: 08/18/1978    Years since quitting: 38.8  . Smokeless tobacco: Never Used  Substance and Sexual Activity  . Alcohol use: Yes    Alcohol/week: 0.0 oz    Comment: Occasional  . Drug use: No  . Sexual activity: Not Currently  Other Topics Concern  . Not on file  Social History Narrative  . Not on file    Review of Systems: All negative except as stated above in HPI.  Physical Exam: Vital  signs: Vitals:   06/19/17 0420 06/19/17 0451  BP: (!) 100/35 (!) 95/55  Pulse: 94 89  Resp: 16 17  Temp: 98.7 F (37.1 C) 98.6 F (37 C)  SpO2: 100% 99%   Last BM Date: 06/17/17 General:   Lethargic, elderly, well-nourished, no acute distress Head: normocephalic, atraumatic Eyes: anicteric sclera ENT: oropharynx clear Neck: supple, nontender Lungs:  Clear throughout to auscultation.   No wheezes, crackles, or rhonchi. No acute distress. Heart:  Regular rate and rhythm; no murmurs, clicks, rubs,  or gallops. Abdomen: soft, nontender, nondistended, +BS  Rectal:  Deferred Ext: no edema  GI:  Lab Results: Recent Labs    06/18/17 1142 06/19/17 0702  WBC 5.2  --   HGB 7.0* 8.4*  HCT 21.6* 25.6*  PLT 201  --    BMET Recent Labs    06/18/17 1142  NA 132*  K 4.0  CL 99*  CO2 22  GLUCOSE 108*  BUN 36*  CREATININE 1.86*  CALCIUM 8.3*   LFT Recent Labs    06/18/17 1142  PROT 5.6*  ALBUMIN 3.1*  AST 56*  ALT 32  ALKPHOS 82  BILITOT 0.8   PT/INR Recent Labs    06/18/17 1142  LABPROT 41.9*  INR 4.43*     Studies/Results: Dg Chest 2 View  Result Date: 06/18/2017 CLINICAL DATA:  Pt brought in from PCP by EMS due to hypotension. Pt was to be seen due dizziness and falls for weeks. Pt seen here and placed on iron pills. BP  in PCP office initially 68/40 . EMS reports 90/62. Denies pain. Pt reports dizzine.*comment was truncated* EXAM: CHEST - 2 VIEW COMPARISON:  06/08/2017 FINDINGS: Normal cardiac silhouette. Lungs are mildly hyperinflated. No effusion, infiltrate or pneumothorax. Calcified pulmonary nodules unchanged from prior. IMPRESSION: No acute cardiopulmonary process. Electronically Signed   By: Suzy Bouchard M.D.   On: 06/18/2017 12:48   Ct Head Wo Contrast  Result Date: 06/18/2017 CLINICAL DATA:  Head trauma.  Hypotension. Initial encounter. EXAM: CT HEAD WITHOUT CONTRAST TECHNIQUE: Contiguous axial images were obtained from the base of the skull through the vertex without intravenous contrast. COMPARISON:  None. FINDINGS: Brain: No evidence of acute infarction, hemorrhage, hydrocephalus, extra-axial collection or mass lesion/mass effect. Nonspecific atrophy with ventriculomegaly. Chronic small vessel ischemia in the periventricular white matter. Vascular: Atherosclerotic calcification.  No hyperdense vessel. Skull: Negative Sinuses/Orbits: No acute finding.  Left cataract resection. IMPRESSION: No evidence of intracranial injury. Electronically Signed   By: Monte Fantasia M.D.   On: 06/18/2017 12:09    Impression/Plan: Severe symptomatic anemia without any overt bleeding. Supratherapeutic INR of 4.43 on admit and Coumadin on hold. Recheck INR and may need reversal if overt bleeding occurs. Needs an EGD/colon during this admit and timing will depend on INR. If INR less than 2 will do EGD/colon tomorrow with Propofol. Colon prep this afternoon. Clear liquid diet. NPO p MN.    LOS: 1 day   [place] C.  06/19/2017, 12:23 PM  Pager [phone]  AFTER 5 pm or on weekends please call [phone]

## 2017-06-20 ENCOUNTER — Encounter (HOSPITAL_COMMUNITY): Payer: Self-pay | Admitting: Certified Registered"

## 2017-06-20 ENCOUNTER — Encounter (HOSPITAL_COMMUNITY)
Admission: EM | Disposition: A | Discharge status: Skilled Nursing Facility | Payer: Self-pay | Source: Home / Self Care | Attending: Internal Medicine

## 2017-06-20 LAB — GLUCOSE, CAPILLARY
GLUCOSE-CAPILLARY: 73 mg/dL (ref 65–99)
Glucose-Capillary: 76 mg/dL (ref 65–99)
Glucose-Capillary: 114 mg/dL — ABNORMAL HIGH (ref 65–99)
Glucose-Capillary: 111 mg/dL — ABNORMAL HIGH (ref 65–99)

## 2017-06-20 LAB — BASIC METABOLIC PANEL
ANION GAP: 7 (ref 5–15)
BUN: 33 mg/dL — AB (ref 6–20)
CO2: 21 mmol/L — AB (ref 22–32)
Calcium: 7.7 mg/dL — ABNORMAL LOW (ref 8.9–10.3)
Chloride: 106 mmol/L (ref 101–111)
Creatinine, Ser: 1.37 mg/dL — ABNORMAL HIGH (ref 0.61–1.24)
GFR calc Af Amer: 54 mL/min — ABNORMAL LOW (ref >60)
GFR calc non Af Amer: 46 mL/min — ABNORMAL LOW (ref >60)
GLUCOSE: 83 mg/dL (ref 65–99)
Potassium: 4 mmol/L (ref 3.5–5.1)
Sodium: 134 mmol/L — ABNORMAL LOW (ref 135–145)

## 2017-06-20 LAB — CBC
HEMATOCRIT: 20.8 % — AB (ref 39.0–52.0)
HEMOGLOBIN: 6.9 g/dL — AB (ref 13.0–17.0)
MCH: 29.9 pg (ref 26.0–34.0)
MCHC: 33.2 g/dL (ref 30.0–36.0)
MCV: 90 fL (ref 78.0–100.0)
Platelets: 186 10*3/uL (ref 150–400)
RBC: 2.31 MIL/uL — ABNORMAL LOW (ref 4.22–5.81)
RDW: 18.7 % — AB (ref 11.5–15.5)
WBC: 5.5 10*3/uL (ref 4.0–10.5)

## 2017-06-20 LAB — URINALYSIS, ROUTINE W REFLEX MICROSCOPIC
Bilirubin Urine: NEGATIVE
Glucose, UA: NEGATIVE mg/dL
Hgb urine dipstick: NEGATIVE
KETONES UR: NEGATIVE mg/dL
LEUKOCYTES UA: NEGATIVE
NITRITE: NEGATIVE
Protein, ur: NEGATIVE mg/dL
Specific Gravity, Urine: 1.01 (ref 1.005–1.030)
pH: 5 (ref 5.0–8.0)

## 2017-06-20 LAB — PROTIME-INR
INR: 3.97
INR: 3.23
INR: 3.86
PROTHROMBIN TIME: 32.7 s — AB (ref 11.4–15.2)
Prothrombin Time: 37.6 seconds — ABNORMAL HIGH (ref 11.4–15.2)
Prothrombin Time: 38.5 seconds — ABNORMAL HIGH (ref 11.4–15.2)

## 2017-06-20 LAB — MRSA PCR SCREENING: MRSA by PCR: NEGATIVE

## 2017-06-20 LAB — PREPARE RBC (CROSSMATCH)

## 2017-06-20 SURGERY — CANCELLED PROCEDURE

## 2017-06-20 MED ORDER — SODIUM CHLORIDE 0.9 % IV BOLUS (SEPSIS)
500.0000 mL | Freq: Once | INTRAVENOUS | Status: AC
  Administered 2017-06-20: 500 mL via INTRAVENOUS

## 2017-06-20 MED ORDER — PHYTONADIONE 5 MG PO TABS
5.0000 mg | ORAL_TABLET | Freq: Once | ORAL | Status: AC
  Administered 2017-06-20: 5 mg via ORAL
  Filled 2017-06-20: qty 1

## 2017-06-20 MED ORDER — SODIUM CHLORIDE 0.9 % IV SOLN
Freq: Once | INTRAVENOUS | Status: AC
  Administered 2017-06-20: 11:00:00 via INTRAVENOUS

## 2017-06-20 MED ORDER — BENZONATATE 100 MG PO CAPS: 200.0000 mg | ORAL_CAPSULE | Freq: Three times a day (TID) | ORAL | Status: DC

## 2017-06-20 MED ORDER — SODIUM CHLORIDE 0.9 % IV SOLN
Freq: Once | INTRAVENOUS | Status: AC
  Administered 2017-06-20: 20:00:00 via INTRAVENOUS

## 2017-06-20 MED ORDER — FUROSEMIDE 10 MG/ML IJ SOLN
20.0000 mg | Freq: Once | INTRAMUSCULAR | Status: AC
  Administered 2017-06-20: 20 mg via INTRAVENOUS
  Filled 2017-06-20: qty 2

## 2017-06-20 MED ORDER — BENZONATATE 100 MG PO CAPS
200.0000 mg | ORAL_CAPSULE | Freq: Three times a day (TID) | ORAL | Status: DC | PRN
  Administered 2017-06-22: 200 mg via ORAL
  Filled 2017-06-20: qty 2

## 2017-06-20 MED ORDER — POLYETHYLENE GLYCOL 3350 17 GM/SCOOP PO POWD
1.0000 | Freq: Once | ORAL | Status: DC
  Filled 2017-06-20: qty 255

## 2017-06-20 SURGICAL SUPPLY — 24 items

## 2017-06-20 NOTE — Progress Notes (Signed)
PROGRESS NOTE    Brett [name]  KNL:[id number] DOB: May 16, 1932 DOA: 06/18/2017 PCP: [name], MD   Brief Narrative:  HPI on 06/18/2017 by Dr. [name] Seneca Hoback is a 84 y.o. male with history of atrial fibrillation on chronic anticoagulation with Coumadin, chronic kidney disease stage III with a baseline creatinine around 1.8, hypertension, hypothyroidism and type 2 diabetes who comes to the hospital today after EMS was called due to hypotension and weakness.  Reportedly blood pressure was initially systolic of 60 and improved to 90 prior to transport.  Of note patient has had 2 ED visits in February for weakness.  At one point his metoprolol dose was reduced due to a first-degree AV block and his weakness.  Patient tells me that in addition to fatigue he has been having dizziness upon standing and has fallen a few times causing significant skin tears to his right forearm.  Has never lost consciousness, has not hit his head.  He has also noticed dark stools, however has been on an iron supplement.  He was found to be orthostatic in the ED with systolic blood pressure dropping from the 90s-70s and pulse rate increasing from 80s to 100s while going from sitting to standing.,  Labs are significant for hemoglobin of 7.0 with an MCV of 90.4, troponin of less than 0.03, creatinine at baseline of 1.86.  CT scan of the head is negative, chest x-ray without acute cardiopulmonary process.  Admission is requested.  Of note, there have apparently been several difficult encounters with patient's family.  Family is insistent on transfer to [facility], has apparently been aggressive and combative with staff to the point where security needed to be notified.  When I assessed the patient no family was at bedside and patient was calm.  Interim history Admitted for symptomatic anemia.  Patient continues to have black stools overnight, however was given colon prep.  Patient's hemoglobin did drop again  this morning to 6.9.  Transfusing additional units of blood and will continue to monitor INR.  Will likely give FFP. Assessment & Plan   Symptomatic anemia/Acute on chronic anemia -Likely GIB -Hemoglobin on admission was 7, baseline appears to be 9-11 -Patient was transfused 2 units PRBC, hemoglobin rose to 8.4- however this morning down to 6.9. Will transfuse additional 2uPRBC. -Gastroenterology consulted and appreciated- palnning for EGD/colonoscopy when INR  <2 -Patient given 2 doses of oral Vit K 5mg   -INR currently 3.97 (not sure if this is correct as it was down to 3.71 prior to Vit K administration) -Will repeat INR now and continue to monitor -Will order FFP as patient's BP is soft and he appears to still be bleeding -will likely need FFP in the AM -Continue to monitor CBC  Generalized weakness -Likely secondary to symptomatic anemia -Vitamin B12: 553 -Will consult PT when patient is stable  Orthostasis -Suspect secondary to anemia and decreased oral intake -BP currently soft, will transfuse additional 2uPRBC -Will order repeat orthostatic vitals after transfusion and patient stabilized   Hypothyroidism -Continue Synthroid  Chronic diastolic heart failure -Patient currently appears to be euvolemic and compensated -Echocardiogram August 20 18 showed an EF of 50-55% -monitor Intake and output, daily weights  Diabetes mellitus, type II -Hemoglobin A1c 4.7  -Continue insulin sliding scale and CBG monitoring  Chronic atrial fibrillation -Coumadin currently on hold -INR was 4.7 on admission, currently down to 3.71, however after Vit K, rose to 3.97??? -See discussion above -HR currently controlled  Chronic  kidney disease, stage III -Appears to be stable and at baseline. -Continue to monitor BMP  DVT Prophylaxis  SCDs  Code Status: Full  Family Communication: None at bedside  Disposition Plan: Admitted. Will transfer to stepdown unit for closer monitoring.  Continue blood transfusions. Will need EGD/Colonoscopy.   Consultants Gastroenterology  Procedures  None  Antibiotics   Anti-infectives (From admission, onward)   None      Subjective:   Brett [name] seen and examined today.  He is to feel very weak and tired especially after using colon prep yesterday evening.  Continues to complain of dark black stools.  Denies current chest pain, shortness of breath, abdominal pain, nausea or vomiting.  Objective:   Vitals:   06/20/17 0617 06/20/17 0821 06/20/17 1110 06/20/17 1125  BP: (!) 85/54 (!) 90/57 (!) 76/62 (!) 87/60  Pulse:   95 100  Resp: 16 18 18 20   Temp: 97.9 F (36.6 C)  97.7 F (36.5 C) (!) 97.5 F (36.4 C)  TempSrc: Oral  Oral Oral  SpO2:  98% 97% 95%  Weight:      Height:        Intake/Output Summary (Last 24 hours) at 06/20/2017 1236 Last data filed at 06/20/2017 1110 Gross per 24 hour  Intake 1100 ml  Output 2225 ml  Net -1125 ml   Filed Weights   06/18/17 1012 06/19/17 0451 06/20/17 0503  Weight: 88.5 kg (195 lb) 99.8 kg (220 lb) 89.2 kg (196 lb 9.6 oz)   Exam  General: Well developed, well nourished, NAD, appears stated age  84: NCAT,  mucous membranes moist.   Neck: Supple  Cardiovascular: S1 S2 auscultated, irregular  Respiratory: Clear to auscultation bilaterally with equal chest rise  Abdomen: Soft, nontender, nondistended, + bowel sounds  Extremities: warm dry without cyanosis clubbing or edema  Neuro: AAOx3, nonfocal  Skin: Without rashes exudates or nodules, pale  Psych: Appropriate mood and affect  Data Reviewed: I have personally reviewed following labs and imaging studies  CBC: Recent Labs  Lab 06/18/17 1142 06/19/17 0702 06/20/17 0648  WBC 5.2  --  5.5  NEUTROABS 2.7  --   --   HGB 7.0* 8.4* 6.9*  HCT 21.6* 25.6* 20.8*  MCV 90.4  --  90.0  PLT 201  --  952   Basic Metabolic Panel: Recent Labs  Lab 06/18/17 1142 06/20/17 0648  NA 132* 134*  K 4.0 4.0  CL 99*  106  CO2 22 21*  GLUCOSE 108* 83  BUN 36* 33*  CREATININE 1.86* 1.37*  CALCIUM 8.3* 7.7*   GFR: Estimated Creatinine Clearance: 45.6 mL/min (A) (by C-G formula based on SCr of 1.37 mg/dL (H)). Liver Function Tests: Recent Labs  Lab 06/18/17 1142  AST 56*  ALT 32  ALKPHOS 82  BILITOT 0.8  PROT 5.6*  ALBUMIN 3.1*   Recent Labs  Lab 06/18/17 1142  LIPASE 35   No results for input(s): AMMONIA in the last 168 hours. Coagulation Profile: Recent Labs  Lab 06/18/17 1142 06/19/17 1245 06/19/17 2335 06/20/17 0727  INR 4.43* 3.71 3.86 3.97   Cardiac Enzymes: Recent Labs  Lab 06/18/17 1142  TROPONINI <0.03   BNP (last 3 results) No results for input(s): PROBNP in the last 8760 hours. HbA1C: Recent Labs    06/18/17 1140  HGBA1C 4.7*   CBG: Recent Labs  Lab 06/19/17 1130 06/19/17 1704 06/19/17 2128 06/20/17 0811 06/20/17 1149  GLUCAP 78 90 77 76 73   Lipid Profile: No  results for input(s): CHOL, HDL, LDLCALC, TRIG, CHOLHDL, LDLDIRECT in the last 72 hours. Thyroid Function Tests: No results for input(s): TSH, T4TOTAL, FREET4, T3FREE, THYROIDAB in the last 72 hours. Anemia Panel: Recent Labs    06/18/17 1140  VITAMINB12 533  FERRITIN 70  TIBC 309  IRON 74   Urine analysis:    Component Value Date/Time   COLORURINE AMBER (A) 10/15/2016 0320   APPEARANCEUR CLEAR 10/15/2016 0320   LABSPEC >1.046 (H) 10/15/2016 0320   PHURINE 5.0 10/15/2016 0320   GLUCOSEU 50 (A) 10/15/2016 0320   HGBUR MODERATE (A) 10/15/2016 0320   BILIRUBINUR NEGATIVE 10/15/2016 0320   KETONESUR 5 (A) 10/15/2016 0320   PROTEINUR NEGATIVE 10/15/2016 0320   UROBILINOGEN 0.2 12/01/2013 0740   NITRITE NEGATIVE 10/15/2016 0320   LEUKOCYTESUR NEGATIVE 10/15/2016 0320   Sepsis Labs: @LABRCNTIP (procalcitonin:4,lacticidven:4)  )No results found for this or any previous visit (from the past 240 hour(s)).    Radiology Studies: No results found.   Scheduled Meds: . amiodarone  200  mg Oral Daily  . B-complex with vitamin C  1 tablet Oral Daily  . ferrous sulfate  325 mg Oral Daily  . insulin aspart  0-5 Units Subcutaneous QHS  . insulin aspart  0-9 Units Subcutaneous TID WC  . insulin aspart  3 Units Subcutaneous TID WC  . levothyroxine  50 mcg Oral BH-q7a  . polyethylene glycol powder  1 Container Oral Once   Continuous Infusions: . sodium chloride    . sodium chloride 10 mL (06/19/17 1257)     LOS: 2 days   Time Spent in minutes   45 minutes  Clarice Bonaventure D.O. on 06/20/2017 at 12:36 PM  Between 7am to 7pm - Pager - [phone]  After 7pm go to [email or web address] - password [email or web address]  And look for the night coverage person covering for me after hours  [facility] Office  [phone]

## 2017-06-20 NOTE — Progress Notes (Signed)
[facility] Gastroenterology Progress Note  SARTHAK RUBENSTEIN 84 y.o. 02/23/1933   Subjective: Black stools overnight. Denies abdominal pain. Fiance' at bedside.   Objective: Vital signs: Vitals:   06/20/17 1110 06/20/17 1125  BP: (!) 76/62 (!) 87/60  Pulse: 95 100  Resp: 18 20  Temp: 97.7 F (36.5 C) (!) 97.5 F (36.4 C)  SpO2: 97% 95%    Physical Exam: Gen: lethargic, elderly, no acute distress  HEENT: anicteric sclera CV: RRR Chest: CTA B Abd: soft, nontender, nondistended, +BS Ext: no edema  Lab Results: Recent Labs    06/18/17 1142 06/20/17 0648  NA 132* 134*  K 4.0 4.0  CL 99* 106  CO2 22 21*  GLUCOSE 108* 83  BUN 36* 33*  CREATININE 1.86* 1.37*  CALCIUM 8.3* 7.7*   Recent Labs    06/18/17 1142  AST 56*  ALT 32  ALKPHOS 82  BILITOT 0.8  PROT 5.6*  ALBUMIN 3.1*   Recent Labs    06/18/17 1142 06/19/17 0702 06/20/17 0648  WBC 5.2  --  5.5  NEUTROABS 2.7  --   --   HGB 7.0* 8.4* 6.9*  HCT 21.6* 25.6* 20.8*  MCV 90.4  --  90.0  PLT 201  --  186      Assessment/Plan: Severe anemia - Hgb drop to 6.9. INR increased to 3.9 so GI procedures postponed today until INR corrected. Agree with blood transfusions. Would give FFP as well. Completed colon prep yesterday. Tentatively on schedule for EGD/colon to be done tomorrow morning by Dr. Watt Climes if INR is corrected. Will give Miralax prep today. Clear liquid diet. NPO p MN.  [place] C. 06/20/2017, 12:29 PM  Pager [phone]  AFTER 5 PM or on weekends please call [phone] ID: JAYKO VOORHEES, male   DOB: October 14, 1932, 84 y.o.   MRN: [id number]

## 2017-06-20 NOTE — Progress Notes (Signed)
Paged [facility] to notify of BP 85/54. Order to give 500cc bolus. Order initiated will continue to monitor patient.

## 2017-06-20 NOTE — Progress Notes (Signed)
PT Cancellation Note  Patient Details Name: Brett [name] MRN: [id number] DOB: 1932/04/16   Cancelled Treatment:    Reason Eval/Treat Not Completed: Medical issues which prohibited therapy Patient continues to have low BP, also hemoglobin at 6.9 today. RN requests hold due to medical status today. Will continue to monitor patient status and return for skilled PT evaluation when patient is medically appropriate.    Deniece Ree PT, DPT, CBIS  Supplemental Physical Therapist [facility]   Pager [phone]

## 2017-06-20 NOTE — Progress Notes (Signed)
Patient became nauseous while drinking bowel prep. RN gave zofran with some relief. Patient attempted to finish remaining prep. States he feels sick to his stomach and does not want to finish anymore of bowel prep. Patient has had 1 large bm with some formed stool and mostly black colored liquid. 250cc of bowel prep remaining. Will attempt to get patient to finish remaninig bowel prep. No other complaints at this time. Will continue to monitor patient.

## 2017-06-20 NOTE — Plan of Care (Signed)
  Education: Knowledge of General Education information will improve 06/20/2017 0101 - Progressing by Theador Hawthorne, RN   Clinical Measurements: Will remain free from infection 06/20/2017 0101 - Progressing by Theador Hawthorne, RN   Activity: Risk for activity intolerance will decrease 06/20/2017 0101 - Progressing by Theador Hawthorne, RN   Nutrition: Adequate nutrition will be maintained 06/20/2017 0101 - Progressing by Theador Hawthorne, RN   Cardiac: Ability to achieve and maintain adequate cardiopulmonary perfusion will improve 06/20/2017 0101 - Progressing by Theador Hawthorne, RN

## 2017-06-20 NOTE — Progress Notes (Signed)
Patient blood pressure improved after 1 unit of PRBCs.  Patient report given to receiving RN at [facility]. Marcille Blanco, RN

## 2017-06-20 NOTE — Progress Notes (Signed)
Patient unable to finish bowel prep. 200cc of prep remaining. Stool is liquid and black with no solid pieces.

## 2017-06-21 ENCOUNTER — Inpatient Hospital Stay (HOSPITAL_COMMUNITY): Payer: Medicare HMO | Admitting: Certified Registered"

## 2017-06-21 ENCOUNTER — Encounter (HOSPITAL_COMMUNITY)
Admission: EM | Disposition: A | Discharge status: Skilled Nursing Facility | Payer: Self-pay | Source: Home / Self Care | Attending: Internal Medicine

## 2017-06-21 ENCOUNTER — Encounter (HOSPITAL_COMMUNITY): Payer: Self-pay | Admitting: Anesthesiology

## 2017-06-21 HISTORY — PX: COLONOSCOPY WITH PROPOFOL: SHX5780

## 2017-06-21 HISTORY — PX: ESOPHAGOGASTRODUODENOSCOPY (EGD) WITH PROPOFOL: SHX5813

## 2017-06-21 LAB — BPAM FFP
BLOOD PRODUCT EXPIRATION DATE: 201903122359
ISSUE DATE / TIME: 201903082048
Unit Type and Rh: 5100

## 2017-06-21 LAB — CBC
HCT: 23.8 % — ABNORMAL LOW (ref 39.0–52.0)
Hemoglobin: 7.9 g/dL — ABNORMAL LOW (ref 13.0–17.0)
MCH: 28.9 pg (ref 26.0–34.0)
MCHC: 33.2 g/dL (ref 30.0–36.0)
MCV: 87.2 fL (ref 78.0–100.0)
PLATELETS: 159 10*3/uL (ref 150–400)
RBC: 2.73 MIL/uL — ABNORMAL LOW (ref 4.22–5.81)
RDW: 19.7 % — AB (ref 11.5–15.5)
WBC: 7.9 10*3/uL (ref 4.0–10.5)

## 2017-06-21 LAB — PREPARE FRESH FROZEN PLASMA: UNIT DIVISION: 0

## 2017-06-21 LAB — HEMOGLOBIN AND HEMATOCRIT, BLOOD
HEMATOCRIT: 27.2 % — AB (ref 39.0–52.0)
Hemoglobin: 9.1 g/dL — ABNORMAL LOW (ref 13.0–17.0)

## 2017-06-21 LAB — BASIC METABOLIC PANEL
ANION GAP: 9 (ref 5–15)
BUN: 25 mg/dL — AB (ref 6–20)
CALCIUM: 7.9 mg/dL — AB (ref 8.9–10.3)
CO2: 22 mmol/L (ref 22–32)
Chloride: 104 mmol/L (ref 101–111)
Creatinine, Ser: 1.29 mg/dL — ABNORMAL HIGH (ref 0.61–1.24)
GFR calc Af Amer: 58 mL/min — ABNORMAL LOW (ref >60)
GFR, EST NON AFRICAN AMERICAN: 50 mL/min — AB (ref >60)
GLUCOSE: 95 mg/dL (ref 65–99)
Potassium: 4.2 mmol/L (ref 3.5–5.1)
SODIUM: 135 mmol/L (ref 135–145)

## 2017-06-21 LAB — GLUCOSE, CAPILLARY
GLUCOSE-CAPILLARY: 78 mg/dL (ref 65–99)
GLUCOSE-CAPILLARY: 81 mg/dL (ref 65–99)
GLUCOSE-CAPILLARY: 80 mg/dL (ref 65–99)
GLUCOSE-CAPILLARY: 83 mg/dL (ref 65–99)
Glucose-Capillary: 82 mg/dL (ref 65–99)

## 2017-06-21 LAB — URINE CULTURE

## 2017-06-21 LAB — PREPARE RBC (CROSSMATCH)

## 2017-06-21 LAB — PROTIME-INR
INR: 2.08
PROTHROMBIN TIME: 23.2 s — AB (ref 11.4–15.2)

## 2017-06-21 SURGERY — ESOPHAGOGASTRODUODENOSCOPY (EGD) WITH PROPOFOL
Anesthesia: Monitor Anesthesia Care

## 2017-06-21 MED ORDER — PANTOPRAZOLE SODIUM 40 MG PO TBEC
40.0000 mg | DELAYED_RELEASE_TABLET | Freq: Every day | ORAL | Status: DC
Start: 2017-06-21 — End: 2017-06-23
  Administered 2017-06-21 – 2017-06-23 (×3): 40 mg via ORAL
  Filled 2017-06-21 (×3): qty 1

## 2017-06-21 MED ORDER — LIDOCAINE HCL (CARDIAC) 20 MG/ML IV SOLN
INTRAVENOUS | Status: DC | PRN
  Administered 2017-06-21: 30 mg via INTRAVENOUS

## 2017-06-21 MED ORDER — PROPOFOL 500 MG/50ML IV EMUL
INTRAVENOUS | Status: DC | PRN
  Administered 2017-06-21: 50 ug/kg/min via INTRAVENOUS

## 2017-06-21 MED ORDER — PHENYLEPHRINE HCL 10 MG/ML IJ SOLN
INTRAMUSCULAR | Status: DC | PRN
  Administered 2017-06-21 (×5): 80 ug via INTRAVENOUS

## 2017-06-21 MED ORDER — SODIUM CHLORIDE 0.9 % IV SOLN: Freq: Once | INTRAVENOUS | Status: DC

## 2017-06-21 MED ORDER — ALBUMIN HUMAN 5 % IV SOLN
INTRAVENOUS | Status: DC | PRN
  Administered 2017-06-21 (×2): via INTRAVENOUS

## 2017-06-21 MED ORDER — LACTATED RINGERS IV SOLN
INTRAVENOUS | Status: DC | PRN
  Administered 2017-06-21: 07:00:00 via INTRAVENOUS

## 2017-06-21 MED ORDER — PHENYLEPHRINE HCL 10 MG/ML IJ SOLN
INTRAMUSCULAR | Status: DC | PRN
  Administered 2017-06-21: 50 ug/min via INTRAVENOUS

## 2017-06-21 MED ORDER — LACTATED RINGERS IV SOLN
INTRAVENOUS | Status: DC
  Administered 2017-06-21: 1000 mL via INTRAVENOUS

## 2017-06-21 SURGICAL SUPPLY — 24 items

## 2017-06-21 NOTE — Progress Notes (Signed)
Recovery taking place in the procedure room and will take patient back to the floor directly.

## 2017-06-21 NOTE — Progress Notes (Signed)
[place] Nydam 7:38 AM  Subjective: Patient was out signs of any further bleeding and his hospital computer chart reviewed and he has no other GI complaints and his case discussed with my partner Dr. Michail Sermon  Objective: Vital signs stable afebrile no acute distress exam please see pre-assessment evaluation hemoglobin stable BUN decreased INR 2  Assessment: GI bleeding on a patient on blood thinners  Plan: Okay to proceed with colonoscopy and possible endoscopy today with anesthesia assistance  [facility]  Pager [phone] After 5PM or if no answer call [phone]

## 2017-06-21 NOTE — Progress Notes (Signed)
Patient transported to endoscopy. 

## 2017-06-21 NOTE — Op Note (Signed)
[facility] Patient Name: Brett [name] Procedure Date : 06/21/2017 MRN: [id number] Attending MD: Clarene Essex , MD Date of Birth: July 26, 1932 CSN: [id number] Age: 84 Admit Type: Inpatient Procedure:                Upper GI endoscopy Indications:              Melena Providers:                Clarene Essex, MD, Carolynn Comment RN, RN, Tinnie Gens, Technician Referring MD:              Medicines:                Propofol total dose 35 mg IV Complications:            Hypotension Estimated Blood Loss:     Estimated blood loss: none. Procedure:                Pre-Anesthesia Assessment:                           - Prior to the procedure, a History and Physical                            was performed, and patient medications and                            allergies were reviewed. The patient's tolerance of                            previous anesthesia was also reviewed. The risks                            and benefits of the procedure and the sedation                            options and risks were discussed with the patient.                            All questions were answered, and informed consent                            was obtained. Prior Anticoagulants: The patient has                            taken aspirin, last dose was 1 day prior to                            procedure. ASA Grade Assessment: III - A patient                            with severe systemic disease. After reviewing the  risks and benefits, the patient was deemed in                            satisfactory condition to undergo the procedure.                           After obtaining informed consent, the endoscope was                            passed under direct vision. Throughout the                            procedure, the patient's blood pressure, pulse, and                            oxygen saturations were monitored continuously. The                       [id number] ([id number]) scope was introduced through the                            mouth, and advanced to the third part of duodenum.                            The upper GI endoscopy was accomplished without                            difficulty. The patient tolerated the procedure                            well. Scope In: Scope Out: Findings:      A small hiatal hernia was present.      A few localized, small non-bleeding erosions were found in the gastric       antrum. There were no stigmata of recent bleeding.      A medium non-bleeding diverticulum was found in the second portion of       the duodenum.      The duodenal bulb and third portion of the duodenum were normal.      The exam was otherwise without abnormality. Impression:               - Small hiatal hernia.                           - Non-bleeding erosive gastropathy.                           - Non-bleeding duodenal diverticulum.                           - Normal duodenal bulb and third portion of the                            duodenum.                           -  The examination was otherwise normal.                           - No specimens collected. Moderate Sedation:      moderate sedation-none Recommendation:           - Patient has a contact number available for                            emergencies. The signs and symptoms of potential                            delayed complications were discussed with the                            patient. Return to normal activities tomorrow.                            Written discharge instructions were provided to the                            patient.                           - Soft diet today.                           - Continue present medications.                           - Return to GI clinic PRN.                           - Telephone GI clinic if symptomatic PRN.                           - Perform a colonoscopy today. Procedure Code(s):         --- Professional ---                           [phone], Esophagogastroduodenoscopy, flexible,                            transoral; diagnostic, including collection of                            specimen(s) by brushing or washing, when performed                            (separate procedure) Diagnosis Code(s):        --- Professional ---                           K44.9, Diaphragmatic hernia without obstruction or                            gangrene  K31.89, Other diseases of stomach and duodenum                           K92.1, Melena (includes Hematochezia)                           K57.10, Diverticulosis of small intestine without                            perforation or abscess without bleeding CPT copyright 2016 [employer] Association. All rights reserved. The codes documented in this report are preliminary and upon coder review may  be revised to meet current compliance requirements. Clarene Essex, MD 06/21/2017 8:26:18 AM This report has been signed electronically. Number of Addenda: 0

## 2017-06-21 NOTE — Anesthesia Postprocedure Evaluation (Signed)
Anesthesia Post Note  Patient: NIAL HAWE  Procedure(s) Performed: ESOPHAGOGASTRODUODENOSCOPY (EGD) WITH PROPOFOL (N/A ) COLONOSCOPY WITH PROPOFOL (N/A )     Patient location during evaluation: ICU Anesthesia Type: MAC Level of consciousness: awake and patient cooperative Pain management: pain level controlled Vital Signs Assessment: post-procedure vital signs reviewed and stable Respiratory status: spontaneous breathing, nonlabored ventilation, respiratory function stable and patient connected to nasal cannula oxygen Cardiovascular status: stable Postop Assessment: no apparent nausea or vomiting Anesthetic complications: no Comments: Neo gtt weaned and two units pRBCs given    Last Vitals:  Vitals:   06/21/17 1200 06/21/17 1215  BP: (!) 97/57 105/85  Pulse: 79 95  Resp: 19 18  Temp:  37 C  SpO2: 98% 98%    Last Pain:  Vitals:   06/21/17 1120  TempSrc: Oral  PainSc:                  Shalen Petrak

## 2017-06-21 NOTE — Transfer of Care (Signed)
Immediate Anesthesia Transfer of Care Note  Patient: Brett [name]  Procedure(s) Performed: ESOPHAGOGASTRODUODENOSCOPY (EGD) WITH PROPOFOL (N/A ) COLONOSCOPY WITH PROPOFOL (N/A )  Patient Location: Nursing Unit  Anesthesia Type:MAC  Level of Consciousness: awake, alert  and oriented  Airway & Oxygen Therapy: Patient Spontanous Breathing and Patient connected to nasal cannula oxygen  Post-op Assessment: Report given to RN and Post -op Vital signs reviewed and stable  Post vital signs: Reviewed and stable  Last Vitals:  Vitals:   06/21/17 0427 06/21/17 0702  BP: (!) 90/55 98/73  Pulse: 84 97  Resp: 16 20  Temp: (!) 36.3 C 36.7 C  SpO2: 99% 100%    Last Pain:  Vitals:   06/21/17 0702  TempSrc: Oral  PainSc:          Complications: No apparent anesthesia complications

## 2017-06-21 NOTE — Op Note (Signed)
[facility] Patient Name: Brett [name] Procedure Date : 06/21/2017 MRN: [id number] Attending MD: Clarene Essex , MD Date of Birth: November 15, 1932 CSN: [id number] Age: 84 Admit Type: Inpatient Procedure:                Colonoscopy Indications:              Melena Providers:                Clarene Essex, MD, Carolynn Comment RN, RN, Tinnie Gens, Technician Referring MD:              Medicines:                None hypotension throughout both procedures managed                            by anesthesia Complications:            No immediate complications. Estimated Blood Loss:     Estimated blood loss: none. Procedure:                Pre-Anesthesia Assessment:                           - Prior to the procedure, a History and Physical                            was performed, and patient medications and                            allergies were reviewed. The patient's tolerance of                            previous anesthesia was also reviewed. The risks                            and benefits of the procedure and the sedation                            options and risks were discussed with the patient.                            All questions were answered, and informed consent                            was obtained. Prior Anticoagulants: The patient has                            taken aspirin, last dose was 1 day prior to                            procedure. ASA Grade Assessment: III - A patient                            with severe  systemic disease. After reviewing the                            risks and benefits, the patient was deemed in                            satisfactory condition to undergo the procedure.                           - Prior to the procedure, a History and Physical                            was performed, and patient medications and                            allergies were reviewed. The patient's tolerance of              previous anesthesia was also reviewed. The risks                            and benefits of the procedure and the sedation                            options and risks were discussed with the patient.                            All questions were answered, and informed consent                            was obtained. Prior Anticoagulants: The patient has                            taken aspirin, last dose was day of procedure.his                            Coumadin has been stopped during his hospital stay                            and his INR was down to 2 ASA Grade Assessment: III                            - A patient with severe systemic disease. After                            reviewing the risks and benefits, the patient was                            deemed in satisfactory condition to undergo the                            procedure.                           After  obtaining informed consent, the colonoscope                            was passed under direct vision. Throughout the                            procedure, the patient's blood pressure, pulse, and                            oxygen saturations were monitored continuously. The                            EC-[id number] ([id number]) scope was introduced through                            the anus and advanced to the the cecum, identified                            by appendiceal orifice and ileocecal valve. The                            colonoscopy was performed without difficulty. The                            patient tolerated the procedure well. The ileocecal                            valve, appendiceal orifice, and rectum were                            photographed. The quality of the bowel preparation                            was fair. Scope In: 8:01:33 AM Scope Out: 8:16:51 AM Scope Withdrawal Time: 0 hours 8 minutes 6 seconds  Total Procedure Duration: 0 hours 15 minutes 18 seconds  Findings:       Scattered small and large-mouthed diverticula were found in the sigmoid       colon and distal descending colon.      A medium polyp was found in the distal sigmoid colon. The polyp was       semi-sessile.      A small polyp was found in the hepatic flexure. The polyp was       semi-sessile.      A moderate amount of stool was found in the rectum, making visualization       difficult. Lavage of the area was performed using a moderate amount of       sterile water, resulting in clearance with fair visualization.      The exam was otherwise without abnormality. Impression:               - Preparation of the colon was fair.                           - Diverticulosis in the sigmoid colon and in the  distal descending colon.                           - One medium polyp in the distal sigmoid colon.                           - One small polyp at the hepatic flexure.                           - Stool in the rectum.                           - The examination was otherwise normal.                           - No specimens collected. Moderate Sedation:      moderate sedation-none Recommendation:           - Patient has a contact number available for                            emergencies. The signs and symptoms of potential                            delayed complications were discussed with the                            patient. Return to normal activities tomorrow.                            Written discharge instructions were provided to the                            patient.                           - Soft diet today. pump inhibitors as long as                            continuing aspirin but would reevaluate whether he                            truly needs Coumadin or sho be on it                           - Continue present medications.                           - Return to GI clinic PRN. once stable and if doing                            well in 6 months  should probably undergo repeat                            colonoscopy with polypectomy                           -  Telephone GI clinic if symptomatic PRN.                           - Repeat colonoscopy in 6 months for surveillance                            based on pathology results. Procedure Code(s):        --- Professional ---                           [phone], Colonoscopy, flexible; diagnostic, including                            collection of specimen(s) by brushing or washing,                            when performed (separate procedure) Diagnosis Code(s):        --- Professional ---                           D12.5, Benign neoplasm of sigmoid colon                           D12.3, Benign neoplasm of transverse colon (hepatic                            flexure or splenic flexure)                           K92.1, Melena (includes Hematochezia)                           K57.30, Diverticulosis of large intestine without                            perforation or abscess without bleeding CPT copyright 2016 [employer]. All rights reserved. The codes documented in this report are preliminary and upon coder review may  be revised to meet current compliance requirements. Clarene Essex, MD 06/21/2017 8:33:47 AM This report has been signed electronically. Number of Addenda: 0

## 2017-06-21 NOTE — Progress Notes (Signed)
PT Cancellation Note  Patient Details Name: KIEN MIRSKY MRN: [id number] DOB: 04-20-1932   Cancelled Treatment:    Reason Eval/Treat Not Completed: Patient at procedure or test/unavailable. Pt in endoscopy. PT to re-attempt eval as time allows.    Lorriane Shire 06/21/2017, 8:14 AM   Lorrin Goodell, PT  Office # [phone] Pager [phone]

## 2017-06-21 NOTE — Progress Notes (Signed)
CRNA to stay with patient in [place] until neo drip is d/c'ed. Patient is alert and oriented. Fiance at bedside. Charge RN is aware of blood pressure concerns during procedure.

## 2017-06-21 NOTE — Anesthesia Preprocedure Evaluation (Signed)
Anesthesia Evaluation  Patient identified by MRN, date of birth, ID band Patient awake    Reviewed: Allergy & Precautions, NPO status , Patient's Chart, lab work & pertinent test results  History of Anesthesia Complications Negative for: history of anesthetic complications  Airway Mallampati: II  TM Distance: >3 FB Neck ROM: Full    Dental  (+) Edentulous Upper, Edentulous Lower   Pulmonary neg shortness of breath, neg sleep apnea, neg COPD, neg recent URI, former smoker,    breath sounds clear to auscultation       Cardiovascular hypertension, + CAD and +CHF  + dysrhythmias Atrial Fibrillation  Rhythm:Regular     Neuro/Psych  Neuromuscular disease negative psych ROS   GI/Hepatic Neg liver ROS, ? GI bleed    Endo/Other  diabetes, Type 2Hypothyroidism   Renal/GU CRFRenal disease     Musculoskeletal   Abdominal   Peds  Hematology  (+) anemia ,   Anesthesia Other Findings EF 50, mod AI, pulm htn  Reproductive/Obstetrics                             Anesthesia Physical Anesthesia Plan  ASA: III  Anesthesia Plan: MAC   Post-op Pain Management:    Induction:   PONV Risk Score and Plan: 1 and Treatment may vary due to age or medical condition  Airway Management Planned: Nasal Cannula  Additional Equipment:   Intra-op Plan:   Post-operative Plan:   Informed Consent: I have reviewed the patients History and Physical, chart, labs and discussed the procedure including the risks, benefits and alternatives for the proposed anesthesia with the patient or authorized representative who has indicated his/her understanding and acceptance.   Dental advisory given  Plan Discussed with: CRNA and Surgeon  Anesthesia Plan Comments:         Anesthesia Quick Evaluation

## 2017-06-21 NOTE — Progress Notes (Signed)
PROGRESS NOTE    Brett [name]  JIR:[id number] DOB: 1933/03/28 DOA: 06/18/2017 PCP: [name], MD   Brief Narrative:  HPI on 06/18/2017 by Dr. [name] [name] is a 84 y.o. male with history of atrial fibrillation on chronic anticoagulation with Coumadin, chronic kidney disease stage III with a baseline creatinine around 1.8, hypertension, hypothyroidism and type 2 diabetes who comes to the hospital today after EMS was called due to hypotension and weakness.  Reportedly blood pressure was initially systolic of 60 and improved to 90 prior to transport.  Of note patient has had 2 ED visits in February for weakness.  At one point his metoprolol dose was reduced due to a first-degree AV block and his weakness.  Patient tells me that in addition to fatigue he has been having dizziness upon standing and has fallen a few times causing significant skin tears to his right forearm.  Has never lost consciousness, has not hit his head.  He has also noticed dark stools, however has been on an iron supplement.  He was found to be orthostatic in the ED with systolic blood pressure dropping from the 90s-70s and pulse rate increasing from 80s to 100s while going from sitting to standing.,  Labs are significant for hemoglobin of 7.0 with an MCV of 90.4, troponin of less than 0.03, creatinine at baseline of 1.86.  CT scan of the head is negative, chest x-ray without acute cardiopulmonary process.  Admission is requested.  Of note, there have apparently been several difficult encounters with patient's family.  Family is insistent on transfer to [facility], has apparently been aggressive and combative with staff to the point where security needed to be notified.  When I assessed the patient no family was at bedside and patient was calm.  Interim history Admitted for symptomatic anemia.  Patient continues to have black stools overnight, however was given colon prep.  Patient's hemoglobin did drop again  this morning to 6.9.  Transfusing additional units of blood and will continue to monitor INR.  Will likely give FFP. Assessment & Plan   Symptomatic anemia/Acute on chronic anemia -Hemoglobin on admission was 7, baseline appears to be 9-11 -Transfused 4u PRBC thus far -Patient given 2 doses of oral Vit K 5mg  and FFP -INR down to 2.08 -hemoglobin currently 7.9 -Gastroenterology consulted and appreciated. -Status post EGD showing small hiatal hernia, nonbleeding erosive gastropathy and duodenal diverticulum. -Status post colonoscopy showing diverticulosis in the sigmoid colon and distal descending colon.  One medium polyp distal sigmoid colon, one small polyp hepatic flexure. -Gastroenterology recommended soft diet, PPI as long as patient is on aspirin, would reevaluate whether patient needs to be on Coumadin.  Follow-up with GI clinic as needed.  Patient should undergo repeat colonoscopy with polypectomy pending pathology results. -During EGD and colonoscopy, patient was noted to be somewhat hypotensive and was placed on Neo-Synephrine.  Currently blood pressure has improved.  2 additional units of packed red blood cells have been ordered for transfusion to aid with the hypotension. -Will continue to monitor CBC -Had long discussion with patient regarding the need for continued Coumadin therapy.  I recommended for him to hold this medication and discuss the necessity of it with his primary care physician upon discharge.  Generalized weakness -Likely secondary to symptomatic anemia -Vitamin B12: 553 -Will consult physical therapy to begin on 06/22/2017 given the patient is currently receiving blood transfusions and is mildly hypotensive  Orthostasis -Suspect secondary to anemia and decreased  oral intake -BP currently soft, see discussion above -Will order repeat orthostatic vitals after transfusion and patient stabilized   Hypothyroidism -Continue Synthroid  Chronic diastolic heart  failure -Patient currently appears to be euvolemic and compensated -Echocardiogram August 20 18 showed an EF of 50-55% -monitor Intake and output, daily weights  Diabetes mellitus, type II -Hemoglobin A1c 4.7  -Continue insulin sliding scale and CBG monitoring  Chronic atrial fibrillation -Coumadin currently on hold -INR was 4.7 on admission, currently down to 2.08, after receiving 2 doses of oral vitamin K 5 mg and FFP -See discussion above -HR currently controlled, continue amiodarone  Chronic kidney disease, stage III -Appears to be stable and at baseline. -Continue to monitor BMP  DVT Prophylaxis  SCDs  Code Status: Full  Family Communication: Friend/fianc at bedside  Disposition Plan: Admitted.  Continue to monitor in stepdown given hypotension.  Suspect home within 24-48 hours.  Consultants Gastroenterology  Procedures  Colonoscopy EGD  Antibiotics   Anti-infectives (From admission, onward)   None      Subjective:   [name] seen and examined today.  Currently very sleepy after procedures. Denies current chest pain, abdominal pain.  Had episode of vomiting this morning. Feels breathing is at baseline.  Objective:   Vitals:   06/21/17 0959 06/21/17 1015 06/21/17 1120 06/21/17 1215  BP:  (!) 87/75 (!) 108/59 105/85  Pulse:  87 93 95  Resp: 15 17 (!) 25 18  Temp:  97.8 F (36.6 C) 99.6 F (37.6 C) 98.6 F (37 C)  TempSrc:   Oral   SpO2:  100% 100% 98%  Weight:      Height:        Intake/Output Summary (Last 24 hours) at 06/21/2017 1239 Last data filed at 06/21/2017 1000 Gross per 24 hour  Intake 2312 ml  Output 775 ml  Net 1537 ml   Filed Weights   06/19/17 0451 06/20/17 0503 06/21/17 0702  Weight: 99.8 kg (220 lb) 89.2 kg (196 lb 9.6 oz) 88.9 kg (196 lb)   Exam  General: Well developed, well nourished, NAD, appears stated age  45: NCAT, mucous membranes moist.   Neck: Supple, no JVD, no masses  Cardiovascular: S1 S2 auscultated,  no murmur  Respiratory: Clear to auscultation bilaterally with equal chest rise  Abdomen: Soft, nontender, nondistended, + bowel sounds  Extremities: warm dry without cyanosis clubbing or edema  Neuro: AAOx3, nonfocal  Psych: Appropriate mood and affect  Data Reviewed: I have personally reviewed following labs and imaging studies  CBC: Recent Labs  Lab 06/18/17 1142 06/19/17 0702 06/20/17 0648 06/21/17 0157  WBC 5.2  --  5.5 7.9  NEUTROABS 2.7  --   --   --   HGB 7.0* 8.4* 6.9* 7.9*  HCT 21.6* 25.6* 20.8* 23.8*  MCV 90.4  --  90.0 87.2  PLT 201  --  186 973   Basic Metabolic Panel: Recent Labs  Lab 06/18/17 1142 06/20/17 0648 06/21/17 0157  NA 132* 134* 135  K 4.0 4.0 4.2  CL 99* 106 104  CO2 22 21* 22  GLUCOSE 108* 83 95  BUN 36* 33* 25*  CREATININE 1.86* 1.37* 1.29*  CALCIUM 8.3* 7.7* 7.9*   GFR: Estimated Creatinine Clearance: 48.5 mL/min (A) (by C-G formula based on SCr of 1.29 mg/dL (H)). Liver Function Tests: Recent Labs  Lab 06/18/17 1142  AST 56*  ALT 32  ALKPHOS 82  BILITOT 0.8  PROT 5.6*  ALBUMIN 3.1*   Recent Labs  Lab 06/18/17 1142  LIPASE 35   No results for input(s): AMMONIA in the last 168 hours. Coagulation Profile: Recent Labs  Lab 06/19/17 1245 06/19/17 2335 06/20/17 0727 06/20/17 1515 06/21/17 0157  INR 3.71 3.86 3.97 3.23 2.08   Cardiac Enzymes: Recent Labs  Lab 06/18/17 1142  TROPONINI <0.03   BNP (last 3 results) No results for input(s): PROBNP in the last 8760 hours. HbA1C: No results for input(s): HGBA1C in the last 72 hours. CBG: Recent Labs  Lab 06/20/17 1524 06/20/17 2213 06/21/17 0715 06/21/17 0856 06/21/17 1140  GLUCAP 114* 111* 78 81 80   Lipid Profile: No results for input(s): CHOL, HDL, LDLCALC, TRIG, CHOLHDL, LDLDIRECT in the last 72 hours. Thyroid Function Tests: No results for input(s): TSH, T4TOTAL, FREET4, T3FREE, THYROIDAB in the last 72 hours. Anemia Panel: No results for input(s):  VITAMINB12, FOLATE, FERRITIN, TIBC, IRON, RETICCTPCT in the last 72 hours. Urine analysis:    Component Value Date/Time   COLORURINE YELLOW 06/20/2017 1636   APPEARANCEUR HAZY (A) 06/20/2017 1636   LABSPEC 1.010 06/20/2017 1636   PHURINE 5.0 06/20/2017 1636   GLUCOSEU NEGATIVE 06/20/2017 1636   HGBUR NEGATIVE 06/20/2017 1636   BILIRUBINUR NEGATIVE 06/20/2017 1636   KETONESUR NEGATIVE 06/20/2017 1636   PROTEINUR NEGATIVE 06/20/2017 1636   UROBILINOGEN 0.2 12/01/2013 0740   NITRITE NEGATIVE 06/20/2017 1636   LEUKOCYTESUR NEGATIVE 06/20/2017 1636   Sepsis Labs: @LABRCNTIP (procalcitonin:4,lacticidven:4)  ) Recent Results (from the past 240 hour(s))  MRSA PCR Screening     Status: None   Collection Time: 06/20/17  4:33 PM  Result Value Ref Range Status   MRSA by PCR NEGATIVE NEGATIVE Final    Comment:        The GeneXpert MRSA Assay (FDA approved for NASAL specimens only), is one component of a comprehensive MRSA colonization surveillance program. It is not intended to diagnose MRSA infection nor to guide or monitor treatment for MRSA infections. Performed at [facility], [place] [address] [place], [place] [address]       Radiology Studies: No results found.   Scheduled Meds: . amiodarone  200 mg Oral Daily  . B-complex with vitamin C  1 tablet Oral Daily  . ferrous sulfate  325 mg Oral Daily  . insulin aspart  0-5 Units Subcutaneous QHS  . insulin aspart  0-9 Units Subcutaneous TID WC  . insulin aspart  3 Units Subcutaneous TID WC  . levothyroxine  50 mcg Oral BH-q7a  . pantoprazole  40 mg Oral Daily  . polyethylene glycol powder  1 Container Oral Once   Continuous Infusions: . sodium chloride 10 mL/hr (06/21/17 1000)  . sodium chloride       LOS: 3 days   Time Spent in minutes   45 minutes  Xsavier Seeley D.O. on 06/21/2017 at 12:39 PM  Between 7am to 7pm - Pager - [phone]  After 7pm go to [email or web address] - password [email or web address]  And look for the  night coverage person covering for me after hours  [facility] Office  [phone]

## 2017-06-22 LAB — TYPE AND SCREEN
ABO/RH(D): O POS
Antibody Screen: NEGATIVE
UNIT DIVISION: 0
UNIT DIVISION: 0
Unit division: 0
Unit division: 0
Unit division: 0
Unit division: 0
Unit division: 0
Unit division: 0

## 2017-06-22 LAB — BPAM RBC
BLOOD PRODUCT EXPIRATION DATE: 201903272359
BLOOD PRODUCT EXPIRATION DATE: 201904012359
BLOOD PRODUCT EXPIRATION DATE: 201904022359
BLOOD PRODUCT EXPIRATION DATE: 201904022359
Blood Product Expiration Date: 201903152359
Blood Product Expiration Date: 201903152359
Blood Product Expiration Date: 201903282359
Blood Product Expiration Date: 201904012359
ISSUE DATE / TIME: 201903070235
ISSUE DATE / TIME: 201903081100
ISSUE DATE / TIME: 201903091024
ISSUE DATE / TIME: 201903062358
ISSUE DATE / TIME: 201903081519
ISSUE DATE / TIME: 201903090814
ISSUE DATE / TIME: 201903090944
ISSUE DATE / TIME: 201903091106
UNIT TYPE AND RH: 5100
UNIT TYPE AND RH: 5100
UNIT TYPE AND RH: 5100
Unit Type and Rh: 5100
Unit Type and Rh: 5100
Unit Type and Rh: 5100
Unit Type and Rh: 5100
Unit Type and Rh: 5100

## 2017-06-22 LAB — GLUCOSE, CAPILLARY
GLUCOSE-CAPILLARY: 40 mg/dL — AB (ref 65–99)
GLUCOSE-CAPILLARY: 96 mg/dL (ref 65–99)
Glucose-Capillary: 64 mg/dL — ABNORMAL LOW (ref 65–99)
Glucose-Capillary: 125 mg/dL — ABNORMAL HIGH (ref 65–99)
Glucose-Capillary: 101 mg/dL — ABNORMAL HIGH (ref 65–99)

## 2017-06-22 LAB — CBC
HCT: 26.7 % — ABNORMAL LOW (ref 39.0–52.0)
Hemoglobin: 9.1 g/dL — ABNORMAL LOW (ref 13.0–17.0)
MCH: 30.3 pg (ref 26.0–34.0)
MCHC: 34.1 g/dL (ref 30.0–36.0)
MCV: 89 fL (ref 78.0–100.0)
PLATELETS: 131 10*3/uL — AB (ref 150–400)
RBC: 3 MIL/uL — ABNORMAL LOW (ref 4.22–5.81)
RDW: 19.1 % — AB (ref 11.5–15.5)
WBC: 6 10*3/uL (ref 4.0–10.5)

## 2017-06-22 LAB — BASIC METABOLIC PANEL
ANION GAP: 11 (ref 5–15)
BUN: 22 mg/dL — ABNORMAL HIGH (ref 6–20)
CO2: 17 mmol/L — ABNORMAL LOW (ref 22–32)
Calcium: 8.3 mg/dL — ABNORMAL LOW (ref 8.9–10.3)
Chloride: 105 mmol/L (ref 101–111)
Creatinine, Ser: 1.24 mg/dL (ref 0.61–1.24)
GFR calc Af Amer: >60 mL/min (ref >60)
GFR, EST NON AFRICAN AMERICAN: 52 mL/min — AB (ref >60)
GLUCOSE: 72 mg/dL (ref 65–99)
POTASSIUM: 4.2 mmol/L (ref 3.5–5.1)
SODIUM: 133 mmol/L — AB (ref 135–145)

## 2017-06-22 MED ORDER — TRAMADOL HCL 50 MG PO TABS
50.0000 mg | ORAL_TABLET | Freq: Four times a day (QID) | ORAL | Status: DC | PRN
  Administered 2017-06-22 (×3): 50 mg via ORAL
  Filled 2017-06-22 (×3): qty 1

## 2017-06-22 MED ORDER — DEXTROSE 50 % IV SOLN
INTRAVENOUS | Status: AC
  Administered 2017-06-22: 16:00:00
  Filled 2017-06-22: qty 50

## 2017-06-22 MED ORDER — DEXTROSE 50 % IV SOLN
INTRAVENOUS | Status: AC
  Administered 2017-06-22: 09:00:00
  Filled 2017-06-22: qty 50

## 2017-06-22 NOTE — Evaluation (Signed)
Physical Therapy Evaluation Patient Details Name: Brett [name] MRN: [id number] DOB: [birth year] Today's Date: 06/22/2017   History of Present Illness  Brett [name] is a 84 y.o. male with history of atrial fibrillation on chronic anticoagulation with Coumadin, chronic kidney disease stage III with a baseline creatinine around 1.8, hypertension, hypothyroidism and type 2 diabetes who comes to the hospital today after EMS was called due to hypotension and weakness; anemia, has had blood transfusions, EGD on 3/9  Clinical Impression   Pt admitted with above diagnosis. Pt currently with functional limitations due to the deficits listed below (see PT Problem List). Presents with decr activity tolerance, orthostatic hypotension, generalized weakness; Rec SNF for rehab to maximize independence and safety with mobility prior to dc home;  Pt will benefit from skilled PT to increase their independence and safety with mobility to allow discharge to the venue listed below.    Worth considering TED hose to help with postural hypotension;   Also, consider imaging L hip -- pain increased with weight bearing; I suspect arthritis, opted not to perform grind test due to pain;      Follow Up Recommendations SNF    Equipment Recommendations  Rolling walker with 5" wheels;3in1 (PT)    Recommendations for Other Services       Precautions / Restrictions Precautions Precautions: Fall Precaution Comments: Orthostatic hypotension      Mobility  Bed Mobility Overal bed mobility: Needs Assistance Bed Mobility: Supine to Sit     Supine to sit: Min assist     General bed mobility comments: Min handheld assist to pull to sit  Transfers Overall transfer level: Needs assistance Equipment used: Rolling walker (2 wheeled) Transfers: Sit to/from Stand Sit to Stand: Min assist         General transfer comment: Min assist to rise; cues for hand placment  Ambulation/Gait Ambulation/Gait  assistance: Min guard Ambulation Distance (Feet): (pivotal steps bed to chair) Assistive device: Rolling walker (2 wheeled) Gait Pattern/deviations: Shuffle     General Gait Details: Cues to self-monitor for activity tolerance; Cues also to support self on RW to [employer] painful L hip  Stairs            Wheelchair Mobility    Modified Rankin (Stroke Patients Only)       Balance                                             Pertinent Vitals/Pain Pain Assessment: Faces Faces Pain Scale: Hurts whole lot Pain Location: L hip pain with weight bearing Pain Descriptors / Indicators: Aching Pain Intervention(s): Monitored during session;Premedicated before session    Home Living Family/patient expects to be discharged to:: Private residence Living Arrangements: Spouse/significant other Available Help at Discharge: Family;Available PRN/intermittently(close to 24 hour) Type of Home: House Home Access: Stairs to enter Entrance Stairs-Rails: (to be determined) Entrance Stairs-Number of Steps: 3 Home Layout: One level Home Equipment: Walker - 2 wheels Additional Comments: Home situation needs to be verified    Prior Function Level of Independence: Independent with assistive device(s)         Comments: RW for amb     Hand Dominance        Extremity/Trunk Assessment   Upper Extremity Assessment Upper Extremity Assessment: Generalized weakness    Lower Extremity Assessment Lower Extremity Assessment: Generalized weakness  Communication   Communication: No difficulties  Cognition Arousal/Alertness: Awake/alert Behavior During Therapy: WFL for tasks assessed/performed Overall Cognitive Status: Within Functional Limits for tasks assessed                                        General Comments General comments (skin integrity, edema, etc.):   06/22/17 1030  Vital Signs  Patient Position (if appropriate) Orthostatic  Vitals  Orthostatic Lying   BP- Lying 94/51  Pulse- Lying 79  Orthostatic Sitting  BP- Sitting 95/54  Pulse- Sitting 86  Orthostatic Standing at 0 minutes  BP- Standing at 0 minutes (!) 89/47  Pulse- Standing at 0 minutes 87  Orthostatic Standing at 3 minutes  BP- Standing at 3 minutes (!) 72/52  Pulse- Standing at 3 minutes 90       Exercises     Assessment/Plan    PT Assessment Patient needs continued PT services  PT Problem List Decreased strength;Decreased activity tolerance;Decreased balance;Decreased mobility;Decreased coordination;Decreased knowledge of use of DME;Decreased knowledge of precautions;Cardiopulmonary status limiting activity;Pain       PT Treatment Interventions DME instruction;Gait training;Stair training;Functional mobility training;Therapeutic activities;Therapeutic exercise;Balance training;Patient/family education    PT Goals (Current goals can be found in the Care Plan section)  Acute Rehab PT Goals Patient Stated Goal: less pain and move better PT Goal Formulation: With patient Time For Goal Achievement: 07/06/17 Potential to Achieve Goals: Good    Frequency Min 2X/week   Barriers to discharge        Co-evaluation               AM-PAC PT "6 Clicks" Daily Activity  Outcome Measure Difficulty turning over in bed (including adjusting bedclothes, sheets and blankets)?: A Little Difficulty moving from lying on back to sitting on the side of the bed? : A Little Difficulty sitting down on and standing up from a chair with arms (e.g., wheelchair, bedside commode, etc,.)?: Unable Help needed moving to and from a bed to chair (including a wheelchair)?: A Little Help needed walking in hospital room?: A Lot Help needed climbing 3-5 steps with a railing? : Total 6 Click Score: 13    End of Session Equipment Utilized During Treatment: Gait belt Activity Tolerance: Patient tolerated treatment well Patient left: in chair;with call bell/phone  within reach;with chair alarm set Nurse Communication: Mobility status PT Visit Diagnosis: Unsteadiness on feet (R26.81);Other abnormalities of gait and mobility (R26.89);Repeated falls (R29.6);Muscle weakness (generalized) (M62.81);Difficulty in walking, not elsewhere classified (R26.2)    Time: 9629-5284 PT Time Calculation (min) (ACUTE ONLY): 32 min   Charges:   PT Evaluation $PT Eval Moderate Complexity: 1 Mod PT Treatments $Therapeutic Activity: 8-22 mins   PT G Codes:        Roney Marion, PT  Acute Rehabilitation Services Pager [phone] Office [phone]   Colletta Maryland 06/22/2017, 3:16 PM

## 2017-06-22 NOTE — Progress Notes (Signed)
[place] 9:56 AM  Subjective: Patient without procedural problems and no GI complaints and his main complaint is his left leg and he has no further signs of bleeding and his case discussed with the hospital team yesterday tolerating clear liquids  Objective: Vital signs stable afebrile abdomen is soft nontender hemoglobin stable BUN and creatinine stable  Assessment: Multiple medical problems including resolved GI bleeding on blood thinners  Plan: If patient continues to have signs of bleeding will need probable capsule endoscopy next or a repeat CT since last one was done last summer but otherwise can advance diet now and reevaluate blood thinner needs as outpatient and happy to see back when necessary  [facility]  Pager [phone] After 5PM or if no answer call [phone]

## 2017-06-22 NOTE — Progress Notes (Addendum)
PROGRESS NOTE    Brett [name]  CXK:[id number] DOB: May 27, 1932 DOA: 06/18/2017 PCP: Brett Gravel, MD   Brief Narrative:  HPI on 06/18/2017 by Dr. [name] Brett [name] is a 84 y.o. male with history of atrial fibrillation on chronic anticoagulation with Coumadin, chronic kidney disease stage III with a baseline creatinine around 1.8, hypertension, hypothyroidism and type 2 diabetes who comes to the hospital today after EMS was called due to hypotension and weakness.  Reportedly blood pressure was initially systolic of 60 and improved to 90 prior to transport.  Of note patient has had 2 ED visits in February for weakness.  At one point his metoprolol dose was reduced due to a first-degree AV block and his weakness.  Patient tells me that in addition to fatigue he has been having dizziness upon standing and has fallen a few times causing significant skin tears to his right forearm.  Has never lost consciousness, has not hit his head.  He has also noticed dark stools, however has been on an iron supplement.  He was found to be orthostatic in the ED with systolic blood pressure dropping from the 90s-70s and pulse rate increasing from 80s to 100s while going from sitting to standing.,  Labs are significant for hemoglobin of 7.0 with an MCV of 90.4, troponin of less than 0.03, creatinine at baseline of 1.86.  CT scan of the head is negative, chest x-ray without acute cardiopulmonary process.  Admission is requested.  Of note, there have apparently been several difficult encounters with patient's family.  Family is insistent on transfer to [facility], has apparently been aggressive and combative with staff to the point where security needed to be notified.  When I assessed the patient no family was at bedside and patient was calm.  Interim history Admitted for symptomatic anemia.  Patient continues to have black stools overnight, however was given colon prep.  Patient's hemoglobin did drop again  this morning to 6.9.  Transfused and given FFP. S/p EGD/colonoscopy.   Assessment & Plan   Symptomatic anemia/Acute on chronic anemia -Hemoglobin on admission was 7, baseline appears to be 9-11 -Transfused 6u PRBC thus far -Patient given 2 doses of oral Vit K 5mg  and FFP -INR down to 2.08 -hemoglobin currently 9.1 -Gastroenterology consulted and appreciated. -Status post EGD showing small hiatal hernia, nonbleeding erosive gastropathy and duodenal diverticulum. -Status post colonoscopy showing diverticulosis in the sigmoid colon and distal descending colon.  One medium polyp distal sigmoid colon, one small polyp hepatic flexure. -Gastroenterology recommended soft diet, PPI as long as patient is on aspirin, would reevaluate whether patient needs to be on Coumadin.  Follow-up with GI clinic as needed.  Patient should undergo repeat colonoscopy with polypectomy pending pathology results. -During EGD and colonoscopy, patient was noted to be somewhat hypotensive and was placed on Neo-Synephrine.  Currently blood pressure has improved.  2 additional units of packed red blood cells have been ordered for transfusion to aid with the hypotension. -Will continue to monitor CBC -Had long discussion with patient regarding the need for continued Coumadin therapy.  I recommended for him to hold this medication and discuss the necessity of it with his primary care physician upon discharge.  Generalized weakness -Likely secondary to symptomatic anemia -Vitamin B12: 553 -Will consult physical therapy to begin on 06/22/2017 given the patient is currently receiving blood transfusions and is mildly hypotensive  Orthostasis -Suspect secondary to anemia and decreased oral intake -BP currently soft, see discussion above  Hypothyroidism -Continue Synthroid  Chronic diastolic heart failure -Patient currently appears to be euvolemic and compensated -Echocardiogram August 20 18 showed an EF of 50-55% -monitor  Intake and output, daily weights  Diabetes mellitus, type II -Hemoglobin A1c 4.7  -Continue insulin sliding scale and CBG monitoring  Chronic atrial fibrillation -Coumadin currently on hold -INR was 4.7 on admission, currently down to 2.08, after receiving 2 doses of oral vitamin K 5 mg and FFP -See discussion above -HR currently controlled, continue amiodarone  Chronic kidney disease, stage III -Appears to be stable and at baseline. -Continue to monitor BMP  Left hip pain -suspect secondary to laying in bed  -will consult PT  -will order tramadol, tylenol with minimal relief, avoiding NSAIDs due to bleeding/anemia  DVT Prophylaxis  SCDs  Code Status: Full  Family Communication: none at bedside  Disposition Plan: Admitted.  Continue to monitor in stepdown given hypotension.  Will consult PT/OT  Consultants Gastroenterology  Procedures  Colonoscopy EGD  Antibiotics   Anti-infectives (From admission, onward)   None      Subjective:   Brett [name] seen and examined today.  Complains of left hip pain. Denies current chest pain. States his breathing is at baseline. Denies abdominal pain, nausea, vomiting, diarrhea, constipation. Still feels weak.   Objective:   Vitals:   06/22/17 0732 06/22/17 0800 06/22/17 1046 06/22/17 1100  BP: 98/62 (!) 95/59 (!) 107/58 97/63  Pulse: 80 74 80 72  Resp: 18 17  17   Temp: 98.4 F (36.9 C)   97.7 F (36.5 C)  TempSrc: Oral   Oral  SpO2: 99% 99%  96%  Weight:      Height:        Intake/Output Summary (Last 24 hours) at 06/22/2017 1200 Last data filed at 06/22/2017 6295 Gross per 24 hour  Intake 90 ml  Output 300 ml  Net -210 ml   Filed Weights   06/19/17 0451 06/20/17 0503 06/21/17 0702  Weight: 99.8 kg (220 lb) 89.2 kg (196 lb 9.6 oz) 88.9 kg (196 lb)   Exam  General: Well developed, well nourished, NAD, appears stated age  26: NCAT, mucous membranes moist.   Neck: Supple  Cardiovascular: S1 S2  auscultated, no murmur, irregular  Respiratory: Diminished, however clear  Abdomen: Soft, nontender, nondistended, + bowel sounds  Extremities: warm dry without cyanosis clubbing or edema  Neuro: AAOx3, nonfocal  Psych: Appropriate mood and affect  Data Reviewed: I have personally reviewed following labs and imaging studies  CBC: Recent Labs  Lab 06/18/17 1142 06/19/17 0702 06/20/17 0648 06/21/17 0157 06/21/17 1430 06/22/17 0158  WBC 5.2  --  5.5 7.9  --  6.0  NEUTROABS 2.7  --   --   --   --   --   HGB 7.0* 8.4* 6.9* 7.9* 9.1* 9.1*  HCT 21.6* 25.6* 20.8* 23.8* 27.2* 26.7*  MCV 90.4  --  90.0 87.2  --  89.0  PLT 201  --  186 159  --  284*   Basic Metabolic Panel: Recent Labs  Lab 06/18/17 1142 06/20/17 0648 06/21/17 0157 06/22/17 0158  NA 132* 134* 135 133*  K 4.0 4.0 4.2 4.2  CL 99* 106 104 105  CO2 22 21* 22 17*  GLUCOSE 108* 83 95 72  BUN 36* 33* 25* 22*  CREATININE 1.86* 1.37* 1.29* 1.24  CALCIUM 8.3* 7.7* 7.9* 8.3*   GFR: Estimated Creatinine Clearance: 50.4 mL/min (by C-G formula based on SCr of 1.24 mg/dL). Liver Function  Tests: Recent Labs  Lab 06/18/17 1142  AST 56*  ALT 32  ALKPHOS 82  BILITOT 0.8  PROT 5.6*  ALBUMIN 3.1*   Recent Labs  Lab 06/18/17 1142  LIPASE 35   No results for input(s): AMMONIA in the last 168 hours. Coagulation Profile: Recent Labs  Lab 06/19/17 1245 06/19/17 2335 06/20/17 0727 06/20/17 1515 06/21/17 0157  INR 3.71 3.86 3.97 3.23 2.08   Cardiac Enzymes: Recent Labs  Lab 06/18/17 1142  TROPONINI <0.03   BNP (last 3 results) No results for input(s): PROBNP in the last 8760 hours. HbA1C: No results for input(s): HGBA1C in the last 72 hours. CBG: Recent Labs  Lab 06/21/17 1140 06/21/17 1614 06/21/17 2136 06/22/17 0749 06/22/17 0924  GLUCAP 80 83 82 64* 125*   Lipid Profile: No results for input(s): CHOL, HDL, LDLCALC, TRIG, CHOLHDL, LDLDIRECT in the last 72 hours. Thyroid Function Tests: No  results for input(s): TSH, T4TOTAL, FREET4, T3FREE, THYROIDAB in the last 72 hours. Anemia Panel: No results for input(s): VITAMINB12, FOLATE, FERRITIN, TIBC, IRON, RETICCTPCT in the last 72 hours. Urine analysis:    Component Value Date/Time   COLORURINE YELLOW 06/20/2017 1636   APPEARANCEUR HAZY (A) 06/20/2017 1636   LABSPEC 1.010 06/20/2017 1636   PHURINE 5.0 06/20/2017 1636   GLUCOSEU NEGATIVE 06/20/2017 1636   HGBUR NEGATIVE 06/20/2017 1636   BILIRUBINUR NEGATIVE 06/20/2017 1636   KETONESUR NEGATIVE 06/20/2017 1636   PROTEINUR NEGATIVE 06/20/2017 1636   UROBILINOGEN 0.2 12/01/2013 0740   NITRITE NEGATIVE 06/20/2017 1636   LEUKOCYTESUR NEGATIVE 06/20/2017 1636   Sepsis Labs: @LABRCNTIP (procalcitonin:4,lacticidven:4)  ) Recent Results (from the past 240 hour(s))  MRSA PCR Screening     Status: None   Collection Time: 06/20/17  4:33 PM  Result Value Ref Range Status   MRSA by PCR NEGATIVE NEGATIVE Final    Comment:        The GeneXpert MRSA Assay (FDA approved for NASAL specimens only), is one component of a comprehensive MRSA colonization surveillance program. It is not intended to diagnose MRSA infection nor to guide or monitor treatment for MRSA infections. Performed at [facility], [place] [address] [place], [place] [address]   Urine Culture     Status: Abnormal   Collection Time: 06/20/17  4:36 PM  Result Value Ref Range Status   Specimen Description URINE, CLEAN CATCH  Final   Special Requests NONE  Final   Culture (A)  Final    <10,000 COLONIES/mL Performed at [facility], [place] [address] [place], [place] [address]    Report Status 06/21/2017 FINAL  Final      Radiology Studies: No results found.   Scheduled Meds: . amiodarone  200 mg Oral Daily  . B-complex with vitamin C  1 tablet Oral Daily  . ferrous sulfate  325 mg Oral Daily  . insulin aspart  0-5 Units Subcutaneous QHS  . insulin aspart  0-9 Units Subcutaneous TID WC  .  insulin aspart  3 Units Subcutaneous TID WC  . levothyroxine  50 mcg Oral BH-[id number]  . pantoprazole  40 mg Oral Daily  . polyethylene glycol powder  1 Container Oral Once   Continuous Infusions: . sodium chloride 10 mL/hr (06/21/17 1600)  . sodium chloride       LOS: 4 days   Time Spent in minutes   30 minutes  Madiline Saffran D.O. on 06/22/2017 at 12:00 PM  Between 7am to 7pm - Pager - [phone]  After 7pm  go to [email or web address] - password [email or web address]  And look for the night coverage person covering for me after hours  [facility] Office  [phone]

## 2017-06-23 DIAGNOSIS — J439 Emphysema, unspecified: Secondary | ICD-10-CM | POA: Diagnosis not present

## 2017-06-23 DIAGNOSIS — E039 Hypothyroidism, unspecified: Secondary | ICD-10-CM | POA: Diagnosis not present

## 2017-06-23 DIAGNOSIS — E038 Other specified hypothyroidism: Secondary | ICD-10-CM | POA: Diagnosis not present

## 2017-06-23 DIAGNOSIS — E118 Type 2 diabetes mellitus with unspecified complications: Secondary | ICD-10-CM | POA: Diagnosis not present

## 2017-06-23 DIAGNOSIS — M25552 Pain in left hip: Secondary | ICD-10-CM | POA: Diagnosis not present

## 2017-06-23 DIAGNOSIS — I5032 Chronic diastolic (congestive) heart failure: Secondary | ICD-10-CM | POA: Diagnosis not present

## 2017-06-23 DIAGNOSIS — I4891 Unspecified atrial fibrillation: Secondary | ICD-10-CM | POA: Diagnosis not present

## 2017-06-23 DIAGNOSIS — I959 Hypotension, unspecified: Secondary | ICD-10-CM | POA: Diagnosis not present

## 2017-06-23 DIAGNOSIS — D649 Anemia, unspecified: Secondary | ICD-10-CM | POA: Diagnosis not present

## 2017-06-23 DIAGNOSIS — I10 Essential (primary) hypertension: Secondary | ICD-10-CM | POA: Diagnosis not present

## 2017-06-23 DIAGNOSIS — I951 Orthostatic hypotension: Secondary | ICD-10-CM | POA: Diagnosis not present

## 2017-06-23 DIAGNOSIS — E860 Dehydration: Secondary | ICD-10-CM | POA: Diagnosis not present

## 2017-06-23 DIAGNOSIS — N183 Chronic kidney disease, stage 3 (moderate): Secondary | ICD-10-CM | POA: Diagnosis not present

## 2017-06-23 DIAGNOSIS — G4733 Obstructive sleep apnea (adult) (pediatric): Secondary | ICD-10-CM | POA: Diagnosis not present

## 2017-06-23 DIAGNOSIS — R2681 Unsteadiness on feet: Secondary | ICD-10-CM | POA: Diagnosis not present

## 2017-06-23 DIAGNOSIS — E785 Hyperlipidemia, unspecified: Secondary | ICD-10-CM | POA: Diagnosis not present

## 2017-06-23 DIAGNOSIS — R531 Weakness: Secondary | ICD-10-CM | POA: Diagnosis not present

## 2017-06-23 DIAGNOSIS — I482 Chronic atrial fibrillation: Secondary | ICD-10-CM | POA: Diagnosis not present

## 2017-06-23 LAB — BASIC METABOLIC PANEL
Anion gap: 6 (ref 5–15)
BUN: 19 mg/dL (ref 6–20)
CHLORIDE: 104 mmol/L (ref 101–111)
CO2: 23 mmol/L (ref 22–32)
Calcium: 8.2 mg/dL — ABNORMAL LOW (ref 8.9–10.3)
Creatinine, Ser: 1.18 mg/dL (ref 0.61–1.24)
GFR, EST NON AFRICAN AMERICAN: 56 mL/min — AB (ref >60)
Glucose, Bld: 87 mg/dL (ref 65–99)
Potassium: 4.2 mmol/L (ref 3.5–5.1)
SODIUM: 133 mmol/L — AB (ref 135–145)

## 2017-06-23 LAB — CBC
HCT: 30.5 % — ABNORMAL LOW (ref 39.0–52.0)
HEMOGLOBIN: 9.7 g/dL — AB (ref 13.0–17.0)
MCH: 28.4 pg (ref 26.0–34.0)
MCHC: 31.8 g/dL (ref 30.0–36.0)
MCV: 89.4 fL (ref 78.0–100.0)
PLATELETS: 176 10*3/uL (ref 150–400)
RBC: 3.41 MIL/uL — ABNORMAL LOW (ref 4.22–5.81)
RDW: 18.5 % — ABNORMAL HIGH (ref 11.5–15.5)
WBC: 6.6 10*3/uL (ref 4.0–10.5)

## 2017-06-23 LAB — GLUCOSE, CAPILLARY
GLUCOSE-CAPILLARY: 73 mg/dL (ref 65–99)
GLUCOSE-CAPILLARY: 95 mg/dL (ref 65–99)
GLUCOSE-CAPILLARY: 91 mg/dL (ref 65–99)

## 2017-06-23 LAB — PROTIME-INR
INR: 1.61
PROTHROMBIN TIME: 19 s — AB (ref 11.4–15.2)

## 2017-06-23 MED ORDER — MIDODRINE HCL 2.5 MG PO TABS: 2.5000 mg | ORAL_TABLET | Freq: Three times a day (TID) | ORAL | Status: AC

## 2017-06-23 MED ORDER — PANTOPRAZOLE SODIUM 40 MG PO TBEC: 40.0000 mg | DELAYED_RELEASE_TABLET | Freq: Every day | ORAL | 0 refills | Status: AC

## 2017-06-23 MED ORDER — MIDODRINE HCL 5 MG PO TABS
2.5000 mg | ORAL_TABLET | Freq: Three times a day (TID) | ORAL | Status: DC
  Administered 2017-06-23: 2.5 mg via ORAL
  Filled 2017-06-23: qty 1

## 2017-06-23 NOTE — Clinical Social Work Placement (Signed)
   CLINICAL SOCIAL WORK PLACEMENT  NOTE  Date:  06/23/2017  Patient Details  Name: Brett [name] MRN: [id number] Date of Birth: 07-30-1932  Clinical Social Work is seeking post-discharge placement for this patient at the [place] level of care (*CSW will initial, date and re-position this form in  chart as items are completed):  Yes   Patient/family provided with [facility] Work Department's list of facilities offering this level of care within the geographic area requested by the patient (or if unable, by the patient's family).  Yes   Patient/family informed of their freedom to choose among providers that offer the needed level of care, that participate in Medicare, Medicaid or managed care program needed by the patient, have an available bed and are willing to accept the patient.  Yes   Patient/family informed of [facility]'s ownership interest in [facility] and [facility], as well as of the fact that they are under no obligation to receive care at these facilities.  PASRR submitted to EDS on 06/23/17     PASRR number received on 06/23/17     Existing PASRR number confirmed on       FL2 transmitted to all facilities in geographic area requested by pt/family on 06/23/17     FL2 transmitted to all facilities within larger geographic area on       Patient informed that his/her managed care company has contracts with or will negotiate with certain facilities, including the following:            Patient/family informed of bed offers received.  Patient chooses bed at       Physician recommends and patient chooses bed at      Patient to be transferred to   on  .  Patient to be transferred to facility by       Patient family notified on   of transfer.  Name of family member notified:        PHYSICIAN Please sign FL2     Additional Comment:    _______________________________________________ Candie Chroman, LCSW 06/23/2017,  12:57 PM

## 2017-06-23 NOTE — Progress Notes (Signed)
Pt discharged via [facility] to [facility] with all of his belongings.

## 2017-06-23 NOTE — Clinical Social Work Note (Signed)
[facility] can offer a bed with a 5-day LOG pending insurance authorization. Clinical liaison for the facilities is check on availability for [facility] but they also extended a bed offer and are agreeable to a 5-day LOG. [facility] declined and [facility] in [place] cannot take LOG's with [facility] pending due to high denial rate. Surveyor, quantity of social work is currently in a meeting. CSW sent him a message and asked him to call once meeting complete to discuss LOG.  Dayton Scrape, [place]

## 2017-06-23 NOTE — NC FL2 (Signed)
[place] LEVEL OF CARE SCREENING TOOL     IDENTIFICATION  Patient Name: Brett [name] Birthdate: 1932/11/26 Sex: male Admission Date (Current Location): 06/18/2017  [facility] and [place] Number:  [employer] and Address:  [facility] [place], [place] [address], [place], [place] [address]      Provider Number: [id number]  Attending Physician Name and Address:  Cristal Ford, DO  Relative Name and Phone Number:       Current Level of Care: Hospital Recommended Level of Care: [facility] Prior Approval Number:    Date Approved/Denied:   PASRR Number: [id number]  Discharge Plan: SNF    Current Diagnoses: Patient Active Problem List   Diagnosis Date Noted  . Orthostasis 06/18/2017  . Normocytic anemia 06/18/2017  . Chronic diastolic CHF (congestive heart failure) ([place]) 06/18/2017  . CHF exacerbation ([place]) 12/20/2016  . Acute congestive heart failure ([place])   . Acute on chronic diastolic CHF (congestive heart failure) ([place]) 10/24/2016  . Jaundice   . Periumbilical pain   . Elevated lipase   . Coagulopathy ([place]) 10/16/2016  . Elevated liver enzymes   . Hyperbilirubinemia   . Hyponatremia   . Abdominal pain 10/15/2016  . A-fib ([place]) 02/23/2014  . Diabetes ([place]) 02/23/2014  . BP (high blood pressure) 02/23/2014  . HLD (hyperlipidemia) 02/23/2014  . Excess weight 02/23/2014  . Chronic diastolic heart failure ([place]) 12/24/2013  . Acute bronchitis 12/06/2013  . DIC (disseminated intravascular coagulation) ([place]) 12/04/2013  . Thrombocytopenia, unspecified ([place]) 12/03/2013  . Pulmonary hypertension ([place]) 12/03/2013  . Wheezing 12/02/2013  . Obesity, morbid ([place]) 12/02/2013  . Transaminitis 12/02/2013  . Type 2 diabetes mellitus with diabetic nephropathy ([place]) 12/01/2013  . Hypertension 12/01/2013  . Atrial fibrillation with RVR ([place]) 12/01/2013  . Gall stones, common bile duct 12/01/2013  . Pancreatitis due to  biliary obstruction 12/01/2013  . Chronic kidney disease (CKD), stage III (moderate) ([place]) 12/01/2013  . Hypothyroidism 12/01/2013  . Cholelithiasis 12/01/2013  . History of other specified conditions presenting hazards to health 02/09/2008  . Lumbar back sprain 02/09/2008  . Herpes zoster keratoconjunctivitis 09/15/2007  . Acute systolic heart failure ([place]) 08/20/2007  . PNA (pneumonia) 08/20/2007  . Fast heart beat 08/20/2007  . Feeling bilious 08/16/2007    Orientation RESPIRATION BLADDER Height & Weight     Self, Time, Place, Situation  Normal, Other (Comment)(Home CPAP at night) Continent Weight: 196 lb (88.9 kg) Height:  6' (182.9 cm)  BEHAVIORAL SYMPTOMS/MOOD NEUROLOGICAL BOWEL NUTRITION STATUS  (None) (None) Incontinent Diet(Soft)  AMBULATORY STATUS COMMUNICATION OF NEEDS Skin   Limited Assist Verbally Skin abrasions, Bruising, Other (Comment)(Skin tear.)                       Personal Care Assistance Level of Assistance  Bathing, Feeding, Dressing Bathing Assistance: Limited assistance Feeding assistance: Limited assistance Dressing Assistance: Limited assistance     Functional Limitations Info  Sight, Hearing, Speech Sight Info: Adequate Hearing Info: Adequate Speech Info: Adequate    SPECIAL CARE FACTORS FREQUENCY  PT (By licensed PT), OT (By licensed OT), Blood pressure     PT Frequency: 5 x week OT Frequency: 5 x week            Contractures Contractures Info: Not present    Additional Factors Info  Code Status, Allergies Code Status Info: Full Allergies Info: NKDA           Current Medications (06/23/2017):  This is the current hospital active medication list Current Facility-Administered Medications  Medication Dose Route Frequency Provider Last Rate Last Dose  . 0.9 %  sodium chloride infusion   Intravenous Continuous Wilford Corner, MD 10 mL/hr at 06/21/17 1600 10 mL/hr at 06/21/17 1600  . 0.9 %  sodium chloride infusion    Intravenous Once Clarene Essex, MD      . acetaminophen (TYLENOL) tablet 650 mg  650 mg Oral Q6H PRN Isaac Bliss, Rayford Halsted, MD   650 mg at 06/22/17 0139   Or  . acetaminophen (TYLENOL) suppository 650 mg  650 mg Rectal Q6H PRN Isaac Bliss, Rayford Halsted, MD      . albuterol (PROVENTIL) (2.5 MG/3ML) 0.083% nebulizer solution 2.5 mg  2.5 mg Inhalation Q6H PRN Cristal Ford, DO      . amiodarone (PACERONE) tablet 200 mg  200 mg Oral Daily Isaac Bliss, Rayford Halsted, MD   200 mg at 06/23/17 0926  . B-complex with vitamin C tablet 1 tablet  1 tablet Oral Daily Isaac Bliss, Rayford Halsted, MD   1 tablet at 06/23/17 [phone]  . benzonatate (TESSALON) capsule 200 mg  200 mg Oral TID PRN Cristal Ford, DO   200 mg at 06/22/17 0900  . diphenhydrAMINE (BENADRYL) 12.5 MG/5ML elixir 50 mg  50 mg Oral QHS PRN Isaac Bliss, Rayford Halsted, MD   12.5 mg at 06/23/17 0029  . ferrous sulfate tablet 325 mg  325 mg Oral Daily Isaac Bliss, Rayford Halsted, MD   325 mg at 06/23/17 8841  . insulin aspart (novoLOG) injection 3 Units  3 Units Subcutaneous TID WC Isaac Bliss, Rayford Halsted, MD   Stopped at 06/22/17 1624  . levothyroxine (SYNTHROID, LEVOTHROID) tablet 50 mcg  50 mcg Oral BH-q7a Isaac Bliss, Rayford Halsted, MD   50 mcg at 06/23/17 0827  . ondansetron (ZOFRAN) tablet 4 mg  4 mg Oral Q6H PRN Isaac Bliss, Rayford Halsted, MD       Or  . ondansetron [facility]) injection 4 mg  4 mg Intravenous Q6H PRN Isaac Bliss, Rayford Halsted, MD   4 mg at 06/21/17 1000  . pantoprazole (PROTONIX) EC tablet 40 mg  40 mg Oral Daily Clarene Essex, MD   40 mg at 06/23/17 0926  . polyethylene glycol powder (GLYCOLAX/MIRALAX) container 255 g  1 Container Oral Once Wilford Corner, MD      . senna-docusate (Senokot-S) tablet 1 tablet  1 tablet Oral QHS PRN Isaac Bliss, Rayford Halsted, MD      . traMADol Veatrice Bourbon) tablet 50 mg  50 mg Oral Q6H PRN Cristal Ford, DO   50 mg at 06/22/17 2358     Discharge Medications: Please see discharge  summary for a list of discharge medications.  Relevant Imaging Results:  Relevant Lab Results:   Additional Information SS#: [id number]. Stable for discharge so will likely need 5-day LOG.  Candie Chroman, LCSW

## 2017-06-23 NOTE — Progress Notes (Signed)
Report given to [facility] at [facility]. Awaiting PTAR for transport.

## 2017-06-23 NOTE — Discharge Summary (Addendum)
Physician Discharge Summary  Brett [name] AJO:[id number] DOB: [birth year] DOA: 06/18/2017  PCP: [name], MD  Admit date: 06/18/2017 Discharge date: 06/23/2017  Time spent: 45 minutes  Recommendations for Outpatient Follow-up:  Patient will be discharged to skilled nursing facility.  Continue physical and occupational therapy.  Patient will need to follow up with primary care provider within one week of discharge, repeat CBC. May follow up with [facility] Gastroenterology as needed.  Patient should continue medications as prescribed.  Patient should follow a heart healthy/carb modified diet.   Discharge Diagnoses:  Symptomatic anemia/Acute on chronic anemia Generalized weakness Orthostasis Hypothyroidism Chronic diastolic heart failure Diabetes mellitus, type II Chronic atrial fibrillation Chronic kidney disease, stage III Left hip pain  Discharge Condition: Stable  Diet recommendation: heart healthy/carb modified  Filed Weights   06/19/17 0451 06/20/17 0503 06/21/17 0702  Weight: 99.8 kg (220 lb) 89.2 kg (196 lb 9.6 oz) 88.9 kg (196 lb)    History of present illness:  on 06/18/2017 by Dr. [name] Pecola Leisure L Justiceis a 84 y.o.malewith history of atrial fibrillation on chronic anticoagulation with Coumadin, chronic kidney disease stage III with a baseline creatinine around 1.8, hypertension, hypothyroidism and type 2 diabetes who comes to the hospital today after EMS was called due to hypotension and weakness. Reportedly blood pressure was initially systolic of 60 and improved to 90 prior to transport. Of note patient has had 2 ED visits in Februaryfor weakness. At one point his metoprolol dose was reduced due to a first-degree AV block and his weakness. Patient tells me that in addition to fatigue he has been having dizziness upon standing and has fallen a few times causing significant skin tears to his right forearm. Has never lost consciousness, has not hit his  head. He has also noticed dark stools, however has been on an iron supplement. He was found to be orthostatic in the ED with systolic blood pressure dropping from the 90s-70s and pulse rate increasing from 80s to 100s while going from sitting to standing., Labs are significant for hemoglobin of 7.0 with an MCV of 90.4, troponin of less than 0.03, creatinine at baseline of 1.86. CT scan of the head is negative, chest x-ray without acute cardiopulmonary process. Admission is requested. Of note, there have apparently been several difficult encounters with patient's family. Family is insistent on transfer to [facility], has apparently been aggressive and combative with staff to the point where security needed to be notified. When I assessed the patient no family was at bedside and patient was calm.  Hospital Course:  Symptomatic anemia/Acute on chronic anemia -Hemoglobin on admission was 7, baseline appears to be 9-11 -Transfused 6u PRBC thus far -Patient given 2 doses of oral Vit K 5mg  and FFP -INR down to 2.08 -hemoglobin currently 9.7 -Gastroenterology consulted and appreciated. -Status post EGD showing small hiatal hernia, nonbleeding erosive gastropathy and duodenal diverticulum. -Status post colonoscopy showing diverticulosis in the sigmoid colon and distal descending colon.  One medium polyp distal sigmoid colon, one small polyp hepatic flexure. -Gastroenterology recommended soft diet, PPI as long as patient is on aspirin, would reevaluate whether patient needs to be on Coumadin.  Follow-up with GI clinic as needed.  Patient should undergo repeat colonoscopy with polypectomy pending pathology results. -During EGD and colonoscopy, patient was noted to be somewhat hypotensive and was placed on Neo-Synephrine.  Currently blood pressure has improved.  2 additional units of packed red blood cells have been ordered for transfusion to aid with  the hypotension. -Had long discussion with patient  regarding the need for continued Coumadin therapy.  I recommended for him to hold this medication and discuss the necessity of it with his primary care physician upon discharge. -repeat CBC in one week  Generalized weakness -Likely secondary to symptomatic anemia -Vitamin B12: 553 -PT/OT consulted, recommended SNF, 3in1 bedside commode, rolling walker  Orthostasis -Suspect secondary to anemia and decreased oral intake -BP currently soft, see discussion above -Will start patient on low dose midodrine  Hypothyroidism -Continue Synthroid  Chronic diastolic heart failure -Patient currently appears to be euvolemic and compensated -Echocardiogram August 20 18 showed an EF of 50-55% -monitor Intake and output, daily weights -hold lasix given blood pressure  Diabetes mellitus, type II -Hemoglobin A1c 4.7- patient no longer taking metformin at home -CBGs stable -diabetes coordinator following, recommended discontinuing ISS  Chronic atrial fibrillation -Coumadin currently on hold -INR was 4.7 on admission, currently down to 2.08, after receiving 2 doses of oral vitamin K 5 mg and FFP -See discussion above -HR currently controlled, continue amiodarone  Chronic kidney disease, stage III -Appears to be stable and at baseline. -Continue to monitor BMP  Left hip pain -suspect secondary to laying in bed  -PT/OT consulted and recommended SNF -continue pain control  Procedures: colonoscopy  EGD  Consultations: Gastroenterology  Discharge Exam: Vitals:   06/23/17 1255 06/23/17 1543  BP: 119/66 93/71  Pulse: (!) 104   Resp: 19 18  Temp: 98.4 F (36.9 C)   SpO2: 96%    Patient states he is feeling better. Denies current chest pain, shortness of breath, dizziness, headache, N/V/D/C.   General: Well developed, well nourished, NAD, appears stated age  32: NCAT, mucous membranes moist.  Neck: Supple  Cardiovascular: S1 S2 auscultated,irregular, no  murmur  Respiratory: Diminished however clear  Abdomen: Soft, nontender, nondistended, + bowel sounds  Extremities: warm dry without cyanosis clubbing or edema  Neuro: AAOx3, nonfocal  Psych: Normal affect and demeanor  Discharge Instructions Discharge Instructions    Discharge instructions   Complete by:  As directed    Patient will be discharged to skilled nursing facility.  Continue physical and occupational therapy.  Patient will need to follow up with primary care provider within one week of discharge, repeat CBC. May follow up with [facility] Gastroenterology as needed.  Patient should continue medications as prescribed.  Patient should follow a heart healthy/carb modified diet.     Allergies as of 06/23/2017   No Known Allergies     Medication List    STOP taking these medications   doxazosin 2 MG tablet Commonly known as:  CARDURA   furosemide 40 MG tablet Commonly known as:  LASIX   metFORMIN 500 MG tablet Commonly known as:  GLUCOPHAGE   warfarin 3 MG tablet Commonly known as:  COUMADIN     TAKE these medications   albuterol 108 (90 Base) MCG/ACT inhaler Commonly known as:  PROVENTIL HFA;VENTOLIN HFA Inhale 1-2 puffs into the lungs every 6 (six) hours as needed for wheezing or shortness of breath.   amiodarone 200 MG tablet Commonly known as:  PACERONE Take 1 tablet (200 mg total) by mouth daily.   aspirin 81 MG EC tablet Take 1 tablet (81 mg total) by mouth daily.   b complex vitamins tablet Take 1 tablet by mouth daily.   diphenhydrAMINE 12.5 MG/5ML elixir Commonly known as:  BENADRYL Take 50 mg by mouth at bedtime as needed for allergies or sleep.   ferrous sulfate  325 (65 FE) MG tablet Take 1 tablet (325 mg total) by mouth daily.   levothyroxine 50 MCG tablet Commonly known as:  SYNTHROID, LEVOTHROID Take 50 mcg by mouth daily before breakfast.   MELATONIN PO Take 4 mg by mouth at bedtime. 2 mg tablets   midodrine 2.5 MG tablet Commonly  known as:  PROAMATINE Take 1 tablet (2.5 mg total) by mouth 3 (three) times daily with meals.   pantoprazole 40 MG tablet Commonly known as:  PROTONIX Take 1 tablet (40 mg total) by mouth daily. Start taking on:  06/24/2017   STIOLTO RESPIMAT 2.5-2.5 MCG/ACT Aers Generic drug:  Tiotropium Bromide-Olodaterol Inhale 1 puff into the lungs daily.      No Known Allergies  Contact information for follow-up providers    [name], MD. Schedule an appointment as soon as possible for a visit in 1 week(s).   Specialty:  Internal Medicine Why:  Hospital follow up Contact information: [address] [place] [address] [phone]        Gastroenterology, [facility] [place]. Call in 1 week(s).   Why:  As needed Contact information: [place] [address] [phone]            Contact information for after-discharge care    [facility] Follow up.   Service:  Skilled Nursing Contact information: [address] [place] [phone]                   The results of significant diagnostics from this hospitalization (including imaging, microbiology, ancillary and laboratory) are listed below for reference.    Significant Diagnostic Studies: Dg Chest 1 View  Result Date: 06/01/2017 CLINICAL DATA:  Generalized weakness 2 weeks. EXAM: CHEST 1 VIEW COMPARISON:  04/14/2017 FINDINGS: Lungs are adequately inflated without focal consolidation or effusion. Calcified granuloma over the lateral left midlung. Mild stable cardiomegaly. Remainder the exam is unchanged. IMPRESSION: No active disease. Electronically Signed   By: Marin Olp M.D.   On: 06/01/2017 12:56   Dg Chest 2 View  Result Date: 06/18/2017 CLINICAL DATA:  Pt brought in from PCP by EMS due to hypotension. Pt was to be seen due dizziness and falls for weeks. Pt seen here and placed on iron pills. BP in PCP office initially 68/40 .  EMS reports 90/62. Denies pain. Pt reports dizzine.*comment was truncated* EXAM: CHEST - 2 VIEW COMPARISON:  06/08/2017 FINDINGS: Normal cardiac silhouette. Lungs are mildly hyperinflated. No effusion, infiltrate or pneumothorax. Calcified pulmonary nodules unchanged from prior. IMPRESSION: No acute cardiopulmonary process. Electronically Signed   By: Suzy Bouchard M.D.   On: 06/18/2017 12:48   Dg Chest 2 View  Result Date: 06/08/2017 CLINICAL DATA:  Dyspnea EXAM: CHEST  2 VIEW COMPARISON:  06/01/2017 chest radiograph. FINDINGS: Stable cardiomediastinal silhouette with borderline mild cardiomegaly. No pneumothorax. Trace bilateral pleural effusions. Borderline mild pulmonary edema. Stable calcified left midlung granuloma. IMPRESSION: 1. Borderline mild congestive heart failure. 2. Trace bilateral pleural effusions. Electronically Signed   By: Ilona Sorrel M.D.   On: 06/08/2017 11:55   Ct Head Wo Contrast  Result Date: 06/18/2017 CLINICAL DATA:  Head trauma.  Hypotension. Initial encounter. EXAM: CT HEAD WITHOUT CONTRAST TECHNIQUE: Contiguous axial images were obtained from the base of the skull through the vertex without intravenous contrast. COMPARISON:  None. FINDINGS: Brain: No evidence of acute infarction, hemorrhage, hydrocephalus, extra-axial collection or mass lesion/mass effect. Nonspecific atrophy  with ventriculomegaly. Chronic small vessel ischemia in the periventricular white matter. Vascular: Atherosclerotic calcification.  No hyperdense vessel. Skull: Negative Sinuses/Orbits: No acute finding.  Left cataract resection. IMPRESSION: No evidence of intracranial injury. Electronically Signed   By: Monte Fantasia M.D.   On: 06/18/2017 12:09   Ct Angio Chest Pe W/cm &/or Wo Cm  Result Date: 06/01/2017 CLINICAL DATA:  Shortness of breath and weakness. EXAM: CT ANGIOGRAPHY CHEST WITH CONTRAST TECHNIQUE: Multidetector CT imaging of the chest was performed using the standard protocol during bolus  administration of intravenous contrast. Multiplanar CT image reconstructions and MIPs were obtained to evaluate the vascular anatomy. CONTRAST:  66mL ISOVUE-370 IOPAMIDOL (ISOVUE-370) INJECTION 76% COMPARISON:  Chest x-ray June 01, 2017 FINDINGS: Cardiovascular: Cardiomegaly is identified as are coronary artery calcifications in both the right and left coronary arteries. The thoracic aorta measures 4.2 cm near the root. Mild atherosclerotic change seen in the thoracic aorta. No dissection although evaluation for dissection is mildly limited. No pulmonary emboli. Mediastinum/Nodes: Tiny pleural effusions, right greater than left, are identified. Pleural calcifications are seen, right greater than left, likely from previous asbestos exposure. No pericardial effusion. The thyroid and esophagus are normal. No adenopathy. Lungs/Pleura: Central airways are normal. No pneumothorax. There is a calcified nodule in the lateral left lung on series 6, image 50 consistent previous granulomatous disease. No suspicious nodules or masses. No overt edema. No focal infiltrate. Upper Abdomen: Previous cholecystectomy. Low-attenuation in the right hepatic lobe on series 4, image 81 likely represents a confluence of vessels and dilated intrahepatic ducts, also seen on the CT scan from October 15, 2016. No acute abnormalities in the upper abdomen. Musculoskeletal: No chest wall abnormality. No acute or significant osseous findings. Review of the MIP images confirms the above findings. IMPRESSION: 1. No pulmonary emboli. 2. Cardiomegaly and small effusions. 3. Coronary artery calcifications. 4. Mild aneurysmal dilatation of the ascending thoracic aorta measuring 4.2 cm. Recommend annual imaging followup by CTA or MRA. This recommendation follows 2010 ACCF/AHA/AATS/ACR/ASA/SCA/SCAI/SIR/STS/SVM Guidelines for the Diagnosis and Management of Patients with Thoracic Aortic Disease. Circulation. 2010; 121: e266-e369 5. Mild atherosclerotic  change in the thoracic aorta. 6. Pleural calcifications, likely from previous asbestos exposure. Aortic Atherosclerosis (ICD10-I70.0). Electronically Signed   By: Dorise Bullion III M.D   On: 06/01/2017 14:44    Microbiology: Recent Results (from the past 240 hour(s))  MRSA PCR Screening     Status: None   Collection Time: 06/20/17  4:33 PM  Result Value Ref Range Status   MRSA by PCR NEGATIVE NEGATIVE Final    Comment:        The GeneXpert MRSA Assay (FDA approved for NASAL specimens only), is one component of a comprehensive MRSA colonization surveillance program. It is not intended to diagnose MRSA infection nor to guide or monitor treatment for MRSA infections. Performed at [facility], [place] [address] [place], [place] [address]   Urine Culture     Status: Abnormal   Collection Time: 06/20/17  4:36 PM  Result Value Ref Range Status   Specimen Description URINE, CLEAN CATCH  Final   Special Requests NONE  Final   Culture (A)  Final    <10,000 COLONIES/mL Performed at [facility], [place] [address] [place], [place] [address]    Report Status 06/21/2017 FINAL  Final     Labs: Basic Metabolic Panel: Recent Labs  Lab 06/18/17 1142 06/20/17 0648 06/21/17 0157 06/22/17 0158 06/23/17 0805  NA 132* 134* 135 133* 133*  K 4.0 4.0 4.2 4.2 4.2  CL 99* 106 104 105 104  CO2 22 21* 22 17* 23  GLUCOSE 108* 83 95 72 87  BUN 36* 33* 25* 22* 19  CREATININE 1.86* 1.37* 1.29* 1.24 1.18  CALCIUM 8.3* 7.7* 7.9* 8.3* 8.2*   Liver Function Tests: Recent Labs  Lab 06/18/17 1142  AST 56*  ALT 32  ALKPHOS 82  BILITOT 0.8  PROT 5.6*  ALBUMIN 3.1*   Recent Labs  Lab 06/18/17 1142  LIPASE 35   No results for input(s): AMMONIA in the last 168 hours. CBC: Recent Labs  Lab 06/18/17 1142  06/20/17 0648 06/21/17 0157 06/21/17 1430 06/22/17 0158 06/23/17 0805  WBC 5.2  --  5.5 7.9  --  6.0 6.6  NEUTROABS 2.7  --   --   --   --   --   --   HGB 7.0*   < > 6.9*  7.9* 9.1* 9.1* 9.7*  HCT 21.6*   < > 20.8* 23.8* 27.2* 26.7* 30.5*  MCV 90.4  --  90.0 87.2  --  89.0 89.4  PLT 201  --  186 159  --  131* 176   < > = values in this interval not displayed.   Cardiac Enzymes: Recent Labs  Lab 06/18/17 1142  TROPONINI <0.03   BNP: BNP (last 3 results) Recent Labs    04/14/17 1528 06/01/17 1221 06/08/17 1058  BNP 720.0* 282.0* 376.0*    ProBNP (last 3 results) No results for input(s): PROBNP in the last 8760 hours.  CBG: Recent Labs  Lab 06/22/17 1602 06/22/17 1631 06/22/17 2111 06/23/17 0818 06/23/17 1311  GLUCAP 40* 101* 96 73 95       Signed:  [facility] 06/23/2017, 4:15 PM

## 2017-06-23 NOTE — Progress Notes (Signed)
Occupational Therapy Evaluation Patient Details Name: Brett [name] MRN: [id number] DOB: Jul 07, 1932 Today's Date: 06/23/2017    History of Present Illness Brett [name] is a 84 y.o. male with history of atrial fibrillation on chronic anticoagulation with Coumadin, chronic kidney disease stage III with a baseline creatinine around 1.8, hypertension, hypothyroidism and type 2 diabetes who comes to the hospital today after EMS was called due to hypotension and weakness; anemia, has had blood transfusions, EGD on 3/9   Clinical Impression   PTA, pt lived at home with significant other and required min guard A with mobility @ RW level and min A for ADL. Pt states he has had several falls prior to DC. Pt demonstrates a decline in function and would benefit from rehab at SNF to maximize functional level of independence to facilitate safe DC home. VSS throughout session on RA. Will follow acutely.     Follow Up Recommendations  SNF;Supervision/Assistance - 24 hour    Equipment Recommendations  3 in 1 bedside commode    Recommendations for Other Services       Precautions / Restrictions Precautions Precautions: Fall Precaution Comments: Orthostatic hypotension      Mobility Bed Mobility Overal bed mobility: Needs Assistance Bed Mobility: Supine to Sit     Supine to sit: Min assist        Transfers Overall transfer level: Needs assistance Equipment used: Rolling walker (2 wheeled) Transfers: Sit to/from Stand Sit to Stand: Min assist    posterior bias          Balance                                           ADL either performed or assessed with clinical judgement   ADL Overall ADL's : Needs assistance/impaired Eating/Feeding: Set up   Grooming: Set up;Supervision/safety;Sitting   Upper Body Bathing: Minimal assistance;Sitting   Lower Body Bathing: Moderate assistance;Sit to/from stand   Upper Body Dressing : Moderate assistance;Sitting    Lower Body Dressing: Maximal assistance;Sit to/from stand   Toilet Transfer: Moderate assistance;Ambulation(simulated)   Toileting- Clothing Manipulation and Hygiene: Minimal assistance Toileting - Clothing Manipulation Details (indicate cue type and reason): pt aware of having condom cath     Functional mobility during ADLs: Minimal assistance(to stand from lower surface; occasionally to catch balance)       Vision   Additional Comments: color blind     Perception     Praxis      Pertinent Vitals/Pain Pain Assessment: No/denies pain     Hand Dominance     Extremity/Trunk Assessment Upper Extremity Assessment Upper Extremity Assessment: Generalized weakness   Lower Extremity Assessment Lower Extremity Assessment: Defer to PT evaluation   Cervical / Trunk Assessment Cervical / Trunk Assessment: Kyphotic   Communication     Cognition Arousal/Alertness: Awake/alert Behavior During Therapy: WFL for tasks assessed/performed Overall Cognitive Status: No family/caregiver present to determine baseline cognitive functioning                                 General Comments: slow processing; difficulty with problem solving   General Comments       Exercises     Shoulder Instructions      Home Living Family/patient expects to be discharged to:: Private residence Living Arrangements: Spouse/significant other Available Help  at Discharge: Family;Available PRN/intermittently Type of Home: House Home Access: Stairs to enter [employer] of Steps: 3   Home Layout: One level     Bathroom Shower/Tub: Teacher, early years/pre: Standard Bathroom Accessibility: Yes How Accessible: Accessible via walker Home Equipment: [place] - 2 wheels   Additional Comments: States he lives with his girlfriend      Prior Functioning/Environment Level of Independence: Needs assistance  Gait / Transfers Assistance Needed: Uses RW for ambulation;  recent falls ADL's / Homemaking Assistance Needed: caregiver assists wtih bathingand dressing as needed            OT Problem List: Decreased strength;Decreased activity tolerance;Impaired balance (sitting and/or standing);Decreased safety awareness;Decreased cognition;Decreased knowledge of use of DME or AE;Cardiopulmonary status limiting activity      OT Treatment/Interventions: Self-care/ADL training;Therapeutic exercise;Energy conservation;DME and/or AE instruction;Therapeutic activities;Cognitive remediation/compensation;Patient/family education;Balance training    OT Goals(Current goals can be found in the care plan section) Acute Rehab OT Goals Patient Stated Goal: to go home OT Goal Formulation: With patient Time For Goal Achievement: 07/07/17 Potential to Achieve Goals: Good  OT Frequency: Min 2X/week   Barriers to D/C:            Co-evaluation              AM-PAC PT "6 Clicks" Daily Activity     Outcome Measure Help from another person eating meals?: None Help from another person taking care of personal grooming?: A Little Help from another person toileting, which includes using toliet, bedpan, or urinal?: A Little Help from another person bathing (including washing, rinsing, drying)?: A Lot Help from another person to put on and taking off regular upper body clothing?: A Little Help from another person to put on and taking off regular lower body clothing?: A Lot 6 Click Score: 17   End of Session Equipment Utilized During Treatment: Gait belt;Rolling walker Nurse Communication: Mobility status  Activity Tolerance: Patient tolerated treatment well Patient left: in chair;with call bell/phone within reach;with chair alarm set  OT Visit Diagnosis: Unsteadiness on feet (R26.81);Muscle weakness (generalized) (M62.81);History of falling (Z91.81);Other symptoms and signs involving cognitive function                Time: 9476-5465 OT Time Calculation (min): 23  min Charges:  OT General Charges $OT Visit: 1 Visit OT Evaluation $OT Eval Moderate Complexity: 1 Mod OT Treatments $Self Care/Home Management : 8-22 mins G-Codes:     [facility], OT/L  [phone] 06/23/2017  Makaylie Dedeaux,HILLARY 06/23/2017, 1:20 PM

## 2017-06-23 NOTE — Discharge Instructions (Signed)
Anemia Anemia is a condition in which you do not have enough red blood cells or hemoglobin. Hemoglobin is a substance in red blood cells that carries oxygen. When you do not have enough red blood cells or hemoglobin (are anemic), your body cannot get enough oxygen and your organs may not work properly. As a result, you may feel very tired or have other problems. What are the causes? Common causes of anemia include:  Excessive bleeding. Anemia can be caused by excessive bleeding inside or outside the body, including bleeding from the intestine or from periods in women.  Poor nutrition.  Long-lasting (chronic) kidney, thyroid, and liver disease.  Bone marrow disorders.  Cancer and treatments for cancer.  HIV (human immunodeficiency virus) and AIDS (acquired immunodeficiency syndrome).  Treatments for HIV and AIDS.  Spleen problems.  Blood disorders.  Infections, medicines, and autoimmune disorders that destroy red blood cells.  What are the signs or symptoms? Symptoms of this condition include:  Minor weakness.  Dizziness.  Headache.  Feeling heartbeats that are irregular or faster than normal (palpitations).  Shortness of breath, especially with exercise.  Paleness.  Cold sensitivity.  Indigestion.  Nausea.  Difficulty sleeping.  Difficulty concentrating.  Symptoms may occur suddenly or develop slowly. If your anemia is mild, you may not have symptoms. How is this diagnosed? This condition is diagnosed based on:  Blood tests.  Your medical history.  A physical exam.  Bone marrow biopsy.  Your health care provider may also check your stool (feces) for blood and may do additional testing to look for the cause of your bleeding. You may also have other tests, including:  Imaging tests, such as a CT scan or MRI.  Endoscopy.  Colonoscopy.  How is this treated? Treatment for this condition depends on the cause. If you continue to lose a lot of blood,  you may need to be treated at a hospital. Treatment may include:  Taking supplements of iron, vitamin T02, or folic acid.  Taking a hormone medicine (erythropoietin) that can help to stimulate red blood cell growth.  Having a blood transfusion. This may be needed if you lose a lot of blood.  Making changes to your diet.  Having surgery to remove your spleen.  Follow these instructions at home:  Take over-the-counter and prescription medicines only as told by your health care provider.  Take supplements only as told by your health care provider.  Follow any diet instructions that you were given.  Keep all follow-up visits as told by your health care provider. This is important. Contact a health care provider if:  You develop new bleeding anywhere in the body. Get help right away if:  You are very weak.  You are short of breath.  You have pain in your abdomen or chest.  You are dizzy or feel faint.  You have trouble concentrating.  You have bloody or black, tarry stools.  You vomit repeatedly or you vomit up blood. Summary  Anemia is a condition in which you do not have enough red blood cells or enough of a substance in your red blood cells that carries oxygen (hemoglobin).  Symptoms may occur suddenly or develop slowly.  If your anemia is mild, you may not have symptoms.  This condition is diagnosed with blood tests as well as a medical history and physical exam. Other tests may be needed.  Treatment for this condition depends on the cause of the anemia. This information is not intended to replace advice  given to you by your health care provider. Make sure you discuss any questions you have with your health care provider. °Document Released: 05/09/2004 Document Revised: 05/03/2016 Document Reviewed: 05/03/2016 °Elsevier Interactive Patient Education © 2018 Elsevier Inc. ° °

## 2017-06-23 NOTE — Progress Notes (Signed)
Inpatient Diabetes Program Recommendations  AACE/ADA: New Consensus Statement on Inpatient Glycemic Control (2015)  Target Ranges:  Prepandial:   less than 140 mg/dL      Peak postprandial:   less than 180 mg/dL (1-2 hours)      Critically ill patients:  140 - 180 mg/dL   Lab Results  Component Value Date   GLUCAP 73 06/23/2017   HGBA1C 4.7 (L) 06/18/2017    Review of Glycemic ControlResults for GRACE, HAGGART (MRN [id number]) as of 06/23/2017 11:24  Ref. Range 06/22/2017 09:24 06/22/2017 16:02 06/22/2017 16:31 06/22/2017 21:11 06/23/2017 08:18  Glucose-Capillary Latest Ref Range: 65 - 99 mg/dL 125 (H) 40 (LL) 101 (H) 96 73   Diabetes history: Type 2 DM Outpatient Diabetes medications: Metformin 500 mg bid Current orders for Inpatient glycemic control:  Novolog sensitive tid with meals and HS, Novolog 3 units tid with meals  Inpatient Diabetes Program Recommendations:    Note CBG's <100 mg/dL. Please consider d/c of Novolog correction and meal coverage.  Sent text page to MD.   Thanks,  Adah Perl, RN, BC-ADM Inpatient Diabetes Coordinator Pager [phone] (8a-5p)

## 2017-06-23 NOTE — Progress Notes (Signed)
Clinical Social Worker facilitated patient discharge including contacting patient family and facility to confirm patient discharge plans.  Clinical information faxed to facility and family agreeable with plan.  CSW arranged ambulance transport via [facility] to [facility] .  RN to call [phone] (pt will go in room 101) for report prior to discharge.  Clinical Social Worker will sign off for now as social work intervention is no longer needed. Please consult [place] again if new need arises.  Rhea Pink, MSW, [place]

## 2017-06-23 NOTE — Clinical Social Work Note (Signed)
Clinical Social Work Assessment  Patient Details  Name: Brett [name] MRN: [id number] Date of Birth: 1933-01-17  Date of referral:  06/23/17               Reason for consult:  Facility Placement, Discharge Planning                Permission sought to share information with:  Chartered certified accountant granted to share information::  Yes, Verbal Permission Granted  Name::        Agency::  SNF's  Relationship::     Contact Information:     Housing/Transportation Living arrangements for the past 2 months:  Single Family Home Source of Information:  Patient, Medical Team Patient Interpreter Needed:  None Criminal Activity/Legal Involvement Pertinent to Current Situation/Hospitalization:  No - Comment as needed Significant Relationships:  Adult Children, Significant Other Lives with:  Significant Other Do you feel safe going back to the place where you live?  Yes Need for family participation in patient care:  Yes (Comment)  Care giving concerns:  PT recommending SNF once medically stable for discharge.   Social Worker assessment / plan:  CSW met with patient. No supports at bedside. CSW introduced role and explained that PT recommendations would be discussed. Patient stated he wanted to think about SNF. CSW explained that he is stable for discharge once facility found so decision needed to be made. Patient is agreeable. CSW explained that we would also need to find a facility that will accept an LOG because [place] Medicare takes around two days to obtain authorization. Patient prefers a facility in [place]. No further concerns. CSW encouraged patient to contact CSW as needed. CSW will continue to follow patient for support and facilitate discharge to SNF once medically stable.  Employment status:  Retired Nurse, adult PT Recommendations:  [place] / Referral to community resources:  [place]  Patient/Family's Response to care:  Patient agreeable to SNF placement. Patient's son and significant other supportive and involved in patient's care. Patient appreciated social work intervention.  Patient/Family's Understanding of and Emotional Response to Diagnosis, Current Treatment, and Prognosis:  Patient has a good understanding of the reason for admission and his need for rehab prior to returning home. Patient appears happy with hospital care.  Emotional Assessment Appearance:  Appears stated age Attitude/Demeanor/Rapport:  Engaged Affect (typically observed):  Accepting, Appropriate, Calm, Pleasant Orientation:  Oriented to Self, Oriented to Place, Oriented to  Time, Oriented to Situation Alcohol / Substance use:  Never Used Psych involvement (Current and /or in the community):  No (Comment)  Discharge Needs  Concerns to be addressed:  Care Coordination Readmission within the last 30 days:  No Current discharge risk:  Dependent with Mobility Barriers to Discharge:  Continued Medical Work up, [place], LCSW 06/23/2017, 12:54 PM

## 2017-06-24 DIAGNOSIS — E118 Type 2 diabetes mellitus with unspecified complications: Secondary | ICD-10-CM | POA: Diagnosis not present

## 2017-06-24 DIAGNOSIS — E039 Hypothyroidism, unspecified: Secondary | ICD-10-CM | POA: Diagnosis not present

## 2017-06-24 DIAGNOSIS — D649 Anemia, unspecified: Secondary | ICD-10-CM | POA: Diagnosis not present

## 2017-06-24 DIAGNOSIS — I482 Chronic atrial fibrillation: Secondary | ICD-10-CM | POA: Diagnosis not present

## 2017-06-25 ENCOUNTER — Ambulatory Visit: Payer: Medicare HMO | Admitting: Cardiovascular Disease

## 2017-06-25 DIAGNOSIS — E039 Hypothyroidism, unspecified: Secondary | ICD-10-CM | POA: Diagnosis not present

## 2017-06-25 DIAGNOSIS — I482 Chronic atrial fibrillation: Secondary | ICD-10-CM | POA: Diagnosis not present

## 2017-06-25 DIAGNOSIS — D649 Anemia, unspecified: Secondary | ICD-10-CM | POA: Diagnosis not present

## 2017-06-25 DIAGNOSIS — I5032 Chronic diastolic (congestive) heart failure: Secondary | ICD-10-CM | POA: Diagnosis not present

## 2017-07-03 DIAGNOSIS — D649 Anemia, unspecified: Secondary | ICD-10-CM | POA: Diagnosis not present

## 2017-07-03 DIAGNOSIS — I482 Chronic atrial fibrillation: Secondary | ICD-10-CM | POA: Diagnosis not present

## 2017-07-03 DIAGNOSIS — I5032 Chronic diastolic (congestive) heart failure: Secondary | ICD-10-CM | POA: Diagnosis not present

## 2017-07-03 DIAGNOSIS — E039 Hypothyroidism, unspecified: Secondary | ICD-10-CM | POA: Diagnosis not present

## 2017-07-08 DIAGNOSIS — I5032 Chronic diastolic (congestive) heart failure: Secondary | ICD-10-CM | POA: Diagnosis not present

## 2017-07-08 DIAGNOSIS — J439 Emphysema, unspecified: Secondary | ICD-10-CM | POA: Diagnosis not present

## 2017-07-08 DIAGNOSIS — E11319 Type 2 diabetes mellitus with unspecified diabetic retinopathy without macular edema: Secondary | ICD-10-CM | POA: Diagnosis not present

## 2017-07-08 DIAGNOSIS — E1122 Type 2 diabetes mellitus with diabetic chronic kidney disease: Secondary | ICD-10-CM | POA: Diagnosis not present

## 2017-07-08 DIAGNOSIS — E1121 Type 2 diabetes mellitus with diabetic nephropathy: Secondary | ICD-10-CM | POA: Diagnosis not present

## 2017-07-08 DIAGNOSIS — D509 Iron deficiency anemia, unspecified: Secondary | ICD-10-CM | POA: Diagnosis not present

## 2017-07-08 DIAGNOSIS — I482 Chronic atrial fibrillation: Secondary | ICD-10-CM | POA: Diagnosis not present

## 2017-07-08 DIAGNOSIS — M25552 Pain in left hip: Secondary | ICD-10-CM | POA: Diagnosis not present

## 2017-07-08 DIAGNOSIS — R296 Repeated falls: Secondary | ICD-10-CM | POA: Diagnosis not present

## 2017-07-08 DIAGNOSIS — I951 Orthostatic hypotension: Secondary | ICD-10-CM | POA: Diagnosis not present

## 2017-07-09 DIAGNOSIS — M6281 Muscle weakness (generalized): Secondary | ICD-10-CM | POA: Diagnosis not present

## 2017-07-09 DIAGNOSIS — R2681 Unsteadiness on feet: Secondary | ICD-10-CM | POA: Diagnosis not present

## 2017-07-09 DIAGNOSIS — G4733 Obstructive sleep apnea (adult) (pediatric): Secondary | ICD-10-CM | POA: Diagnosis not present

## 2017-07-10 ENCOUNTER — Ambulatory Visit: Payer: Medicare HMO | Admitting: Cardiovascular Disease

## 2017-07-10 ENCOUNTER — Encounter: Payer: Self-pay | Admitting: Cardiovascular Disease

## 2017-07-10 VITALS — BP 118/58 | HR 80 | Ht 72.0 in | Wt 190.0 lb

## 2017-07-10 DIAGNOSIS — D649 Anemia, unspecified: Secondary | ICD-10-CM

## 2017-07-10 DIAGNOSIS — Z9289 Personal history of other medical treatment: Secondary | ICD-10-CM

## 2017-07-10 DIAGNOSIS — I10 Essential (primary) hypertension: Secondary | ICD-10-CM | POA: Diagnosis not present

## 2017-07-10 DIAGNOSIS — I25118 Atherosclerotic heart disease of native coronary artery with other forms of angina pectoris: Secondary | ICD-10-CM

## 2017-07-10 DIAGNOSIS — R0609 Other forms of dyspnea: Secondary | ICD-10-CM | POA: Diagnosis not present

## 2017-07-10 DIAGNOSIS — I5032 Chronic diastolic (congestive) heart failure: Secondary | ICD-10-CM

## 2017-07-10 DIAGNOSIS — I4891 Unspecified atrial fibrillation: Secondary | ICD-10-CM | POA: Diagnosis not present

## 2017-07-10 DIAGNOSIS — R942 Abnormal results of pulmonary function studies: Secondary | ICD-10-CM

## 2017-07-10 MED ORDER — FUROSEMIDE 20 MG PO TABS: ORAL_TABLET | ORAL | 3 refills | Status: AC

## 2017-07-10 NOTE — Patient Instructions (Signed)
Your physician wants you to follow-up in:  6 months You will receive a reminder letter in the mail two months in advance. If you don't receive a letter, please call our office to schedule the follow-up appointment.     STOP Amiodarone   STOP Aspirin     Take lasix 20 mg daily as needed for leg swelling      No lab work or tests ordered today.      Thank you for choosing [facility] !

## 2017-07-10 NOTE — Progress Notes (Signed)
SUBJECTIVE: The patient presents for follow-up after recently being hospitalized for symptomatic anemia.  Hemoglobin on admission was 7.  I reviewed the electronic medical record and discharge summary.  EMS was called to his house due to hypotension and weakness.  Systolic blood pressures were initially 60 and improved to 90 prior to transfusion. He was evaluated by gastroenterology.  EGD showed a small hiatal hernia and nonbleeding erosive gastropathy and duodenal diverticulum.  Colonoscopy showed diverticulosis in the sigmoid and distal descending colon.  There were at least 2 polyps found.  Warfarin was held at the time of discharge.  He is not on aspirin either. INR was 4.7 on admission and went down to 2.08 after vitamin K and FFP.  He then went to rehab at the [facility].  He was supposed to stay there for 9 additional days but decided to leave early.  His son and daughter-in-law are helping to take care of him.  They are trying to obtain a hospital bed.  He has been dealing with diffuse body spasms.  It appears he was started on methocarbamol for this reason during his rehabilitation stay.  He denies chest pain.  He does have some mild leg swelling.  Review of Systems: As per "subjective", otherwise negative.  No Known Allergies  Current Outpatient Medications  Medication Sig Dispense Refill  . albuterol (PROVENTIL HFA;VENTOLIN HFA) 108 (90 Base) MCG/ACT inhaler Inhale 1-2 puffs into the lungs every 6 (six) hours as needed for wheezing or shortness of breath.    Marland Kitchen amiodarone (PACERONE) 200 MG tablet Take 1 tablet (200 mg total) by mouth daily. 90 tablet 3  . aspirin EC 81 MG EC tablet Take 1 tablet (81 mg total) by mouth daily.    Marland Kitchen b complex vitamins tablet Take 1 tablet by mouth daily.    . diphenhydrAMINE (BENADRYL) 12.5 MG/5ML elixir Take 50 mg by mouth at bedtime as needed for allergies or sleep.    . ferrous sulfate 325 (65 FE) MG tablet Take 1 tablet (325 mg total) by  mouth daily. 30 tablet 1  . levothyroxine (SYNTHROID, LEVOTHROID) 50 MCG tablet Take 50 mcg by mouth daily before breakfast.     . MELATONIN PO Take 4 mg by mouth at bedtime. 2 mg tablets    . midodrine (PROAMATINE) 2.5 MG tablet Take 1 tablet (2.5 mg total) by mouth 3 (three) times daily with meals.    . pantoprazole (PROTONIX) 40 MG tablet Take 1 tablet (40 mg total) by mouth daily. 30 tablet 0  . Tiotropium Bromide-Olodaterol (STIOLTO RESPIMAT) 2.5-2.5 MCG/ACT AERS Inhale 1 puff into the lungs daily.     No current facility-administered medications for this visit.     Past Medical History:  Diagnosis Date  . Atrial fibrillation ([place])    a. s/p DCCV in 12/2016, remains on Coumadin and Amiodarone  . CAD (coronary artery disease)    a. s/p POBA of D1 in 2009 with residual 20-30% stenosis along RCA and LAD  . Chronic kidney disease (CKD), stage III (moderate) (HCC)   . Dysrhythmia    AFib  . Essential hypertension, benign   . History of cardiac catheterization    Details not certain - reportedly no interventions  . Hypothyroidism   . Macular degeneration   . Pulmonary hypertension ([place]) 12/03/2013   PA pressure 56. Ejection fraction 55-60%  . Type 2 diabetes mellitus ([place])     Past Surgical History:  Procedure Laterality Date  .  CARDIOVERSION N/A 12/10/2013   Procedure: CARDIOVERSION;  Surgeon: Herminio Commons, MD;  Location: [facility];  Service: Endoscopy;  Laterality: N/A;  . CARDIOVERSION N/A 11/15/2016   Procedure: CARDIOVERSION;  Surgeon: Arnoldo Lenis, MD;  Location: [facility];  Service: Endoscopy;  Laterality: N/A;  . CARDIOVERSION N/A 12/18/2016   Procedure: CARDIOVERSION;  Surgeon: Arnoldo Lenis, MD;  Location: [facility];  Service: Endoscopy;  Laterality: N/A;  pt knows to arrive at 8:00  . CATARACT EXTRACTION W/PHACO Left 11/13/2015   Procedure: CATARACT EXTRACTION PHACO AND INTRAOCULAR LENS PLACEMENT LEFT EYE CDE=7.76;  Surgeon: Williams Che, MD;  Location: [facility];  Service: Ophthalmology;  Laterality: Left;  . CHOLECYSTECTOMY    . COLONOSCOPY     Remote > 10 years ago  . COLONOSCOPY WITH PROPOFOL N/A 06/21/2017   Procedure: COLONOSCOPY WITH PROPOFOL;  Surgeon: Clarene Essex, MD;  Location: [place];  Service: Endoscopy;  Laterality: N/A;  . ERCP N/A 10/17/2016   Dr. Gala Romney: sphincterotomy and stone extraction   . ESOPHAGOGASTRODUODENOSCOPY (EGD) WITH PROPOFOL N/A 06/21/2017   Procedure: ESOPHAGOGASTRODUODENOSCOPY (EGD) WITH PROPOFOL;  Surgeon: Clarene Essex, MD;  Location: [place];  Service: Endoscopy;  Laterality: N/A;  . REMOVAL OF STONES  10/17/2016   Procedure: REMOVAL OF STONES with balloon;  Surgeon: Daneil Dolin, MD;  [facility];  Service: Endoscopy;;  . REPLACEMENT TOTAL HIP W/  RESURFACING IMPLANTS Bilateral   . SPHINCTEROTOMY  10/17/2016   Procedure: BILLARY SPHINCTEROTOMY with ballon dilation;  Surgeon: Daneil Dolin, MD;  [facility];  Service: Endoscopy;;  . TEE WITHOUT CARDIOVERSION N/A 12/10/2013   Procedure: TRANSESOPHAGEAL ECHOCARDIOGRAM (TEE);  Surgeon: Herminio Commons, MD;  Location: [facility];  Service: Endoscopy;  Laterality: N/A;    Social History   Socioeconomic History  . Marital status: Single    Spouse name: Not on file  . Number of children: Not on file  . Years of education: Not on file  . Highest education level: Not on file  Occupational History  . Not on file  Social Needs  . Financial resource strain: Not on file  . Food insecurity:    Worry: Not on file    Inability: Not on file  . Transportation needs:    Medical: Not on file    Non-medical: Not on file  Tobacco Use  . Smoking status: Former Smoker    Packs/day: 1.00    Years: 37.00    Pack years: 37.00    Types: Cigarettes    Start date: 11/01/1952    Last attempt to quit: 08/18/1978    Years since quitting: 38.9  . Smokeless tobacco: Never Used  Substance and Sexual Activity  . Alcohol use: Yes    Alcohol/week: 0.0  oz    Comment: Occasional  . Drug use: No  . Sexual activity: Not Currently  Lifestyle  . Physical activity:    Days per week: Not on file    Minutes per session: Not on file  . Stress: Not on file  Relationships  . Social connections:    Talks on phone: Not on file    Gets together: Not on file    Attends religious service: Not on file    Active member of club or organization: Not on file    Attends meetings of clubs or organizations: Not on file    Relationship status: Not on file  . Intimate partner violence:    Fear of current or  ex partner: Not on file    Emotionally abused: Not on file    Physically abused: Not on file    Forced sexual activity: Not on file  Other Topics Concern  . Not on file  Social History Narrative  . Not on file     Vitals:   07/10/17 1103  BP: (!) 118/58  Pulse: 80  SpO2: 96%  Weight: 190 lb (86.2 kg)  Height: 6' (1.829 m)    Wt Readings from Last 3 Encounters:  07/10/17 190 lb (86.2 kg)  06/21/17 196 lb (88.9 kg)  06/08/17 195 lb (88.5 kg)     PHYSICAL EXAM General: NAD, elderly, chronically ill-appearing HEENT: Normal. Neck: No JVD, no thyromegaly. Lungs: Diminished throughout, no crackles or wheezes. CV: Regular rate and irregular rhythm, normal S1/S2, no S3, no murmur.  Trace to 1+ pretibial and peri-ankle edema. Abdomen: Soft, nontender, no distention.  Neurologic: Alert and oriented.  Psych: Normal affect. Skin: Normal. Musculoskeletal: No gross deformities.    ECG: Most recent ECG reviewed.   Labs: Lab Results  Component Value Date/Time   K 4.2 06/23/2017 08:05 AM   BUN 19 06/23/2017 08:05 AM   CREATININE 1.18 06/23/2017 08:05 AM   ALT 32 06/18/2017 11:42 AM   TSH 5.535 (H) 04/14/2017 12:39 PM   TSH 3.130 12/01/2013 08:47 PM   HGB 9.7 (L) 06/23/2017 08:05 AM     Lipids: Lab Results  Component Value Date/Time   LDLCALC 91 10/18/2016 05:58 AM   CHOL 158 10/18/2016 05:58 AM   TRIG 108 10/18/2016 05:58 AM    HDL 45 10/18/2016 05:58 AM       ASSESSMENT AND PLAN: 1. Longstanding persistent atrial fibrillation:Symptomatically stable.Currently on amiodarone. I will discontinue amiodarone.  He is no longer on metoprolol.  He is not a good candidate for anticoagulation.  2. Chronic diastolic heart failure: He has some mild bilateral leg edema.  He is no longer on Lasix.  I instructed his son and daughter-in-law to give 20 mg of Lasix as needed.  3. CAD: Stable ischemic heart disease. Symptoms medically stable. No longer on aspirin,pravastatin nor metoprolol.   4. Hypertension: Controlled. He is on midodrine.  5.  Exertional dyspnea: Pulmonary function test reviewed with findings consistent with COPD.  He has been referred to pulmonology.     6.  Anemia: Stable.  See discussion above.      Disposition: Follow up 6 months  Time spent: 40 minutes, of which greater than 50% was spent reviewing symptoms, relevant blood tests and studies, and discussing management plan with the patient.    Kate Sable, M.D., F.A.C.C.

## 2017-07-11 ENCOUNTER — Other Ambulatory Visit: Payer: Self-pay

## 2017-07-11 ENCOUNTER — Inpatient Hospital Stay (HOSPITAL_COMMUNITY): Payer: Medicare HMO

## 2017-07-11 ENCOUNTER — Emergency Department (HOSPITAL_COMMUNITY): Payer: Medicare HMO

## 2017-07-11 ENCOUNTER — Inpatient Hospital Stay (HOSPITAL_COMMUNITY)
Admission: EM | Admit: 2017-07-11 | Discharge: 2017-07-16 | DRG: 871 | Disposition: A | Discharge status: Skilled Nursing Facility | Payer: Medicare HMO | Attending: Internal Medicine | Admitting: Internal Medicine

## 2017-07-11 ENCOUNTER — Encounter (HOSPITAL_COMMUNITY): Payer: Self-pay | Admitting: Emergency Medicine

## 2017-07-11 DIAGNOSIS — R296 Repeated falls: Secondary | ICD-10-CM | POA: Diagnosis present

## 2017-07-11 DIAGNOSIS — N183 Chronic kidney disease, stage 3 unspecified: Secondary | ICD-10-CM | POA: Diagnosis present

## 2017-07-11 DIAGNOSIS — R740 Nonspecific elevation of levels of transaminase and lactic acid dehydrogenase [LDH]: Secondary | ICD-10-CM | POA: Diagnosis present

## 2017-07-11 DIAGNOSIS — I5032 Chronic diastolic (congestive) heart failure: Secondary | ICD-10-CM | POA: Diagnosis not present

## 2017-07-11 DIAGNOSIS — I480 Paroxysmal atrial fibrillation: Secondary | ICD-10-CM

## 2017-07-11 DIAGNOSIS — J189 Pneumonia, unspecified organism: Secondary | ICD-10-CM | POA: Diagnosis not present

## 2017-07-11 DIAGNOSIS — R279 Unspecified lack of coordination: Secondary | ICD-10-CM | POA: Diagnosis not present

## 2017-07-11 DIAGNOSIS — Z66 Do not resuscitate: Secondary | ICD-10-CM | POA: Diagnosis present

## 2017-07-11 DIAGNOSIS — R1312 Dysphagia, oropharyngeal phase: Secondary | ICD-10-CM | POA: Diagnosis present

## 2017-07-11 DIAGNOSIS — E161 Other hypoglycemia: Secondary | ICD-10-CM | POA: Diagnosis not present

## 2017-07-11 DIAGNOSIS — E1121 Type 2 diabetes mellitus with diabetic nephropathy: Secondary | ICD-10-CM | POA: Diagnosis present

## 2017-07-11 DIAGNOSIS — Z452 Encounter for adjustment and management of vascular access device: Secondary | ICD-10-CM | POA: Diagnosis present

## 2017-07-11 DIAGNOSIS — J9601 Acute respiratory failure with hypoxia: Secondary | ICD-10-CM | POA: Diagnosis not present

## 2017-07-11 DIAGNOSIS — R918 Other nonspecific abnormal finding of lung field: Secondary | ICD-10-CM | POA: Diagnosis not present

## 2017-07-11 DIAGNOSIS — Z79890 Hormone replacement therapy (postmenopausal): Secondary | ICD-10-CM

## 2017-07-11 DIAGNOSIS — J690 Pneumonitis due to inhalation of food and vomit: Secondary | ICD-10-CM | POA: Diagnosis present

## 2017-07-11 DIAGNOSIS — A419 Sepsis, unspecified organism: Secondary | ICD-10-CM | POA: Diagnosis present

## 2017-07-11 DIAGNOSIS — Z7901 Long term (current) use of anticoagulants: Secondary | ICD-10-CM

## 2017-07-11 DIAGNOSIS — R6521 Severe sepsis with septic shock: Secondary | ICD-10-CM | POA: Diagnosis present

## 2017-07-11 DIAGNOSIS — Y95 Nosocomial condition: Secondary | ICD-10-CM | POA: Diagnosis present

## 2017-07-11 DIAGNOSIS — I130 Hypertensive heart and chronic kidney disease with heart failure and stage 1 through stage 4 chronic kidney disease, or unspecified chronic kidney disease: Secondary | ICD-10-CM | POA: Diagnosis present

## 2017-07-11 DIAGNOSIS — G92 Toxic encephalopathy: Secondary | ICD-10-CM | POA: Diagnosis not present

## 2017-07-11 DIAGNOSIS — R031 Nonspecific low blood-pressure reading: Secondary | ICD-10-CM | POA: Diagnosis not present

## 2017-07-11 DIAGNOSIS — E038 Other specified hypothyroidism: Secondary | ICD-10-CM | POA: Diagnosis not present

## 2017-07-11 DIAGNOSIS — M6282 Rhabdomyolysis: Secondary | ICD-10-CM | POA: Diagnosis present

## 2017-07-11 DIAGNOSIS — I951 Orthostatic hypotension: Secondary | ICD-10-CM | POA: Diagnosis not present

## 2017-07-11 DIAGNOSIS — Z961 Presence of intraocular lens: Secondary | ICD-10-CM | POA: Diagnosis present

## 2017-07-11 DIAGNOSIS — R0602 Shortness of breath: Secondary | ICD-10-CM | POA: Diagnosis not present

## 2017-07-11 DIAGNOSIS — Z8249 Family history of ischemic heart disease and other diseases of the circulatory system: Secondary | ICD-10-CM

## 2017-07-11 DIAGNOSIS — E872 Acidosis: Secondary | ICD-10-CM | POA: Diagnosis present

## 2017-07-11 DIAGNOSIS — E039 Hypothyroidism, unspecified: Secondary | ICD-10-CM | POA: Diagnosis present

## 2017-07-11 DIAGNOSIS — R404 Transient alteration of awareness: Secondary | ICD-10-CM | POA: Diagnosis not present

## 2017-07-11 DIAGNOSIS — Z96643 Presence of artificial hip joint, bilateral: Secondary | ICD-10-CM | POA: Diagnosis present

## 2017-07-11 DIAGNOSIS — D509 Iron deficiency anemia, unspecified: Secondary | ICD-10-CM | POA: Diagnosis not present

## 2017-07-11 DIAGNOSIS — Z9842 Cataract extraction status, left eye: Secondary | ICD-10-CM

## 2017-07-11 DIAGNOSIS — Z743 Need for continuous supervision: Secondary | ICD-10-CM | POA: Diagnosis not present

## 2017-07-11 DIAGNOSIS — Z79899 Other long term (current) drug therapy: Secondary | ICD-10-CM

## 2017-07-11 DIAGNOSIS — Z87891 Personal history of nicotine dependence: Secondary | ICD-10-CM

## 2017-07-11 DIAGNOSIS — E1122 Type 2 diabetes mellitus with diabetic chronic kidney disease: Secondary | ICD-10-CM | POA: Diagnosis present

## 2017-07-11 DIAGNOSIS — J449 Chronic obstructive pulmonary disease, unspecified: Secondary | ICD-10-CM | POA: Diagnosis present

## 2017-07-11 DIAGNOSIS — E1140 Type 2 diabetes mellitus with diabetic neuropathy, unspecified: Secondary | ICD-10-CM | POA: Diagnosis not present

## 2017-07-11 DIAGNOSIS — I272 Pulmonary hypertension, unspecified: Secondary | ICD-10-CM | POA: Diagnosis present

## 2017-07-11 DIAGNOSIS — I4581 Long QT syndrome: Secondary | ICD-10-CM | POA: Diagnosis present

## 2017-07-11 DIAGNOSIS — R262 Difficulty in walking, not elsewhere classified: Secondary | ICD-10-CM | POA: Diagnosis not present

## 2017-07-11 DIAGNOSIS — R7401 Elevation of levels of liver transaminase levels: Secondary | ICD-10-CM | POA: Diagnosis present

## 2017-07-11 DIAGNOSIS — M6281 Muscle weakness (generalized): Secondary | ICD-10-CM | POA: Diagnosis not present

## 2017-07-11 DIAGNOSIS — I2510 Atherosclerotic heart disease of native coronary artery without angina pectoris: Secondary | ICD-10-CM | POA: Diagnosis present

## 2017-07-11 DIAGNOSIS — I5033 Acute on chronic diastolic (congestive) heart failure: Secondary | ICD-10-CM | POA: Diagnosis present

## 2017-07-11 DIAGNOSIS — R55 Syncope and collapse: Secondary | ICD-10-CM | POA: Diagnosis not present

## 2017-07-11 LAB — CBC WITH DIFFERENTIAL/PLATELET
BASOS PCT: 0 %
Basophils Absolute: 0 10*3/uL (ref 0.0–0.1)
Basophils Absolute: 0 10*3/uL (ref 0.0–0.1)
Basophils Relative: 0 %
EOS ABS: 0.3 10*3/uL (ref 0.0–0.7)
EOS ABS: 0.2 10*3/uL (ref 0.0–0.7)
EOS PCT: 5 %
Eosinophils Relative: 6 %
HCT: 30.2 % — ABNORMAL LOW (ref 39.0–52.0)
HCT: 28 % — ABNORMAL LOW (ref 39.0–52.0)
HEMOGLOBIN: 9.9 g/dL — AB (ref 13.0–17.0)
HEMOGLOBIN: 9.3 g/dL — AB (ref 13.0–17.0)
Lymphocytes Relative: 15 %
Lymphocytes Relative: 17 %
Lymphs Abs: 0.6 10*3/uL — ABNORMAL LOW (ref 0.7–4.0)
Lymphs Abs: 0.5 10*3/uL — ABNORMAL LOW (ref 0.7–4.0)
MCH: 28.9 pg (ref 26.0–34.0)
MCH: 29.4 pg (ref 26.0–34.0)
MCHC: 32.8 g/dL (ref 30.0–36.0)
MCHC: 33.2 g/dL (ref 30.0–36.0)
MCV: 88 fL (ref 78.0–100.0)
MCV: 88.6 fL (ref 78.0–100.0)
Monocytes Absolute: 0.5 10*3/uL (ref 0.1–1.0)
Monocytes Absolute: 0.3 10*3/uL (ref 0.1–1.0)
Monocytes Relative: 13 %
Monocytes Relative: 8 %
NEUTROS PCT: 66 %
NEUTROS PCT: 70 %
Neutro Abs: 2.7 10*3/uL (ref 1.7–7.7)
Neutro Abs: 2.3 10*3/uL (ref 1.7–7.7)
PLATELETS: 134 10*3/uL — AB (ref 150–400)
PLATELETS: 134 10*3/uL — AB (ref 150–400)
RBC: 3.43 MIL/uL — AB (ref 4.22–5.81)
RBC: 3.16 MIL/uL — AB (ref 4.22–5.81)
RDW: 16.4 % — ABNORMAL HIGH (ref 11.5–15.5)
RDW: 16.5 % — ABNORMAL HIGH (ref 11.5–15.5)
WBC: 4.1 10*3/uL (ref 4.0–10.5)
WBC: 3.2 10*3/uL — AB (ref 4.0–10.5)

## 2017-07-11 LAB — I-STAT CG4 LACTIC ACID, ED: LACTIC ACID, VENOUS: 0.92 mmol/L (ref 0.5–1.9)

## 2017-07-11 LAB — BLOOD GAS, ARTERIAL
Acid-base deficit: 3 mmol/L — ABNORMAL HIGH (ref 0.0–2.0)
Bicarbonate: 21.5 mmol/L (ref 20.0–28.0)
DRAWN BY: 234301
O2 Content: 2 L/min
O2 Saturation: 95.4 %
PH ART: 7.293 — AB (ref 7.350–7.450)
Patient temperature: 37
pCO2 arterial: 48.1 mmHg — ABNORMAL HIGH (ref 32.0–48.0)
pO2, Arterial: 85.5 mmHg (ref 83.0–108.0)

## 2017-07-11 LAB — GLUCOSE, CAPILLARY
GLUCOSE-CAPILLARY: 73 mg/dL (ref 65–99)
GLUCOSE-CAPILLARY: 85 mg/dL (ref 65–99)

## 2017-07-11 LAB — URINALYSIS, ROUTINE W REFLEX MICROSCOPIC
Bilirubin Urine: NEGATIVE
GLUCOSE, UA: NEGATIVE mg/dL
HGB URINE DIPSTICK: NEGATIVE
Ketones, ur: 5 mg/dL — AB
LEUKOCYTES UA: NEGATIVE
Nitrite: NEGATIVE
Protein, ur: NEGATIVE mg/dL
Specific Gravity, Urine: 1.018 (ref 1.005–1.030)
pH: 5 (ref 5.0–8.0)

## 2017-07-11 LAB — PROTIME-INR
INR: 1.38
INR: 1.54
PROTHROMBIN TIME: 16.8 s — AB (ref 11.4–15.2)
Prothrombin Time: 18.4 seconds — ABNORMAL HIGH (ref 11.4–15.2)

## 2017-07-11 LAB — COMPREHENSIVE METABOLIC PANEL
ALK PHOS: 78 U/L (ref 38–126)
ALT: 28 U/L (ref 17–63)
AST: 87 U/L — ABNORMAL HIGH (ref 15–41)
Albumin: 2.7 g/dL — ABNORMAL LOW (ref 3.5–5.0)
Anion gap: 11 (ref 5–15)
BILIRUBIN TOTAL: 1.6 mg/dL — AB (ref 0.3–1.2)
BUN: 15 mg/dL (ref 6–20)
CO2: 22 mmol/L (ref 22–32)
CREATININE: 1.58 mg/dL — AB (ref 0.61–1.24)
Calcium: 7.8 mg/dL — ABNORMAL LOW (ref 8.9–10.3)
Chloride: 101 mmol/L (ref 101–111)
GFR, EST AFRICAN AMERICAN: 45 mL/min — AB (ref >60)
GFR, EST NON AFRICAN AMERICAN: 39 mL/min — AB (ref >60)
Glucose, Bld: 81 mg/dL (ref 65–99)
Potassium: 3.8 mmol/L (ref 3.5–5.1)
Sodium: 134 mmol/L — ABNORMAL LOW (ref 135–145)
TOTAL PROTEIN: 5.1 g/dL — AB (ref 6.5–8.1)

## 2017-07-11 LAB — CK: Total CK: 1488 U/L — ABNORMAL HIGH (ref 49–397)

## 2017-07-11 LAB — CBG MONITORING, ED
GLUCOSE-CAPILLARY: 90 mg/dL (ref 65–99)
GLUCOSE-CAPILLARY: 60 mg/dL — AB (ref 65–99)
GLUCOSE-CAPILLARY: 74 mg/dL (ref 65–99)

## 2017-07-11 LAB — PROCALCITONIN: PROCALCITONIN: 0.33 ng/mL

## 2017-07-11 LAB — I-STAT CHEM 8, ED
BUN: 13 mg/dL (ref 6–20)
CALCIUM ION: 1.11 mmol/L — AB (ref 1.15–1.40)
CREATININE: 1.6 mg/dL — AB (ref 0.61–1.24)
Chloride: 98 mmol/L — ABNORMAL LOW (ref 101–111)
GLUCOSE: 76 mg/dL (ref 65–99)
HCT: 28 % — ABNORMAL LOW (ref 39.0–52.0)
HEMOGLOBIN: 9.5 g/dL — AB (ref 13.0–17.0)
POTASSIUM: 3.8 mmol/L (ref 3.5–5.1)
Sodium: 136 mmol/L (ref 135–145)
TCO2: 27 mmol/L (ref 22–32)

## 2017-07-11 LAB — AMMONIA: Ammonia: 45 umol/L — ABNORMAL HIGH (ref 9–35)

## 2017-07-11 LAB — MRSA PCR SCREENING: MRSA by PCR: NEGATIVE

## 2017-07-11 LAB — POC OCCULT BLOOD, ED: Fecal Occult Bld: NEGATIVE

## 2017-07-11 LAB — INFLUENZA PANEL BY PCR (TYPE A & B)
Influenza A By PCR: NEGATIVE
Influenza B By PCR: NEGATIVE

## 2017-07-11 LAB — TYPE AND SCREEN
ABO/RH(D): O POS
Antibody Screen: NEGATIVE

## 2017-07-11 LAB — CG4 I-STAT (LACTIC ACID): Lactic Acid, Venous: 0.45 mmol/L — ABNORMAL LOW (ref 0.5–1.9)

## 2017-07-11 LAB — BRAIN NATRIURETIC PEPTIDE: B Natriuretic Peptide: 471 pg/mL — ABNORMAL HIGH (ref 0.0–100.0)

## 2017-07-11 LAB — TROPONIN I: TROPONIN I: 0.03 ng/mL — AB (ref <0.03)

## 2017-07-11 LAB — APTT: aPTT: 52 seconds — ABNORMAL HIGH (ref 24–36)

## 2017-07-11 LAB — LACTIC ACID, PLASMA
LACTIC ACID, VENOUS: 0.7 mmol/L (ref 0.5–1.9)
LACTIC ACID, VENOUS: 0.6 mmol/L (ref 0.5–1.9)

## 2017-07-11 LAB — MAGNESIUM: Magnesium: 1.4 mg/dL — ABNORMAL LOW (ref 1.7–2.4)

## 2017-07-11 MED ORDER — VANCOMYCIN HCL 10 G IV SOLR
1250.0000 mg | INTRAVENOUS | Status: DC
  Administered 2017-07-12: 1250 mg via INTRAVENOUS
  Filled 2017-07-11 (×3): qty 1250

## 2017-07-11 MED ORDER — ONDANSETRON HCL 4 MG/2ML IJ SOLN: 4.0000 mg | Freq: Four times a day (QID) | INTRAMUSCULAR | Status: DC | PRN

## 2017-07-11 MED ORDER — ONDANSETRON HCL 4 MG PO TABS: 4.0000 mg | ORAL_TABLET | Freq: Four times a day (QID) | ORAL | Status: DC | PRN

## 2017-07-11 MED ORDER — ENOXAPARIN SODIUM 30 MG/0.3ML ~~LOC~~ SOLN
30.0000 mg | SUBCUTANEOUS | Status: DC
Start: 2017-07-11 — End: 2017-07-11

## 2017-07-11 MED ORDER — PIPERACILLIN-TAZOBACTAM 3.375 G IVPB
3.3750 g | Freq: Three times a day (TID) | INTRAVENOUS | Status: DC
Start: 2017-07-11 — End: 2017-07-11

## 2017-07-11 MED ORDER — PANTOPRAZOLE SODIUM 40 MG PO TBEC: 40.0000 mg | DELAYED_RELEASE_TABLET | Freq: Every day | ORAL | Status: DC

## 2017-07-11 MED ORDER — ENOXAPARIN SODIUM 40 MG/0.4ML ~~LOC~~ SOLN
40.0000 mg | SUBCUTANEOUS | Status: DC
  Administered 2017-07-11 – 2017-07-16 (×6): 40 mg via SUBCUTANEOUS
  Filled 2017-07-11 (×6): qty 0.4

## 2017-07-11 MED ORDER — ACETAMINOPHEN 650 MG RE SUPP: 650.0000 mg | Freq: Four times a day (QID) | RECTAL | Status: DC | PRN

## 2017-07-11 MED ORDER — PIPERACILLIN-TAZOBACTAM 3.375 G IVPB 30 MIN
3.3750 g | Freq: Once | INTRAVENOUS | Status: AC
Start: 2017-07-11 — End: 2017-07-11
  Administered 2017-07-11: 3.375 g via INTRAVENOUS
  Filled 2017-07-11: qty 50

## 2017-07-11 MED ORDER — DEXTROSE 50 % IV SOLN
INTRAVENOUS | Status: AC
  Administered 2017-07-11: 25 g
  Filled 2017-07-11: qty 50

## 2017-07-11 MED ORDER — PANTOPRAZOLE SODIUM 40 MG IV SOLR
40.0000 mg | INTRAVENOUS | Status: DC
  Administered 2017-07-11 – 2017-07-13 (×3): 40 mg via INTRAVENOUS
  Filled 2017-07-11 (×3): qty 40

## 2017-07-11 MED ORDER — VANCOMYCIN HCL 10 G IV SOLR
2000.0000 mg | Freq: Once | INTRAVENOUS | Status: AC
  Administered 2017-07-11: 2000 mg via INTRAVENOUS
  Filled 2017-07-11: qty 2000

## 2017-07-11 MED ORDER — SODIUM CHLORIDE 0.9% FLUSH
3.0000 mL | Freq: Two times a day (BID) | INTRAVENOUS | Status: DC
  Administered 2017-07-11 – 2017-07-16 (×8): 3 mL via INTRAVENOUS

## 2017-07-11 MED ORDER — LEVOTHYROXINE SODIUM 100 MCG IV SOLR
25.0000 ug | Freq: Every day | INTRAVENOUS | Status: DC
  Administered 2017-07-12 – 2017-07-13 (×2): 25 ug via INTRAVENOUS
  Filled 2017-07-11 (×3): qty 5

## 2017-07-11 MED ORDER — B COMPLEX-C PO TABS
1.0000 | ORAL_TABLET | Freq: Every day | ORAL | Status: DC
  Administered 2017-07-12 – 2017-07-16 (×5): 1 via ORAL
  Filled 2017-07-11 (×8): qty 1

## 2017-07-11 MED ORDER — ACETAMINOPHEN 325 MG PO TABS: 650.0000 mg | ORAL_TABLET | Freq: Four times a day (QID) | ORAL | Status: DC | PRN

## 2017-07-11 MED ORDER — LEVOTHYROXINE SODIUM 50 MCG PO TABS
50.0000 ug | ORAL_TABLET | Freq: Every day | ORAL | Status: DC
  Administered 2017-07-12 – 2017-07-16 (×5): 50 ug via ORAL
  Filled 2017-07-11: qty 1
  Filled 2017-07-11 (×2): qty 2
  Filled 2017-07-11: qty 1
  Filled 2017-07-11 (×2): qty 2

## 2017-07-11 MED ORDER — SODIUM CHLORIDE 0.9 % IV SOLN
2.0000 g | INTRAVENOUS | Status: DC
  Administered 2017-07-11 – 2017-07-13 (×3): 2 g via INTRAVENOUS
  Filled 2017-07-11 (×4): qty 2

## 2017-07-11 MED ORDER — FERROUS SULFATE 325 (65 FE) MG PO TABS
325.0000 mg | ORAL_TABLET | Freq: Every day | ORAL | Status: DC
  Administered 2017-07-12 – 2017-07-16 (×5): 325 mg via ORAL
  Filled 2017-07-11 (×5): qty 1

## 2017-07-11 MED ORDER — HYDROCORTISONE NA SUCCINATE PF 100 MG IJ SOLR
100.0000 mg | Freq: Three times a day (TID) | INTRAMUSCULAR | Status: DC
  Administered 2017-07-11 – 2017-07-13 (×6): 100 mg via INTRAVENOUS
  Filled 2017-07-11 (×6): qty 2

## 2017-07-11 MED ORDER — SODIUM CHLORIDE 0.9 % IV BOLUS
1000.0000 mL | Freq: Once | INTRAVENOUS | Status: AC
  Administered 2017-07-11: 1000 mL via INTRAVENOUS

## 2017-07-11 MED ORDER — INSULIN ASPART 100 UNIT/ML ~~LOC~~ SOLN
0.0000 [IU] | SUBCUTANEOUS | Status: DC
  Administered 2017-07-12 (×4): 1 [IU] via SUBCUTANEOUS
  Administered 2017-07-13: 2 [IU] via SUBCUTANEOUS
  Administered 2017-07-13: 1 [IU] via SUBCUTANEOUS
  Administered 2017-07-13 (×2): 2 [IU] via SUBCUTANEOUS
  Administered 2017-07-13 – 2017-07-15 (×3): 1 [IU] via SUBCUTANEOUS

## 2017-07-11 MED ORDER — MAGNESIUM SULFATE 2 GM/50ML IV SOLN
2.0000 g | Freq: Once | INTRAVENOUS | Status: AC
  Administered 2017-07-11: 2 g via INTRAVENOUS
  Filled 2017-07-11: qty 50

## 2017-07-11 MED ORDER — SODIUM CHLORIDE 0.9 % IV BOLUS (SEPSIS)
1000.0000 mL | Freq: Once | INTRAVENOUS | Status: AC
  Administered 2017-07-11: 1000 mL via INTRAVENOUS

## 2017-07-11 MED ORDER — NOREPINEPHRINE BITARTRATE 1 MG/ML IV SOLN
0.0000 ug/min | INTRAVENOUS | Status: DC
  Administered 2017-07-11: 2 ug/min via INTRAVENOUS
  Filled 2017-07-11: qty 4

## 2017-07-11 MED ORDER — VANCOMYCIN HCL IN DEXTROSE 1-5 GM/200ML-% IV SOLN: 1000.0000 mg | Freq: Once | INTRAVENOUS | Status: DC

## 2017-07-11 MED ORDER — MIDODRINE HCL 5 MG PO TABS
2.5000 mg | ORAL_TABLET | Freq: Three times a day (TID) | ORAL | Status: DC
  Administered 2017-07-12 – 2017-07-16 (×15): 2.5 mg via ORAL
  Filled 2017-07-11 (×15): qty 1

## 2017-07-11 MED ORDER — ALBUTEROL SULFATE (2.5 MG/3ML) 0.083% IN NEBU
3.0000 mL | INHALATION_SOLUTION | Freq: Four times a day (QID) | RESPIRATORY_TRACT | Status: DC | PRN
  Administered 2017-07-12: 3 mL via RESPIRATORY_TRACT
  Filled 2017-07-11: qty 3

## 2017-07-11 NOTE — ED Notes (Signed)
Family spoke with Hospitalist, leaving now.

## 2017-07-11 NOTE — H&P (Signed)
TRH H&P   Patient Demographics:    Brett [name], is a 84 y.o. male  MRN: [id number]   DOB - 1932/06/05  Admit Date - 07/11/2017  Outpatient Primary MD for the patient is [name], MD  Referring MD: Dr. Verner Chol  Outpatient Specialists: [facility] cardiology  Patient coming from: Home  Chief Complaint  Patient presents with  . Weakness      HPI:    Brett [name]  is a 84 y.o. male, with history of A. fib on chronic anticoagulation with Coumadin, chronic kidney disease stage III (baseline creatinine of 1.8), hypertension, hypothyroidism, type 2 diabetes mellitus who was hospitalized from 3/6-3/11 with hypotension and weakness with systolic blood pressure reported by EMS of 60s improved with fluids on site.  He was seen in the ED twice before for weakness and blood pressure medication adjusted during that time. He was found to have some dramatic anemia with hemoglobin of 7 (dropped from baseline of 9-11) and received 6 unit PRBC and also given vitamin K along with FFP.  He underwent EGD showing small hiatal hernia and nonbleeding erosive gastropathy and duodenal diverticulum.  Also had a colonoscopy which showed diverticulosis.  He was also hypotensive during EGD and colonoscopy requiring pressors bleed briefly.  He was held off Coumadin until further discussion and discharged to SNF. History provided by son at bedside who informs that patient was discharged to SNF but his girlfriend brought him home 4 days back (patient signed himself out) as they were planning to get married today.  On the second day after returning home he slumped on the floor and his girlfriend called his son to help him get up.  The son also mentions that patient did not get his medications filled after leaving the SNF.  He again had a fall at home 2 days back without sustaining any injury.  He reportedly has been  having poor p.o. intake as well. He was taken to see his cardiologist yesterday where his vitals were stable and his cardiologist discontinued his amiodarone and suggested he was not candidate for long-term anticoagulation.  He was recommended to continue low-dose Lasix as needed for leg edema. This morning the social worker visited his house for evaluation and found him on the floor for unknown duration.  Patient was also not communicating that well.  EMS was then called and patient brought to the ED. I am unable to get much history from the patient as he is poorly communicative.  He was found to be short of breath but denied any chest pain.  No documented fever, nausea, vomiting or diarrhea at home.  No witnessed seizures.  In the ED he was hypotensive with blood pressure of 71/53 mmHg and MEP in the 50s.  He was hypothermic with temperature of 92.7 F and heart rate in the low 60s.  Blood work showed hemoglobin of  9.5 (baseline), platelets of 134, normal chemistry including baseline creatinine of 1.6 troponin of 0.03, glucose of 76 and INR of 1.38.  Lactic acid was normal.  Blood glucose was normal.  Hemoccult was negative.  BNP was in 470 with a CK of 1600.  UA was negative for infection.  Blood cultures sent for sepsis.  Chest x-ray showing mild pulmonary congestion with medial right lung base opacity for possible pneumonia. Patient given 3 L normal saline bolus after which blood pressure improved briefly but again dropped to 83T systolic.  A right IJ was placed and started on levophed. Patient given empiric vancomycin and Zosyn and hospitalist consulted for admission to ICU for septic shock.     Review of systems:    In addition to the HPI above, (review of systems limited as patient poorly communicative) No Fever-chills, No Headache, No changes with Vision or hearing, No problems swallowing food or Liquids, poor p.o. intake  no Chest pain, Cough or Shortness of Breath, No Abdominal pain, No  Nausea or Vommitting, Bowel movements are regular, No Blood in stool or Urine, No dysuria, No new skin rashes or bruises, No new joints pains-aches,  Generalized weakness with fall, tingling, numbness in any extremity, No recent weight gain or loss, No polyuria, polydypsia or polyphagia, No significant Mental Stressors.   With Past History of the following :    Past Medical History:  Diagnosis Date  . Atrial fibrillation ([place])    a. s/p DCCV in 12/2016, remains on Coumadin and Amiodarone  . CAD (coronary artery disease)    a. s/p POBA of D1 in 2009 with residual 20-30% stenosis along RCA and LAD  . Chronic kidney disease (CKD), stage III (moderate) (HCC)   . Dysrhythmia    AFib  . Essential hypertension, benign   . History of cardiac catheterization    Details not certain - reportedly no interventions  . Hypothyroidism   . Macular degeneration   . Pulmonary hypertension ([place]) 12/03/2013   PA pressure 56. Ejection fraction 55-60%  . Type 2 diabetes mellitus ([place])       Past Surgical History:  Procedure Laterality Date  . CARDIOVERSION N/A 12/10/2013   Procedure: CARDIOVERSION;  Surgeon: Herminio Commons, MD;  Location: [facility];  Service: Endoscopy;  Laterality: N/A;  . CARDIOVERSION N/A 11/15/2016   Procedure: CARDIOVERSION;  Surgeon: Arnoldo Lenis, MD;  Location: [facility];  Service: Endoscopy;  Laterality: N/A;  . CARDIOVERSION N/A 12/18/2016   Procedure: CARDIOVERSION;  Surgeon: Arnoldo Lenis, MD;  [facility];  Service: Endoscopy;  Laterality: N/A;  pt knows to arrive at 8:00  . CATARACT EXTRACTION W/PHACO Left 11/13/2015   Procedure: CATARACT EXTRACTION PHACO AND INTRAOCULAR LENS PLACEMENT LEFT EYE CDE=7.76;  Surgeon: Williams Che, MD;  Location: [facility];  Service: Ophthalmology;  Laterality: Left;  . CHOLECYSTECTOMY    . COLONOSCOPY     Remote > 10 years ago  . COLONOSCOPY WITH PROPOFOL N/A 06/21/2017   Procedure: COLONOSCOPY WITH PROPOFOL;  Surgeon:  Clarene Essex, MD;  Location: [place];  Service: Endoscopy;  Laterality: N/A;  . ERCP N/A 10/17/2016   Dr. Gala Romney: sphincterotomy and stone extraction   . ESOPHAGOGASTRODUODENOSCOPY (EGD) WITH PROPOFOL N/A 06/21/2017   Procedure: ESOPHAGOGASTRODUODENOSCOPY (EGD) WITH PROPOFOL;  Surgeon: Clarene Essex, MD;  Location: [place];  Service: Endoscopy;  Laterality: N/A;  . REMOVAL OF STONES  10/17/2016   Procedure: REMOVAL OF STONES with balloon;  Surgeon: Daneil Dolin, MD;  [facility];  Service: Endoscopy;;  . REPLACEMENT TOTAL HIP W/  RESURFACING IMPLANTS Bilateral   . SPHINCTEROTOMY  10/17/2016   Procedure: BILLARY SPHINCTEROTOMY with ballon dilation;  Surgeon: Daneil Dolin, MD;  [facility];  Service: Endoscopy;;  . TEE WITHOUT CARDIOVERSION N/A 12/10/2013   Procedure: TRANSESOPHAGEAL ECHOCARDIOGRAM (TEE);  Surgeon: Herminio Commons, MD;  Location: [facility];  Service: Endoscopy;  Laterality: N/A;      Social History:     Social History   Tobacco Use  . Smoking status: Former Smoker    Packs/day: 1.00    Years: 37.00    Pack years: 37.00    Types: Cigarettes    Start date: 11/01/1952    Last attempt to quit: 08/18/1978    Years since quitting: 38.9  . Smokeless tobacco: Never Used  Substance Use Topics  . Alcohol use: Yes    Alcohol/week: 0.0 oz    Comment: Occasional     Lives -at home with girlfriend  Mobility -independent     Family History :     Family History  Problem Relation Age of Onset  . Hypertension Mother   . Hypertension Father   . Colon cancer Neg Hx       Home Medications:   Prior to Admission medications   Medication Sig Start Date End Date Taking? Authorizing Provider  albuterol (PROVENTIL HFA;VENTOLIN HFA) 108 (90 Base) MCG/ACT inhaler Inhale 1-2 puffs into the lungs every 6 (six) hours as needed for wheezing or shortness of breath.   Yes [provider]  b complex vitamins tablet Take 1 tablet by mouth daily.    Yes [provider]  diphenhydrAMINE (BENADRYL) 12.5 MG/5ML elixir Take 50 mg by mouth at bedtime as needed for allergies or sleep.   Yes [provider]  ferrous sulfate 325 (65 FE) MG tablet Take 1 tablet (325 mg total) by mouth daily. 04/14/17  Yes Nat Christen, MD  furosemide (LASIX) 20 MG tablet Take 20 mg daily as needed for leg swelling 07/10/17  Yes Herminio Commons, MD  levothyroxine (SYNTHROID, LEVOTHROID) 50 MCG tablet Take 1 tablet by mouth daily. 07/08/17  Yes [provider]  MELATONIN PO Take 4 mg by mouth at bedtime. 2 mg tablets   Yes [provider]  midodrine (PROAMATINE) 2.5 MG tablet Take 1 tablet (2.5 mg total) by mouth 3 (three) times daily with meals. 06/23/17  Yes Mikhail, Maryann, DO  pantoprazole (PROTONIX) 40 MG tablet Take 1 tablet (40 mg total) by mouth daily. 06/24/17  Yes Mikhail, Velta Addison, DO  Tiotropium Bromide-Olodaterol (STIOLTO RESPIMAT) 2.5-2.5 MCG/ACT AERS Inhale 1 puff into the lungs daily.   Yes [provider]     Allergies:    No Known Allergies   Physical Exam:   Vitals  Blood pressure (!) 83/46, pulse 63, temperature (!) 93.6 F (34.2 C), resp. rate (!) 23, height 6' (1.829 m), weight 86.2 kg (190 lb), SpO2 100 %.   General: Elderly male appears fatigued, poorly communicative HEENT: Pupils reactive bilaterally, no pallor, no icterus, dry oral mucosa, supple neck, no cervical lymphadenopathy, no JVD Chest: Clear to auscultation bilaterally, no added sound CVS: S1 and S2 regular, no murmurs rubs or gallop GI: Soft, nondistended, nontender, Foley placed in the ED draining clear urine Musculoskeletal: Warm, no edema, normal skin CNS: Alert and awake, knows he is in the hospital but otherwise poorly communicative, moving all extremities,   Data Review:    CBC  Recent Labs  Lab 07/11/17 1235 07/11/17 1256  WBC 4.1  --   HGB 9.9* 9.5*  HCT 30.2* 28.0*  PLT 134*  --   MCV 88.0  --   MCH 28.9  --    MCHC 32.8  --   RDW 16.4*  --   LYMPHSABS 0.6*  --   MONOABS 0.5  --   EOSABS 0.3  --   BASOSABS 0.0  --    ------------------------------------------------------------------------------------------------------------------  Chemistries  Recent Labs  Lab 07/11/17 1235 07/11/17 1256  NA 134* 136  K 3.8 3.8  CL 101 98*  CO2 22  --   GLUCOSE 81 76  BUN 15 13  CREATININE 1.58* 1.60*  CALCIUM 7.8*  --   AST 87*  --   ALT 28  --   ALKPHOS 78  --   BILITOT 1.6*  --    ------------------------------------------------------------------------------------------------------------------ estimated creatinine clearance is 39.1 mL/min (A) (by C-G formula based on SCr of 1.6 mg/dL (H)). ------------------------------------------------------------------------------------------------------------------ No results for input(s): TSH, T4TOTAL, T3FREE, THYROIDAB in the last 72 hours.  Invalid input(s): FREET3  Coagulation profile Recent Labs  Lab 07/11/17 1235  INR 1.38   ------------------------------------------------------------------------------------------------------------------- No results for input(s): DDIMER in the last 72 hours. -------------------------------------------------------------------------------------------------------------------  Cardiac Enzymes Recent Labs  Lab 07/11/17 1235  TROPONINI 0.03*   ------------------------------------------------------------------------------------------------------------------    Component Value Date/Time   BNP 471.0 (H) 07/11/2017 1240     ---------------------------------------------------------------------------------------------------------------  Urinalysis    Component Value Date/Time   COLORURINE AMBER (A) 07/11/2017 1229   APPEARANCEUR HAZY (A) 07/11/2017 1229   LABSPEC 1.018 07/11/2017 1229   PHURINE 5.0 07/11/2017 1229   GLUCOSEU NEGATIVE 07/11/2017 1229   HGBUR NEGATIVE 07/11/2017 1229   BILIRUBINUR  NEGATIVE 07/11/2017 1229   KETONESUR 5 (A) 07/11/2017 1229   PROTEINUR NEGATIVE 07/11/2017 1229   UROBILINOGEN 0.2 12/01/2013 0740   NITRITE NEGATIVE 07/11/2017 1229   LEUKOCYTESUR NEGATIVE 07/11/2017 1229    ----------------------------------------------------------------------------------------------------------------   Imaging Results:    Ct Head Wo Contrast  Result Date: 07/11/2017 CLINICAL DATA:  Multiple falls today. EXAM: CT HEAD WITHOUT CONTRAST TECHNIQUE: Contiguous axial images were obtained from the base of the skull through the vertex without intravenous contrast. COMPARISON:  06/18/2017 FINDINGS: Brain: Stable age related cerebral atrophy, ventriculomegaly and periventricular white matter disease. No extra-axial fluid collections are identified. No CT findings for acute hemispheric infarction or intracranial hemorrhage. No mass lesions. The brainstem and cerebellum are normal. Vascular: No hyperdense vessel or unexpected calcification. Skull: No skull fracture or bone lesion. Sinuses/Orbits: Ethmoid and maxillary sinus disease. The frontal sinuses are clear and the mastoid air cells and middle ear cavities are clear. Other: No scalp lesion or hematoma. IMPRESSION: 1. Age related cerebral atrophy, ventriculomegaly and periventricular white matter disease. 2. No acute intracranial findings or skull fracture. 3. Maxillary and ethmoid sinus disease. Electronically Signed   By: Marijo Sanes M.D.   On: 07/11/2017 13:38   Dg Chest Port 1 View  Result Date: 07/11/2017 CLINICAL DATA:  Weakness, hypotension, coronary artery disease. EXAM: PORTABLE CHEST 1 VIEW COMPARISON:  Chest x-rays dated 06/18/2017 and 06/01/2017. FINDINGS: New opacity at the medial aspects of the right lung base, pneumonia versus asymmetric edema. There is mild bilateral interstitial prominence suggesting interstitial edema. Stable cardiomegaly. IMPRESSION: 1. Cardiomegaly with interstitial thickening, presumed  interstitial edema, suggesting mild CHF/volume overload. 2. More confluent opacity at the medial right lung base could represent asymmetric pulmonary edema or pneumonia, favor edema if afebrile. Electronically Signed  By: Franki Cabot M.D.   On: 07/11/2017 13:24    My personal review of EKG: A. fib at 61, prolonged QTC of 550.   Assessment & Plan:    Principal Problem:   Septic shock ([place]) Possibly due to healthcare associated pneumonia.  Has chronically low blood pressure but hypothermic as well.  Lactic acid normal.  Check pro-calcitonin. Admit to ICU.  Received 3 L IV normal saline bolus without much improvement and started on Levophed.  Added IV hydrocortisone 100 mg 3 times daily.  Follow random cortisol. Dr. Luan Pulling consulted who will evaluate patient in the morning. ABG with mild respiratory acidosis.  Getting O2 sat on 2 L.   Active Problems:   HCAP (healthcare-associated pneumonia) Placed on empiric vancomycin and cefepime.  Follow blood culture, urine for strep antigen and Legionella.  Supportive care with Tylenol.  Acute toxic metabolic encephalopathy Secondary to sepsis.  Keep n.p.o.  Neurochecks.  Given his rhabdomyolysis will obtain EEG to rule out seizures.  Rhabdomyolysis Possibly due to being on the floor for several hours.  Check EEG to rule out seizures.  Monitor CPK.     Type 2 diabetes mellitus with diabetic nephropathy (HCC)   Monitor on sliding scale coverage.  Hold home medications.    Chronic kidney disease (CKD), stage III (moderate) (HCC) Renal function at baseline.    Hypothyroidism Check TSH.  IV Synthroid micrograms daily.    Transaminitis Elevated total bilirubin and AST.  Monitor.     Chronic diastolic heart failure (HCC) Currently euvolemic.  Monitor for now.  Paroxysmal A. fib ([place]) Recently taken off Coumadin for blood loss anemia.  Amiodarone discontinued by cardiology during visit yesterday and off beta-blocker.    COPD Continue  home inhalers as tolerated.  Table on 2 L O2  Iron deficient anemia Stable.  Resume supplement when tolerated.   Prolonged QTC. Avoid QT prolonging agents.  Check magnesium.   DVT Prophylaxis: Subcu Lovenox.  AM Labs Ordered, also please review Full Orders  Family Communication: Admission, patients condition and plan of care including tests being ordered have been discussed with the son at bedside  Code Status DNR (discussed at length with patient's son who is healthcare power of attorney and wishes to continue aggressive care including IV pressors but no further escalation including mechanical ventilation, CPR or cardioversion).  Likely DC to SNF  Condition GUARDED   Consults called: Critical care (Dr. Luan Pulling)  Admission status: Inpatient  Time spent in minutes :70+ ( critical care time)   Louellen Molder M.D on 07/11/2017 at 3:44 PM  Between 7am to 7pm - Pager - [phone]. After 7pm go to [email or web address] - password [facility]  [facility] - Office  [phone]

## 2017-07-11 NOTE — ED Notes (Signed)
Date and time results received: 07/11/17 1347 (use smartphrase ".now" to insert current time)  Test: trop Critical Value: 0.03  Name of Provider Notified goldstein  Orders Received? Or Actions Taken?: none

## 2017-07-11 NOTE — Progress Notes (Addendum)
Pharmacy Antibiotic Note  Brett [name] is a 84 y.o. male admitted on 07/11/2017 with sepsis.  Pharmacy has been consulted for Vancomycin and Zosyn dosing.  Plan: Vancomycin 2000 mg IV x 1 dose Vancomcyin 1250 mg IV every 24 hours. Goal trough 15/20. Cefepime 2000 mg IV q24 hours Monitor labs, c/s, and vanco trough as needed.  Height: 6' (182.9 cm) Weight: 190 lb (86.2 kg) IBW/kg (Calculated) : 77.6  Temp (24hrs), Avg:96.3 F (35.7 C), Min:93.6 F (34.2 C), Max:98.9 F (37.2 C)  Recent Labs  Lab 07/11/17 1235 07/11/17 1256 07/11/17 1258  WBC 4.1  --   --   CREATININE  --  1.60*  --   LATICACIDVEN  --   --  0.92    Estimated Creatinine Clearance: 39.1 mL/min (A) (by C-G formula based on SCr of 1.6 mg/dL (H)).    No Known Allergies  Antimicrobials this admission: Vanco 3/29 >>  Zosyn 3/29 >>3/29 Cefepime 3/29>>  Dose adjustments this admission: N/A  Microbiology results: 3/29 BCx: pending 3/29 UCx: pending    Thank you for allowing pharmacy to be a part of this patient's care.  Margot Ables, PharmD Clinical Pharmacist 07/11/2017 1:30 PM

## 2017-07-11 NOTE — ED Provider Notes (Signed)
[facility] EMERGENCY DEPARTMENT Provider Note   CSN: [id number] Arrival date & time: 07/11/17  1209     History   Chief Complaint Chief Complaint  Patient presents with  . Weakness    HPI Brett [name] is a 84 y.o. male.  HPI  84 year old male with a history of atrial fibrillation on warfarin, chronic kidney disease, diastolic CHF, type 2 diabetes and hypothyroidism presents with weakness.  He was recently admitted and discharged about 2-3 weeks ago.  He states this is been a progressive problem for a few months.  He states that he has been falling recently, fell multiple times yesterday and once today.  He denies a headache but does have a small abrasion to his right temple.  He laid on the floor all night last night because he was too weak to get up. He has also been having some low back pain on the left side but denies any trauma.  He denies abdominal pain but states he has been having vomiting the last couple days.  His stools are always dark so is not sure if he is having blood.  He denies chest pain.  He is short of breath but states that he is always short of breath and always has a cough.  No urinary symptoms.  There is no unilateral weakness, just diffuse global weakness. Has had poor oral intake.  When he was discharged here last time he went to a SNF but states that he was only there for a few days. His left eye is noticed to be injected and he states he noticed that today.  Past Medical History:  Diagnosis Date  . Atrial fibrillation ([place])    a. s/p DCCV in 12/2016, remains on Coumadin and Amiodarone  . CAD (coronary artery disease)    a. s/p POBA of D1 in 2009 with residual 20-30% stenosis along RCA and LAD  . Chronic kidney disease (CKD), stage III (moderate) (HCC)   . Dysrhythmia    AFib  . Essential hypertension, benign   . History of cardiac catheterization    Details not certain - reportedly no interventions  . Hypothyroidism   . Macular degeneration   .  Pulmonary hypertension ([place]) 12/03/2013   PA pressure 56. Ejection fraction 55-60%  . Type 2 diabetes mellitus [facility])     Patient Active Problem List   Diagnosis Date Noted  . Septic shock ([place]) 07/11/2017  . Orthostasis 06/18/2017  . Normocytic anemia 06/18/2017  . Chronic diastolic CHF (congestive heart failure) ([name]) 06/18/2017  . CHF exacerbation ([place]) 12/20/2016  . Acute congestive heart failure ([place])   . Acute on chronic diastolic CHF (congestive heart failure) ([place]) 10/24/2016  . Jaundice   . Periumbilical pain   . Elevated lipase   . Coagulopathy ([place]) 10/16/2016  . Elevated liver enzymes   . Hyperbilirubinemia   . Hyponatremia   . Abdominal pain 10/15/2016  . A-fib ([place]) 02/23/2014  . Diabetes ([place]) 02/23/2014  . BP (high blood pressure) 02/23/2014  . HLD (hyperlipidemia) 02/23/2014  . Excess weight 02/23/2014  . Chronic diastolic heart failure ([place]) 12/24/2013  . Acute bronchitis 12/06/2013  . DIC (disseminated intravascular coagulation) ([place]) 12/04/2013  . Thrombocytopenia, unspecified ([place]) 12/03/2013  . Pulmonary hypertension ([place]) 12/03/2013  . Wheezing 12/02/2013  . Obesity, morbid ([place]) 12/02/2013  . Transaminitis 12/02/2013  . Type 2 diabetes mellitus with diabetic nephropathy ([place]) 12/01/2013  . Hypertension 12/01/2013  . Atrial fibrillation with RVR ([place]) 12/01/2013  . Gall stones,  common bile duct 12/01/2013  . Pancreatitis due to biliary obstruction 12/01/2013  . Chronic kidney disease (CKD), stage III (moderate) ([place]) 12/01/2013  . Hypothyroidism 12/01/2013  . Cholelithiasis 12/01/2013  . History of other specified conditions presenting hazards to health 02/09/2008  . Lumbar back sprain 02/09/2008  . Herpes zoster keratoconjunctivitis 09/15/2007  . Acute systolic heart failure ([place]) 08/20/2007  . PNA (pneumonia) 08/20/2007  . Fast heart beat 08/20/2007  . Feeling bilious 08/16/2007    Past Surgical History:  Procedure Laterality Date  .  CARDIOVERSION N/A 12/10/2013   Procedure: CARDIOVERSION;  Surgeon: Herminio Commons, MD;  Location: [facility] OR[facility];  Service: Endoscopy;  Laterality: N/A;  . CARDIOVERSION N/A 11/15/2016   Procedure: CARDIOVERSION;  Surgeon: Arnoldo Lenis, MD;  Location: [facility];  Service: Endoscopy;  Laterality: N/A;  . CARDIOVERSION N/A 12/18/2016   Procedure: CARDIOVERSION;  Surgeon: Arnoldo Lenis, MD;  Location: [facility];  Service: Endoscopy;  Laterality: N/A;  pt knows to arrive at 8:00  . CATARACT EXTRACTION W/PHACO Left 11/13/2015   Procedure: CATARACT EXTRACTION PHACO AND INTRAOCULAR LENS PLACEMENT LEFT EYE CDE=7.76;  Surgeon: Williams Che, MD;  Location: [facility];  Service: Ophthalmology;  Laterality: Left;  . CHOLECYSTECTOMY    . COLONOSCOPY     Remote > 10 years ago  . COLONOSCOPY WITH PROPOFOL N/A 06/21/2017   Procedure: COLONOSCOPY WITH PROPOFOL;  Surgeon: Clarene Essex, MD;  Location: [facility];  Service: Endoscopy;  Laterality: N/A;  . ERCP N/A 10/17/2016   Dr. Gala Romney: sphincterotomy and stone extraction   . ESOPHAGOGASTRODUODENOSCOPY (EGD) WITH PROPOFOL N/A 06/21/2017   Procedure: ESOPHAGOGASTRODUODENOSCOPY (EGD) WITH PROPOFOL;  Surgeon: Clarene Essex, MD;  Location: [facility];  Service: Endoscopy;  Laterality: N/A;  . REMOVAL OF STONES  10/17/2016   Procedure: REMOVAL OF STONES with balloon;  Surgeon: Daneil Dolin, MD;  Location[facility];  Service: Endoscopy;;  . REPLACEMENT TOTAL HIP W/  RESURFACING IMPLANTS Bilateral   . SPHINCTEROTOMY  10/17/2016   Procedure: BILLARY SPHINCTEROTOMY with ballon dilation;  Surgeon: Daneil Dolin, MD;  [facility];  Service: Endoscopy;;  . TEE WITHOUT CARDIOVERSION N/A 12/10/2013   Procedure: TRANSESOPHAGEAL ECHOCARDIOGRAM (TEE);  Surgeon: Herminio Commons, MD;  Location: [facility];  Service: Endoscopy;  Laterality: N/A;        Home Medications    Prior to Admission medications   Medication Sig Start Date End Date Taking? Authorizing  Provider  albuterol (PROVENTIL HFA;VENTOLIN HFA) 108 (90 Base) MCG/ACT inhaler Inhale 1-2 puffs into the lungs every 6 (six) hours as needed for wheezing or shortness of breath.   Yes [provider]  b complex vitamins tablet Take 1 tablet by mouth daily.   Yes [provider]  diphenhydrAMINE (BENADRYL) 12.5 MG/5ML elixir Take 50 mg by mouth at bedtime as needed for allergies or sleep.   Yes [provider]  ferrous sulfate 325 (65 FE) MG tablet Take 1 tablet (325 mg total) by mouth daily. 04/14/17  Yes Nat Christen, MD  furosemide (LASIX) 20 MG tablet Take 20 mg daily as needed for leg swelling 07/10/17  Yes Herminio Commons, MD  levothyroxine (SYNTHROID, LEVOTHROID) 50 MCG tablet Take 1 tablet by mouth daily. 07/08/17  Yes [provider]  MELATONIN PO Take 4 mg by mouth at bedtime. 2 mg tablets   Yes [provider]  midodrine (PROAMATINE) 2.5 MG tablet Take 1 tablet (2.5 mg total) by mouth 3 (three) times daily with  meals. 06/23/17  Yes Mikhail, Maryann, DO  pantoprazole (PROTONIX) 40 MG tablet Take 1 tablet (40 mg total) by mouth daily. 06/24/17  Yes Mikhail, Velta Addison, DO  Tiotropium Bromide-Olodaterol (STIOLTO RESPIMAT) 2.5-2.5 MCG/ACT AERS Inhale 1 puff into the lungs daily.   Yes [provider]    Family History Family History  Problem Relation Age of Onset  . Hypertension Mother   . Hypertension Father   . Colon cancer Neg Hx     Social History Social History   Tobacco Use  . Smoking status: Former Smoker    Packs/day: 1.00    Years: 37.00    Pack years: 37.00    Types: Cigarettes    Start date: 11/01/1952    Last attempt to quit: 08/18/1978    Years since quitting: 38.9  . Smokeless tobacco: Never Used  Substance Use Topics  . Alcohol use: Yes    Alcohol/week: 0.0 oz    Comment: Occasional  . Drug use: No     Allergies   Patient has no known allergies.   Review of Systems Review of Systems  Constitutional:  Negative for fever.  Respiratory: Positive for cough and shortness of breath.   Cardiovascular: Negative for chest pain.  Gastrointestinal: Positive for vomiting. Negative for abdominal pain and blood in stool.  Genitourinary: Negative for dysuria.  Musculoskeletal: Positive for back pain.  Neurological: Positive for weakness. Negative for headaches.  All other systems reviewed and are negative.    Physical Exam Updated Vital Signs BP (!) 83/46   Pulse 63   Temp (!) 93.6 F (34.2 C)   Resp (!) 23   Ht 6' (1.829 m)   Wt 86.2 kg (190 lb)   SpO2 100%   BMI 25.77 kg/m   Physical Exam  Constitutional: He is oriented to person, place, and time. He appears well-developed and well-nourished. No distress.  HENT:  Head: Normocephalic.  Right Ear: External ear normal.  Left Ear: External ear normal.  Nose: Nose normal.  Mouth/Throat: Mucous membranes are dry.  Small skin tear to right temple  Eyes: Pupils are equal, round, and reactive to light. EOM are normal. Right eye exhibits no discharge. Left eye exhibits no discharge.  Mild left eye injection  Neck: Neck supple.  Cardiovascular: Normal rate, regular rhythm and normal heart sounds.  Pulmonary/Chest: Effort normal and breath sounds normal. No accessory muscle usage. No tachypnea. He has no wheezes.  Abdominal: Soft. There is no tenderness.  Musculoskeletal: He exhibits edema (mild, BLE).       Thoracic back: He exhibits no tenderness and no bony tenderness.       Lumbar back: He exhibits no tenderness and no bony tenderness.  Neurological: He is alert and oriented to person, place, and time.  Appears sleepy but is awake and alert and answers questions appropriately. No facial droop. Soft speech but not slurred. 5/5 strength in all 4 extremities  Skin: Skin is warm and dry. He is not diaphoretic.  Multiple skin tears to arms and left chest Multiple areas of ecchymosis  Nursing note and vitals reviewed.    ED Treatments /  Results  Labs (all labs ordered are listed, but only abnormal results are displayed) Labs Reviewed  COMPREHENSIVE METABOLIC PANEL - Abnormal; Notable for the following components:      Result Value   Sodium 134 (*)    Creatinine, Ser 1.58 (*)    Calcium 7.8 (*)    Total Protein 5.1 (*)    Albumin  2.7 (*)    AST 87 (*)    Total Bilirubin 1.6 (*)    GFR calc non Af Amer 39 (*)    GFR calc Af Amer 45 (*)    All other components within normal limits  CBC WITH DIFFERENTIAL/PLATELET - Abnormal; Notable for the following components:   RBC 3.43 (*)    Hemoglobin 9.9 (*)    HCT 30.2 (*)    RDW 16.4 (*)    Platelets 134 (*)    Lymphs Abs 0.6 (*)    All other components within normal limits  URINALYSIS, ROUTINE W REFLEX MICROSCOPIC - Abnormal; Notable for the following components:   Color, Urine AMBER (*)    APPearance HAZY (*)    Ketones, ur 5 (*)    All other components within normal limits  PROTIME-INR - Abnormal; Notable for the following components:   Prothrombin Time 16.8 (*)    All other components within normal limits  TROPONIN I - Abnormal; Notable for the following components:   Troponin I 0.03 (*)    All other components within normal limits  CK - Abnormal; Notable for the following components:   Total CK 1,488 (*)    All other components within normal limits  BRAIN NATRIURETIC PEPTIDE - Abnormal; Notable for the following components:   B Natriuretic Peptide 471.0 (*)    All other components within normal limits  BLOOD GAS, ARTERIAL - Abnormal; Notable for the following components:   pH, Arterial 7.293 (*)    pCO2 arterial 48.1 (*)    Acid-base deficit 3.0 (*)    All other components within normal limits  AMMONIA - Abnormal; Notable for the following components:   Ammonia 45 (*)    All other components within normal limits  CBC WITH DIFFERENTIAL/PLATELET - Abnormal; Notable for the following components:   WBC 3.2 (*)    RBC 3.16 (*)    Hemoglobin 9.3 (*)    HCT  28.0 (*)    RDW 16.5 (*)    Platelets 134 (*)    Lymphs Abs 0.5 (*)    All other components within normal limits  PROTIME-INR - Abnormal; Notable for the following components:   Prothrombin Time 18.4 (*)    All other components within normal limits  APTT - Abnormal; Notable for the following components:   aPTT 52 (*)    All other components within normal limits  I-STAT CHEM 8, ED - Abnormal; Notable for the following components:   Chloride 98 (*)    Creatinine, Ser 1.60 (*)    Calcium, Ion 1.11 (*)    Hemoglobin 9.5 (*)    HCT 28.0 (*)    All other components within normal limits  CBG MONITORING, ED - Abnormal; Notable for the following components:   Glucose-Capillary 60 (*)    All other components within normal limits  CG4 I-STAT (LACTIC ACID) - Abnormal; Notable for the following components:   Lactic Acid, Venous 0.45 (*)    All other components within normal limits  CULTURE, BLOOD (ROUTINE X 2)  CULTURE, BLOOD (ROUTINE X 2)  URINE CULTURE  MRSA PCR SCREENING  LACTIC ACID, PLASMA  PROCALCITONIN  GLUCOSE, CAPILLARY  INFLUENZA PANEL BY PCR (TYPE A & B)  CORTISOL  LACTIC ACID, PLASMA  CBC  COMPREHENSIVE METABOLIC PANEL  CBC  LEGIONELLA PNEUMOPHILA SEROGP 1 UR AG  STREP PNEUMONIAE URINARY ANTIGEN  MAGNESIUM  CBG MONITORING, ED  I-STAT CG4 LACTIC ACID, ED  POC OCCULT BLOOD, ED  I-STAT  CG4 LACTIC ACID, ED  CBG MONITORING, ED  TYPE AND SCREEN    EKG EKG Interpretation  Date/Time:  Friday July 11 2017 12:18:01 EDT Ventricular Rate:  61 PR Interval:    QRS Duration: 149 QT Interval:  544 QTC Calculation: 549 R Axis:   0 Text Interpretation:  Atrial fibrillation IVCD, consider atypical RBBB no significant change since Jun 18 2017 Confirmed by Sherwood Gambler [phone]) on 07/11/2017 12:31:40 PM   Radiology Ct Head Wo Contrast  Result Date: 07/11/2017 CLINICAL DATA:  Multiple falls today. EXAM: CT HEAD WITHOUT CONTRAST TECHNIQUE: Contiguous axial images were  obtained from the base of the skull through the vertex without intravenous contrast. COMPARISON:  06/18/2017 FINDINGS: Brain: Stable age related cerebral atrophy, ventriculomegaly and periventricular white matter disease. No extra-axial fluid collections are identified. No CT findings for acute hemispheric infarction or intracranial hemorrhage. No mass lesions. The brainstem and cerebellum are normal. Vascular: No hyperdense vessel or unexpected calcification. Skull: No skull fracture or bone lesion. Sinuses/Orbits: Ethmoid and maxillary sinus disease. The frontal sinuses are clear and the mastoid air cells and middle ear cavities are clear. Other: No scalp lesion or hematoma. IMPRESSION: 1. Age related cerebral atrophy, ventriculomegaly and periventricular white matter disease. 2. No acute intracranial findings or skull fracture. 3. Maxillary and ethmoid sinus disease. Electronically Signed   By: Marijo Sanes M.D.   On: 07/11/2017 13:38   Dg Chest Port 1 View  Result Date: 07/11/2017 CLINICAL DATA:  Weakness, hypotension, coronary artery disease. EXAM: PORTABLE CHEST 1 VIEW COMPARISON:  Chest x-rays dated 06/18/2017 and 06/01/2017. FINDINGS: New opacity at the medial aspects of the right lung base, pneumonia versus asymmetric edema. There is mild bilateral interstitial prominence suggesting interstitial edema. Stable cardiomegaly. IMPRESSION: 1. Cardiomegaly with interstitial thickening, presumed interstitial edema, suggesting mild CHF/volume overload. 2. More confluent opacity at the medial right lung base could represent asymmetric pulmonary edema or pneumonia, favor edema if afebrile. Electronically Signed   By: Franki Cabot M.D.   On: 07/11/2017 13:24   Dg Chest Port 1v Same Day  Result Date: 07/11/2017 CLINICAL DATA:  Central line placement. EXAM: PORTABLE CHEST 1 VIEW COMPARISON:  07/11/2017 at 12:58 p.m. FINDINGS: Interval placement of right IJ central venous catheter with tip over the SVC. Lungs  are adequately inflated with mild prominence of the perihilar markings suggesting mild vascular congestion. No pneumothorax. Improved hazy opacification over the right base. No evidence of effusion. Mild stable cardiomegaly. Remainder of the exam is unchanged. IMPRESSION: Findings suggesting mild vascular congestion with mild stable cardiomegaly. Right IJ central venous catheter with tip over the SVC. No pneumothorax. Electronically Signed   By: Marin Olp M.D.   On: 07/11/2017 15:58    Procedures .Central Line Date/Time: 07/11/2017 3:40 PM Performed by: Sherwood Gambler, MD Authorized by: Sherwood Gambler, MD   Procedure details:    Location:  R internal jugular   Patient position:  Trendelenburg   Procedural supplies:  Triple lumen   Ultrasound guidance: yes     Sterile ultrasound techniques: Sterile gel and sterile probe covers were used     Number of attempts:  1   Successful placement: yes   Post-procedure details:    Post-procedure:  Dressing applied and line sutured   Assessment:  Blood return through all ports, free fluid flow, no pneumothorax on x-ray and placement verified by x-ray   Patient tolerance of procedure:  Tolerated well, no immediate complications .Critical Care Performed by: Sherwood Gambler, MD Authorized by: Regenia Skeeter,  Nicki Reaper, MD   Critical care provider statement:    Critical care time (minutes):  40   Critical care time was exclusive of:  Separately billable procedures and treating other patients   Critical care was necessary to treat or prevent imminent or life-threatening deterioration of the following conditions:  Cardiac failure, circulatory failure, respiratory failure and shock   Critical care was time spent personally by me on the following activities:  Development of treatment plan with patient or surrogate, discussions with consultants, evaluation of patient's response to treatment, examination of patient, obtaining history from patient or surrogate,  ordering and performing treatments and interventions, ordering and review of laboratory studies, ordering and review of radiographic studies, pulse oximetry, re-evaluation of patient's condition and review of old charts   (including critical care time)  Medications Ordered in ED Medications  vancomycin (VANCOCIN) 2,000 mg in sodium chloride 0.9 % 500 mL IVPB (2,000 mg Intravenous New Bag/Given 07/11/17 1400)  vancomycin (VANCOCIN) 1,250 mg in sodium chloride 0.9 % 250 mL IVPB (has no administration in time range)  piperacillin-tazobactam (ZOSYN) IVPB 3.375 g (has no administration in time range)  norepinephrine (LEVOPHED) 4 mg in dextrose 5 % 250 mL (0.016 mg/mL) infusion (has no administration in time range)  hydrocortisone sodium succinate (SOLU-CORTEF) 100 MG injection 100 mg (has no administration in time range)  sodium chloride 0.9 % bolus 1,000 mL (0 mLs Intravenous Stopped 07/11/17 1424)  sodium chloride 0.9 % bolus 1,000 mL (0 mLs Intravenous Stopped 07/11/17 1425)    And  sodium chloride 0.9 % bolus 1,000 mL (0 mLs Intravenous Stopped 07/11/17 1424)  piperacillin-tazobactam (ZOSYN) IVPB 3.375 g (0 g Intravenous Stopped 07/11/17 1406)  dextrose 50 % solution (25 g  Given 07/11/17 1446)     Initial Impression / Assessment and Plan / ED Course  I have reviewed the triage vital signs and the nursing notes.  Pertinent labs & imaging results that were available during my care of the patient were reviewed by me and considered in my medical decision making (see chart for details).  Clinical Course as of Jul 12 1538  Fri Jul 11, 2017  1306 Temp is 66 rectal. Will active code sepsis, give broad antibiotics, and give sepsis fluids   [SG]    Clinical Course User Index [SG] Sherwood Gambler, MD    Initially patient was treated for presumed dehydration given poor p.o. intake and hypotension.  However his blood pressure still is low and a rectal temp shows hypothermia with a temperature of about  93.  Thus she was started on sepsis protocol and given broad antibiotics given no clear source.  He does have somewhat of a weak cough and shortness of breath and appears to have a possible right lower lobe pneumonia on x-ray.  Flu swab will also be sent.  No obvious UTI.  No focal tenderness such as in his abdomen otherwise.  Despite 1 L IV fluids by EMS and 2 L here to get to 30 cc/KG he is still hypotensive.  He is mildly confused per family.  After consent from family, I performed a central line as above and he was started on levo fed.  He will need to be admitted to the ICU.  The hospitalist will admit and will consult Dr. Luan Pulling as well.  Otherwise, his oxygen is stable on 2 L and he is ventilating appropriately.  He does not appear to need emergent intubation.  Admit to the ICU in critical condition.  Final Clinical  Impressions(s) / ED Diagnoses   Final diagnoses:  Septic shock ([place])  HCAP (healthcare-associated pneumonia)    ED Discharge Orders    None       Sherwood Gambler, MD 07/11/17 1929

## 2017-07-11 NOTE — ED Notes (Signed)
Pt moved to room 2 from room 14 to give more room for the doctor to operate.

## 2017-07-11 NOTE — ED Triage Notes (Signed)
Pt was brought from home after multiple falls today.  States pt has refused transport, but agreed to come the last time.  Multiple skin tears noted.  Ems reports hypoglycemia in 60s which was given a 1/2 amp of D50.  Initially hypotensive per ems and given 579ml of fluid.  Pt is alert and oriented.

## 2017-07-11 NOTE — ED Notes (Signed)
Report given to Liz, RN

## 2017-07-11 NOTE — ED Notes (Signed)
Patient's HCPOA contact information:  [facility] cell [phone] Home [phone]

## 2017-07-12 DIAGNOSIS — R740 Nonspecific elevation of levels of transaminase and lactic acid dehydrogenase [LDH]: Secondary | ICD-10-CM

## 2017-07-12 DIAGNOSIS — M6282 Rhabdomyolysis: Secondary | ICD-10-CM

## 2017-07-12 LAB — COMPREHENSIVE METABOLIC PANEL
ALBUMIN: 2.7 g/dL — AB (ref 3.5–5.0)
ALT: 32 U/L (ref 17–63)
AST: 100 U/L — AB (ref 15–41)
Alkaline Phosphatase: 77 U/L (ref 38–126)
Anion gap: 18 — ABNORMAL HIGH (ref 5–15)
BUN: 18 mg/dL (ref 6–20)
CHLORIDE: 102 mmol/L (ref 101–111)
CO2: 16 mmol/L — AB (ref 22–32)
Calcium: 7.5 mg/dL — ABNORMAL LOW (ref 8.9–10.3)
Creatinine, Ser: 1.75 mg/dL — ABNORMAL HIGH (ref 0.61–1.24)
GFR calc Af Amer: 40 mL/min — ABNORMAL LOW (ref >60)
GFR, EST NON AFRICAN AMERICAN: 35 mL/min — AB (ref >60)
Glucose, Bld: 117 mg/dL — ABNORMAL HIGH (ref 65–99)
POTASSIUM: 4.9 mmol/L (ref 3.5–5.1)
SODIUM: 136 mmol/L (ref 135–145)
Total Bilirubin: 1.4 mg/dL — ABNORMAL HIGH (ref 0.3–1.2)
Total Protein: 5.2 g/dL — ABNORMAL LOW (ref 6.5–8.1)

## 2017-07-12 LAB — CBC
HEMATOCRIT: 30.4 % — AB (ref 39.0–52.0)
Hemoglobin: 9.7 g/dL — ABNORMAL LOW (ref 13.0–17.0)
MCH: 28.8 pg (ref 26.0–34.0)
MCHC: 31.9 g/dL (ref 30.0–36.0)
MCV: 90.2 fL (ref 78.0–100.0)
Platelets: 188 10*3/uL (ref 150–400)
RBC: 3.37 MIL/uL — ABNORMAL LOW (ref 4.22–5.81)
RDW: 16.7 % — AB (ref 11.5–15.5)
WBC: 3.8 10*3/uL — AB (ref 4.0–10.5)

## 2017-07-12 LAB — CK: Total CK: 610 U/L — ABNORMAL HIGH (ref 49–397)

## 2017-07-12 LAB — GLUCOSE, CAPILLARY
GLUCOSE-CAPILLARY: 127 mg/dL — AB (ref 65–99)
Glucose-Capillary: 113 mg/dL — ABNORMAL HIGH (ref 65–99)
Glucose-Capillary: 95 mg/dL (ref 65–99)

## 2017-07-12 LAB — CORTISOL: CORTISOL PLASMA: 87.5 ug/dL

## 2017-07-12 LAB — URINE CULTURE: Culture: NO GROWTH

## 2017-07-12 MED ORDER — NOREPINEPHRINE BITARTRATE 1 MG/ML IV SOLN
INTRAVENOUS | Status: AC
  Filled 2017-07-12: qty 4

## 2017-07-12 MED ORDER — SODIUM CHLORIDE 0.9% FLUSH
10.0000 mL | Freq: Two times a day (BID) | INTRAVENOUS | Status: DC
  Administered 2017-07-13 – 2017-07-14 (×3): 10 mL
  Administered 2017-07-14: 20 mL
  Administered 2017-07-15: 30 mL

## 2017-07-12 MED ORDER — SODIUM CHLORIDE 0.9% FLUSH: 10.0000 mL | INTRAVENOUS | Status: DC | PRN

## 2017-07-12 MED ORDER — IPRATROPIUM BROMIDE 0.02 % IN SOLN
0.5000 mg | Freq: Four times a day (QID) | RESPIRATORY_TRACT | Status: DC
  Administered 2017-07-12 – 2017-07-14 (×7): 0.5 mg via RESPIRATORY_TRACT
  Filled 2017-07-12 (×7): qty 2.5

## 2017-07-12 MED ORDER — ALBUTEROL SULFATE (2.5 MG/3ML) 0.083% IN NEBU: 3.0000 mL | INHALATION_SOLUTION | RESPIRATORY_TRACT | Status: DC | PRN

## 2017-07-12 MED ORDER — CHLORHEXIDINE GLUCONATE CLOTH 2 % EX PADS
6.0000 | MEDICATED_PAD | Freq: Every day | CUTANEOUS | Status: DC
  Administered 2017-07-13 – 2017-07-14 (×2): 6 via TOPICAL

## 2017-07-12 MED ORDER — IPRATROPIUM BROMIDE 0.02 % IN SOLN
RESPIRATORY_TRACT | Status: AC
  Administered 2017-07-12: 0.5 mg
  Filled 2017-07-12: qty 2.5

## 2017-07-12 MED ORDER — LEVALBUTEROL HCL 0.63 MG/3ML IN NEBU
0.6300 mg | INHALATION_SOLUTION | Freq: Four times a day (QID) | RESPIRATORY_TRACT | Status: DC
  Administered 2017-07-12 – 2017-07-14 (×7): 0.63 mg via RESPIRATORY_TRACT
  Filled 2017-07-12 (×7): qty 3

## 2017-07-12 MED ORDER — ORAL CARE MOUTH RINSE
15.0000 mL | Freq: Two times a day (BID) | OROMUCOSAL | Status: DC
  Administered 2017-07-13 – 2017-07-15 (×5): 15 mL via OROMUCOSAL

## 2017-07-12 NOTE — Consult Note (Signed)
Consult requested by: [facility] hospitalist Dr. Clementeen Graham Consult requested for: Respiratory failure septic shock  HPI: This is an 84 year old with multiple medical problems including chronic A. fib on chronic anticoagulation, chronic kidney disease hypertension hypothyroidism diabetes and COPD and coronary artery occlusive disease.  He came to the emergency room after being found down on the floor at home.  He was hypotensive.  He has had trouble with anemia and with low blood pressure at baseline.  He was discharged from the hospital after being in the hospital 3/6-3/11with the anemia and was discharged to skilled care facility but left after 4 days.  He had trouble with falling at home and then on the day of admission social worker visited his house and found him on the floor.  When he was brought to the emergency department he was hypotensive did not respond well to fluid bolus and was started on pressors.  Chest x-ray which I have personally reviewed shows what appears to be pneumonia and he is being treated for healthcare associated pneumonia.  He says he has COPD at baseline and he uses a rescue inhaler as well as a maintenance inhaler at home.  He is denying any chest pain now.  He is coughing and coughing up sputum.  No nausea vomiting or diarrhea.  No blood in the stool.  He was hypothermic in the emergency department as well.  CK was 1600s suggesting rhabdomyolysis  Past Medical History:  Diagnosis Date  . Atrial fibrillation ([place])    a. s/p DCCV in 12/2016, remains on Coumadin and Amiodarone  . CAD (coronary artery disease)    a. s/p POBA of D1 in 2009 with residual 20-30% stenosis along RCA and LAD  . Chronic kidney disease (CKD), stage III (moderate) (HCC)   . Dysrhythmia    AFib  . Essential hypertension, benign   . History of cardiac catheterization    Details not certain - reportedly no interventions  . Hypothyroidism   . Macular degeneration   . Pulmonary hypertension ([place]) 12/03/2013    PA pressure 56. Ejection fraction 55-60%  . Type 2 diabetes mellitus (HCC)      Family History  Problem Relation Age of Onset  . Hypertension Mother   . Hypertension Father   . Colon cancer Neg Hx      Social History   Socioeconomic History  . Marital status: Single    Spouse name: Not on file  . Number of children: Not on file  . Years of education: Not on file  . Highest education level: Not on file  Occupational History  . Not on file  Social Needs  . Financial resource strain: Not on file  . Food insecurity:    Worry: Not on file    Inability: Not on file  . Transportation needs:    Medical: Not on file    Non-medical: Not on file  Tobacco Use  . Smoking status: Former Smoker    Packs/day: 1.00    Years: 37.00    Pack years: 37.00    Types: Cigarettes    Start date: 11/01/1952    Last attempt to quit: 08/18/1978    Years since quitting: 38.9  . Smokeless tobacco: Never Used  Substance and Sexual Activity  . Alcohol use: Yes    Alcohol/week: 0.0 oz    Comment: Occasional  . Drug use: No  . Sexual activity: Not Currently  Lifestyle  . Physical activity:    Days per week: Not on file  Minutes per session: Not on file  . Stress: Not on file  Relationships  . Social connections:    Talks on phone: Not on file    Gets together: Not on file    Attends religious service: Not on file    Active member of club or organization: Not on file    Attends meetings of clubs or organizations: Not on file    Relationship status: Not on file  Other Topics Concern  . Not on file  Social History Narrative  . Not on file     ROS: Except as mentioned 10 point review of systems is negative    Objective: Vital signs in last 24 hours: Temp:  [92.7 F (33.7 C)-98.9 F (37.2 C)] 96.8 F (36 C) (03/30 0750) Pulse Rate:  [58-79] 79 (03/30 0750) Resp:  [12-29] 18 (03/30 0750) BP: (57-137)/(42-104) 137/104 (03/30 0700) SpO2:  [86 %-100 %] 97 % (03/30 0750) Weight:   [86.2 kg (190 lb)-89.6 kg (197 lb 8.5 oz)] 88.1 kg (194 lb 3.6 oz) (03/30 0400) Weight change:     Intake/Output from previous day: 03/29 0701 - 03/30 0700 In: 2434.1 [I.V.:234.1; IV Piggyback:2200] Out: 500 [Urine:500]  PHYSICAL EXAM Constitutional: He is awake and alert.  He is wearing nasal oxygen.  Eyes: Pupils react.  EOMI.  Ears nose mouth and throat: Mucous membranes are dry.  Hearing grossly normal.  Throat is clear.  Cardiovascular: His heart is irregular.  I do not hear a gallop.  Respiratory: He has mildly increased respiratory effort and has bilateral rhonchi and wheezing.  Gastrointestinal: His abdomen is soft with no masses.  Skin: Skin turgor is poor.  Neurological: No focal abnormalities.  Musculoskeletal: He is globally weak psychiatric: Normal mood and affect  Lab Results: Basic Metabolic Panel: Recent Labs    07/11/17 1235 07/11/17 1256 07/11/17 1746 07/12/17 0423  NA 134* 136  --  136  K 3.8 3.8  --  4.9  CL 101 98*  --  102  CO2 22  --   --  16*  GLUCOSE 81 76  --  117*  BUN 15 13  --  18  CREATININE 1.58* 1.60*  --  1.75*  CALCIUM 7.8*  --   --  7.5*  MG  --   --  1.4*  --    Liver Function Tests: Recent Labs    07/11/17 1235 07/12/17 0423  AST 87* 100*  ALT 28 32  ALKPHOS 78 77  BILITOT 1.6* 1.4*  PROT 5.1* 5.2*  ALBUMIN 2.7* 2.7*   No results for input(s): LIPASE, AMYLASE in the last 72 hours. Recent Labs    07/11/17 1618  AMMONIA 45*   CBC: Recent Labs    07/11/17 1235  07/11/17 1746 07/12/17 0423  WBC 4.1  --  3.2* 3.8*  NEUTROABS 2.7  --  2.3  --   HGB 9.9*   < > 9.3* 9.7*  HCT 30.2*   < > 28.0* 30.4*  MCV 88.0  --  88.6 90.2  PLT 134*  --  134* 188   < > = values in this interval not displayed.   Cardiac Enzymes: Recent Labs    07/11/17 1235 07/11/17 1240  CKTOTAL  --  1,488*  TROPONINI 0.03*  --    BNP: No results for input(s): PROBNP in the last 72 hours. D-Dimer: No results for input(s): DDIMER in the last 72  hours. CBG: Recent Labs    07/11/17 1644 07/11/17 1725 07/11/17  2012 07/12/17 0022 07/12/17 0459 07/12/17 0746  GLUCAP 74 73 85 95 113* 127*   Hemoglobin A1C: No results for input(s): HGBA1C in the last 72 hours. Fasting Lipid Panel: No results for input(s): CHOL, HDL, LDLCALC, TRIG, CHOLHDL, LDLDIRECT in the last 72 hours. Thyroid Function Tests: No results for input(s): TSH, T4TOTAL, FREET4, T3FREE, THYROIDAB in the last 72 hours. Anemia Panel: No results for input(s): VITAMINB12, FOLATE, FERRITIN, TIBC, IRON, RETICCTPCT in the last 72 hours. Coagulation: Recent Labs    07/11/17 1235 07/11/17 1746  LABPROT 16.8* 18.4*  INR 1.38 1.54   Urine Drug Screen: Drugs of Abuse  No results found for: LABOPIA, COCAINSCRNUR, LABBENZ, AMPHETMU, THCU, LABBARB  Alcohol Level: No results for input(s): ETH in the last 72 hours. Urinalysis: Recent Labs    07/11/17 1229  COLORURINE AMBER*  LABSPEC 1.018  PHURINE 5.0  GLUCOSEU NEGATIVE  HGBUR NEGATIVE  BILIRUBINUR NEGATIVE  KETONESUR 5*  PROTEINUR NEGATIVE  NITRITE NEGATIVE  LEUKOCYTESUR NEGATIVE   Misc. Labs:   ABGS: Recent Labs    07/11/17 1256 07/11/17 1625  PHART  --  7.293*  PO2ART  --  85.5  TCO2 27  --   HCO3  --  21.5     MICROBIOLOGY: Recent Results (from the past 240 hour(s))  Blood Culture (routine x 2)     Status: None (Preliminary result)   Collection Time: 07/11/17 12:41 PM  Result Value Ref Range Status   Specimen Description BLOOD RIGHT ARM  Final   Special Requests   Final    BOTTLES DRAWN AEROBIC AND ANAEROBIC Blood Culture adequate volume   Culture   Final    NO GROWTH < 24 HOURS Performed at [facility], [address] [place], [place] [address]    Report Status PENDING  Incomplete  Blood Culture (routine x 2)     Status: None (Preliminary result)   Collection Time: 07/11/17 12:44 PM  Result Value Ref Range Status   Specimen Description BLOOD RIGHT HAND  Final   Special Requests    Final    BOTTLES DRAWN AEROBIC AND ANAEROBIC Blood Culture adequate volume   Culture   Final    NO GROWTH < 24 HOURS Performed at [facility], [address] [place], [place] [address]    Report Status PENDING  Incomplete  MRSA PCR Screening     Status: None   Collection Time: 07/11/17  5:00 PM  Result Value Ref Range Status   MRSA by PCR NEGATIVE NEGATIVE Final    Comment:        The GeneXpert MRSA Assay (FDA approved for NASAL specimens only), is one component of a comprehensive MRSA colonization surveillance program. It is not intended to diagnose MRSA infection nor to guide or monitor treatment for MRSA infections. Performed at [facility], [address] [place], [place] [address]     Studies/Results: Ct Head Wo Contrast  Result Date: 07/11/2017 CLINICAL DATA:  Multiple falls today. EXAM: CT HEAD WITHOUT CONTRAST TECHNIQUE: Contiguous axial images were obtained from the base of the skull through the vertex without intravenous contrast. COMPARISON:  06/18/2017 FINDINGS: Brain: Stable age related cerebral atrophy, ventriculomegaly and periventricular white matter disease. No extra-axial fluid collections are identified. No CT findings for acute hemispheric infarction or intracranial hemorrhage. No mass lesions. The brainstem and cerebellum are normal. Vascular: No hyperdense vessel or unexpected calcification. Skull: No skull fracture or bone lesion. Sinuses/Orbits: Ethmoid and maxillary sinus disease. The frontal sinuses are clear and the mastoid  air cells and middle ear cavities are clear. Other: No scalp lesion or hematoma. IMPRESSION: 1. Age related cerebral atrophy, ventriculomegaly and periventricular white matter disease. 2. No acute intracranial findings or skull fracture. 3. Maxillary and ethmoid sinus disease. Electronically Signed   By: Marijo Sanes M.D.   On: 07/11/2017 13:38   Dg Chest Port 1 View  Result Date: 07/11/2017 CLINICAL DATA:  Weakness, hypotension,  coronary artery disease. EXAM: PORTABLE CHEST 1 VIEW COMPARISON:  Chest x-rays dated 06/18/2017 and 06/01/2017. FINDINGS: New opacity at the medial aspects of the right lung base, pneumonia versus asymmetric edema. There is mild bilateral interstitial prominence suggesting interstitial edema. Stable cardiomegaly. IMPRESSION: 1. Cardiomegaly with interstitial thickening, presumed interstitial edema, suggesting mild CHF/volume overload. 2. More confluent opacity at the medial right lung base could represent asymmetric pulmonary edema or pneumonia, favor edema if afebrile. Electronically Signed   By: Franki Cabot M.D.   On: 07/11/2017 13:24   Dg Chest Port 1v Same Day  Result Date: 07/11/2017 CLINICAL DATA:  Central line placement. EXAM: PORTABLE CHEST 1 VIEW COMPARISON:  07/11/2017 at 12:58 p.m. FINDINGS: Interval placement of right IJ central venous catheter with tip over the SVC. Lungs are adequately inflated with mild prominence of the perihilar markings suggesting mild vascular congestion. No pneumothorax. Improved hazy opacification over the right base. No evidence of effusion. Mild stable cardiomegaly. Remainder of the exam is unchanged. IMPRESSION: Findings suggesting mild vascular congestion with mild stable cardiomegaly. Right IJ central venous catheter with tip over the SVC. No pneumothorax. Electronically Signed   By: Marin Olp M.D.   On: 07/11/2017 15:58    Medications:  Prior to Admission:  Medications Prior to Admission  Medication Sig Dispense Refill Last Dose  . albuterol (PROVENTIL HFA;VENTOLIN HFA) 108 (90 Base) MCG/ACT inhaler Inhale 1-2 puffs into the lungs every 6 (six) hours as needed for wheezing or shortness of breath.   07/10/2017 at Unknown time  . b complex vitamins tablet Take 1 tablet by mouth daily.   07/10/2017 at Unknown time  . diphenhydrAMINE (BENADRYL) 12.5 MG/5ML elixir Take 50 mg by mouth at bedtime as needed for allergies or sleep.   07/10/2017 at Unknown time  .  ferrous sulfate 325 (65 FE) MG tablet Take 1 tablet (325 mg total) by mouth daily. 30 tablet 1 07/10/2017 at Unknown time  . furosemide (LASIX) 20 MG tablet Take 20 mg daily as needed for leg swelling 60 tablet 3 07/10/2017 at Unknown time  . levothyroxine (SYNTHROID, LEVOTHROID) 50 MCG tablet Take 1 tablet by mouth daily.   07/10/2017 at Unknown time  . MELATONIN PO Take 4 mg by mouth at bedtime. 2 mg tablets   07/10/2017 at Unknown time  . midodrine (PROAMATINE) 2.5 MG tablet Take 1 tablet (2.5 mg total) by mouth 3 (three) times daily with meals.   07/10/2017 at Unknown time  . pantoprazole (PROTONIX) 40 MG tablet Take 1 tablet (40 mg total) by mouth daily. 30 tablet 0 07/10/2017 at Unknown time  . Tiotropium Bromide-Olodaterol (STIOLTO RESPIMAT) 2.5-2.5 MCG/ACT AERS Inhale 1 puff into the lungs daily.   07/10/2017 at Unknown time   Scheduled: . B-complex with vitamin C  1 tablet Oral Daily  . enoxaparin (LOVENOX) injection  40 mg Subcutaneous Q24H  . ferrous sulfate  325 mg Oral Daily  . hydrocortisone sod succinate (SOLU-CORTEF) inj  100 mg Intravenous Q8H  . insulin aspart  0-9 Units Subcutaneous Q4H  . ipratropium  0.5 mg Nebulization Q6H WA  .  levalbuterol  0.63 mg Nebulization Q6H WA  . levothyroxine  25 mcg Intravenous QAC breakfast  . levothyroxine  50 mcg Oral QAC breakfast  . midodrine  2.5 mg Oral TID WC  . pantoprazole (PROTONIX) IV  40 mg Intravenous Q24H  . sodium chloride flush  3 mL Intravenous Q12H   Continuous: . ceFEPime (MAXIPIME) IV Stopped (07/11/17 2138)  . norepinephrine (LEVOPHED) Adult infusion 5 mcg/min (07/12/17 0600)  . vancomycin     TWS:[id number] **OR** acetaminophen, albuterol, ondansetron **OR** ondansetron (ZOFRAN) IV  Assesment: He was admitted with septic shock and is still on pressors although his blood pressure looks a little better.  He has healthcare associated pneumonia and he is being treated for that.  He is clinically improved  He has  rhabdomyolysis and transaminitis likely from being on the floor for an unknown period of time.  Transaminitis looks better  He has chronic diastolic heart failure and has been requiring fluid resuscitation for this septic shock.  He is on intravenous steroids and I agree that is going to be needed  At baseline he has pulmonary hypertension  He has chronic kidney disease and his creatinine has gone up  He obviously has multisystem disease and is still critically ill.  He does have DNR status. Principal Problem:   Septic shock ([place]) Active Problems:   Type 2 diabetes mellitus with diabetic nephropathy (HCC)   Chronic kidney disease (CKD), stage III (moderate) (HCC)   Hypothyroidism   Transaminitis   Pulmonary hypertension (HCC)   Chronic diastolic heart failure (HCC)   A-fib (HCC)   HCAP (healthcare-associated pneumonia)   Rhabdomyolysis    Plan: Continue current antibiotics steroids inhaled bronchodilators continue with pressors as needed.  Thanks for allowing me to see him with you      LOS: 1 day   Mykai Wendorf L 07/12/2017, 9:14 AM

## 2017-07-12 NOTE — Progress Notes (Signed)
PROGRESS NOTE                                                                                                                                                                                                             Patient Demographics:    Brett [name], is a 84 y.o. male, DOB - 10/10/1932, ZOX:[id number]  Admit date - 07/11/2017   Admitting Physician Louellen Molder, MD  Outpatient Primary MD for the patient is [name], MD  LOS - 1  Outpatient Specialists: [facility] cardiology  Chief Complaint  Patient presents with  . Weakness       Brief Narrative   Please refer to admission H&P for details, in brief, 84 year old male with A. fib on chronic anticoagulation, CKD stage III, hypertension, hypothyroidism, diabetes mellitus with recent hospitalization about 2 weeks back with hypotension and weakness with blood pressure dropping to 60s per EMS and improved with fluids.  He also had significant anemia requiring almost 6 unit PRBC along with FFP.  EGD done showed nonbleeding erosive gastropathy and diverticulosis on colonoscopy.  Coumadin was discontinued upon discharge.  Patient sent to SNF but signed himself out few days prior to this admission.  He was living with his girlfriend and per his son had few episodes of fall at home and also did not refill his prescription. On the morning of admission social worker visited his place for evaluation and found him on the floor for unknown duration and patient also not communicating well.  In the ED patient was hypotensive with blood pressure of 71/53 mmHg, hypothermic with temperature of 92.7 F.  Hemoglobin at baseline along with normal chemistry and negative Hemoccult.  He had CK of 1600 and BNP of 470.  Chest x-ray concerning for pneumonia .  Patient received 3 L normal saline bolus in the ED without much improvement. Patient admitted to ICU for septic shock on empiric antibiotics, IV  hydrocortisone and Levophed drip. Hypothermia protocol initiated in the ED.   Subjective:   Patient is better communicative today but still has confusion.  Blood pressure better on Levophed drip (5 ugs per min).     Assessment  & Plan :    Principal Problem:   Septic shock ([place]) Probably due to healthcare associated pneumonia.  Continue ICU monitoring with empiric vancomycin and cefepime.  Sats stable on 2  L O2.  Continue Levophed drip along with IV hydrocortisone.   Critical care consult appreciated.  Keep n.p.o. Dr. Luan Pulling following.  Appreciate recommendations.  Active Problems:   HCAP (healthcare-associated pneumonia) Empiric antibiotics.  Follow cultures including strep pneumonia antigen and Legionella antigen. Keep n.p.o. until better oriented.  Will need swallow evaluation. Added nebs.  Acute toxic metabolic encephalopathy Secondary to septic shock.  Still confused but more awake and oriented from yesterday.  Check EEG to rule out seizures.  CT without acute findings.  Follow cultures.    Type 2 diabetes mellitus with diabetic nephropathy (HCC) R on sliding scale coverage.    Chronic kidney disease (CKD), stage III (moderate) (HCC) Stable at baseline    Hypothyroidism TSH stable.  Continue Synthroid..    Transaminitis Elevated AST.  Possibly from sepsis.  Monitor for now.     Chronic diastolic heart failure (HCC) No signs of fluid overload.   hold all blood pressure medications.  Rhabdomyolysis. Possibly from being on the floor for several hours.  CPK improving.  Check EEG to rule out seizures given recurrent fall at home recently.  Paroxysmal A-fib (HCC) Paroxysmal A. fib (HCC) Recently taken off Coumadin for blood loss anemia.  Amiodarone discontinued by cardiology during outpatient visit on 3/28.  Of beta-blocker.  COPD Has some wheezing bilaterally added nebs.  Iron deficiency anemia Resume supplement when tolerated.  Prolonged QTC Avoid QT prolonging  agents.  Replenished low magnesium.         Code Status : DNR  Family Communication  : Discussed with son on admission  Disposition Plan  : Continue ICU monitoring for now  Barriers For Discharge : Active symptoms  Consults  : Critical care  Procedures  : EEG  DVT Prophylaxis  :  Lovenox -  Lab Results  Component Value Date   PLT 188 07/12/2017    Antibiotics  :    Anti-infectives (From admission, onward)   Start     Dose/Rate Route Frequency Ordered Stop   07/12/17 1400  vancomycin (VANCOCIN) 1,250 mg in sodium chloride 0.9 % 250 mL IVPB     1,250 mg 166.7 mL/hr over 90 Minutes Intravenous Every 24 hours 07/11/17 1328     07/11/17 2200  piperacillin-tazobactam (ZOSYN) IVPB 3.375 g  Status:  Discontinued     3.375 g 12.5 mL/hr over 240 Minutes Intravenous Every 8 hours 07/11/17 1328 07/11/17 1746   07/11/17 2000  ceFEPIme (MAXIPIME) 2 g in sodium chloride 0.9 % 100 mL IVPB     2 g 200 mL/hr over 30 Minutes Intravenous Every 24 hours 07/11/17 1746     07/11/17 1400  vancomycin (VANCOCIN) 2,000 mg in sodium chloride 0.9 % 500 mL IVPB     2,000 mg 250 mL/hr over 120 Minutes Intravenous  Once 07/11/17 1326 07/11/17 1720   07/11/17 1315  piperacillin-tazobactam (ZOSYN) IVPB 3.375 g     3.375 g 100 mL/hr over 30 Minutes Intravenous  Once 07/11/17 1307 07/11/17 1406   07/11/17 1315  vancomycin (VANCOCIN) IVPB 1000 mg/200 mL premix  Status:  Discontinued     1,000 mg 200 mL/hr over 60 Minutes Intravenous  Once 07/11/17 1307 07/11/17 1325        Objective:   Vitals:   07/12/17 0642 07/12/17 0700 07/12/17 0750 07/12/17 1103  BP:  (!) 137/104    Pulse:  78 79 76  Resp:  (!) 27 18 (!) 23  Temp:  (!) 97.2 F (36.2 C) (!) 96.8 F (36  C) (!) 96.3 F (35.7 C)  TempSrc:      SpO2: 100% 98% 97% 94%  Weight:      Height:        Wt Readings from Last 3 Encounters:  07/12/17 88.1 kg (194 lb 3.6 oz)  07/10/17 86.2 kg (190 lb)  06/21/17 88.9 kg (196 lb)      Intake/Output Summary (Last 24 hours) at 07/12/2017 1306 Last data filed at 07/12/2017 0600 Gross per 24 hour  Intake 2434.06 ml  Output 500 ml  Net 1934.06 ml     Physical Exam  Gen: Elderly male appears fatigued, awake and alert and answering few questions HEENT: n pallor present, pupils reactive bilaterally, EOMI, moist mucosa, supple neck Chest: Diffuse wheezing bilaterally CVS: S1 and S2 irregular, no murmurs GI: soft, NT, ND, BS+ Musculoskeletal: warm, no edema, Foley in place CNS: Awake and alert, oriented x1-2, nonfocal    Data Review:    CBC Recent Labs  Lab 07/11/17 1235 07/11/17 1256 07/11/17 1746 07/12/17 0423  WBC 4.1  --  3.2* 3.8*  HGB 9.9* 9.5* 9.3* 9.7*  HCT 30.2* 28.0* 28.0* 30.4*  PLT 134*  --  134* 188  MCV 88.0  --  88.6 90.2  MCH 28.9  --  29.4 28.8  MCHC 32.8  --  33.2 31.9  RDW 16.4*  --  16.5* 16.7*  LYMPHSABS 0.6*  --  0.5*  --   MONOABS 0.5  --  0.3  --   EOSABS 0.3  --  0.2  --   BASOSABS 0.0  --  0.0  --     Chemistries  Recent Labs  Lab 07/11/17 1235 07/11/17 1256 07/11/17 1746 07/12/17 0423  NA 134* 136  --  136  K 3.8 3.8  --  4.9  CL 101 98*  --  102  CO2 22  --   --  16*  GLUCOSE 81 76  --  117*  BUN 15 13  --  18  CREATININE 1.58* 1.60*  --  1.75*  CALCIUM 7.8*  --   --  7.5*  MG  --   --  1.4*  --   AST 87*  --   --  100*  ALT 28  --   --  32  ALKPHOS 78  --   --  77  BILITOT 1.6*  --   --  1.4*   ------------------------------------------------------------------------------------------------------------------ No results for input(s): CHOL, HDL, LDLCALC, TRIG, CHOLHDL, LDLDIRECT in the last 72 hours.  Lab Results  Component Value Date   HGBA1C 4.7 (L) 06/18/2017   ------------------------------------------------------------------------------------------------------------------ No results for input(s): TSH, T4TOTAL, T3FREE, THYROIDAB in the last 72 hours.  Invalid input(s):  FREET3 ------------------------------------------------------------------------------------------------------------------ No results for input(s): VITAMINB12, FOLATE, FERRITIN, TIBC, IRON, RETICCTPCT in the last 72 hours.  Coagulation profile Recent Labs  Lab 07/11/17 1235 07/11/17 1746  INR 1.38 1.54    No results for input(s): DDIMER in the last 72 hours.  Cardiac Enzymes Recent Labs  Lab 07/11/17 1235  TROPONINI 0.03*   ------------------------------------------------------------------------------------------------------------------    Component Value Date/Time   BNP 471.0 (H) 07/11/2017 1240    Inpatient Medications  Scheduled Meds: . B-complex with vitamin C  1 tablet Oral Daily  . enoxaparin (LOVENOX) injection  40 mg Subcutaneous Q24H  . ferrous sulfate  325 mg Oral Daily  . hydrocortisone sod succinate (SOLU-CORTEF) inj  100 mg Intravenous Q8H  . insulin aspart  0-9 Units Subcutaneous Q4H  . ipratropium  0.5  mg Nebulization Q6H WA  . levalbuterol  0.63 mg Nebulization Q6H WA  . levothyroxine  25 mcg Intravenous QAC breakfast  . levothyroxine  50 mcg Oral QAC breakfast  . midodrine  2.5 mg Oral TID WC  . pantoprazole (PROTONIX) IV  40 mg Intravenous Q24H  . sodium chloride flush  3 mL Intravenous Q12H   Continuous Infusions: . ceFEPime (MAXIPIME) IV Stopped (07/11/17 2138)  . norepinephrine (LEVOPHED) Adult infusion 5 mcg/min (07/12/17 0600)  . vancomycin     PRN Meds:.acetaminophen **OR** acetaminophen, albuterol, ondansetron **OR** ondansetron (ZOFRAN) IV  Micro Results Recent Results (from the past 240 hour(s))  Blood Culture (routine x 2)     Status: None (Preliminary result)   Collection Time: 07/11/17 12:41 PM  Result Value Ref Range Status   Specimen Description BLOOD RIGHT ARM  Final   Special Requests   Final    BOTTLES DRAWN AEROBIC AND ANAEROBIC Blood Culture adequate volume   Culture   Final    NO GROWTH < 24 HOURS Performed at [facility], [address], [place], [place] [address]    Report Status PENDING  Incomplete  Blood Culture (routine x 2)     Status: None (Preliminary result)   Collection Time: 07/11/17 12:44 PM  Result Value Ref Range Status   Specimen Description BLOOD RIGHT HAND  Final   Special Requests   Final    BOTTLES DRAWN AEROBIC AND ANAEROBIC Blood Culture adequate volume   Culture   Final    NO GROWTH < 24 HOURS Performed at [facility], [address] [place], [place] [address]    Report Status PENDING  Incomplete  MRSA PCR Screening     Status: None   Collection Time: 07/11/17  5:00 PM  Result Value Ref Range Status   MRSA by PCR NEGATIVE NEGATIVE Final    Comment:        The GeneXpert MRSA Assay (FDA approved for NASAL specimens only), is one component of a comprehensive MRSA colonization surveillance program. It is not intended to diagnose MRSA infection nor to guide or monitor treatment for MRSA infections. Performed at [facility], [address], [place], [place] [address]     Radiology Reports Dg Chest 2 View  Result Date: 06/18/2017 CLINICAL DATA:  Pt brought in from PCP by EMS due to hypotension. Pt was to be seen due dizziness and falls for weeks. Pt seen here and placed on iron pills. BP in PCP office initially 68/40 . EMS reports 90/62. Denies pain. Pt reports dizzine.*comment was truncated* EXAM: CHEST - 2 VIEW COMPARISON:  06/08/2017 FINDINGS: Normal cardiac silhouette. Lungs are mildly hyperinflated. No effusion, infiltrate or pneumothorax. Calcified pulmonary nodules unchanged from prior. IMPRESSION: No acute cardiopulmonary process. Electronically Signed   By: Suzy Bouchard M.D.   On: 06/18/2017 12:48   Ct Head Wo Contrast  Result Date: 07/11/2017 CLINICAL DATA:  Multiple falls today. EXAM: CT HEAD WITHOUT CONTRAST TECHNIQUE: Contiguous axial images were obtained from the base of the skull through the vertex without intravenous contrast. COMPARISON:  06/18/2017 FINDINGS:  Brain: Stable age related cerebral atrophy, ventriculomegaly and periventricular white matter disease. No extra-axial fluid collections are identified. No CT findings for acute hemispheric infarction or intracranial hemorrhage. No mass lesions. The brainstem and cerebellum are normal. Vascular: No hyperdense vessel or unexpected calcification. Skull: No skull fracture or bone lesion. Sinuses/Orbits: Ethmoid and maxillary sinus disease. The frontal sinuses are clear and the mastoid air cells and middle ear cavities  are clear. Other: No scalp lesion or hematoma. IMPRESSION: 1. Age related cerebral atrophy, ventriculomegaly and periventricular white matter disease. 2. No acute intracranial findings or skull fracture. 3. Maxillary and ethmoid sinus disease. Electronically Signed   By: Marijo Sanes M.D.   On: 07/11/2017 13:38   Ct Head Wo Contrast  Result Date: 06/18/2017 CLINICAL DATA:  Head trauma.  Hypotension. Initial encounter. EXAM: CT HEAD WITHOUT CONTRAST TECHNIQUE: Contiguous axial images were obtained from the base of the skull through the vertex without intravenous contrast. COMPARISON:  None. FINDINGS: Brain: No evidence of acute infarction, hemorrhage, hydrocephalus, extra-axial collection or mass lesion/mass effect. Nonspecific atrophy with ventriculomegaly. Chronic small vessel ischemia in the periventricular white matter. Vascular: Atherosclerotic calcification.  No hyperdense vessel. Skull: Negative Sinuses/Orbits: No acute finding.  Left cataract resection. IMPRESSION: No evidence of intracranial injury. Electronically Signed   By: Monte Fantasia M.D.   On: 06/18/2017 12:09   Dg Chest Port 1 View  Result Date: 07/11/2017 CLINICAL DATA:  Weakness, hypotension, coronary artery disease. EXAM: PORTABLE CHEST 1 VIEW COMPARISON:  Chest x-rays dated 06/18/2017 and 06/01/2017. FINDINGS: New opacity at the medial aspects of the right lung base, pneumonia versus asymmetric edema. There is mild bilateral  interstitial prominence suggesting interstitial edema. Stable cardiomegaly. IMPRESSION: 1. Cardiomegaly with interstitial thickening, presumed interstitial edema, suggesting mild CHF/volume overload. 2. More confluent opacity at the medial right lung base could represent asymmetric pulmonary edema or pneumonia, favor edema if afebrile. Electronically Signed   By: Franki Cabot M.D.   On: 07/11/2017 13:24   Dg Chest Port 1v Same Day  Result Date: 07/11/2017 CLINICAL DATA:  Central line placement. EXAM: PORTABLE CHEST 1 VIEW COMPARISON:  07/11/2017 at 12:58 p.m. FINDINGS: Interval placement of right IJ central venous catheter with tip over the SVC. Lungs are adequately inflated with mild prominence of the perihilar markings suggesting mild vascular congestion. No pneumothorax. Improved hazy opacification over the right base. No evidence of effusion. Mild stable cardiomegaly. Remainder of the exam is unchanged. IMPRESSION: Findings suggesting mild vascular congestion with mild stable cardiomegaly. Right IJ central venous catheter with tip over the SVC. No pneumothorax. Electronically Signed   By: Marin Olp M.D.   On: 07/11/2017 15:58    Time Spent in minutes  35   Xianna Siverling M.D on 07/12/2017 at 1:06 PM  Between 7am to 7pm - Pager - [phone]  After 7pm go to [email or web address] - password [facility]  [facility] -  Office  [phone]

## 2017-07-13 DIAGNOSIS — G92 Toxic encephalopathy: Secondary | ICD-10-CM

## 2017-07-13 LAB — MAGNESIUM: MAGNESIUM: 2 mg/dL (ref 1.7–2.4)

## 2017-07-13 LAB — CBC
HEMATOCRIT: 28.8 % — AB (ref 39.0–52.0)
HEMOGLOBIN: 9.4 g/dL — AB (ref 13.0–17.0)
MCH: 28.4 pg (ref 26.0–34.0)
MCHC: 32.6 g/dL (ref 30.0–36.0)
MCV: 87 fL (ref 78.0–100.0)
Platelets: 179 10*3/uL (ref 150–400)
RBC: 3.31 MIL/uL — AB (ref 4.22–5.81)
RDW: 16.8 % — ABNORMAL HIGH (ref 11.5–15.5)
WBC: 3.7 10*3/uL — ABNORMAL LOW (ref 4.0–10.5)

## 2017-07-13 LAB — COMPREHENSIVE METABOLIC PANEL
ALK PHOS: 77 U/L (ref 38–126)
ALT: 34 U/L (ref 17–63)
AST: 80 U/L — ABNORMAL HIGH (ref 15–41)
Albumin: 2.9 g/dL — ABNORMAL LOW (ref 3.5–5.0)
Anion gap: 16 — ABNORMAL HIGH (ref 5–15)
BILIRUBIN TOTAL: 1.1 mg/dL (ref 0.3–1.2)
BUN: 28 mg/dL — AB (ref 6–20)
CALCIUM: 7.9 mg/dL — AB (ref 8.9–10.3)
CO2: 18 mmol/L — ABNORMAL LOW (ref 22–32)
CREATININE: 1.66 mg/dL — AB (ref 0.61–1.24)
Chloride: 101 mmol/L (ref 101–111)
GFR, EST AFRICAN AMERICAN: 43 mL/min — AB (ref >60)
GFR, EST NON AFRICAN AMERICAN: 37 mL/min — AB (ref >60)
Glucose, Bld: 117 mg/dL — ABNORMAL HIGH (ref 65–99)
Potassium: 4.6 mmol/L (ref 3.5–5.1)
Sodium: 135 mmol/L (ref 135–145)
Total Protein: 5.6 g/dL — ABNORMAL LOW (ref 6.5–8.1)

## 2017-07-13 LAB — GLUCOSE, CAPILLARY
GLUCOSE-CAPILLARY: 96 mg/dL (ref 65–99)
GLUCOSE-CAPILLARY: 178 mg/dL — AB (ref 65–99)
GLUCOSE-CAPILLARY: 165 mg/dL — AB (ref 65–99)
Glucose-Capillary: 122 mg/dL — ABNORMAL HIGH (ref 65–99)
Glucose-Capillary: 122 mg/dL — ABNORMAL HIGH (ref 65–99)

## 2017-07-13 MED ORDER — DIPHENHYDRAMINE HCL 25 MG PO CAPS
50.0000 mg | ORAL_CAPSULE | Freq: Once | ORAL | Status: AC
  Administered 2017-07-13: 50 mg via ORAL
  Filled 2017-07-13: qty 2

## 2017-07-13 MED ORDER — ZOLPIDEM TARTRATE 5 MG PO TABS
5.0000 mg | ORAL_TABLET | Freq: Every evening | ORAL | Status: DC | PRN
  Administered 2017-07-14 – 2017-07-15 (×2): 5 mg via ORAL
  Filled 2017-07-13 (×2): qty 1

## 2017-07-13 NOTE — Evaluation (Signed)
Clinical/Bedside Swallow Evaluation Patient Details  Name: Brett [name] MRN: [id number] Date of Birth: 1933/02/25  Today's Date: 07/13/2017 Time: SLP Start Time (ACUTE ONLY): 1115 SLP Stop Time (ACUTE ONLY): 1139 SLP Time Calculation (min) (ACUTE ONLY): 24 min  Past Medical History:  Past Medical History:  Diagnosis Date  . Atrial fibrillation ([place])    a. s/p DCCV in 12/2016, remains on Coumadin and Amiodarone  . CAD (coronary artery disease)    a. s/p POBA of D1 in 2009 with residual 20-30% stenosis along RCA and LAD  . Chronic kidney disease (CKD), stage III (moderate) (HCC)   . Dysrhythmia    AFib  . Essential hypertension, benign   . History of cardiac catheterization    Details not certain - reportedly no interventions  . Hypothyroidism   . Macular degeneration   . Pulmonary hypertension ([place]) 12/03/2013   PA pressure 56. Ejection fraction 55-60%  . Type 2 diabetes mellitus ([place])    Past Surgical History:  Past Surgical History:  Procedure Laterality Date  . CARDIOVERSION N/A 12/10/2013   Procedure: CARDIOVERSION;  Surgeon: Herminio Commons, MD;  Location: [facility];  Service: Endoscopy;  Laterality: N/A;  . CARDIOVERSION N/A 11/15/2016   Procedure: CARDIOVERSION;  Surgeon: Arnoldo Lenis, MD;  Location: [facility];  Service: Endoscopy;  Laterality: N/A;  . CARDIOVERSION N/A 12/18/2016   Procedure: CARDIOVERSION;  Surgeon: Arnoldo Lenis, MD;  [facility];  Service: Endoscopy;  Laterality: N/A;  pt knows to arrive at 8:00  . CATARACT EXTRACTION W/PHACO Left 11/13/2015   Procedure: CATARACT EXTRACTION PHACO AND INTRAOCULAR LENS PLACEMENT LEFT EYE CDE=7.76;  Surgeon: Williams Che, MD;  Location: [facility];  Service: Ophthalmology;  Laterality: Left;  . CHOLECYSTECTOMY    . COLONOSCOPY     Remote > 10 years ago  . COLONOSCOPY WITH PROPOFOL N/A 06/21/2017   Procedure: COLONOSCOPY WITH PROPOFOL;  Surgeon: Clarene Essex, MD;  Location: [place] [place];  Service:  Endoscopy;  Laterality: N/A;  . ERCP N/A 10/17/2016   Dr. Gala Romney: sphincterotomy and stone extraction   . ESOPHAGOGASTRODUODENOSCOPY (EGD) WITH PROPOFOL N/A 06/21/2017   Procedure: ESOPHAGOGASTRODUODENOSCOPY (EGD) WITH PROPOFOL;  Surgeon: Clarene Essex, MD;  Location: [place];  Service: Endoscopy;  Laterality: N/A;  . REMOVAL OF STONES  10/17/2016   Procedure: REMOVAL OF STONES with balloon;  Surgeon: Daneil Dolin, MD;  [facility];  Service: Endoscopy;;  . REPLACEMENT TOTAL HIP W/  RESURFACING IMPLANTS Bilateral   . SPHINCTEROTOMY  10/17/2016   Procedure: BILLARY SPHINCTEROTOMY with ballon dilation;  Surgeon: Daneil Dolin, MD;  [facility];  Service: Endoscopy;;  . TEE WITHOUT CARDIOVERSION N/A 12/10/2013   Procedure: TRANSESOPHAGEAL ECHOCARDIOGRAM (TEE);  Surgeon: Herminio Commons, MD;  Location: [facility];  Service: Endoscopy;  Laterality: N/A;   HPI:  Please refer to admission H&P for details, in brief, 84 year old male with A. fib on chronic anticoagulation, CKD stage III, hypertension, hypothyroidism, diabetes mellitus with recent hospitalization about 2 weeks back with hypotension and weakness with blood pressure dropping to 60s per EMS and improved with fluids.  He also had significant anemia requiring almost 6 unit PRBC along with FFP.  EGD done showed nonbleeding erosive gastropathy and diverticulosis on colonoscopy.  Coumadin was discontinued upon discharge.  Patient sent to SNF but signed himself out few days prior to this admission.  He was living with his girlfriend and per his son had few episodes of fall at home and also  did not refill his prescription. BSE requested. Pt had EGD 06/21/17   Assessment / Plan / Recommendation Clinical Impression  Clinical swallow evaluation completed while Pt sitting up in chair in room. Pt denies difficulty swallowing, but does endorse COPD with significant coughing "all my life". Pt presented with congested cough prior to PO  (however has been sipping on water). Oral motor evaluation essentially WNL with mild xerostomia likely due to NPO status. Pt self presented water, applesauce, and graham crackers with hand over hand assist initially. Pt with throat clearing and delayed coughing following "dry" graham crackers. Pt then endorsed some "strangling" with dry, crumbly foods in recent past. Will initiate D2/chopped and thin liquids with supervision with meals. Consider MBSS given h/o of COPD and coughing with possible difficulty with swallow/respiration reciprocity. Pt fairly adamant that he doesn't need and is anxious to be discharged today so that he "can get married on [employer] private jet later this afternoon".  SLP will follow during acute stay. Above to RN.   SLP Visit Diagnosis: Dysphagia, unspecified (R13.10)    Aspiration Risk  Mild aspiration risk    Diet Recommendation Dysphagia 2 (Fine chop);Thin liquid   Liquid Administration via: Cup;Straw Medication Administration: Whole meds with liquid Supervision: Patient able to self feed;Intermittent supervision to cue for compensatory strategies Compensations: Small sips/bites;Slow rate Postural Changes: Seated upright at 90 degrees;Remain upright for at least 30 minutes after po intake    Other  Recommendations Oral Care Recommendations: Oral care BID;Staff/trained caregiver to provide oral care Other Recommendations: Clarify dietary restrictions   Follow up Recommendations Skilled Nursing facility(pending)      Frequency and Duration min 2x/week  1 week       Prognosis Prognosis for Safe Diet Advancement: Fair Barriers to Reach Goals: (Pt denies difficulty swallowing)      Swallow Study   General Date of Onset: 07/11/17 HPI: Please refer to admission H&P for details, in brief, 84 year old male with A. fib on chronic anticoagulation, CKD stage III, hypertension, hypothyroidism, diabetes mellitus with recent hospitalization about 2 weeks back with  hypotension and weakness with blood pressure dropping to 60s per EMS and improved with fluids.  He also had significant anemia requiring almost 6 unit PRBC along with FFP.  EGD done showed nonbleeding erosive gastropathy and diverticulosis on colonoscopy.  Coumadin was discontinued upon discharge.  Patient sent to SNF but signed himself out few days prior to this admission.  He was living with his girlfriend and per his son had few episodes of fall at home and also did not refill his prescription. BSE requested. Pt had EGD 06/21/17 Type of Study: Bedside Swallow Evaluation Previous Swallow Assessment: None on record Diet Prior to this Study: NPO Temperature Spikes Noted: No Respiratory Status: Room air History of Recent Intubation: No Behavior/Cognition: Alert;Cooperative;Pleasant mood Oral Cavity Assessment: Within Functional Limits;Dry Oral Care Completed by SLP: Yes Oral Cavity - Dentition: (upper partial) Vision: Functional for self-feeding Self-Feeding Abilities: Able to feed self Patient Positioning: Upright in chair Baseline Vocal Quality: Normal;Low vocal intensity Volitional Cough: Congested Volitional Swallow: Able to elicit    Oral/Motor/Sensory Function Overall Oral Motor/Sensory Function: Within functional limits   Ice Chips Ice chips: Within functional limits Presentation: Spoon   Thin Liquid Thin Liquid: Within functional limits Presentation: Cup;Self Fed;Straw    Nectar Thick Nectar Thick Liquid: Not tested   Honey Thick Honey Thick Liquid: Not tested   Puree Puree: Within functional limits Presentation: Self Fed;Spoon   Solid   GO  Solid: Impaired Presentation: Self Fed Oral Phase Impairments: Impaired mastication Pharyngeal Phase Impairments: Multiple swallows;Throat Clearing - Immediate;Cough - Delayed       Thank you,  Genene Churn, [place]  [place] 07/13/2017,11:40 AM

## 2017-07-13 NOTE — Progress Notes (Signed)
He has improved substantially and is off pressors.  He did have some confusion.  Discussed with Dr. Clementeen Graham and considering how much better he is I will plan to sign off.  I will be happy to see him again if needed

## 2017-07-13 NOTE — Progress Notes (Signed)
Pt confused tonight. Oriented to time, location and self but confused about situation. Pt very adamant about leaving via transport to go home. Pt pulling at gown and cords but easily distracted. Pt became verbally aggressive with this RN.    Will continue to monitor Margaret Pyle, RN

## 2017-07-13 NOTE — Progress Notes (Addendum)
PROGRESS NOTE                                                                                                                                                                                                             Patient Demographics:    Brett [name], is a 84 y.o. male, DOB - Oct 06, 1932, KYH:[id number]  Admit date - 07/11/2017   Admitting Physician Louellen Molder, MD  Outpatient Primary MD for the patient is [name], MD  LOS - 2  Outpatient Specialists: [facility] cardiology  Chief Complaint  Patient presents with  . Weakness       Brief Narrative   Please refer to admission H&P for details, in brief, 84 year old male with A. fib on chronic anticoagulation, CKD stage III, hypertension, hypothyroidism, diabetes mellitus with recent hospitalization about 2 weeks back with hypotension and weakness with blood pressure dropping to 60s per EMS and improved with fluids.  He also had significant anemia requiring almost 6 unit PRBC along with FFP.  EGD done showed nonbleeding erosive gastropathy and diverticulosis on colonoscopy.  Coumadin was discontinued upon discharge.  Patient sent to SNF but signed himself out few days prior to this admission.  He was living with his girlfriend and per his son had few episodes of fall at home and also did not refill his prescription. On the morning of admission social worker visited his place for evaluation and found him on the floor for unknown duration and patient also not communicating well.  In the ED patient was hypotensive with blood pressure of 71/53 mmHg, hypothermic with temperature of 92.7 F.  Hemoglobin at baseline along with normal chemistry and negative Hemoccult.  He had CK of 1600 and BNP of 470.  Chest x-ray concerning for pneumonia .  Patient received 3 L normal saline bolus in the ED without much improvement. Patient admitted to ICU for septic shock on empiric antibiotics, IV  hydrocortisone and Levophed drip. Hypothermia protocol initiated in the ED.   Subjective:   More oriented this morning. Reportedly was confused last night and wanting to go home. Off levophed drip.   Assessment  & Plan :    Principal Problem:   Septic shock ([place]) Probably due to healthcare associated vs aspiration pneumonia.   Sepsis now resolved. Off levophed drip, d/c hydrocortisone. Critical care consult appreciated.   D/c  vancomycin and monitor on cefepime. Blood cx negative. Flu PCR negative. strep and legionella ag pending. Dr. Luan Pulling consult  recommendations.  Active Problems:   HCAP (healthcare-associated pneumonia) Possible aspiration cannot be ruled out. Has mild aspiration risk per SLP. treatment as outlined above. Prn nebs.  Acute toxic metabolic encephalopathy Secondary to septic shock.  More oriented today  Check EEG to rule out seizures.  CT head  without acute findings.  Follow cultures negative.    Type 2 diabetes mellitus with diabetic nephropathy (HCC)  on sliding scale coverage.    Chronic kidney disease (CKD), stage III (moderate) (HCC) Stable at baseline    Hypothyroidism TSH stable.  Continue Synthroid..    Transaminitis Elevated AST.  Possibly from sepsis.  Monitor for now.     Chronic diastolic heart failure (HCC) No signs of fluid overload.   holding all blood pressure medications.  Rhabdomyolysis. Possibly from being on the floor for several hours.  CPK improving.  Check EEG to rule out seizures given recurrent fall at home recently.   Paroxysmal A. fib ([place]) Recently taken off Coumadin for blood loss anemia.  Amiodarone discontinued by cardiology during outpatient visit on 3/28.  Of beta-blocker.  COPD Has some wheezing bilaterally added nebs.  Iron deficiency anemia Resume supplement when tolerated.  Prolonged QTC Avoid QT prolonging agents.  Replenished low magnesium.         Code Status : DNR  Family Communication  :  Discussed with son and daughter in law at bedside  Disposition Plan  : Transfer to medical floor in am  Barriers For Discharge : Active symptoms  Consults  : Critical care  Procedures  : EEG, head CT  DVT Prophylaxis  :  Lovenox -  Lab Results  Component Value Date   PLT 179 07/13/2017    Antibiotics  :    Anti-infectives (From admission, onward)   Start     Dose/Rate Route Frequency Ordered Stop   07/12/17 1400  vancomycin (VANCOCIN) 1,250 mg in sodium chloride 0.9 % 250 mL IVPB  Status:  Discontinued     1,250 mg 166.7 mL/hr over 90 Minutes Intravenous Every 24 hours 07/11/17 1328 07/13/17 1235   07/11/17 2200  piperacillin-tazobactam (ZOSYN) IVPB 3.375 g  Status:  Discontinued     3.375 g 12.5 mL/hr over 240 Minutes Intravenous Every 8 hours 07/11/17 1328 07/11/17 1746   07/11/17 2000  ceFEPIme (MAXIPIME) 2 g in sodium chloride 0.9 % 100 mL IVPB     2 g 200 mL/hr over 30 Minutes Intravenous Every 24 hours 07/11/17 1746     07/11/17 1400  vancomycin (VANCOCIN) 2,000 mg in sodium chloride 0.9 % 500 mL IVPB     2,000 mg 250 mL/hr over 120 Minutes Intravenous  Once 07/11/17 1326 07/11/17 1720   07/11/17 1315  piperacillin-tazobactam (ZOSYN) IVPB 3.375 g     3.375 g 100 mL/hr over 30 Minutes Intravenous  Once 07/11/17 1307 07/11/17 1406   07/11/17 1315  vancomycin (VANCOCIN) IVPB 1000 mg/200 mL premix  Status:  Discontinued     1,000 mg 200 mL/hr over 60 Minutes Intravenous  Once 07/11/17 1307 07/11/17 1325        Objective:   Vitals:   07/13/17 1141 07/13/17 1200 07/13/17 1300 07/13/17 1400  BP:  (!) 122/93 116/85 128/66  Pulse: 73 81    Resp: (!) 27 (!) 21 17 18   Temp: 97.9 F (36.6 C)     TempSrc: Oral  SpO2: 97% (!) 83%    Weight:      Height:        Wt Readings from Last 3 Encounters:  07/13/17 89.3 kg (196 lb 13.9 oz)  07/10/17 86.2 kg (190 lb)  06/21/17 88.9 kg (196 lb)     Intake/Output Summary (Last 24 hours) at 07/13/2017 1507 Last data filed  at 07/13/2017 1300 Gross per 24 hour  Intake 777.1 ml  Output 650 ml  Net 127.1 ml     Physical Exam Gen: elderly male in NAD, fatigued HEENT: moist mocosa, supple neck  chest: few scattered rhonchi CVS: NS1 &S2, no murmurs GI: soft , NT, ND, BS+, foley + Musculoskeletal: warm, no edema CNS: AAOX2    Data Review:    CBC Recent Labs  Lab 07/11/17 1235 07/11/17 1256 07/11/17 1746 07/12/17 0423 07/13/17 0403  WBC 4.1  --  3.2* 3.8* 3.7*  HGB 9.9* 9.5* 9.3* 9.7* 9.4*  HCT 30.2* 28.0* 28.0* 30.4* 28.8*  PLT 134*  --  134* 188 179  MCV 88.0  --  88.6 90.2 87.0  MCH 28.9  --  29.4 28.8 28.4  MCHC 32.8  --  33.2 31.9 32.6  RDW 16.4*  --  16.5* 16.7* 16.8*  LYMPHSABS 0.6*  --  0.5*  --   --   MONOABS 0.5  --  0.3  --   --   EOSABS 0.3  --  0.2  --   --   BASOSABS 0.0  --  0.0  --   --     Chemistries  Recent Labs  Lab 07/11/17 1235 07/11/17 1256 07/11/17 1746 07/12/17 0423 07/13/17 0403  NA 134* 136  --  136 135  K 3.8 3.8  --  4.9 4.6  CL 101 98*  --  102 101  CO2 22  --   --  16* 18*  GLUCOSE 81 76  --  117* 117*  BUN 15 13  --  18 28*  CREATININE 1.58* 1.60*  --  1.75* 1.66*  CALCIUM 7.8*  --   --  7.5* 7.9*  MG  --   --  1.4*  --  2.0  AST 87*  --   --  100* 80*  ALT 28  --   --  32 34  ALKPHOS 78  --   --  77 77  BILITOT 1.6*  --   --  1.4* 1.1   ------------------------------------------------------------------------------------------------------------------ No results for input(s): CHOL, HDL, LDLCALC, TRIG, CHOLHDL, LDLDIRECT in the last 72 hours.  Lab Results  Component Value Date   HGBA1C 4.7 (L) 06/18/2017   ------------------------------------------------------------------------------------------------------------------ No results for input(s): TSH, T4TOTAL, T3FREE, THYROIDAB in the last 72 hours.  Invalid input(s): FREET3 ------------------------------------------------------------------------------------------------------------------ No  results for input(s): VITAMINB12, FOLATE, FERRITIN, TIBC, IRON, RETICCTPCT in the last 72 hours.  Coagulation profile Recent Labs  Lab 07/11/17 1235 07/11/17 1746  INR 1.38 1.54    No results for input(s): DDIMER in the last 72 hours.  Cardiac Enzymes Recent Labs  Lab 07/11/17 1235  TROPONINI 0.03*   ------------------------------------------------------------------------------------------------------------------    Component Value Date/Time   BNP 471.0 (H) 07/11/2017 1240    Inpatient Medications  Scheduled Meds: . B-complex with vitamin C  1 tablet Oral Daily  . Chlorhexidine Gluconate Cloth  6 each Topical Daily  . enoxaparin (LOVENOX) injection  40 mg Subcutaneous Q24H  . ferrous sulfate  325 mg Oral Daily  . hydrocortisone sod succinate (SOLU-CORTEF) inj  100 mg Intravenous  Q8H  . insulin aspart  0-9 Units Subcutaneous Q4H  . ipratropium  0.5 mg Nebulization Q6H WA  . levalbuterol  0.63 mg Nebulization Q6H WA  . levothyroxine  25 mcg Intravenous QAC breakfast  . levothyroxine  50 mcg Oral QAC breakfast  . mouth rinse  15 mL Mouth Rinse BID  . midodrine  2.5 mg Oral TID WC  . pantoprazole (PROTONIX) IV  40 mg Intravenous Q24H  . sodium chloride flush  10-40 mL Intracatheter Q12H  . sodium chloride flush  3 mL Intravenous Q12H   Continuous Infusions: . ceFEPime (MAXIPIME) IV Stopped (07/12/17 2150)  . norepinephrine (LEVOPHED) Adult infusion Stopped (07/12/17 2115)   PRN Meds:.acetaminophen **OR** acetaminophen, albuterol, ondansetron **OR** ondansetron (ZOFRAN) IV, sodium chloride flush  Micro Results Recent Results (from the past 240 hour(s))  Urine culture     Status: None   Collection Time: 07/11/17 12:30 PM  Result Value Ref Range Status   Specimen Description   Final    URINE, RANDOM Performed at [facility], [address], [place], [place] [address]    Special Requests   Final    URINE, RANDOM Performed at [facility], [address]  [place], [place] [address]    Culture   Final    NO GROWTH Performed at [facility], [place] [address], [place], [place] [address]    Report Status 07/12/2017 FINAL  Final  Blood Culture (routine x 2)     Status: None (Preliminary result)   Collection Time: 07/11/17 12:41 PM  Result Value Ref Range Status   Specimen Description BLOOD RIGHT ARM  Final   Special Requests   Final    BOTTLES DRAWN AEROBIC AND ANAEROBIC Blood Culture adequate volume   Culture   Final    NO GROWTH 2 DAYS Performed at [facility], [address] [place], [place] [address]    Report Status PENDING  Incomplete  Blood Culture (routine x 2)     Status: None (Preliminary result)   Collection Time: 07/11/17 12:44 PM  Result Value Ref Range Status   Specimen Description BLOOD RIGHT HAND  Final   Special Requests   Final    BOTTLES DRAWN AEROBIC AND ANAEROBIC Blood Culture adequate volume   Culture   Final    NO GROWTH 2 DAYS Performed at [facility], [address] [place], [place] [address]    Report Status PENDING  Incomplete  MRSA PCR Screening     Status: None   Collection Time: 07/11/17  5:00 PM  Result Value Ref Range Status   MRSA by PCR NEGATIVE NEGATIVE Final    Comment:        The GeneXpert MRSA Assay (FDA approved for NASAL specimens only), is one component of a comprehensive MRSA colonization surveillance program. It is not intended to diagnose MRSA infection nor to guide or monitor treatment for MRSA infections. Performed at [facility], [address], [place],  [address]     Radiology Reports Dg Chest 2 View  Result Date: 06/18/2017 CLINICAL DATA:  Pt brought in from PCP by EMS due to hypotension. Pt was to be seen due dizziness and falls for weeks. Pt seen here and placed on iron pills. BP in PCP office initially 68/40 . EMS reports 90/62. Denies pain. Pt reports dizzine.*comment was truncated* EXAM: CHEST - 2 VIEW COMPARISON:  06/08/2017 FINDINGS: Normal cardiac silhouette.  Lungs are mildly hyperinflated. No effusion, infiltrate or pneumothorax. Calcified pulmonary nodules unchanged from prior.  IMPRESSION: No acute cardiopulmonary process. Electronically Signed   By: Suzy Bouchard M.D.   On: 06/18/2017 12:48   Ct Head Wo Contrast  Result Date: 07/11/2017 CLINICAL DATA:  Multiple falls today. EXAM: CT HEAD WITHOUT CONTRAST TECHNIQUE: Contiguous axial images were obtained from the base of the skull through the vertex without intravenous contrast. COMPARISON:  06/18/2017 FINDINGS: Brain: Stable age related cerebral atrophy, ventriculomegaly and periventricular white matter disease. No extra-axial fluid collections are identified. No CT findings for acute hemispheric infarction or intracranial hemorrhage. No mass lesions. The brainstem and cerebellum are normal. Vascular: No hyperdense vessel or unexpected calcification. Skull: No skull fracture or bone lesion. Sinuses/Orbits: Ethmoid and maxillary sinus disease. The frontal sinuses are clear and the mastoid air cells and middle ear cavities are clear. Other: No scalp lesion or hematoma. IMPRESSION: 1. Age related cerebral atrophy, ventriculomegaly and periventricular white matter disease. 2. No acute intracranial findings or skull fracture. 3. Maxillary and ethmoid sinus disease. Electronically Signed   By: Marijo Sanes M.D.   On: 07/11/2017 13:38   Ct Head Wo Contrast  Result Date: 06/18/2017 CLINICAL DATA:  Head trauma.  Hypotension. Initial encounter. EXAM: CT HEAD WITHOUT CONTRAST TECHNIQUE: Contiguous axial images were obtained from the base of the skull through the vertex without intravenous contrast. COMPARISON:  None. FINDINGS: Brain: No evidence of acute infarction, hemorrhage, hydrocephalus, extra-axial collection or mass lesion/mass effect. Nonspecific atrophy with ventriculomegaly. Chronic small vessel ischemia in the periventricular white matter. Vascular: Atherosclerotic calcification.  No hyperdense vessel.  Skull: Negative Sinuses/Orbits: No acute finding.  Left cataract resection. IMPRESSION: No evidence of intracranial injury. Electronically Signed   By: Monte Fantasia M.D.   On: 06/18/2017 12:09   Dg Chest Port 1 View  Result Date: 07/11/2017 CLINICAL DATA:  Weakness, hypotension, coronary artery disease. EXAM: PORTABLE CHEST 1 VIEW COMPARISON:  Chest x-rays dated 06/18/2017 and 06/01/2017. FINDINGS: New opacity at the medial aspects of the right lung base, pneumonia versus asymmetric edema. There is mild bilateral interstitial prominence suggesting interstitial edema. Stable cardiomegaly. IMPRESSION: 1. Cardiomegaly with interstitial thickening, presumed interstitial edema, suggesting mild CHF/volume overload. 2. More confluent opacity at the medial right lung base could represent asymmetric pulmonary edema or pneumonia, favor edema if afebrile. Electronically Signed   By: Franki Cabot M.D.   On: 07/11/2017 13:24   Dg Chest Port 1v Same Day  Result Date: 07/11/2017 CLINICAL DATA:  Central line placement. EXAM: PORTABLE CHEST 1 VIEW COMPARISON:  07/11/2017 at 12:58 p.m. FINDINGS: Interval placement of right IJ central venous catheter with tip over the SVC. Lungs are adequately inflated with mild prominence of the perihilar markings suggesting mild vascular congestion. No pneumothorax. Improved hazy opacification over the right base. No evidence of effusion. Mild stable cardiomegaly. Remainder of the exam is unchanged. IMPRESSION: Findings suggesting mild vascular congestion with mild stable cardiomegaly. Right IJ central venous catheter with tip over the SVC. No pneumothorax. Electronically Signed   By: Marin Olp M.D.   On: 07/11/2017 15:58    Time Spent in minutes  25   Kaysey Berndt M.D on 07/13/2017 at 3:07 PM  Between 7am to 7pm - Pager - [phone]  After 7pm go to [email or web address] - password [facility]  [facility] -  Office  [phone]

## 2017-07-14 ENCOUNTER — Inpatient Hospital Stay (HOSPITAL_COMMUNITY)
Admit: 2017-07-14 | Discharge: 2017-07-14 | Disposition: A | Discharge status: Home / Self Care | Payer: Medicare HMO | Attending: Internal Medicine | Admitting: Internal Medicine

## 2017-07-14 DIAGNOSIS — R55 Syncope and collapse: Secondary | ICD-10-CM

## 2017-07-14 LAB — GLUCOSE, CAPILLARY
GLUCOSE-CAPILLARY: 84 mg/dL (ref 65–99)
Glucose-Capillary: 100 mg/dL — ABNORMAL HIGH (ref 65–99)
Glucose-Capillary: 104 mg/dL — ABNORMAL HIGH (ref 65–99)
Glucose-Capillary: 108 mg/dL — ABNORMAL HIGH (ref 65–99)
Glucose-Capillary: 110 mg/dL — ABNORMAL HIGH (ref 65–99)
Glucose-Capillary: 134 mg/dL — ABNORMAL HIGH (ref 65–99)

## 2017-07-14 LAB — CREATININE, SERUM
Creatinine, Ser: 1.56 mg/dL — ABNORMAL HIGH (ref 0.61–1.24)
GFR, EST AFRICAN AMERICAN: 46 mL/min — AB (ref >60)
GFR, EST NON AFRICAN AMERICAN: 40 mL/min — AB (ref >60)

## 2017-07-14 MED ORDER — DM-GUAIFENESIN ER 30-600 MG PO TB12
1.0000 | ORAL_TABLET | Freq: Two times a day (BID) | ORAL | Status: DC
  Administered 2017-07-14 – 2017-07-16 (×4): 1 via ORAL
  Filled 2017-07-14 (×5): qty 1

## 2017-07-14 MED ORDER — GUAIFENESIN-DM 100-10 MG/5ML PO SYRP
5.0000 mL | ORAL_SOLUTION | ORAL | Status: DC | PRN
  Administered 2017-07-14 (×2): 5 mL via ORAL
  Filled 2017-07-14 (×2): qty 5

## 2017-07-14 MED ORDER — AMOXICILLIN-POT CLAVULANATE 875-125 MG PO TABS
1.0000 | ORAL_TABLET | Freq: Two times a day (BID) | ORAL | Status: DC
  Administered 2017-07-14 – 2017-07-16 (×5): 1 via ORAL
  Filled 2017-07-14 (×5): qty 1

## 2017-07-14 MED ORDER — LEVALBUTEROL HCL 0.63 MG/3ML IN NEBU
0.6300 mg | INHALATION_SOLUTION | Freq: Three times a day (TID) | RESPIRATORY_TRACT | Status: DC
  Administered 2017-07-14 – 2017-07-15 (×3): 0.63 mg via RESPIRATORY_TRACT
  Filled 2017-07-14 (×3): qty 3

## 2017-07-14 MED ORDER — IPRATROPIUM BROMIDE 0.02 % IN SOLN
0.5000 mg | Freq: Three times a day (TID) | RESPIRATORY_TRACT | Status: DC
  Administered 2017-07-14 – 2017-07-15 (×3): 0.5 mg via RESPIRATORY_TRACT
  Filled 2017-07-14 (×3): qty 2.5

## 2017-07-14 NOTE — Procedures (Signed)
ELECTROENCEPHALOGRAM REPORT  Date of Study: 07/14/2017  Patient's Name: Brett [name] MRN: [id number] Date of Birth: [birth year]  Referring Provider: Dr. Flonnie Overman Dhungel  Clinical History: This is an 84 year old man with an episode of loss of consciousness  Medications: acetaminophen (TYLENOL) tablet 650 mg  albuterol (PROVENTIL) (2.5 MG/3ML) 0.083% nebulizer solution 3 mL  B-complex with vitamin C tablet 1 tablet  ceFEPIme (MAXIPIME) 2 g in sodium chloride 0.9 % 100 mL IVPB  Chlorhexidine Gluconate Cloth 2 % PADS 6 each  enoxaparin (LOVENOX) injection 40 mg  ferrous sulfate tablet 325 mg  insulin aspart (novoLOG) injection 0-9 Units  ipratropium (ATROVENT) nebulizer solution 0.5 mg  levalbuterol (XOPENEX) nebulizer solution 0.63 mg  levothyroxine (SYNTHROID, LEVOTHROID) tablet 50 mcg  midodrine (PROAMATINE) tablet 2.5 mg  zolpidem (AMBIEN) tablet 5 mg   Technical Summary: A multichannel digital EEG recording measured by the international 10-20 system with electrodes applied with paste and impedances below 5000 ohms performed in our laboratory with EKG monitoring in a predominantly drowsy and asleep patient.  Hyperventilation and photic stimulation were not performed.  The digital EEG was referentially recorded, reformatted, and digitally filtered in a variety of bipolar and referential montages for optimal display.    Description: The patient is predominantly drowsy and asleep during the recording.  During brief period of wakefulness, there is no clear posterior dominant rhythm seen. The record is symmetric.  During drowsiness and sleep, there is an increase in theta slowing of the background.  Vertex waves and symmetric sleep spindles were seen.  Hyperventilation and photic stimulation were not performed.  There were no epileptiform discharges or electrographic seizures seen.    EKG lead showed irregular rhythm.  Impression: This predominantly drowsy and asleep EEG is normal.     Clinical Correlation: A normal EEG does not exclude a clinical diagnosis of epilepsy. Clinical correlation is advised.   Ellouise Newer, M.D.

## 2017-07-14 NOTE — Progress Notes (Signed)
PROGRESS NOTE                                                                                                                                                                                                             Patient Demographics:    Brett [name], is a 84 y.o. male, DOB - May 30, 1932, JQB:[id number]  Admit date - 07/11/2017   Admitting Physician Louellen Molder, MD  Outpatient Primary MD for the patient is [name], MD  LOS - 3  Outpatient Specialists: [facility] cardiology  Chief Complaint  Patient presents with  . Weakness       Brief Narrative   Please refer to admission H&P for details, in brief, 84 year old male with A. fib on chronic anticoagulation, CKD stage III, hypertension, hypothyroidism, diabetes mellitus with recent hospitalization about 2 weeks back with hypotension and weakness with blood pressure dropping to 60s per EMS and improved with fluids.  He also had significant anemia requiring almost 6 unit PRBC along with FFP.  EGD done showed nonbleeding erosive gastropathy and diverticulosis on colonoscopy.  Coumadin was discontinued upon discharge.  Patient sent to SNF but signed himself out few days prior to this admission.  He was living with his girlfriend and per his son had few episodes of fall at home and also did not refill his prescription. On the morning of admission social worker visited his place for evaluation and found him on the floor for unknown duration and patient also not communicating well.  In the ED patient was hypotensive with blood pressure of 71/53 mmHg, hypothermic with temperature of 92.7 F.  Hemoglobin at baseline along with normal chemistry and negative Hemoccult.  He had CK of 1600 and BNP of 470.  Chest x-ray concerning for pneumonia .  Patient received 3 L normal saline bolus in the ED without much improvement. Patient admitted to ICU for septic shock on empiric antibiotics, IV  hydrocortisone and Levophed drip. Hypothermia protocol initiated in the ED.   Subjective:   Much oriented this morning.  Says he slept better.  Denies any pain.   Assessment  & Plan :    Principal Problem:   Septic shock ([place]) Probably due to healthcare associated vs aspiration pneumonia.   Sepsis now resolved. Off levophed drip, and IV hydrocortisone.  Critical care consult appreciated.   Continue cefepime. Blood  cx negative. Flu PCR negative. strep and legionella ag pending. Stable for transfer to medical floor.  Active Problems:   HCAP (healthcare-associated pneumonia) Possible aspiration cannot be ruled out. Has mild aspiration risk per SLP. treatment as outlined above. Prn nebs.  Acute toxic metabolic encephalopathy Secondary to septic shock.  Much improved and close to baseline. Check EEG to rule out seizures.  CT head  without acute findings.      Type 2 diabetes mellitus with diabetic nephropathy (HCC)  on sliding scale coverage.    Chronic kidney disease (CKD), stage III (moderate) (HCC) Stable at baseline    Hypothyroidism TSH stable.  Continue Synthroid..    Transaminitis Elevated AST.  Possibly from sepsis.  Monitor for now.     Chronic diastolic heart failure (HCC) No signs of fluid overload.   holding all blood pressure medications.  Rhabdomyolysis. Possibly from being on the floor for several hours.  CPK improving. EEG to rule out seizures given recurrent fall at home recently.   Paroxysmal A. fib ([place]) Recently taken off Coumadin for blood loss anemia.  Amiodarone discontinued by cardiology during outpatient visit on 3/28.  Off beta-blocker.  COPD Wheezing improved with nebs..  Iron deficiency anemia Resume supplement.  Prolonged QTC Avoid QT prolonging agents.  Replenished low magnesium and potassium         Code Status : DNR  Family Communication  : None at bedside today  Disposition Plan  : Transfer to medical floor   Barriers  For Discharge : Active symptoms.  PT evaluation.  Possible discharge in 24-48 hours  Consults  : Critical care  Procedures  : EEG, head CT  DVT Prophylaxis  :  Lovenox -  Lab Results  Component Value Date   PLT 179 07/13/2017    Antibiotics  :    Anti-infectives (From admission, onward)   Start     Dose/Rate Route Frequency Ordered Stop   07/12/17 1400  vancomycin (VANCOCIN) 1,250 mg in sodium chloride 0.9 % 250 mL IVPB  Status:  Discontinued     1,250 mg 166.7 mL/hr over 90 Minutes Intravenous Every 24 hours 07/11/17 1328 07/13/17 1235   07/11/17 2200  piperacillin-tazobactam (ZOSYN) IVPB 3.375 g  Status:  Discontinued     3.375 g 12.5 mL/hr over 240 Minutes Intravenous Every 8 hours 07/11/17 1328 07/11/17 1746   07/11/17 2000  ceFEPIme (MAXIPIME) 2 g in sodium chloride 0.9 % 100 mL IVPB     2 g 200 mL/hr over 30 Minutes Intravenous Every 24 hours 07/11/17 1746     07/11/17 1400  vancomycin (VANCOCIN) 2,000 mg in sodium chloride 0.9 % 500 mL IVPB     2,000 mg 250 mL/hr over 120 Minutes Intravenous  Once 07/11/17 1326 07/11/17 1720   07/11/17 1315  piperacillin-tazobactam (ZOSYN) IVPB 3.375 g     3.375 g 100 mL/hr over 30 Minutes Intravenous  Once 07/11/17 1307 07/11/17 1406   07/11/17 1315  vancomycin (VANCOCIN) IVPB 1000 mg/200 mL premix  Status:  Discontinued     1,000 mg 200 mL/hr over 60 Minutes Intravenous  Once 07/11/17 1307 07/11/17 1325        Objective:   Vitals:   07/14/17 0400 07/14/17 0500 07/14/17 0600 07/14/17 0735  BP: (!) 104/47 (!) 95/57 105/70   Pulse: (!) 36 66 (!) 57   Resp: (!) 22 15 (!) 26   Temp: 97.7 F (36.5 C)     TempSrc: Oral     SpO2: 94% 96%  96% 97%  Weight:  88 kg (194 lb 0.1 oz)    Height:        Wt Readings from Last 3 Encounters:  07/14/17 88 kg (194 lb 0.1 oz)  07/10/17 86.2 kg (190 lb)  06/21/17 88.9 kg (196 lb)     Intake/Output Summary (Last 24 hours) at 07/14/2017 2993 Last data filed at 07/13/2017 2041 Gross per 24  hour  Intake 700 ml  Output -  Net 700 ml     Physical Exam General: Elderly male in no acute distress, HEENT: Moist mucosa, supple neck Chest: Few coarse breath sounds, CVS: Normal S1 and S2, no murmurs GI: Soft, nondistended, nontender Musculoskeletal: Warm, no edema CNS: Alert and oriented x2-3     Data Review:    CBC Recent Labs  Lab 07/11/17 1235 07/11/17 1256 07/11/17 1746 07/12/17 0423 07/13/17 0403  WBC 4.1  --  3.2* 3.8* 3.7*  HGB 9.9* 9.5* 9.3* 9.7* 9.4*  HCT 30.2* 28.0* 28.0* 30.4* 28.8*  PLT 134*  --  134* 188 179  MCV 88.0  --  88.6 90.2 87.0  MCH 28.9  --  29.4 28.8 28.4  MCHC 32.8  --  33.2 31.9 32.6  RDW 16.4*  --  16.5* 16.7* 16.8*  LYMPHSABS 0.6*  --  0.5*  --   --   MONOABS 0.5  --  0.3  --   --   EOSABS 0.3  --  0.2  --   --   BASOSABS 0.0  --  0.0  --   --     Chemistries  Recent Labs  Lab 07/11/17 1235 07/11/17 1256 07/11/17 1746 07/12/17 0423 07/13/17 0403 07/14/17 0457  NA 134* 136  --  136 135  --   K 3.8 3.8  --  4.9 4.6  --   CL 101 98*  --  102 101  --   CO2 22  --   --  16* 18*  --   GLUCOSE 81 76  --  117* 117*  --   BUN 15 13  --  18 28*  --   CREATININE 1.58* 1.60*  --  1.75* 1.66* 1.56*  CALCIUM 7.8*  --   --  7.5* 7.9*  --   MG  --   --  1.4*  --  2.0  --   AST 87*  --   --  100* 80*  --   ALT 28  --   --  32 34  --   ALKPHOS 78  --   --  77 77  --   BILITOT 1.6*  --   --  1.4* 1.1  --    ------------------------------------------------------------------------------------------------------------------ No results for input(s): CHOL, HDL, LDLCALC, TRIG, CHOLHDL, LDLDIRECT in the last 72 hours.  Lab Results  Component Value Date   HGBA1C 4.7 (L) 06/18/2017   ------------------------------------------------------------------------------------------------------------------ No results for input(s): TSH, T4TOTAL, T3FREE, THYROIDAB in the last 72 hours.  Invalid input(s):  FREET3 ------------------------------------------------------------------------------------------------------------------ No results for input(s): VITAMINB12, FOLATE, FERRITIN, TIBC, IRON, RETICCTPCT in the last 72 hours.  Coagulation profile Recent Labs  Lab 07/11/17 1235 07/11/17 1746  INR 1.38 1.54    No results for input(s): DDIMER in the last 72 hours.  Cardiac Enzymes Recent Labs  Lab 07/11/17 1235  TROPONINI 0.03*   ------------------------------------------------------------------------------------------------------------------    Component Value Date/Time   BNP 471.0 (H) 07/11/2017 1240    Inpatient Medications  Scheduled Meds: . B-complex with vitamin C  1 tablet  Oral Daily  . Chlorhexidine Gluconate Cloth  6 each Topical Daily  . enoxaparin (LOVENOX) injection  40 mg Subcutaneous Q24H  . ferrous sulfate  325 mg Oral Daily  . insulin aspart  0-9 Units Subcutaneous Q4H  . ipratropium  0.5 mg Nebulization TID  . levalbuterol  0.63 mg Nebulization TID  . levothyroxine  25 mcg Intravenous QAC breakfast  . levothyroxine  50 mcg Oral QAC breakfast  . mouth rinse  15 mL Mouth Rinse BID  . midodrine  2.5 mg Oral TID WC  . pantoprazole (PROTONIX) IV  40 mg Intravenous Q24H  . sodium chloride flush  10-40 mL Intracatheter Q12H  . sodium chloride flush  3 mL Intravenous Q12H   Continuous Infusions: . ceFEPime (MAXIPIME) IV Stopped (07/13/17 2041)   PRN Meds:.acetaminophen **OR** acetaminophen, albuterol, ondansetron **OR** ondansetron (ZOFRAN) IV, sodium chloride flush, zolpidem  Micro Results Recent Results (from the past 240 hour(s))  Urine culture     Status: None   Collection Time: 07/11/17 12:30 PM  Result Value Ref Range Status   Specimen Description   Final    URINE, RANDOM Performed at [facility], [address] [place], [place] [address]    Special Requests   Final    URINE, RANDOM Performed at [facility], [address] [place], [place]  [address]    Culture   Final    NO GROWTH Performed at [facility], [place] [address], [place], [place] [address]    Report Status 07/12/2017 FINAL  Final  Blood Culture (routine x 2)     Status: None (Preliminary result)   Collection Time: 07/11/17 12:41 PM  Result Value Ref Range Status   Specimen Description BLOOD RIGHT ARM  Final   Special Requests   Final    BOTTLES DRAWN AEROBIC AND ANAEROBIC Blood Culture adequate volume   Culture   Final    NO GROWTH 3 DAYS Performed at [facility], [address], [place], [place] [address]    Report Status PENDING  Incomplete  Blood Culture (routine x 2)     Status: None (Preliminary result)   Collection Time: 07/11/17 12:44 PM  Result Value Ref Range Status   Specimen Description BLOOD RIGHT HAND  Final   Special Requests   Final    BOTTLES DRAWN AEROBIC AND ANAEROBIC Blood Culture adequate volume   Culture   Final    NO GROWTH 3 DAYS Performed at [facility], [address] [place], [place] [address]    Report Status PENDING  Incomplete  MRSA PCR Screening     Status: None   Collection Time: 07/11/17  5:00 PM  Result Value Ref Range Status   MRSA by PCR NEGATIVE NEGATIVE Final    Comment:        The GeneXpert MRSA Assay (FDA approved for NASAL specimens only), is one component of a comprehensive MRSA colonization surveillance program. It is not intended to diagnose MRSA infection nor to guide or monitor treatment for MRSA infections. Performed at [facility], [address], [place], [place] [address]     Radiology Reports Dg Chest 2 View  Result Date: 06/18/2017 CLINICAL DATA:  Pt brought in from PCP by EMS due to hypotension. Pt was to be seen due dizziness and falls for weeks. Pt seen here and placed on iron pills. BP in PCP office initially 68/40 . EMS reports 90/62. Denies pain. Pt reports dizzine.*comment was truncated* EXAM: CHEST - 2 VIEW COMPARISON:  06/08/2017  FINDINGS: Normal cardiac silhouette. Lungs are  mildly hyperinflated. No effusion, infiltrate or pneumothorax. Calcified pulmonary nodules unchanged from prior. IMPRESSION: No acute cardiopulmonary process. Electronically Signed   By: Suzy Bouchard M.D.   On: 06/18/2017 12:48   Ct Head Wo Contrast  Result Date: 07/11/2017 CLINICAL DATA:  Multiple falls today. EXAM: CT HEAD WITHOUT CONTRAST TECHNIQUE: Contiguous axial images were obtained from the base of the skull through the vertex without intravenous contrast. COMPARISON:  06/18/2017 FINDINGS: Brain: Stable age related cerebral atrophy, ventriculomegaly and periventricular white matter disease. No extra-axial fluid collections are identified. No CT findings for acute hemispheric infarction or intracranial hemorrhage. No mass lesions. The brainstem and cerebellum are normal. Vascular: No hyperdense vessel or unexpected calcification. Skull: No skull fracture or bone lesion. Sinuses/Orbits: Ethmoid and maxillary sinus disease. The frontal sinuses are clear and the mastoid air cells and middle ear cavities are clear. Other: No scalp lesion or hematoma. IMPRESSION: 1. Age related cerebral atrophy, ventriculomegaly and periventricular white matter disease. 2. No acute intracranial findings or skull fracture. 3. Maxillary and ethmoid sinus disease. Electronically Signed   By: Marijo Sanes M.D.   On: 07/11/2017 13:38   Ct Head Wo Contrast  Result Date: 06/18/2017 CLINICAL DATA:  Head trauma.  Hypotension. Initial encounter. EXAM: CT HEAD WITHOUT CONTRAST TECHNIQUE: Contiguous axial images were obtained from the base of the skull through the vertex without intravenous contrast. COMPARISON:  None. FINDINGS: Brain: No evidence of acute infarction, hemorrhage, hydrocephalus, extra-axial collection or mass lesion/mass effect. Nonspecific atrophy with ventriculomegaly. Chronic small vessel ischemia in the periventricular white matter. Vascular: Atherosclerotic calcification.  No hyperdense vessel. Skull: Negative  Sinuses/Orbits: No acute finding.  Left cataract resection. IMPRESSION: No evidence of intracranial injury. Electronically Signed   By: Monte Fantasia M.D.   On: 06/18/2017 12:09   Dg Chest Port 1 View  Result Date: 07/11/2017 CLINICAL DATA:  Weakness, hypotension, coronary artery disease. EXAM: PORTABLE CHEST 1 VIEW COMPARISON:  Chest x-rays dated 06/18/2017 and 06/01/2017. FINDINGS: New opacity at the medial aspects of the right lung base, pneumonia versus asymmetric edema. There is mild bilateral interstitial prominence suggesting interstitial edema. Stable cardiomegaly. IMPRESSION: 1. Cardiomegaly with interstitial thickening, presumed interstitial edema, suggesting mild CHF/volume overload. 2. More confluent opacity at the medial right lung base could represent asymmetric pulmonary edema or pneumonia, favor edema if afebrile. Electronically Signed   By: Franki Cabot M.D.   On: 07/11/2017 13:24   Dg Chest Port 1v Same Day  Result Date: 07/11/2017 CLINICAL DATA:  Central line placement. EXAM: PORTABLE CHEST 1 VIEW COMPARISON:  07/11/2017 at 12:58 p.m. FINDINGS: Interval placement of right IJ central venous catheter with tip over the SVC. Lungs are adequately inflated with mild prominence of the perihilar markings suggesting mild vascular congestion. No pneumothorax. Improved hazy opacification over the right base. No evidence of effusion. Mild stable cardiomegaly. Remainder of the exam is unchanged. IMPRESSION: Findings suggesting mild vascular congestion with mild stable cardiomegaly. Right IJ central venous catheter with tip over the SVC. No pneumothorax. Electronically Signed   By: Marin Olp M.D.   On: 07/11/2017 15:58    Time Spent in minutes  25   Loranzo Desha M.D on 07/14/2017 at 8:12 AM  Between 7am to 7pm - Pager - [phone]  After 7pm go to [email or web address] - password [facility]  [facility] -  Office  [phone]

## 2017-07-14 NOTE — Progress Notes (Signed)
  Nutrition Brief Note  Patient identified on the Malnutrition Screening Tool (MST) Report. He was here last month with weakness, falls.  He was found significantly anemic and was tranfused with 6 units PRBC.    Patient presented on 3/29- c/o weakness. He has been cleared to transfer out of ICU.  He ambulates with a walker and uses a wheelchair at times.   Wt Readings from Last 15 Encounters:  07/14/17 195 lb 15.8 oz (88.9 kg)  07/10/17 190 lb (86.2 kg)  06/21/17 196 lb (88.9 kg)  06/08/17 195 lb (88.5 kg)  06/01/17 195 lb (88.5 kg)  05/26/17 201 lb (91.2 kg)  04/22/17 207 lb (93.9 kg)  04/14/17 205 lb (93 kg)  01/13/17 203 lb (92.1 kg)  12/25/16 201 lb (91.2 kg)  12/22/16 199 lb 14.4 oz (90.7 kg)  12/12/16 210 lb (95.3 kg)  11/27/16 210 lb (95.3 kg)  11/18/16 206 lb 9.6 oz (93.7 kg)  11/16/16 211 lb 3.2 oz (95.8 kg)   Mild acute weight changes noted or reported by patient girlfriend. UBW-200-210 lb range.  Body mass index is 26.58 kg/m. Patient meets criteria for overweight based on current BMI.   Current diet order is Dysphagia 2, patient is consuming approximately 25% of meals at this time. His girlfriend says his diet was downgraded due to not having his dentures. They have brought them to him now and he would prefer to have regular textures resumed.   He follows a regular diet at home. Usually cooks himself eggs and bacon + biscuit for breakfast, has a light lunch (varies) and either cooks at night or his son will bring in food for him. Usual appetite reported as good.   Labs: Cr 1.56 improved from 1.77on admission. Albumin 2.9 , AST-80 . HGB- 9.4 CK-610.    Medications reviewed: Ferrous sulfate, Insulin (sensitive scale), Vitamin B-complex with C daily.   No nutrition interventions warranted at this time. If nutrition issues arise, please consult RD.    Colman Cater MS,RD,CSG,LDN Office: [phone] Pager: [phone]

## 2017-07-14 NOTE — Progress Notes (Signed)
EEG completed; results pending.    

## 2017-07-15 DIAGNOSIS — J9601 Acute respiratory failure with hypoxia: Secondary | ICD-10-CM

## 2017-07-15 DIAGNOSIS — J189 Pneumonia, unspecified organism: Secondary | ICD-10-CM

## 2017-07-15 LAB — GLUCOSE, CAPILLARY
GLUCOSE-CAPILLARY: 87 mg/dL (ref 65–99)
GLUCOSE-CAPILLARY: 106 mg/dL — AB (ref 65–99)
GLUCOSE-CAPILLARY: 118 mg/dL — AB (ref 65–99)
Glucose-Capillary: 101 mg/dL — ABNORMAL HIGH (ref 65–99)
Glucose-Capillary: 133 mg/dL — ABNORMAL HIGH (ref 65–99)
Glucose-Capillary: 89 mg/dL (ref 65–99)

## 2017-07-15 MED ORDER — IPRATROPIUM-ALBUTEROL 0.5-2.5 (3) MG/3ML IN SOLN
3.0000 mL | Freq: Four times a day (QID) | RESPIRATORY_TRACT | Status: DC
  Administered 2017-07-15 – 2017-07-16 (×3): 3 mL via RESPIRATORY_TRACT
  Filled 2017-07-15 (×3): qty 3

## 2017-07-15 MED ORDER — FUROSEMIDE 10 MG/ML IJ SOLN
40.0000 mg | Freq: Once | INTRAMUSCULAR | Status: AC
  Administered 2017-07-15: 40 mg via INTRAVENOUS
  Filled 2017-07-15: qty 4

## 2017-07-15 NOTE — Evaluation (Signed)
Physical Therapy Evaluation Patient Details Name: Brett [name] MRN: [id number] DOB: [birth year] Today's Date: 07/15/2017   History of Present Illness  Brett [name]  is a 84 y.o. male, with history of A. fib on chronic anticoagulation with Coumadin, chronic kidney disease stage III (baseline creatinine of 1.8), hypertension, hypothyroidism, type 2 diabetes mellitus who was hospitalized from 3/6-3/11 with hypotension and weakness with systolic blood pressure reported by EMS of 60s improved with fluids on site.  He was seen in the ED twice before for weakness and blood pressure medication adjusted during that time.    Clinical Impression  Patient presents with numerous scarring on knees and legs due to falls per patient.  Patient has most difficulty with sit to stands requiring VC's for proper hand placement, very unsteady with buckling of knees when standing and drops into chair when he sits, able to ambulate without losing balance, limited secondary to fatigue.  Patient will benefit from continued physical therapy in hospital and recommended venue below to increase strength, balance, endurance for safe ADLs and gait.    Follow Up Recommendations SNF;Supervision/Assistance - 24 hour    Equipment Recommendations  None recommended by PT    Recommendations for Other Services       Precautions / Restrictions Precautions Precautions: Fall Restrictions Weight Bearing Restrictions: No      Mobility  Bed Mobility Overal bed mobility: Needs Assistance Bed Mobility: Supine to Sit     Supine to sit: Min guard     General bed mobility comments: has to use siderail  Transfers Overall transfer level: Needs assistance Equipment used: Rolling walker (2 wheeled) Transfers: Sit to/from [facility] Sit to Stand: Mod assist Stand pivot transfers: Min assist;Mod assist       General transfer comment: requires VC's for proper hand placement during sit to stands, very unsteady  with buckling of knees  Ambulation/Gait Ambulation/Gait assistance: Min assist Ambulation Distance (Feet): 35 Feet Assistive device: Rolling walker (2 wheeled) Gait Pattern/deviations: Decreased step length - right;Decreased step length - left;Decreased stride length Gait velocity: decreased Gait velocity interpretation: Below normal speed for age/gender General Gait Details: demonstrates slow labored cadence with difficulty making turns, no loss of balance, limited secondary to c/o fatigue  Stairs            Wheelchair Mobility    Modified Rankin (Stroke Patients Only)       Balance Overall balance assessment: Needs assistance Sitting-balance support: No upper extremity supported;Feet supported Sitting balance-Leahy Scale: Fair     Standing balance support: Bilateral upper extremity supported;During functional activity Standing balance-Leahy Scale: Fair Standing balance comment: using RW                             Pertinent Vitals/Pain Pain Assessment: No/denies pain    Home Living Family/patient expects to be discharged to:: Private residence Living Arrangements: Spouse/significant other Available Help at Discharge: Family;Available PRN/intermittently Type of Home: House Home Access: Stairs to enter;Ramped entrance   Entrance Stairs-Number of Steps: 3 Home Layout: One level Home Equipment: Walker - 4 wheels;Wheelchair - manual;Shower seat;Bedside commode      Prior Function Level of Independence: Independent with assistive device(s)               Hand Dominance        Extremity/Trunk Assessment   Upper Extremity Assessment Upper Extremity Assessment: Generalized weakness    Lower Extremity Assessment Lower Extremity Assessment: Generalized weakness  Cervical / Trunk Assessment Cervical / Trunk Assessment: Kyphotic  Communication   Communication: No difficulties  Cognition Arousal/Alertness: Awake/alert Behavior During  Therapy: WFL for tasks assessed/performed Overall Cognitive Status: Within Functional Limits for tasks assessed                                        General Comments      Exercises     Assessment/Plan    PT Assessment Patient needs continued PT services  PT Problem List Decreased strength;Decreased activity tolerance;Decreased balance;Decreased mobility       PT Treatment Interventions Gait training;Functional mobility training;Therapeutic activities;Therapeutic exercise;Patient/family education    PT Goals (Current goals can be found in the Care Plan section)  Acute Rehab PT Goals Patient Stated Goal: return home with family to assist PT Goal Formulation: With patient Time For Goal Achievement: 07/29/17 Potential to Achieve Goals: Good    Frequency Min 3X/week   Barriers to discharge        Co-evaluation               AM-PAC PT "6 Clicks" Daily Activity  Outcome Measure Difficulty turning over in bed (including adjusting bedclothes, sheets and blankets)?: None Difficulty moving from lying on back to sitting on the side of the bed? : None Difficulty sitting down on and standing up from a chair with arms (e.g., wheelchair, bedside commode, etc,.)?: A Lot Help needed moving to and from a bed to chair (including a wheelchair)?: A Lot Help needed walking in hospital room?: A Lot Help needed climbing 3-5 steps with a railing? : A Lot 6 Click Score: 16    End of Session Equipment Utilized During Treatment: Gait belt Activity Tolerance: Patient limited by fatigue Patient left: in chair;with call bell/phone within reach Nurse Communication: Mobility status PT Visit Diagnosis: Unsteadiness on feet (R26.81);Other abnormalities of gait and mobility (R26.89);Muscle weakness (generalized) (M62.81)    Time: 1010-1036 PT Time Calculation (min) (ACUTE ONLY): 26 min   Charges:   PT Evaluation $PT Eval Moderate Complexity: 1 Mod PT  Treatments $Therapeutic Activity: 23-37 mins   PT G Codes:        12:10 PM, 2017/07/20 Lonell Grandchild, MPT Physical Therapist with [facility] [phone] office [phone] mobile phone

## 2017-07-15 NOTE — Plan of Care (Signed)
  Problem: Acute Rehab PT Goals(only PT should resolve) Goal: Pt Will Go Supine/Side To Sit Outcome: Progressing Flowsheets (Taken 07/15/2017 1211) Pt will go Supine/Side to Sit: with supervision Goal: Patient Will Transfer Sit To/From Stand Outcome: Progressing Flowsheets (Taken 07/15/2017 1211) Patient will transfer sit to/from stand: with min guard assist Goal: Pt Will Transfer Bed To Chair/Chair To Bed Outcome: Progressing Flowsheets (Taken 07/15/2017 1211) Pt will Transfer Bed to Chair/Chair to Bed: min guard assist Goal: Pt Will Ambulate Outcome: Progressing Flowsheets (Taken 07/15/2017 1211) Pt will Ambulate: 75 feet;with min guard assist;with rolling walker  12:12 PM, 07/15/17 Lonell Grandchild, MPT Physical Therapist with [facility] [phone] office [phone] mobile phone

## 2017-07-15 NOTE — NC FL2 (Signed)
[place] LEVEL OF CARE SCREENING TOOL     IDENTIFICATION  Patient Name: Brett [name] Birthdate: Jan 31, 1933 Sex: male Admission Date (Current Location): 07/11/2017  [facility] and [place] Number:  [employer] and Address:  [place] [address] [place]      Provider Number: [id number]  Attending Physician Name and Address:  Louellen Molder, MD  Relative Name and Phone Number:       Current Level of Care: Hospital Recommended Level of Care: [place] Prior Approval Number:    Date Approved/Denied:   PASRR Number: [id number]  Discharge Plan: SNF    Current Diagnoses: Patient Active Problem List   Diagnosis Date Noted  . Septic shock ([place]) 07/11/2017  . HCAP (healthcare-associated pneumonia) 07/11/2017  . Rhabdomyolysis 07/11/2017  . Orthostasis 06/18/2017  . Normocytic anemia 06/18/2017  . Chronic diastolic CHF (congestive heart failure) ([place]) 06/18/2017  . CHF exacerbation ([place]) 12/20/2016  . Acute congestive heart failure ([place])   . Acute on chronic diastolic CHF (congestive heart failure) ([place]) 10/24/2016  . Jaundice   . Periumbilical pain   . Elevated lipase   . Coagulopathy ([place]) 10/16/2016  . Elevated liver enzymes   . Hyperbilirubinemia   . Hyponatremia   . Abdominal pain 10/15/2016  . A-fib ([place]) 02/23/2014  . Diabetes ([place]) 02/23/2014  . BP (high blood pressure) 02/23/2014  . HLD (hyperlipidemia) 02/23/2014  . Excess weight 02/23/2014  . Chronic diastolic heart failure ([place]) 12/24/2013  . Acute bronchitis 12/06/2013  . DIC (disseminated intravascular coagulation) ([place]) 12/04/2013  . Thrombocytopenia, unspecified ([place]) 12/03/2013  . Pulmonary hypertension ([place]) 12/03/2013  . Wheezing 12/02/2013  . Obesity, morbid ([place]) 12/02/2013  . Transaminitis 12/02/2013  . Type 2 diabetes mellitus with diabetic nephropathy ([place]) 12/01/2013  . Hypertension 12/01/2013  . Atrial fibrillation  with RVR ([place]) 12/01/2013  . Gall stones, common bile duct 12/01/2013  . Pancreatitis due to biliary obstruction 12/01/2013  . Chronic kidney disease (CKD), stage III (moderate) ([place]) 12/01/2013  . Hypothyroidism 12/01/2013  . Cholelithiasis 12/01/2013  . History of other specified conditions presenting hazards to health 02/09/2008  . Lumbar back sprain 02/09/2008  . Herpes zoster keratoconjunctivitis 09/15/2007  . Acute systolic heart failure ([place]) 08/20/2007  . PNA (pneumonia) 08/20/2007  . Fast heart beat 08/20/2007  . Feeling bilious 08/16/2007    Orientation RESPIRATION BLADDER Height & Weight     Time, Self, Place  Normal Continent Weight: 195 lb 15.8 oz (88.9 kg) Height:  6' (182.9 cm)  BEHAVIORAL SYMPTOMS/MOOD NEUROLOGICAL BOWEL NUTRITION STATUS      Continent Diet(discharge summary)  AMBULATORY STATUS COMMUNICATION OF NEEDS Skin   Limited Assist Verbally Normal                       Personal Care Assistance Level of Assistance  Bathing, Feeding, Dressing Bathing Assistance: Limited assistance Feeding assistance: Limited assistance Dressing Assistance: Limited assistance     Functional Limitations Info  Sight, Hearing, Speech Sight Info: Adequate Hearing Info: Adequate Speech Info: Adequate    SPECIAL CARE FACTORS FREQUENCY  Speech therapy     PT Frequency: 5x/week        Speech Therapy Frequency: 3x/week      Contractures Contractures Info: Not present    Additional Factors Info  Code Status, Allergies Code Status Info: Full Code Allergies Info: NKA           Current Medications (07/15/2017):  This is  the current hospital active medication list Current Facility-Administered Medications  Medication Dose Route Frequency Provider Last Rate Last Dose  . acetaminophen (TYLENOL) tablet 650 mg  650 mg Oral Q6H PRN Dhungel, Nishant, MD       Or  . acetaminophen (TYLENOL) suppository 650 mg  650 mg Rectal Q6H PRN Dhungel, Nishant, MD      .  albuterol (PROVENTIL) (2.5 MG/3ML) 0.083% nebulizer solution 3 mL  3 mL Inhalation Q2H PRN [name], MD      . amoxicillin-clavulanate (AUGMENTIN) 875-125 MG per tablet 1 tablet  1 tablet Oral Q12H Dhungel, Nishant, MD   1 tablet at 07/15/17 0848  . B-complex with vitamin C tablet 1 tablet  1 tablet Oral Daily Dhungel, Nishant, MD   1 tablet at 07/15/17 0849  . Chlorhexidine Gluconate Cloth 2 % PADS 6 each  6 each Topical Daily Dhungel, Nishant, MD   6 each at 07/14/17 1104  . dextromethorphan-guaiFENesin (MUCINEX DM) 30-600 MG per 12 hr tablet 1 tablet  1 tablet Oral BID Dhungel, Nishant, MD   1 tablet at 07/15/17 0850  . enoxaparin (LOVENOX) injection 40 mg  40 mg Subcutaneous Q24H Dhungel, Nishant, MD   40 mg at 07/14/17 1818  . ferrous sulfate tablet 325 mg  325 mg Oral Daily Dhungel, Nishant, MD   325 mg at 07/15/17 0850  . guaiFENesin-dextromethorphan (ROBITUSSIN DM) 100-10 MG/5ML syrup 5 mL  5 mL Oral Q4H PRN Dhungel, Nishant, MD   5 mL at 07/14/17 2233  . insulin aspart (novoLOG) injection 0-9 Units  0-9 Units Subcutaneous Q4H Dhungel, Nishant, MD   1 Units at 07/15/17 0040  . ipratropium-albuterol (DUONEB) 0.5-2.5 (3) MG/3ML nebulizer solution 3 mL  3 mL Nebulization Q6H Dhungel, Nishant, MD      . levothyroxine (SYNTHROID, LEVOTHROID) tablet 50 mcg  50 mcg Oral QAC breakfast Dhungel, Nishant, MD   50 mcg at 07/15/17 0848  . MEDLINE mouth rinse  15 mL Mouth Rinse BID Dhungel, Nishant, MD   15 mL at 07/15/17 0854  . midodrine (PROAMATINE) tablet 2.5 mg  2.5 mg Oral TID WC Dhungel, Nishant, MD   2.5 mg at 07/15/17 1211  . ondansetron (ZOFRAN) tablet 4 mg  4 mg Oral Q6H PRN Dhungel, Nishant, MD       Or  . ondansetron (ZOFRAN) injection 4 mg  4 mg Intravenous Q6H PRN Dhungel, Nishant, MD      . sodium chloride flush (NS) 0.9 % injection 10-40 mL  10-40 mL Intracatheter Q12H Dhungel, Nishant, MD   30 mL at 07/15/17 0855  . sodium chloride flush (NS) 0.9 % injection 10-40 mL  10-40 mL  Intracatheter PRN Dhungel, Nishant, MD      . sodium chloride flush (NS) 0.9 % injection 3 mL  3 mL Intravenous Q12H Dhungel, Nishant, MD   3 mL at 07/14/17 2222  . zolpidem (AMBIEN) tablet 5 mg  5 mg Oral QHS PRN Dhungel, Nishant, MD   5 mg at 07/14/17 2244     Discharge Medications: Please see discharge summary for a list of discharge medications.  Relevant Imaging Results:  Relevant Lab Results:   Additional Information SSN [id number] Clydene Pugh, LCSW

## 2017-07-15 NOTE — Progress Notes (Addendum)
PROGRESS NOTE                                                                                                                                                                                                             Patient Demographics:    Brett [name], is a 84 y.o. male, DOB - 1933-03-13, BTD:[id number]  Admit date - 07/11/2017   Admitting Physician Louellen Molder, MD  Outpatient Primary MD for the patient is [name], MD  LOS - 4  Outpatient Specialists: [facility] cardiology  Chief Complaint  Patient presents with  . Weakness       Brief Narrative   Please refer to admission H&P for details, in brief, 84 year old male with A. fib on chronic anticoagulation, CKD stage III, hypertension, hypothyroidism, diabetes mellitus with recent hospitalization about 2 weeks back with hypotension and weakness with blood pressure dropping to 60s per EMS and improved with fluids.  He also had significant anemia requiring almost 6 unit PRBC along with FFP.  EGD done showed nonbleeding erosive gastropathy and diverticulosis on colonoscopy.  Coumadin was discontinued upon discharge.  Patient sent to SNF but signed himself out few days prior to this admission.  He was living with his girlfriend and per his son had few episodes of fall at home and also did not refill his prescription. On the morning of admission social worker visited his place for evaluation and found him on the floor for unknown duration and patient also not communicating well.  In the ED patient was hypotensive with blood pressure of 71/53 mmHg, hypothermic with temperature of 92.7 F.  Hemoglobin at baseline along with normal chemistry and negative Hemoccult.  He had CK of 1600 and BNP of 470.  Chest x-ray concerning for pneumonia .  Patient received 3 L normal saline bolus in the ED without much improvement. Patient admitted to ICU for septic shock on empiric antibiotics, IV  hydrocortisone and Levophed drip. Hypothermia protocol initiated in the ED.   Subjective:   Seems oriented to baseline.  No overnight events.   Assessment  & Plan :    Principal Problem:   Septic shock ([place]) Probably due to healthcare associated vs aspiration pneumonia.  Sepsis resolved.  Off levo fed drip and IV hydrocortisone. Narrow antibiotics to oral Augmentin.  C blood cultures and flu PCR negative. Critical care  consult appreciated.   Active Problems:   HCAP (healthcare-associated pneumonia) Possible aspiration cannot be ruled out. Has mild aspiration risk per SLP. treatment as outlined above. Prn nebs.  Narrow antibiotics to Augmentin.   Acute respiratory failure with hypoxia Possibly due to acute on chronic diastolic CHF.  Also has wheezing.    increase duo nebs and order a dose of IV Lasix.   Acute toxic metabolic encephalopathy Secondary to septic shock.  Now at baseline. EEG negative for seizures.  Head CT unremarkable.  Orthostatic Hypotension Chronic.  Was found to be hypotensive during previous hospitalization as well.  Continue midodrine. Add ted hose.    Type 2 diabetes mellitus with diabetic nephropathy (HCC)  on sliding scale coverage.    Chronic kidney disease (CKD), stage III (moderate) (HCC) Stable at baseline.    Hypothyroidism TSH stable.  Continue Synthroid..    Transaminitis Elevated AST.  Possibly from sepsis.  Monitor for now.   Rhabdomyolysis. Possibly from being on the floor for several hours.  CPK improving. EEG to rule out seizures given recurrent fall at home recently.   Paroxysmal A. fib ([place]) Recently taken off Coumadin for blood loss anemia.  Amiodarone discontinued by cardiology during outpatient visit on 3/28.  Off beta-blocker.  COPD Wheezing improved with nebs..  Iron deficiency anemia Resume supplement.  Prolonged QTC Avoid QT prolonging agents.  Replenished low magnesium and potassium         Code Status :  DNR  Family Communication  : None at bedside today.  Son involved in care  Disposition Plan  : Home versus SNF tomorrow if continues to improve  Barriers For Discharge : PT evaluation.  Improving symptoms.  Possibly home versus SNF tomorrow.  Consults  : Critical care  Procedures  : EEG, head CT  DVT Prophylaxis  :  Lovenox -  Lab Results  Component Value Date   PLT 179 07/13/2017    Antibiotics  :    Anti-infectives (From admission, onward)   Start     Dose/Rate Route Frequency Ordered Stop   07/14/17 1130  amoxicillin-clavulanate (AUGMENTIN) 875-125 MG per tablet 1 tablet     1 tablet Oral Every 12 hours 07/14/17 1103     07/12/17 1400  vancomycin (VANCOCIN) 1,250 mg in sodium chloride 0.9 % 250 mL IVPB  Status:  Discontinued     1,250 mg 166.7 mL/hr over 90 Minutes Intravenous Every 24 hours 07/11/17 1328 07/13/17 1235   07/11/17 2200  piperacillin-tazobactam (ZOSYN) IVPB 3.375 g  Status:  Discontinued     3.375 g 12.5 mL/hr over 240 Minutes Intravenous Every 8 hours 07/11/17 1328 07/11/17 1746   07/11/17 2000  ceFEPIme (MAXIPIME) 2 g in sodium chloride 0.9 % 100 mL IVPB  Status:  Discontinued     2 g 200 mL/hr over 30 Minutes Intravenous Every 24 hours 07/11/17 1746 07/14/17 1103   07/11/17 1400  vancomycin (VANCOCIN) 2,000 mg in sodium chloride 0.9 % 500 mL IVPB     2,000 mg 250 mL/hr over 120 Minutes Intravenous  Once 07/11/17 1326 07/11/17 1720   07/11/17 1315  piperacillin-tazobactam (ZOSYN) IVPB 3.375 g     3.375 g 100 mL/hr over 30 Minutes Intravenous  Once 07/11/17 1307 07/11/17 1406   07/11/17 1315  vancomycin (VANCOCIN) IVPB 1000 mg/200 mL premix  Status:  Discontinued     1,000 mg 200 mL/hr over 60 Minutes Intravenous  Once 07/11/17 1307 07/11/17 1325        Objective:  Vitals:   07/14/17 1941 07/14/17 2149 07/15/17 0633 07/15/17 0738  BP:  107/61 (!) 108/51   Pulse: 65 66 68   Resp: 18 18 18    Temp:  97.7 F (36.5 C) (!) 97.5 F (36.4 C)     TempSrc:  Axillary Oral   SpO2: 95% 96% 95% 94%  Weight:      Height:        Wt Readings from Last 3 Encounters:  07/14/17 88.9 kg (195 lb 15.8 oz)  07/10/17 86.2 kg (190 lb)  06/21/17 88.9 kg (196 lb)     Intake/Output Summary (Last 24 hours) at 07/15/2017 1056 Last data filed at 07/15/2017 0900 Gross per 24 hour  Intake 90 ml  Output 225 ml  Net -135 ml     Physical Exam General: Elderly male not in distress HEENT: Moist mucosa, supple neck Chest: Fine bibasilar crackles along with scattered wheezing CVS: Normal S1 and S2, no murmurs GI: Soft, nondistended, nontender Musculoskeletal: Warm, trace edema CNS: Alert and oriented      Data Review:    CBC Recent Labs  Lab 07/11/17 1235 07/11/17 1256 07/11/17 1746 07/12/17 0423 07/13/17 0403  WBC 4.1  --  3.2* 3.8* 3.7*  HGB 9.9* 9.5* 9.3* 9.7* 9.4*  HCT 30.2* 28.0* 28.0* 30.4* 28.8*  PLT 134*  --  134* 188 179  MCV 88.0  --  88.6 90.2 87.0  MCH 28.9  --  29.4 28.8 28.4  MCHC 32.8  --  33.2 31.9 32.6  RDW 16.4*  --  16.5* 16.7* 16.8*  LYMPHSABS 0.6*  --  0.5*  --   --   MONOABS 0.5  --  0.3  --   --   EOSABS 0.3  --  0.2  --   --   BASOSABS 0.0  --  0.0  --   --     Chemistries  Recent Labs  Lab 07/11/17 1235 07/11/17 1256 07/11/17 1746 07/12/17 0423 07/13/17 0403 07/14/17 0457  NA 134* 136  --  136 135  --   K 3.8 3.8  --  4.9 4.6  --   CL 101 98*  --  102 101  --   CO2 22  --   --  16* 18*  --   GLUCOSE 81 76  --  117* 117*  --   BUN 15 13  --  18 28*  --   CREATININE 1.58* 1.60*  --  1.75* 1.66* 1.56*  CALCIUM 7.8*  --   --  7.5* 7.9*  --   MG  --   --  1.4*  --  2.0  --   AST 87*  --   --  100* 80*  --   ALT 28  --   --  32 34  --   ALKPHOS 78  --   --  77 77  --   BILITOT 1.6*  --   --  1.4* 1.1  --    ------------------------------------------------------------------------------------------------------------------ No results for input(s): CHOL, HDL, LDLCALC, TRIG, CHOLHDL, LDLDIRECT in  the last 72 hours.  Lab Results  Component Value Date   HGBA1C 4.7 (L) 06/18/2017   ------------------------------------------------------------------------------------------------------------------ No results for input(s): TSH, T4TOTAL, T3FREE, THYROIDAB in the last 72 hours.  Invalid input(s): FREET3 ------------------------------------------------------------------------------------------------------------------ No results for input(s): VITAMINB12, FOLATE, FERRITIN, TIBC, IRON, RETICCTPCT in the last 72 hours.  Coagulation profile Recent Labs  Lab 07/11/17 1235 07/11/17 1746  INR 1.38 1.54    No results  for input(s): DDIMER in the last 72 hours.  Cardiac Enzymes Recent Labs  Lab 07/11/17 1235  TROPONINI 0.03*   ------------------------------------------------------------------------------------------------------------------    Component Value Date/Time   BNP 471.0 (H) 07/11/2017 1240    Inpatient Medications  Scheduled Meds: . amoxicillin-clavulanate  1 tablet Oral Q12H  . B-complex with vitamin C  1 tablet Oral Daily  . Chlorhexidine Gluconate Cloth  6 each Topical Daily  . dextromethorphan-guaiFENesin  1 tablet Oral BID  . enoxaparin (LOVENOX) injection  40 mg Subcutaneous Q24H  . ferrous sulfate  325 mg Oral Daily  . insulin aspart  0-9 Units Subcutaneous Q4H  . ipratropium  0.5 mg Nebulization TID  . levalbuterol  0.63 mg Nebulization TID  . levothyroxine  50 mcg Oral QAC breakfast  . mouth rinse  15 mL Mouth Rinse BID  . midodrine  2.5 mg Oral TID WC  . sodium chloride flush  10-40 mL Intracatheter Q12H  . sodium chloride flush  3 mL Intravenous Q12H   Continuous Infusions:  PRN Meds:.acetaminophen **OR** acetaminophen, albuterol, guaiFENesin-dextromethorphan, ondansetron **OR** ondansetron (ZOFRAN) IV, sodium chloride flush, zolpidem  Micro Results Recent Results (from the past 240 hour(s))  Urine culture     Status: None   Collection Time:  07/11/17 12:30 PM  Result Value Ref Range Status   Specimen Description   Final    URINE, RANDOM Performed at [facility], [address] [place], [place] [address]    Special Requests   Final    URINE, RANDOM Performed at [facility], [address] [place], [place] [address]    Culture   Final    NO GROWTH Performed at [facility], [place] [address], [place], [place] [address]    Report Status 07/12/2017 FINAL  Final  Blood Culture (routine x 2)     Status: None (Preliminary result)   Collection Time: 07/11/17 12:41 PM  Result Value Ref Range Status   Specimen Description BLOOD RIGHT ARM  Final   Special Requests   Final    BOTTLES DRAWN AEROBIC AND ANAEROBIC Blood Culture adequate volume   Culture   Final    NO GROWTH 4 DAYS Performed at [facility], [address], [place], [place] [address]    Report Status PENDING  Incomplete  Blood Culture (routine x 2)     Status: None (Preliminary result)   Collection Time: 07/11/17 12:44 PM  Result Value Ref Range Status   Specimen Description BLOOD RIGHT HAND  Final   Special Requests   Final    BOTTLES DRAWN AEROBIC AND ANAEROBIC Blood Culture adequate volume   Culture   Final    NO GROWTH 4 DAYS Performed at [facility], [address] [place], [place] [address]    Report Status PENDING  Incomplete  MRSA PCR Screening     Status: None   Collection Time: 07/11/17  5:00 PM  Result Value Ref Range Status   MRSA by PCR NEGATIVE NEGATIVE Final    Comment:        The GeneXpert MRSA Assay (FDA approved for NASAL specimens only), is one component of a comprehensive MRSA colonization surveillance program. It is not intended to diagnose MRSA infection nor to guide or monitor treatment for MRSA infections. Performed at [facility], [address] [place], [place] [address]     Radiology Reports Dg Chest 2 View  Result Date: 06/18/2017 CLINICAL DATA:  Pt brought in from PCP by EMS due to hypotension. Pt was  to be seen  due dizziness and falls for weeks. Pt seen here and placed on iron pills. BP in PCP office initially 68/40 . EMS reports 90/62. Denies pain. Pt reports dizzine.*comment was truncated* EXAM: CHEST - 2 VIEW COMPARISON:  06/08/2017 FINDINGS: Normal cardiac silhouette. Lungs are mildly hyperinflated. No effusion, infiltrate or pneumothorax. Calcified pulmonary nodules unchanged from prior. IMPRESSION: No acute cardiopulmonary process. Electronically Signed   By: Suzy Bouchard M.D.   On: 06/18/2017 12:48   Ct Head Wo Contrast  Result Date: 07/11/2017 CLINICAL DATA:  Multiple falls today. EXAM: CT HEAD WITHOUT CONTRAST TECHNIQUE: Contiguous axial images were obtained from the base of the skull through the vertex without intravenous contrast. COMPARISON:  06/18/2017 FINDINGS: Brain: Stable age related cerebral atrophy, ventriculomegaly and periventricular white matter disease. No extra-axial fluid collections are identified. No CT findings for acute hemispheric infarction or intracranial hemorrhage. No mass lesions. The brainstem and cerebellum are normal. Vascular: No hyperdense vessel or unexpected calcification. Skull: No skull fracture or bone lesion. Sinuses/Orbits: Ethmoid and maxillary sinus disease. The frontal sinuses are clear and the mastoid air cells and middle ear cavities are clear. Other: No scalp lesion or hematoma. IMPRESSION: 1. Age related cerebral atrophy, ventriculomegaly and periventricular white matter disease. 2. No acute intracranial findings or skull fracture. 3. Maxillary and ethmoid sinus disease. Electronically Signed   By: Marijo Sanes M.D.   On: 07/11/2017 13:38   Ct Head Wo Contrast  Result Date: 06/18/2017 CLINICAL DATA:  Head trauma.  Hypotension. Initial encounter. EXAM: CT HEAD WITHOUT CONTRAST TECHNIQUE: Contiguous axial images were obtained from the base of the skull through the vertex without intravenous contrast. COMPARISON:  None. FINDINGS: Brain: No evidence of acute  infarction, hemorrhage, hydrocephalus, extra-axial collection or mass lesion/mass effect. Nonspecific atrophy with ventriculomegaly. Chronic small vessel ischemia in the periventricular white matter. Vascular: Atherosclerotic calcification.  No hyperdense vessel. Skull: Negative Sinuses/Orbits: No acute finding.  Left cataract resection. IMPRESSION: No evidence of intracranial injury. Electronically Signed   By: Monte Fantasia M.D.   On: 06/18/2017 12:09   Dg Chest Port 1 View  Result Date: 07/11/2017 CLINICAL DATA:  Weakness, hypotension, coronary artery disease. EXAM: PORTABLE CHEST 1 VIEW COMPARISON:  Chest x-rays dated 06/18/2017 and 06/01/2017. FINDINGS: New opacity at the medial aspects of the right lung base, pneumonia versus asymmetric edema. There is mild bilateral interstitial prominence suggesting interstitial edema. Stable cardiomegaly. IMPRESSION: 1. Cardiomegaly with interstitial thickening, presumed interstitial edema, suggesting mild CHF/volume overload. 2. More confluent opacity at the medial right lung base could represent asymmetric pulmonary edema or pneumonia, favor edema if afebrile. Electronically Signed   By: Franki Cabot M.D.   On: 07/11/2017 13:24   Dg Chest Port 1v Same Day  Result Date: 07/11/2017 CLINICAL DATA:  Central line placement. EXAM: PORTABLE CHEST 1 VIEW COMPARISON:  07/11/2017 at 12:58 p.m. FINDINGS: Interval placement of right IJ central venous catheter with tip over the SVC. Lungs are adequately inflated with mild prominence of the perihilar markings suggesting mild vascular congestion. No pneumothorax. Improved hazy opacification over the right base. No evidence of effusion. Mild stable cardiomegaly. Remainder of the exam is unchanged. IMPRESSION: Findings suggesting mild vascular congestion with mild stable cardiomegaly. Right IJ central venous catheter with tip over the SVC. No pneumothorax. Electronically Signed   By: Marin Olp M.D.   On: 07/11/2017 15:58     Time Spent in minutes  25   Nohlan Burdin M.D on 07/15/2017 at 10:56 AM  Between 7am  to 7pm - Pager - [phone]  After 7pm go to [email or web address] - password [facility]  [facility] -  Office  [phone]

## 2017-07-15 NOTE — Clinical Social Work Note (Signed)
Clinical Social Work Assessment  Patient Details  Name: Brett [name] MRN: [id number] Date of Birth: September 26, 1932  Date of referral:  07/15/17               Reason for consult:  Facility Placement, Discharge Planning                Permission sought to share information with:  Chartered certified accountant granted to share information::  Yes, Verbal Permission Granted  Name::        Agency::  [facility]  Relationship::     Contact Information:     Housing/Transportation Living arrangements for the past 2 months:  Single Family Home Source of Information:  Adult Children Patient Interpreter Needed:  None Criminal Activity/Legal Involvement Pertinent to Current Situation/Hospitalization:  No - Comment as needed Significant Relationships:  Adult Children, Significant Other Lives with:  Significant Other Do you feel safe going back to the place where you live?  No Need for family participation in patient care:  Yes (Comment)  Care giving concerns: PT recommending STR at SNF.   Social Worker assessment / plan: Pt is an 84 year old male referred to CSW for SNF rehab placement. Met with pt today. He is agreeable to SNF and states he was just at one but can't remember the name of it. Called pt's daughter in law to discuss. DIL states that pt was at [facility] after a stay at [facility] and pt's sig other signed him out a week ago Monday. DIL feels that pt was not ready to be discharged and he fell the first night home and spent five hours lying on the floor because no one called family to ask for help. Pt then went to stay with his son and DIL but eventually went back to his own home with Sig other and he fell again and spent the whole night lying on the floor. Pt brought to the ED after this event.   Will start referral to [facility]. Will also refer to APS for follow up.  Employment status:  Retired Nurse, adult PT Recommendations:   [place] / Referral to community resources:     Patient/Family's Response to care: Pt and family accepting of care.  Patient/Family's Understanding of and Emotional Response to Diagnosis, Current Treatment, and Prognosis: Pt and family appear to understand pt's diagnosis and treatment recommendations. There is some psychosocial stress and CSW offered support and information to pt's family.  Emotional Assessment Appearance:  Appears stated age Attitude/Demeanor/Rapport:  Engaged Affect (typically observed):  Pleasant Orientation:  Oriented to Self, Oriented to Place, Oriented to  Time, Oriented to Situation Alcohol / Substance use:  Not Applicable Psych involvement (Current and /or in the community):  No (Comment)  Discharge Needs  Concerns to be addressed:  Discharge Planning Concerns Readmission within the last 30 days:  Yes Current discharge risk:  Physical Impairment Barriers to Discharge:  [place], LCSW 07/15/2017, 1:25 PM

## 2017-07-15 NOTE — NC FL2 (Deleted)
[place] LEVEL OF CARE SCREENING TOOL     IDENTIFICATION  Patient Name: Brett [name] Birthdate: 16-Jul-1932 Sex: male Admission Date (Current Location): 07/11/2017  [facility] and [place] Number:  [employer] and Address:  [place] [address], [address]      Provider Number: [id number]  Attending Physician Name and Address:  Louellen Molder, MD  Relative Name and Phone Number:       Current Level of Care: Hospital Recommended Level of Care: [place] Prior Approval Number:    Date Approved/Denied:   PASRR Number: [id number]  Discharge Plan: SNF    Current Diagnoses: Patient Active Problem List   Diagnosis Date Noted  . Septic shock ([place]) 07/11/2017  . HCAP (healthcare-associated pneumonia) 07/11/2017  . Rhabdomyolysis 07/11/2017  . Orthostasis 06/18/2017  . Normocytic anemia 06/18/2017  . Chronic diastolic CHF (congestive heart failure) ([place]) 06/18/2017  . CHF exacerbation ([place]) 12/20/2016  . Acute congestive heart failure ([place])   . Acute on chronic diastolic CHF (congestive heart failure) ([place]) 10/24/2016  . Jaundice   . Periumbilical pain   . Elevated lipase   . Coagulopathy ([place]) 10/16/2016  . Elevated liver enzymes   . Hyperbilirubinemia   . Hyponatremia   . Abdominal pain 10/15/2016  . A-fib ([place]) 02/23/2014  . Diabetes ([place]) 02/23/2014  . BP (high blood pressure) 02/23/2014  . HLD (hyperlipidemia) 02/23/2014  . Excess weight 02/23/2014  . Chronic diastolic heart failure ([place]) 12/24/2013  . Acute bronchitis 12/06/2013  . DIC (disseminated intravascular coagulation) ([place]) 12/04/2013  . Thrombocytopenia, unspecified ([place]) 12/03/2013  . Pulmonary hypertension ([place]) 12/03/2013  . Wheezing 12/02/2013  . Obesity, morbid ([place]) 12/02/2013  . Transaminitis 12/02/2013  . Type 2 diabetes mellitus with diabetic nephropathy ([place]) 12/01/2013  . Hypertension 12/01/2013  . Atrial fibrillation  with RVR ([place]) 12/01/2013  . Gall stones, common bile duct 12/01/2013  . Pancreatitis due to biliary obstruction 12/01/2013  . Chronic kidney disease (CKD), stage III (moderate) ([place]) 12/01/2013  . Hypothyroidism 12/01/2013  . Cholelithiasis 12/01/2013  . History of other specified conditions presenting hazards to health 02/09/2008  . Lumbar back sprain 02/09/2008  . Herpes zoster keratoconjunctivitis 09/15/2007  . Acute systolic heart failure ([place]) 08/20/2007  . PNA (pneumonia) 08/20/2007  . Fast heart beat 08/20/2007  . Feeling bilious 08/16/2007    Orientation RESPIRATION BLADDER Height & Weight     Time, Self, Place  Normal Continent Weight: 195 lb 15.8 oz (88.9 kg) Height:  6' (182.9 cm)  BEHAVIORAL SYMPTOMS/MOOD NEUROLOGICAL BOWEL NUTRITION STATUS      Continent Diet(discharge summary)  AMBULATORY STATUS COMMUNICATION OF NEEDS Skin   Limited Assist Verbally Normal                       Personal Care Assistance Level of Assistance  Bathing, Feeding, Dressing Bathing Assistance: Limited assistance Feeding assistance: Limited assistance Dressing Assistance: Limited assistance     Functional Limitations Info  Sight, Hearing, Speech Sight Info: Adequate Hearing Info: Adequate Speech Info: Adequate    SPECIAL CARE FACTORS FREQUENCY  PT (By licensed PT)     PT Frequency: 5x/week               Contractures Contractures Info: Not present    Additional Factors Info  Code Status, Allergies Code Status Info: Full Code Allergies Info: NKA           Current Medications (07/15/2017):  This is  the current hospital active medication list Current Facility-Administered Medications  Medication Dose Route Frequency Provider Last Rate Last Dose  . acetaminophen (TYLENOL) tablet 650 mg  650 mg Oral Q6H PRN Dhungel, Nishant, MD       Or  . acetaminophen (TYLENOL) suppository 650 mg  650 mg Rectal Q6H PRN Dhungel, Nishant, MD      . albuterol (PROVENTIL) (2.5  MG/3ML) 0.083% nebulizer solution 3 mL  3 mL Inhalation Q2H PRN [name], MD      . amoxicillin-clavulanate (AUGMENTIN) 875-125 MG per tablet 1 tablet  1 tablet Oral Q12H Dhungel, Nishant, MD   1 tablet at 07/15/17 0848  . B-complex with vitamin C tablet 1 tablet  1 tablet Oral Daily Dhungel, Nishant, MD   1 tablet at 07/15/17 0849  . Chlorhexidine Gluconate Cloth 2 % PADS 6 each  6 each Topical Daily Dhungel, Nishant, MD   6 each at 07/14/17 1104  . dextromethorphan-guaiFENesin (MUCINEX DM) 30-600 MG per 12 hr tablet 1 tablet  1 tablet Oral BID Dhungel, Nishant, MD   1 tablet at 07/15/17 0850  . enoxaparin (LOVENOX) injection 40 mg  40 mg Subcutaneous Q24H Dhungel, Nishant, MD   40 mg at 07/14/17 1818  . ferrous sulfate tablet 325 mg  325 mg Oral Daily Dhungel, Nishant, MD   325 mg at 07/15/17 0850  . guaiFENesin-dextromethorphan (ROBITUSSIN DM) 100-10 MG/5ML syrup 5 mL  5 mL Oral Q4H PRN Dhungel, Nishant, MD   5 mL at 07/14/17 2233  . insulin aspart (novoLOG) injection 0-9 Units  0-9 Units Subcutaneous Q4H Dhungel, Nishant, MD   1 Units at 07/15/17 0040  . ipratropium-albuterol (DUONEB) 0.5-2.5 (3) MG/3ML nebulizer solution 3 mL  3 mL Nebulization Q6H Dhungel, Nishant, MD      . levothyroxine (SYNTHROID, LEVOTHROID) tablet 50 mcg  50 mcg Oral QAC breakfast Dhungel, Nishant, MD   50 mcg at 07/15/17 0848  . MEDLINE mouth rinse  15 mL Mouth Rinse BID Dhungel, Nishant, MD   15 mL at 07/15/17 0854  . midodrine (PROAMATINE) tablet 2.5 mg  2.5 mg Oral TID WC Dhungel, Nishant, MD   2.5 mg at 07/15/17 1211  . ondansetron (ZOFRAN) tablet 4 mg  4 mg Oral Q6H PRN Dhungel, Nishant, MD       Or  . ondansetron (ZOFRAN) injection 4 mg  4 mg Intravenous Q6H PRN Dhungel, Nishant, MD      . sodium chloride flush (NS) 0.9 % injection 10-40 mL  10-40 mL Intracatheter Q12H Dhungel, Nishant, MD   30 mL at 07/15/17 0855  . sodium chloride flush (NS) 0.9 % injection 10-40 mL  10-40 mL Intracatheter PRN Dhungel, Nishant,  MD      . sodium chloride flush (NS) 0.9 % injection 3 mL  3 mL Intravenous Q12H Dhungel, Nishant, MD   3 mL at 07/14/17 2222  . zolpidem (AMBIEN) tablet 5 mg  5 mg Oral QHS PRN Dhungel, Nishant, MD   5 mg at 07/14/17 2244     Discharge Medications: Please see discharge summary for a list of discharge medications.  Relevant Imaging Results:  Relevant Lab Results:   Additional Information SSN [id number] Clydene Pugh, LCSW

## 2017-07-16 ENCOUNTER — Inpatient Hospital Stay (HOSPITAL_COMMUNITY): Payer: Medicare HMO

## 2017-07-16 DIAGNOSIS — X58XXXA Exposure to other specified factors, initial encounter: Secondary | ICD-10-CM | POA: Diagnosis not present

## 2017-07-16 DIAGNOSIS — G4733 Obstructive sleep apnea (adult) (pediatric): Secondary | ICD-10-CM | POA: Diagnosis not present

## 2017-07-16 DIAGNOSIS — T7840XA Allergy, unspecified, initial encounter: Secondary | ICD-10-CM | POA: Diagnosis not present

## 2017-07-16 DIAGNOSIS — I480 Paroxysmal atrial fibrillation: Secondary | ICD-10-CM | POA: Diagnosis not present

## 2017-07-16 DIAGNOSIS — N183 Chronic kidney disease, stage 3 (moderate): Secondary | ICD-10-CM | POA: Diagnosis not present

## 2017-07-16 DIAGNOSIS — I5032 Chronic diastolic (congestive) heart failure: Secondary | ICD-10-CM | POA: Diagnosis not present

## 2017-07-16 DIAGNOSIS — A419 Sepsis, unspecified organism: Secondary | ICD-10-CM | POA: Diagnosis not present

## 2017-07-16 DIAGNOSIS — E1140 Type 2 diabetes mellitus with diabetic neuropathy, unspecified: Secondary | ICD-10-CM | POA: Diagnosis not present

## 2017-07-16 DIAGNOSIS — E118 Type 2 diabetes mellitus with unspecified complications: Secondary | ICD-10-CM | POA: Diagnosis not present

## 2017-07-16 DIAGNOSIS — E1121 Type 2 diabetes mellitus with diabetic nephropathy: Secondary | ICD-10-CM | POA: Diagnosis not present

## 2017-07-16 DIAGNOSIS — R6520 Severe sepsis without septic shock: Secondary | ICD-10-CM | POA: Diagnosis not present

## 2017-07-16 DIAGNOSIS — J449 Chronic obstructive pulmonary disease, unspecified: Secondary | ICD-10-CM | POA: Diagnosis not present

## 2017-07-16 DIAGNOSIS — R6521 Severe sepsis with septic shock: Secondary | ICD-10-CM

## 2017-07-16 DIAGNOSIS — R1312 Dysphagia, oropharyngeal phase: Secondary | ICD-10-CM | POA: Diagnosis not present

## 2017-07-16 DIAGNOSIS — R2681 Unsteadiness on feet: Secondary | ICD-10-CM | POA: Diagnosis not present

## 2017-07-16 DIAGNOSIS — R200 Anesthesia of skin: Secondary | ICD-10-CM | POA: Diagnosis not present

## 2017-07-16 DIAGNOSIS — Z743 Need for continuous supervision: Secondary | ICD-10-CM | POA: Diagnosis not present

## 2017-07-16 DIAGNOSIS — E038 Other specified hypothyroidism: Secondary | ICD-10-CM | POA: Diagnosis not present

## 2017-07-16 DIAGNOSIS — I509 Heart failure, unspecified: Secondary | ICD-10-CM | POA: Diagnosis not present

## 2017-07-16 DIAGNOSIS — R279 Unspecified lack of coordination: Secondary | ICD-10-CM | POA: Diagnosis not present

## 2017-07-16 DIAGNOSIS — R21 Rash and other nonspecific skin eruption: Secondary | ICD-10-CM | POA: Diagnosis not present

## 2017-07-16 DIAGNOSIS — I130 Hypertensive heart and chronic kidney disease with heart failure and stage 1 through stage 4 chronic kidney disease, or unspecified chronic kidney disease: Secondary | ICD-10-CM | POA: Diagnosis not present

## 2017-07-16 DIAGNOSIS — I2510 Atherosclerotic heart disease of native coronary artery without angina pectoris: Secondary | ICD-10-CM | POA: Diagnosis not present

## 2017-07-16 DIAGNOSIS — D649 Anemia, unspecified: Secondary | ICD-10-CM | POA: Diagnosis not present

## 2017-07-16 DIAGNOSIS — R262 Difficulty in walking, not elsewhere classified: Secondary | ICD-10-CM | POA: Diagnosis not present

## 2017-07-16 DIAGNOSIS — M6281 Muscle weakness (generalized): Secondary | ICD-10-CM | POA: Diagnosis not present

## 2017-07-16 DIAGNOSIS — Z7401 Bed confinement status: Secondary | ICD-10-CM | POA: Diagnosis not present

## 2017-07-16 DIAGNOSIS — G3183 Dementia with Lewy bodies: Secondary | ICD-10-CM | POA: Diagnosis not present

## 2017-07-16 DIAGNOSIS — D509 Iron deficiency anemia, unspecified: Secondary | ICD-10-CM | POA: Diagnosis not present

## 2017-07-16 DIAGNOSIS — L309 Dermatitis, unspecified: Secondary | ICD-10-CM | POA: Diagnosis not present

## 2017-07-16 DIAGNOSIS — K5900 Constipation, unspecified: Secondary | ICD-10-CM | POA: Diagnosis not present

## 2017-07-16 DIAGNOSIS — L250 Unspecified contact dermatitis due to cosmetics: Secondary | ICD-10-CM | POA: Diagnosis not present

## 2017-07-16 DIAGNOSIS — E039 Hypothyroidism, unspecified: Secondary | ICD-10-CM | POA: Diagnosis not present

## 2017-07-16 DIAGNOSIS — M150 Primary generalized (osteo)arthritis: Secondary | ICD-10-CM | POA: Diagnosis not present

## 2017-07-16 DIAGNOSIS — N189 Chronic kidney disease, unspecified: Secondary | ICD-10-CM | POA: Diagnosis not present

## 2017-07-16 DIAGNOSIS — I951 Orthostatic hypotension: Secondary | ICD-10-CM | POA: Diagnosis not present

## 2017-07-16 DIAGNOSIS — E1122 Type 2 diabetes mellitus with diabetic chronic kidney disease: Secondary | ICD-10-CM | POA: Diagnosis not present

## 2017-07-16 DIAGNOSIS — I482 Chronic atrial fibrillation: Secondary | ICD-10-CM | POA: Diagnosis not present

## 2017-07-16 DIAGNOSIS — I4891 Unspecified atrial fibrillation: Secondary | ICD-10-CM | POA: Diagnosis not present

## 2017-07-16 DIAGNOSIS — J690 Pneumonitis due to inhalation of food and vomit: Secondary | ICD-10-CM | POA: Diagnosis not present

## 2017-07-16 DIAGNOSIS — J9601 Acute respiratory failure with hypoxia: Secondary | ICD-10-CM | POA: Diagnosis not present

## 2017-07-16 LAB — CULTURE, BLOOD (ROUTINE X 2)
Culture: NO GROWTH
Culture: NO GROWTH
Special Requests: ADEQUATE
Special Requests: ADEQUATE

## 2017-07-16 LAB — GLUCOSE, CAPILLARY
GLUCOSE-CAPILLARY: 88 mg/dL (ref 65–99)
Glucose-Capillary: 84 mg/dL (ref 65–99)
Glucose-Capillary: 89 mg/dL (ref 65–99)
Glucose-Capillary: 112 mg/dL — ABNORMAL HIGH (ref 65–99)

## 2017-07-16 LAB — BASIC METABOLIC PANEL
ANION GAP: 9 (ref 5–15)
BUN: 21 mg/dL — AB (ref 6–20)
CALCIUM: 8 mg/dL — AB (ref 8.9–10.3)
CO2: 26 mmol/L (ref 22–32)
Chloride: 104 mmol/L (ref 101–111)
Creatinine, Ser: 1.49 mg/dL — ABNORMAL HIGH (ref 0.61–1.24)
GFR calc Af Amer: 49 mL/min — ABNORMAL LOW (ref >60)
GFR, EST NON AFRICAN AMERICAN: 42 mL/min — AB (ref >60)
Glucose, Bld: 97 mg/dL (ref 65–99)
Potassium: 3.6 mmol/L (ref 3.5–5.1)
SODIUM: 139 mmol/L (ref 135–145)

## 2017-07-16 MED ORDER — DM-GUAIFENESIN ER 30-600 MG PO TB12: 1.0000 | ORAL_TABLET | Freq: Two times a day (BID) | ORAL | Status: AC

## 2017-07-16 MED ORDER — ZOLPIDEM TARTRATE 5 MG PO TABS: 5.0000 mg | ORAL_TABLET | Freq: Every evening | ORAL | 0 refills | Status: AC | PRN

## 2017-07-16 MED ORDER — IPRATROPIUM-ALBUTEROL 0.5-2.5 (3) MG/3ML IN SOLN
3.0000 mL | Freq: Three times a day (TID) | RESPIRATORY_TRACT | Status: DC
  Administered 2017-07-16: 3 mL via RESPIRATORY_TRACT
  Filled 2017-07-16: qty 3

## 2017-07-16 MED ORDER — AMOXICILLIN-POT CLAVULANATE 875-125 MG PO TABS: 1.0000 | ORAL_TABLET | Freq: Two times a day (BID) | ORAL | Status: AC

## 2017-07-16 NOTE — Progress Notes (Signed)
Modified Barium Swallow Progress Note  Patient Details  Name: Brett [name] MRN: [id number] Date of Birth: [birth year]  Today's Date: 07/16/2017  Modified Barium Swallow completed.  Full report located under Chart Review in the Imaging Section.  Brief recommendations include the following:  Clinical Impression  Pt presents with mild to mild/mod oropharyngeal phase dysphagia with suspected esophageal component. Oral phase is marked by prolonged oral transit with dry solids and piecemeal deglutition likely due to Pt with upper dentures and no lower dentition. Pharyngeal phase is marked by premature spillage to the level of the pyriforms with thin liquids via tsp/cup/straw, mildly reduced hyolaryngeal excursion, prominent cricopharyngeal segment with reduced relaxation resulting in trace, flash penetration during the swallow with large cup sips and straw sips, no aspiration, retention near CP across all consistencies with backflow into pyriforms (usually tail end of bolus). The barium tablet was swallowed whole with cup sip of thin liquid and stopped very briefly near CP, before passing through esophagus. Esophageal sweep revealed what appeared to be a hiatal hernia, although no radiologist present to confirm (previously identified on EGD in early March at [facility]). Pt with reduced awareness/sensation of residuals in pyriforms. Pt also appeared to have bony prominences along C4-7 which may also impact residuals near CP/pyriforms. Recommend D3/mech soft with thin liquids via cup sip, avoid straws, swallow 2-3x for each bite/sip, and clear throat periodically. Pt can likely be advanced to regular textures per treating SLP at SNF. Pt denies dysphagia symptoms/globus sensation, however Pt does present with residuals across all consistencies/textures post swallow from backflow near CP. Could try some strengthening exercises and additional education regarding aspiration precautions as it relates to individuals with  respiratory difficulties at SNF. SLP will sign off during acute stay and recommend f/u at SNF.    Swallow Evaluation Recommendations       SLP Diet Recommendations: Regular solids;Thin liquid;Dysphagia 3 (Mech soft) solids   Liquid Administration via: Cup;No straw   Medication Administration: Whole meds with puree   Supervision: Patient able to self feed;Intermittent supervision to cue for compensatory strategies   Compensations: Small sips/bites;Multiple dry swallows after each bite/sip;Clear throat intermittently   Postural Changes: Remain semi-upright after after feeds/meals (Comment);Seated upright at 90 degrees   Oral Care Recommendations: Oral care BID;Patient independent with oral care   Other Recommendations: Clarify dietary restrictions   Thank you,  Genene Churn, [place]  PORTER,DABNEY 07/16/2017,11:32 AM

## 2017-07-16 NOTE — Care Management Important Message (Signed)
Important Message  Patient Details  Name: Brett [name] MRN: [id number] Date of Birth: 08/28/1932   Medicare Important Message Given:  Yes    Shelda Altes 07/16/2017, 11:31 AM

## 2017-07-16 NOTE — Progress Notes (Addendum)
Pt discharged to [facility] today per MD. [name] IV site D/C'd and WDL. [name] VSS.  Report called to Linna Caprice, RN. Verbalized understanding. Pt currently awaiting EMS transport. Family and patient updated at bedside.

## 2017-07-16 NOTE — Progress Notes (Signed)
  Speech Language Pathology Treatment: Dysphagia  Patient Details Name: Brett [name] MRN: [id number] DOB: 04/23/1932 Today's Date: 07/16/2017 Time: 9622-2979 SLP Time Calculation (min) (ACUTE ONLY): 25 min  Assessment / Plan / Recommendation Clinical Impression  Pt seen at bedside for ongoing diagnostic dysphagia intervention. Pt consuming his breakfast meal with son and daughter in law in room. Pt was placed on D2/chopped on Sunday per SLP BSE, however his order was changed to D1/puree last evening for unknown reason. Pt had mech soft/regular texture breakfast sandwich before him, which his family brought. SLP reviewed rationale for aspiration precautions (pt with coughing with regular textures and mixed consistencies on Sunday) and noted wheezing post swallows of thin via straw with delayed throat clearing and coughing this date. Recommend MBSS prior to d/c to objectively evaluate oropharyngeal swallow given O2 demands, wheezing, and subtle signs of aspiration. Will order and complete later today.   HPI HPI: Please refer to admission H&P for details, in brief, 84 year old male with A. fib on chronic anticoagulation, CKD stage III, hypertension, hypothyroidism, diabetes mellitus with recent hospitalization about 2 weeks back with hypotension and weakness with blood pressure dropping to 60s per EMS and improved with fluids.  He also had significant anemia requiring almost 6 unit PRBC along with FFP.  EGD done showed nonbleeding erosive gastropathy and diverticulosis on colonoscopy.  Coumadin was discontinued upon discharge.  Patient sent to SNF but signed himself out few days prior to this admission.  He was living with his girlfriend and per his son had few episodes of fall at home and also did not refill his prescription. BSE requested. Pt had EGD 06/21/17      SLP Plan  MBS       Recommendations  Diet recommendations: Dysphagia 3 (mechanical soft);Thin liquid Liquids provided via: Cup;No  straw Medication Administration: Whole meds with liquid Supervision: Patient able to self feed;Intermittent supervision to cue for compensatory strategies Compensations: Small sips/bites;Slow rate Postural Changes and/or Swallow Maneuvers: Seated upright 90 degrees;Upright 30-60 min after meal                Oral Care Recommendations: Oral care BID;Staff/trained caregiver to provide oral care Follow up Recommendations: Skilled Nursing facility SLP Visit Diagnosis: Dysphagia, oropharyngeal phase (R13.12) Plan: MBS       Thank you,  Genene Churn, [place]                 [place] 07/16/2017, 9:35 AM

## 2017-07-16 NOTE — Discharge Summary (Signed)
Physician Discharge Summary  Brett [name] DGL:[id number] DOB: March 29, 1933 DOA: 07/11/2017  PCP: Brett Gravel, MD  Admit date: 07/11/2017 Discharge date: 07/16/2017  Time spent: 35 minutes  Recommendations for Outpatient Follow-up:  1. Repeat basic metabolic panel to follow electrolytes and renal function 2. Repeat CBC to follow hemoglobin trend 3. Repeat chest x-ray in 4-6 weeks to assure complete resolution of infiltrates 4. Follow CBGs and A1c with initiation of hypoglycemic regimen if needed. 5. Follow blood pressure and adjust antihypertensive regimen as required.   Discharge Diagnoses:  Principal Problem:   Septic shock ([place]) Active Problems:   Type 2 diabetes mellitus with diabetic nephropathy (HCC)   Chronic kidney disease (CKD), stage III (moderate) (HCC)   Hypothyroidism   Transaminitis   Pulmonary hypertension (HCC)   Chronic diastolic heart failure (HCC)   AF (paroxysmal atrial fibrillation) ([place])   HCAP (healthcare-associated pneumonia)   Rhabdomyolysis   Oropharyngeal dysphagia   Discharge Condition: Stable and improved.  Patient discharged to skilled nursing facility for further rehabilitation and care.  Instructed to follow-up with PCP in 10 days after discharge.  Patient will also follow-up with cardiology service as previously recommended.  Diet recommendation: Dysphagia 3 diet with thin liquids (heart healthy/modified carbohydrates).  Filed Weights   07/13/17 0500 07/14/17 0500 07/14/17 1159  Weight: 89.3 kg (196 lb 13.9 oz) 88 kg (194 lb 0.1 oz) 88.9 kg (195 lb 15.8 oz)    History of present illness:  As per H&P written by Dr. Clementeen [name] on 07/11/17 84 y.o. male, with history of A. fib on chronic anticoagulation with Coumadin, chronic kidney disease stage III (baseline creatinine of 1.8), hypertension, hypothyroidism, type 2 diabetes mellitus who was hospitalized from 3/6-3/11 with hypotension and weakness with systolic blood pressure reported by EMS of 60s  improved with fluids on site.  He was seen in the ED twice before for weakness and blood pressure medication adjusted during that time. He was found to have some dramatic anemia with hemoglobin of 7 (dropped from baseline of 9-11) and received 6 unit PRBC and also given vitamin K along with FFP.  He underwent EGD showing small hiatal hernia and nonbleeding erosive gastropathy and duodenal diverticulum.  Also had a colonoscopy which showed diverticulosis.  He was also hypotensive during EGD and colonoscopy requiring pressors bleed briefly.  He was held off Coumadin until further discussion and discharged to SNF. History provided by son at bedside who informs that patient was discharged to SNF but his girlfriend brought him home 4 days back (patient signed himself out) as they were planning to get married today.  On the second day after returning home he slumped on the floor and his girlfriend called his son to help him get up.  The son also mentions that patient did not get his medications filled after leaving the SNF.  He again had a fall at home 2 days back without sustaining any injury.  He reportedly has been having poor p.o. intake as well. He was taken to see his cardiologist yesterday where his vitals were stable and his cardiologist discontinued his amiodarone and suggested he was not candidate for long-term anticoagulation.  He was recommended to continue low-dose Lasix as needed for leg edema. This morning the social worker visited his house for evaluation and found him on the floor for unknown duration.  Patient was also not communicating that well.  EMS was then called and patient brought to the ED. I am unable to get much history from the  patient as he is poorly communicative.  He was found to be short of breath but denied any chest pain.  No documented fever, nausea, vomiting or diarrhea at home.  No witnessed seizures.  Hospital Course:  Severe sepsis with septic shock ([place]) -Probably due to  healthcare associated vs aspiration pneumonia.   -Sepsis resolved.   -Patient off levofed drip and IV hydrocortisone. -Antibiotics now transition to oral Augmentin. -Blood cultures and flu by PCR negative -Narrow antibiotics to oral Augmentin.   HCAP (healthcare-associated pneumonia) -As mentioned above with concerns for aspiration pneumonia as well. -Speech therapy has evaluated patient and found him to be mild aspiration risk; recommending dysphagia 3 with thin liquids. -Patient antibiotic has been transitioned to oral regimen to complete therapy (using Augmentin) -Patient advised to keep himself well-hydrated repeat chest x-ray in 4-6 weeks to assure complete resolution of infiltrates. -continue inhaler   Acute respiratory failure with hypoxia: In the setting of pneumonia and acute on chronic diastolic heart failure -Treatment for pneumonia as mentioned above -For his diastolic heart failure IV Lasix was provided and the patient instructed to follow low-sodium diet. -At discharge he was euvolemic and following cardiology recommendations we will continue the use of Lasix on as-needed basis.  Acute toxic metabolic encephalopathy -Secondary to septic shock.   -Mentation back to baseline. -EEG negative for seizures.   -Head CT unremarkable.  Chronic Orthostatic Hypotension -Chronic. -continue the use of Midodrine  -use of TED hose recommended   Type 2 diabetes mellitus with diabetic nephropathy (HCC) -control with modified carb diet  -follow CBG's and A1C as an outpatient; initiate treatment with hypoglycemic regimen as needed   Chronic kidney disease (CKD), stage III (moderate) (HCC) -Stable and at baseline. -follow BMET  Hypothyroidism -TSH stable.   -Continue Synthroid..  Transaminitis -Elevated AST; Possibly from sepsis.   -repeat CMET at follow up visit Monitor for now.  Rhabdomyolysis. -Possibly from being on the floor for several hours.   -CPK  improving/resolved. - EEG neg for acute seizure -advise to maintain adequate hydration   Paroxysmal A. fib(HCC) -Recently taken off Coumadin due to blood loss anemia.  -Amiodarone discontinued by cardiology during outpatient visit on 3/28.  - -patient is Off beta-blocker as well. -follow up with cardiology service as an outpatient   COPD -no wheezing and with good O2 sat -continue home inhaler regimen   Iron deficiency anemia -Resume iron supplement. -repeat CBC as an outpatient to monitor Hgb trend   Prolonged QTC -Avoid QT prolonging agents.   -follow up potassium and magnesium, replete further as needed   Physical deconditioning -Patient discharged to skilled nursing facility for further care and rehabilitation as recommended by physical therapy assessment.  Procedures:  EEG: normal; Demonstrating no acute epileptic waves.  Consultations:  Cardiology  Critical care  Discharge Exam: Vitals:   07/16/17 0835 07/16/17 1307  BP:  (!) 112/52  Pulse:  (!) 55  Resp:  18  Temp:  (!) 97.5 F (36.4 C)  SpO2: 98% 100%    General: Afebrile, in no distress, breathing comfortable and denying CP. Cardiovascular: Regular rate, no rubs, no gallops, no murmurs, no JVD appreciated on exam. Respiratory: Scattered rhonchi, no wheezing, no frank crackles, improved air movement bilaterally. Abdomen: Soft, nontender, nondistended, positive bowel sounds Extremities: Trace edema bilaterally, no cyanosis  Discharge Instructions   Discharge Instructions    Diet - low sodium heart healthy   Complete by:  As directed    Discharge instructions   Complete by:  As directed    Medications as prescribed Follow low-sodium diet (less than 2 g daily) Maintain adequate hydration Complete antibiotics as instructed (4 more days pending at the moment of discharge). Arrange follow-up with PCP in 10 days after discharge.     Allergies as of 07/16/2017   No Known Allergies      Medication List    STOP taking these medications   diphenhydrAMINE 12.5 MG/5ML elixir Commonly known as:  BENADRYL   MELATONIN PO     TAKE these medications   albuterol 108 (90 Base) MCG/ACT inhaler Commonly known as:  PROVENTIL HFA;VENTOLIN HFA Inhale 1-2 puffs into the lungs every 6 (six) hours as needed for wheezing or shortness of breath.   amoxicillin-clavulanate 875-125 MG tablet Commonly known as:  AUGMENTIN Take 1 tablet by mouth every 12 (twelve) hours for 4 days.   b complex vitamins tablet Take 1 tablet by mouth daily.   dextromethorphan-guaiFENesin 30-600 MG 12hr tablet Commonly known as:  MUCINEX DM Take 1 tablet by mouth 2 (two) times daily.   ferrous sulfate 325 (65 FE) MG tablet Take 1 tablet (325 mg total) by mouth daily.   furosemide 20 MG tablet Commonly known as:  LASIX Take 20 mg daily as needed for leg swelling   levothyroxine 50 MCG tablet Commonly known as:  SYNTHROID, LEVOTHROID Take 1 tablet by mouth daily.   midodrine 2.5 MG tablet Commonly known as:  PROAMATINE Take 1 tablet (2.5 mg total) by mouth 3 (three) times daily with meals.   pantoprazole 40 MG tablet Commonly known as:  PROTONIX Take 1 tablet (40 mg total) by mouth daily.   STIOLTO RESPIMAT 2.5-2.5 MCG/ACT Aers Generic drug:  Tiotropium Bromide-Olodaterol Inhale 1 puff into the lungs daily.   zolpidem 5 MG tablet Commonly known as:  AMBIEN Take 1 tablet (5 mg total) by mouth at bedtime as needed for sleep.      No Known Allergies Follow-up Information    Brett Gravel, MD. Schedule an appointment as soon as possible for a visit in 10 day(s).   Specialty:  Internal Medicine Contact information: [address] [place] [address] [phone]        Herminio Commons, MD .   Specialty:  Cardiology Contact information: [place] [address] [phone]           The results of significant diagnostics from this  hospitalization (including imaging, microbiology, ancillary and laboratory) are listed below for reference.    Significant Diagnostic Studies: Dg Chest 2 View  Result Date: 06/18/2017 CLINICAL DATA:  Pt brought in from PCP by EMS due to hypotension. Pt was to be seen due dizziness and falls for weeks. Pt seen here and placed on iron pills. BP in PCP office initially 68/40 . EMS reports 90/62. Denies pain. Pt reports dizzine.*comment was truncated* EXAM: CHEST - 2 VIEW COMPARISON:  06/08/2017 FINDINGS: Normal cardiac silhouette. Lungs are mildly hyperinflated. No effusion, infiltrate or pneumothorax. Calcified pulmonary nodules unchanged from prior. IMPRESSION: No acute cardiopulmonary process. Electronically Signed   By: Suzy Bouchard M.D.   On: 06/18/2017 12:48   Ct Head Wo Contrast  Result Date: 07/11/2017 CLINICAL DATA:  Multiple falls today. EXAM: CT HEAD WITHOUT CONTRAST TECHNIQUE: Contiguous axial images were obtained from the base of the skull through the vertex without intravenous contrast. COMPARISON:  06/18/2017 FINDINGS: Brain: Stable age related cerebral atrophy, ventriculomegaly and periventricular white matter disease. No extra-axial fluid collections are identified. No  CT findings for acute hemispheric infarction or intracranial hemorrhage. No mass lesions. The brainstem and cerebellum are normal. Vascular: No hyperdense vessel or unexpected calcification. Skull: No skull fracture or bone lesion. Sinuses/Orbits: Ethmoid and maxillary sinus disease. The frontal sinuses are clear and the mastoid air cells and middle ear cavities are clear. Other: No scalp lesion or hematoma. IMPRESSION: 1. Age related cerebral atrophy, ventriculomegaly and periventricular white matter disease. 2. No acute intracranial findings or skull fracture. 3. Maxillary and ethmoid sinus disease. Electronically Signed   By: Marijo Sanes M.D.   On: 07/11/2017 13:38   Ct Head Wo Contrast  Result Date:  06/18/2017 CLINICAL DATA:  Head trauma.  Hypotension. Initial encounter. EXAM: CT HEAD WITHOUT CONTRAST TECHNIQUE: Contiguous axial images were obtained from the base of the skull through the vertex without intravenous contrast. COMPARISON:  None. FINDINGS: Brain: No evidence of acute infarction, hemorrhage, hydrocephalus, extra-axial collection or mass lesion/mass effect. Nonspecific atrophy with ventriculomegaly. Chronic small vessel ischemia in the periventricular white matter. Vascular: Atherosclerotic calcification.  No hyperdense vessel. Skull: Negative Sinuses/Orbits: No acute finding.  Left cataract resection. IMPRESSION: No evidence of intracranial injury. Electronically Signed   By: Monte Fantasia M.D.   On: 06/18/2017 12:09   Dg Chest Port 1 View  Result Date: 07/11/2017 CLINICAL DATA:  Weakness, hypotension, coronary artery disease. EXAM: PORTABLE CHEST 1 VIEW COMPARISON:  Chest x-rays dated 06/18/2017 and 06/01/2017. FINDINGS: New opacity at the medial aspects of the right lung base, pneumonia versus asymmetric edema. There is mild bilateral interstitial prominence suggesting interstitial edema. Stable cardiomegaly. IMPRESSION: 1. Cardiomegaly with interstitial thickening, presumed interstitial edema, suggesting mild CHF/volume overload. 2. More confluent opacity at the medial right lung base could represent asymmetric pulmonary edema or pneumonia, favor edema if afebrile. Electronically Signed   By: Franki Cabot M.D.   On: 07/11/2017 13:24   Dg Chest Port 1v Same Day  Result Date: 07/11/2017 CLINICAL DATA:  Central line placement. EXAM: PORTABLE CHEST 1 VIEW COMPARISON:  07/11/2017 at 12:58 p.m. FINDINGS: Interval placement of right IJ central venous catheter with tip over the SVC. Lungs are adequately inflated with mild prominence of the perihilar markings suggesting mild vascular congestion. No pneumothorax. Improved hazy opacification over the right base. No evidence of effusion. Mild  stable cardiomegaly. Remainder of the exam is unchanged. IMPRESSION: Findings suggesting mild vascular congestion with mild stable cardiomegaly. Right IJ central venous catheter with tip over the SVC. No pneumothorax. Electronically Signed   By: Marin Olp M.D.   On: 07/11/2017 15:58   Dg Swallowing Func-speech Pathology  Result Date: 07/16/2017 Objective Swallowing Evaluation: Type of Study: MBS-Modified Barium Swallow Study  Patient Details Name: CLEVLAND CORK MRN: [id number] Date of Birth: April 26, 1932 Today's Date: 07/16/2017 Time: SLP Start Time (ACUTE ONLY): 0948 -SLP Stop Time (ACUTE ONLY): 1022 SLP Time Calculation (min) (ACUTE ONLY): 34 min Past Medical History: Past Medical History: Diagnosis Date . Atrial fibrillation ([place])   a. s/p DCCV in 12/2016, remains on Coumadin and Amiodarone . CAD (coronary artery disease)   a. s/p POBA of D1 in 2009 with residual 20-30% stenosis along RCA and LAD . Chronic kidney disease (CKD), stage III (moderate) (HCC)  . Dysrhythmia   AFib . Essential hypertension, benign  . History of cardiac catheterization   Details not certain - reportedly no interventions . Hypothyroidism  . Macular degeneration  . Pulmonary hypertension ([place]) 12/03/2013  PA pressure 56. Ejection fraction 55-60% . Type 2 diabetes mellitus [facility])  Past Surgical  History: Past Surgical History: Procedure Laterality Date . CARDIOVERSION N/A 12/10/2013  Procedure: CARDIOVERSION;  Surgeon: Herminio Commons, MD;  Location: [facility];  Service: Endoscopy;  Laterality: N/A; . CARDIOVERSION N/A 11/15/2016  Procedure: CARDIOVERSION;  Surgeon: Arnoldo Lenis, MD;  Location: [facility];  Service: Endoscopy;  Laterality: N/A; . CARDIOVERSION N/A 12/18/2016  Procedure: CARDIOVERSION;  Surgeon: Arnoldo Lenis, MD;  [facility];  Service: Endoscopy;  Laterality: N/A;  pt knows to arrive at 8:00 . CATARACT EXTRACTION W/PHACO Left 11/13/2015  Procedure: CATARACT EXTRACTION PHACO AND INTRAOCULAR LENS PLACEMENT  LEFT EYE CDE=7.76;  Surgeon: Williams Che, MD;  Location: [facility];  Service: Ophthalmology;  Laterality: Left; . CHOLECYSTECTOMY   . COLONOSCOPY    Remote > 10 years ago . COLONOSCOPY WITH PROPOFOL N/A 06/21/2017  Procedure: COLONOSCOPY WITH PROPOFOL;  Surgeon: Clarene Essex, MD;  Location: [facility];  Service: Endoscopy;  Laterality: N/A; . ERCP N/A 10/17/2016  Dr. Gala Romney: sphincterotomy and stone extraction  . ESOPHAGOGASTRODUODENOSCOPY (EGD) WITH PROPOFOL N/A 06/21/2017  Procedure: ESOPHAGOGASTRODUODENOSCOPY (EGD) WITH PROPOFOL;  Surgeon: Clarene Essex, MD;  Location: [facility];  Service: Endoscopy;  Laterality: N/A; . REMOVAL OF STONES  10/17/2016  Procedure: REMOVAL OF STONES with balloon;  Surgeon: Daneil Dolin, MD;  [facility];  Service: Endoscopy;; . REPLACEMENT TOTAL HIP W/  RESURFACING IMPLANTS Bilateral  . SPHINCTEROTOMY  10/17/2016  Procedure: BILLARY SPHINCTEROTOMY with ballon dilation;  Surgeon: Daneil Dolin, MD;  [facility];  Service: Endoscopy;; . TEE WITHOUT CARDIOVERSION N/A 12/10/2013  Procedure: TRANSESOPHAGEAL ECHOCARDIOGRAM (TEE);  Surgeon: Herminio Commons, MD;  Location: [facility];  Service: Endoscopy;  Laterality: N/A; HPI: Please refer to admission H&P for details, in brief, 84 year old male with A. fib on chronic anticoagulation, CKD stage III, hypertension, hypothyroidism, diabetes mellitus with recent hospitalization about 2 weeks back with hypotension and weakness with blood pressure dropping to 60s per EMS and improved with fluids.  He also had significant anemia requiring almost 6 unit PRBC along with FFP.  EGD done showed nonbleeding erosive gastropathy and diverticulosis on colonoscopy.  Coumadin was discontinued upon discharge.  Patient sent to SNF but signed himself out few days prior to this admission.  He was living with his girlfriend and per his son had few episodes of fall at home and also did not refill his prescription. BSE requested. Pt had EGD  06/21/17  Subjective: "I want regular food." Assessment / Plan / Recommendation CHL IP CLINICAL IMPRESSIONS 07/16/2017 Clinical Impression Pt presents with mild to mild/mod oropharyngeal phase dysphagia with suspected esophageal component. Oral phase is marked by prolonged oral transit with dry solids and piecemeal deglutition likely due to Pt with upper dentures and no lower dentition. Pharyngeal phase is marked by premature spillage to the level of the pyriforms with thin liquids via tsp/cup/straw, mildly reduced hyolaryngeal excursion, prominent cricopharyngeal segment with reduced relaxation resulting in trace, flash penetration during the swallow with large cup sips and straw sips, no aspiration, retention near CP across all consistencies with backflow into pyriforms (usually tail end of bolus). The barium tablet was swallowed whole with cup sip of thin liquid and stopped very briefly near CP, before passing through esophagus. Esophageal sweep revealed what appeared to be a hiatal hernia, although no radiologist present to confirm (previously identified on EGD in early March at [facility]). Pt with reduced awareness/sensation of residuals in pyriforms. Pt also appeared to have bony prominences along C4-7 which may also impact residuals near  CP/pyriforms. Recommend D3/mech soft with thin liquids via cup sip, avoid straws, swallow 2-3x for each bite/sip, and clear throat periodically. Pt can likely be advanced to regular textures per treating SLP at SNF. Pt denies dysphagia symptoms/globus sensation, however Pt does present with residuals across all consistencies/textures post swallow from backflow near CP. Could try some strengthening exercises and additional education regarding aspiration precautions as it relates to individuals with respiratory difficulties at SNF. SLP will sign off during acute stay and recommend f/u at SNF.  SLP Visit Diagnosis Dysphagia, oropharyngeal phase (R13.12);Dysphagia, pharyngoesophageal  phase (R13.14) Attention and concentration deficit following -- Frontal lobe and executive function deficit following -- Impact on safety and function Mild aspiration risk   CHL IP TREATMENT RECOMMENDATION 07/16/2017 Treatment Recommendations Therapy as outlined in treatment plan below   Prognosis 07/16/2017 Prognosis for Safe Diet Advancement Fair Barriers to Reach Goals Cognitive deficits Barriers/Prognosis Comment -- CH[facility] IP DIET RECOMMENDATION 07/16/2017 SLP Diet Recommendations Regular solids;Thin liquid;Dysphagia 3 (Mech soft) solids Liquid Administration via Cup;No straw Medication Administration Whole meds with puree Compensations Small sips/bites;Multiple dry swallows after each bite/sip;Clear throat intermittently Postural Changes Remain semi-upright after after feeds/meals (Comment);Seated upright at 90 degrees   CHL IP OTHER RECOMMENDATIONS 07/16/2017 Recommended Consults -- Oral Care Recommendations Oral care BID;Patient independent with oral care Other Recommendations Clarify dietary restrictions   CH[facility] IP FOLLOW UP RECOMMENDATIONS 07/16/2017 Follow up Recommendations Skilled Nursing facility   [facility] IP FREQUENCY AND DURATION 07/16/2017 Speech Therapy Frequency (ACUTE ONLY) min 2x/week Treatment Duration 1 week      CHL IP ORAL PHASE 07/16/2017 Oral Phase Impaired Oral - Pudding Teaspoon -- Oral - Pudding Cup -- Oral - Honey Teaspoon -- Oral - Honey Cup -- Oral - Nectar Teaspoon -- Oral - Nectar Cup -- Oral - Nectar Straw -- Oral - Thin Teaspoon -- Oral - Thin Cup -- Oral - Thin Straw -- Oral - Puree -- Oral - Mech Soft -- Oral - Regular Lingual/palatal residue;Piecemeal swallowing;Delayed oral transit Oral - Multi-Consistency -- Oral - Pill -- Oral Phase - Comment --  CHL IP PHARYNGEAL PHASE 07/16/2017 Pharyngeal Phase Impaired Pharyngeal- Pudding Teaspoon -- Pharyngeal -- Pharyngeal- Pudding Cup -- Pharyngeal -- Pharyngeal- Honey Teaspoon -- Pharyngeal -- Pharyngeal- Honey Cup -- Pharyngeal -- Pharyngeal- Nectar  Teaspoon -- Pharyngeal -- Pharyngeal- Nectar Cup Delayed swallow initiation-vallecula;Delayed swallow initiation-pyriform sinuses;Reduced epiglottic inversion;Reduced anterior laryngeal mobility;Reduced laryngeal elevation;Pharyngeal residue - pyriform;Pharyngeal residue - cp segment Pharyngeal -- Pharyngeal- Nectar Straw -- Pharyngeal -- Pharyngeal- Thin Teaspoon Delayed swallow initiation-pyriform sinuses;Pharyngeal residue - cp segment;Pharyngeal residue - pyriform Pharyngeal -- Pharyngeal- Thin Cup Delayed swallow initiation-pyriform sinuses;Reduced epiglottic inversion;Reduced anterior laryngeal mobility;Reduced laryngeal elevation;Penetration/Aspiration during swallow;Pharyngeal residue - valleculae;Pharyngeal residue - pyriform;Pharyngeal residue - cp segment Pharyngeal Material does not enter airway;Material enters airway, remains ABOVE vocal cords then ejected out Pharyngeal- Thin Straw Delayed swallow initiation-pyriform sinuses;Reduced epiglottic inversion;Reduced anterior laryngeal mobility;Reduced laryngeal elevation;Penetration/Aspiration during swallow;Pharyngeal residue - valleculae;Pharyngeal residue - pyriform;Pharyngeal residue - cp segment Pharyngeal Material does not enter airway;Material enters airway, remains ABOVE vocal cords then ejected out;Material enters airway, remains ABOVE vocal cords and not ejected out Pharyngeal- Puree Delayed swallow initiation-vallecula;Reduced epiglottic inversion;Reduced anterior laryngeal mobility;Reduced laryngeal elevation;Pharyngeal residue - cp segment;Pharyngeal residue - pyriform Pharyngeal -- Pharyngeal- Mechanical Soft -- Pharyngeal -- Pharyngeal- Regular Delayed swallow initiation-vallecula;Reduced epiglottic inversion;Reduced anterior laryngeal mobility;Reduced laryngeal elevation;Pharyngeal residue - cp segment;Pharyngeal residue - pyriform Pharyngeal -- Pharyngeal- Multi-consistency -- Pharyngeal -- Pharyngeal- Pill Delayed swallow  initiation-vallecula;Reduced epiglottic inversion;Reduced anterior laryngeal mobility;Reduced laryngeal elevation  Pharyngeal -- Pharyngeal Comment mildly reduced hyolarngeal excusion, prominent CP with residuals and backflow to pyriforms  CHL IP CERVICAL ESOPHAGEAL PHASE 07/16/2017 Cervical Esophageal Phase Impaired Pudding Teaspoon -- Pudding Cup -- Honey Teaspoon -- Honey Cup -- Nectar Teaspoon -- Nectar Cup -- Nectar Straw -- Thin Teaspoon -- Thin Cup -- Thin Straw -- Puree Reduced cricopharyngeal relaxation;Prominent cricopharyngeal segment;Esophageal backflow into the pharynx Mechanical Soft -- Regular -- Multi-consistency -- Pill -- Cervical Esophageal Comment prominent CP segment with reduced relaxation resulting in CP residuals and backflow into pyriforms Thank you, Genene Churn, [place] No flowsheet data found. PORTER,DABNEY 07/16/2017, 11:51 AM               Microbiology: Recent Results (from the past 240 hour(s))  Urine culture     Status: None   Collection Time: 07/11/17 12:30 PM  Result Value Ref Range Status   Specimen Description   Final    URINE, RANDOM Performed at [facility], [address], [place], [place] [address]    Special Requests   Final    URINE, RANDOM Performed at [facility], [address], [place], [place] [address]    Culture   Final    NO GROWTH Performed at [facility], [place] [address], [place], [place] [address]    Report Status 07/12/2017 FINAL  Final  Blood Culture (routine x 2)     Status: None   Collection Time: 07/11/17 12:41 PM  Result Value Ref Range Status   Specimen Description BLOOD RIGHT ARM  Final   Special Requests   Final    BOTTLES DRAWN AEROBIC AND ANAEROBIC Blood Culture adequate volume   Culture   Final    NO GROWTH 5 DAYS Performed at [facility] [facility], [address] [place], [place] [address]    Report Status 07/16/2017 FINAL  Final  Blood Culture (routine x 2)     Status: None   Collection Time: 07/11/17 12:44 PM   Result Value Ref Range Status   Specimen Description BLOOD RIGHT HAND  Final   Special Requests   Final    BOTTLES DRAWN AEROBIC AND ANAEROBIC Blood Culture adequate volume   Culture   Final    NO GROWTH 5 DAYS Performed at [facility], [address], [place], [place] [address]    Report Status 07/16/2017 FINAL  Final  MRSA PCR Screening     Status: None   Collection Time: 07/11/17  5:00 PM  Result Value Ref Range Status   MRSA by PCR NEGATIVE NEGATIVE Final    Comment:        The GeneXpert MRSA Assay (FDA approved for NASAL specimens only), is one component of a comprehensive MRSA colonization surveillance program. It is not intended to diagnose MRSA infection nor to guide or monitor treatment for MRSA infections. Performed at [facility], [address] [place],  [address]      Labs: Basic Metabolic Panel: Recent Labs  Lab 07/11/17 1235 07/11/17 1256 07/11/17 1746 07/12/17 0423 07/13/17 0403 07/14/17 0457 07/16/17 0511  NA 134* 136  --  136 135  --  139  K 3.8 3.8  --  4.9 4.6  --  3.6  CL 101 98*  --  102 101  --  104  CO2 22  --   --  16* 18*  --  26  GLUCOSE 81 76  --  117* 117*  --  97  BUN 15 13  --  18 28*  --  21*  CREATININE 1.58* 1.60*  --  1.75* 1.66* 1.56* 1.49*  CALCIUM 7.8*  --   --  7.5* 7.9*  --  8.0*  MG  --   --  1.4*  --  2.0  --   --    Liver Function Tests: Recent Labs  Lab 07/11/17 1235 07/12/17 0423 07/13/17 0403  AST 87* 100* 80*  ALT 28 32 34  ALKPHOS 78 77 77  BILITOT 1.6* 1.4* 1.1  PROT 5.1* 5.2* 5.6*  ALBUMIN 2.7* 2.7* 2.9*   Recent Labs  Lab 07/11/17 1618  AMMONIA 45*   CBC: Recent Labs  Lab 07/11/17 1235 07/11/17 1256 07/11/17 1746 07/12/17 0423 07/13/17 0403  WBC 4.1  --  3.2* 3.8* 3.7*  NEUTROABS 2.7  --  2.3  --   --   HGB 9.9* 9.5* 9.3* 9.7* 9.4*  HCT 30.2* 28.0* 28.0* 30.4* 28.8*  MCV 88.0  --  88.6 90.2 87.0  PLT 134*  --  134* 188 179   Cardiac Enzymes: Recent Labs  Lab 07/11/17 1235  07/11/17 1240 07/12/17 0639  CKTOTAL  --  1,488* 610*  TROPONINI 0.03*  --   --    BNP: BNP (last 3 results) Recent Labs    06/01/17 1221 06/08/17 1058 07/11/17 1240  BNP 282.0* 376.0* 471.0*   CBG: Recent Labs  Lab 07/15/17 1706 07/15/17 2014 07/16/17 0436 07/16/17 0712 07/16/17 1135  GLUCAP 101* 118* 88 84 89    Signed:  Barton Dubois MD.  [facility] 07/16/2017, 1:50 PM

## 2017-07-16 NOTE — Clinical Social Work Placement (Signed)
   CLINICAL SOCIAL WORK PLACEMENT  NOTE  Date:  07/16/2017  Patient Details  Name: Brett [name] MRN: [id number] Date of Birth: [birth year]  Clinical Social Work is seeking post-discharge placement for this patient at the [place] level of care (*CSW will initial, date and re-position this form in  chart as items are completed):  Yes   Patient/family provided with [facility] Work Department's list of facilities offering this level of care within the geographic area requested by the patient (or if unable, by the patient's family).  Yes   Patient/family informed of their freedom to choose among providers that offer the needed level of care, that participate in Medicare, Medicaid or managed care program needed by the patient, have an available bed and are willing to accept the patient.  Yes   Patient/family informed of [facility]'s ownership interest in [facility] and [facility], as well as of the fact that they are under no obligation to receive care at these facilities.  PASRR submitted to EDS on       PASRR number received on       Existing PASRR number confirmed on 07/15/17     FL2 transmitted to all facilities in geographic area requested by pt/family on 07/15/17     FL2 transmitted to all facilities within larger geographic area on       Patient informed that his/her managed care company has contracts with or will negotiate with certain facilities, including the following:        Yes   Patient/family informed of bed offers received.  Patient chooses bed at [facility]     Physician recommends and patient chooses bed at      Patient to be transferred to [facility] on 07/16/17.  Patient to be transferred to facility by [facility]     Patient family notified on 07/16/17 of transfer.  Name of family member notified:  DIL, Melissa     PHYSICIAN       Additional Comment:  Discharge clinicals sent. LCSW signing  off.  _______________________________________________ Ihor Gully, LCSW 07/16/2017, 1:06 PM

## 2017-07-17 DIAGNOSIS — J690 Pneumonitis due to inhalation of food and vomit: Secondary | ICD-10-CM | POA: Diagnosis not present

## 2017-07-17 DIAGNOSIS — D649 Anemia, unspecified: Secondary | ICD-10-CM | POA: Diagnosis not present

## 2017-07-17 DIAGNOSIS — I482 Chronic atrial fibrillation: Secondary | ICD-10-CM | POA: Diagnosis not present

## 2017-07-17 DIAGNOSIS — J9601 Acute respiratory failure with hypoxia: Secondary | ICD-10-CM | POA: Diagnosis not present

## 2017-07-18 DIAGNOSIS — R6520 Severe sepsis without septic shock: Secondary | ICD-10-CM | POA: Diagnosis not present

## 2017-07-18 DIAGNOSIS — E039 Hypothyroidism, unspecified: Secondary | ICD-10-CM | POA: Diagnosis not present

## 2017-07-18 DIAGNOSIS — E118 Type 2 diabetes mellitus with unspecified complications: Secondary | ICD-10-CM | POA: Diagnosis not present

## 2017-07-18 DIAGNOSIS — I482 Chronic atrial fibrillation: Secondary | ICD-10-CM | POA: Diagnosis not present

## 2017-07-22 DIAGNOSIS — D509 Iron deficiency anemia, unspecified: Secondary | ICD-10-CM | POA: Diagnosis not present

## 2017-07-22 DIAGNOSIS — K5900 Constipation, unspecified: Secondary | ICD-10-CM | POA: Diagnosis not present

## 2017-07-22 DIAGNOSIS — I5032 Chronic diastolic (congestive) heart failure: Secondary | ICD-10-CM | POA: Diagnosis not present

## 2017-07-22 DIAGNOSIS — R200 Anesthesia of skin: Secondary | ICD-10-CM | POA: Diagnosis not present

## 2017-07-22 DIAGNOSIS — I482 Chronic atrial fibrillation: Secondary | ICD-10-CM | POA: Diagnosis not present

## 2017-07-22 DIAGNOSIS — R21 Rash and other nonspecific skin eruption: Secondary | ICD-10-CM | POA: Diagnosis not present

## 2017-07-24 DIAGNOSIS — X58XXXA Exposure to other specified factors, initial encounter: Secondary | ICD-10-CM | POA: Diagnosis not present

## 2017-07-24 DIAGNOSIS — I509 Heart failure, unspecified: Secondary | ICD-10-CM | POA: Diagnosis not present

## 2017-07-24 DIAGNOSIS — E1122 Type 2 diabetes mellitus with diabetic chronic kidney disease: Secondary | ICD-10-CM | POA: Diagnosis not present

## 2017-07-24 DIAGNOSIS — I4891 Unspecified atrial fibrillation: Secondary | ICD-10-CM | POA: Diagnosis not present

## 2017-07-24 DIAGNOSIS — J690 Pneumonitis due to inhalation of food and vomit: Secondary | ICD-10-CM | POA: Diagnosis not present

## 2017-07-24 DIAGNOSIS — N189 Chronic kidney disease, unspecified: Secondary | ICD-10-CM | POA: Diagnosis not present

## 2017-07-24 DIAGNOSIS — T7840XA Allergy, unspecified, initial encounter: Secondary | ICD-10-CM | POA: Diagnosis not present

## 2017-07-24 DIAGNOSIS — I130 Hypertensive heart and chronic kidney disease with heart failure and stage 1 through stage 4 chronic kidney disease, or unspecified chronic kidney disease: Secondary | ICD-10-CM | POA: Diagnosis not present

## 2017-07-24 DIAGNOSIS — E039 Hypothyroidism, unspecified: Secondary | ICD-10-CM | POA: Diagnosis not present

## 2017-07-24 DIAGNOSIS — L309 Dermatitis, unspecified: Secondary | ICD-10-CM | POA: Diagnosis not present

## 2017-07-24 DIAGNOSIS — L250 Unspecified contact dermatitis due to cosmetics: Secondary | ICD-10-CM | POA: Diagnosis not present

## 2017-07-31 DIAGNOSIS — D649 Anemia, unspecified: Secondary | ICD-10-CM | POA: Diagnosis not present

## 2017-07-31 DIAGNOSIS — E039 Hypothyroidism, unspecified: Secondary | ICD-10-CM | POA: Diagnosis not present

## 2017-07-31 DIAGNOSIS — N183 Chronic kidney disease, stage 3 (moderate): Secondary | ICD-10-CM | POA: Diagnosis not present

## 2017-07-31 DIAGNOSIS — I482 Chronic atrial fibrillation: Secondary | ICD-10-CM | POA: Diagnosis not present

## 2017-08-01 DIAGNOSIS — I2510 Atherosclerotic heart disease of native coronary artery without angina pectoris: Secondary | ICD-10-CM | POA: Diagnosis not present

## 2017-08-01 DIAGNOSIS — M150 Primary generalized (osteo)arthritis: Secondary | ICD-10-CM | POA: Diagnosis not present

## 2017-08-01 DIAGNOSIS — G3183 Dementia with Lewy bodies: Secondary | ICD-10-CM | POA: Diagnosis not present

## 2017-08-01 DIAGNOSIS — J449 Chronic obstructive pulmonary disease, unspecified: Secondary | ICD-10-CM | POA: Diagnosis not present

## 2017-08-08 DIAGNOSIS — I959 Hypotension, unspecified: Secondary | ICD-10-CM | POA: Diagnosis not present

## 2017-08-08 DIAGNOSIS — I4891 Unspecified atrial fibrillation: Secondary | ICD-10-CM | POA: Diagnosis not present

## 2017-08-08 DIAGNOSIS — E119 Type 2 diabetes mellitus without complications: Secondary | ICD-10-CM | POA: Diagnosis not present

## 2017-08-08 DIAGNOSIS — J449 Chronic obstructive pulmonary disease, unspecified: Secondary | ICD-10-CM | POA: Diagnosis not present

## 2017-08-08 DIAGNOSIS — I5020 Unspecified systolic (congestive) heart failure: Secondary | ICD-10-CM | POA: Diagnosis not present

## 2017-08-08 DIAGNOSIS — Z7982 Long term (current) use of aspirin: Secondary | ICD-10-CM | POA: Diagnosis not present

## 2017-08-08 DIAGNOSIS — Z7984 Long term (current) use of oral hypoglycemic drugs: Secondary | ICD-10-CM | POA: Diagnosis not present

## 2017-08-09 DIAGNOSIS — R2681 Unsteadiness on feet: Secondary | ICD-10-CM | POA: Diagnosis not present

## 2017-08-09 DIAGNOSIS — G4733 Obstructive sleep apnea (adult) (pediatric): Secondary | ICD-10-CM | POA: Diagnosis not present

## 2017-08-09 DIAGNOSIS — M6281 Muscle weakness (generalized): Secondary | ICD-10-CM | POA: Diagnosis not present

## 2017-08-22 DIAGNOSIS — R609 Edema, unspecified: Secondary | ICD-10-CM | POA: Diagnosis not present

## 2017-08-22 DIAGNOSIS — E119 Type 2 diabetes mellitus without complications: Secondary | ICD-10-CM | POA: Diagnosis not present

## 2017-08-22 DIAGNOSIS — D649 Anemia, unspecified: Secondary | ICD-10-CM | POA: Diagnosis not present

## 2017-08-22 DIAGNOSIS — J449 Chronic obstructive pulmonary disease, unspecified: Secondary | ICD-10-CM | POA: Diagnosis not present

## 2017-08-22 DIAGNOSIS — I480 Paroxysmal atrial fibrillation: Secondary | ICD-10-CM | POA: Diagnosis not present

## 2017-08-25 DIAGNOSIS — G4733 Obstructive sleep apnea (adult) (pediatric): Secondary | ICD-10-CM | POA: Diagnosis not present

## 2017-08-30 DIAGNOSIS — R2681 Unsteadiness on feet: Secondary | ICD-10-CM | POA: Diagnosis not present

## 2017-08-30 DIAGNOSIS — G4733 Obstructive sleep apnea (adult) (pediatric): Secondary | ICD-10-CM | POA: Diagnosis not present

## 2017-08-30 DIAGNOSIS — I5032 Chronic diastolic (congestive) heart failure: Secondary | ICD-10-CM | POA: Diagnosis not present

## 2017-08-30 DIAGNOSIS — M6281 Muscle weakness (generalized): Secondary | ICD-10-CM | POA: Diagnosis not present

## 2017-09-08 DIAGNOSIS — M6281 Muscle weakness (generalized): Secondary | ICD-10-CM | POA: Diagnosis not present

## 2017-09-08 DIAGNOSIS — G4733 Obstructive sleep apnea (adult) (pediatric): Secondary | ICD-10-CM | POA: Diagnosis not present

## 2017-09-19 DIAGNOSIS — R350 Frequency of micturition: Secondary | ICD-10-CM | POA: Diagnosis not present

## 2017-09-19 DIAGNOSIS — R6889 Other general symptoms and signs: Secondary | ICD-10-CM | POA: Diagnosis not present

## 2017-09-19 DIAGNOSIS — G8310 Monoplegia of lower limb affecting unspecified side: Secondary | ICD-10-CM | POA: Diagnosis not present

## 2017-09-19 DIAGNOSIS — R031 Nonspecific low blood-pressure reading: Secondary | ICD-10-CM | POA: Diagnosis not present

## 2017-09-19 DIAGNOSIS — D649 Anemia, unspecified: Secondary | ICD-10-CM | POA: Diagnosis not present

## 2017-09-19 DIAGNOSIS — J449 Chronic obstructive pulmonary disease, unspecified: Secondary | ICD-10-CM | POA: Diagnosis not present

## 2017-09-19 DIAGNOSIS — I480 Paroxysmal atrial fibrillation: Secondary | ICD-10-CM | POA: Diagnosis not present

## 2017-09-23 DIAGNOSIS — R29898 Other symptoms and signs involving the musculoskeletal system: Secondary | ICD-10-CM | POA: Diagnosis not present

## 2017-09-23 DIAGNOSIS — R031 Nonspecific low blood-pressure reading: Secondary | ICD-10-CM | POA: Diagnosis not present

## 2017-09-23 DIAGNOSIS — D649 Anemia, unspecified: Secondary | ICD-10-CM | POA: Diagnosis not present

## 2017-09-23 DIAGNOSIS — J449 Chronic obstructive pulmonary disease, unspecified: Secondary | ICD-10-CM | POA: Diagnosis not present

## 2017-09-23 DIAGNOSIS — I4891 Unspecified atrial fibrillation: Secondary | ICD-10-CM | POA: Diagnosis not present

## 2017-09-29 DIAGNOSIS — J449 Chronic obstructive pulmonary disease, unspecified: Secondary | ICD-10-CM | POA: Diagnosis not present

## 2017-09-29 DIAGNOSIS — R6889 Other general symptoms and signs: Secondary | ICD-10-CM | POA: Diagnosis not present

## 2017-09-29 DIAGNOSIS — L570 Actinic keratosis: Secondary | ICD-10-CM | POA: Diagnosis not present

## 2017-09-29 DIAGNOSIS — E871 Hypo-osmolality and hyponatremia: Secondary | ICD-10-CM | POA: Diagnosis not present

## 2017-09-30 DIAGNOSIS — M6281 Muscle weakness (generalized): Secondary | ICD-10-CM | POA: Diagnosis not present

## 2017-09-30 DIAGNOSIS — I5032 Chronic diastolic (congestive) heart failure: Secondary | ICD-10-CM | POA: Diagnosis not present

## 2017-09-30 DIAGNOSIS — G4733 Obstructive sleep apnea (adult) (pediatric): Secondary | ICD-10-CM | POA: Diagnosis not present

## 2017-09-30 DIAGNOSIS — R2681 Unsteadiness on feet: Secondary | ICD-10-CM | POA: Diagnosis not present

## 2017-10-09 DIAGNOSIS — M6281 Muscle weakness (generalized): Secondary | ICD-10-CM | POA: Diagnosis not present

## 2017-10-09 DIAGNOSIS — G4733 Obstructive sleep apnea (adult) (pediatric): Secondary | ICD-10-CM | POA: Diagnosis not present

## 2017-10-20 DIAGNOSIS — I10 Essential (primary) hypertension: Secondary | ICD-10-CM | POA: Diagnosis not present

## 2017-10-20 DIAGNOSIS — E119 Type 2 diabetes mellitus without complications: Secondary | ICD-10-CM | POA: Diagnosis not present

## 2017-10-20 DIAGNOSIS — Z87891 Personal history of nicotine dependence: Secondary | ICD-10-CM | POA: Diagnosis not present

## 2017-10-20 DIAGNOSIS — Z7984 Long term (current) use of oral hypoglycemic drugs: Secondary | ICD-10-CM | POA: Diagnosis not present

## 2017-10-20 DIAGNOSIS — I4891 Unspecified atrial fibrillation: Secondary | ICD-10-CM | POA: Diagnosis not present

## 2017-10-20 DIAGNOSIS — W19XXXA Unspecified fall, initial encounter: Secondary | ICD-10-CM | POA: Diagnosis not present

## 2017-10-20 DIAGNOSIS — S51811A Laceration without foreign body of right forearm, initial encounter: Secondary | ICD-10-CM | POA: Diagnosis not present

## 2017-10-20 DIAGNOSIS — R0781 Pleurodynia: Secondary | ICD-10-CM | POA: Diagnosis not present

## 2017-10-20 DIAGNOSIS — Z7982 Long term (current) use of aspirin: Secondary | ICD-10-CM | POA: Diagnosis not present

## 2017-10-20 DIAGNOSIS — W010XXA Fall on same level from slipping, tripping and stumbling without subsequent striking against object, initial encounter: Secondary | ICD-10-CM | POA: Diagnosis not present

## 2017-10-20 DIAGNOSIS — Z96643 Presence of artificial hip joint, bilateral: Secondary | ICD-10-CM | POA: Diagnosis not present

## 2017-10-20 DIAGNOSIS — S51011A Laceration without foreign body of right elbow, initial encounter: Secondary | ICD-10-CM | POA: Diagnosis not present

## 2017-10-21 DIAGNOSIS — W010XXA Fall on same level from slipping, tripping and stumbling without subsequent striking against object, initial encounter: Secondary | ICD-10-CM | POA: Diagnosis not present

## 2017-10-21 DIAGNOSIS — S51811A Laceration without foreign body of right forearm, initial encounter: Secondary | ICD-10-CM | POA: Diagnosis not present

## 2017-10-21 DIAGNOSIS — E119 Type 2 diabetes mellitus without complications: Secondary | ICD-10-CM | POA: Diagnosis not present

## 2017-10-21 DIAGNOSIS — I10 Essential (primary) hypertension: Secondary | ICD-10-CM | POA: Diagnosis not present

## 2017-10-21 DIAGNOSIS — Z7984 Long term (current) use of oral hypoglycemic drugs: Secondary | ICD-10-CM | POA: Diagnosis not present

## 2017-10-21 DIAGNOSIS — Z7982 Long term (current) use of aspirin: Secondary | ICD-10-CM | POA: Diagnosis not present

## 2017-10-21 DIAGNOSIS — Z87891 Personal history of nicotine dependence: Secondary | ICD-10-CM | POA: Diagnosis not present

## 2017-10-21 DIAGNOSIS — Z96643 Presence of artificial hip joint, bilateral: Secondary | ICD-10-CM | POA: Diagnosis not present

## 2017-10-21 DIAGNOSIS — R0781 Pleurodynia: Secondary | ICD-10-CM | POA: Diagnosis not present

## 2017-10-21 DIAGNOSIS — I4891 Unspecified atrial fibrillation: Secondary | ICD-10-CM | POA: Diagnosis not present

## 2017-10-29 DIAGNOSIS — J449 Chronic obstructive pulmonary disease, unspecified: Secondary | ICD-10-CM | POA: Diagnosis not present

## 2017-10-29 DIAGNOSIS — R6889 Other general symptoms and signs: Secondary | ICD-10-CM | POA: Diagnosis not present

## 2017-10-29 DIAGNOSIS — E119 Type 2 diabetes mellitus without complications: Secondary | ICD-10-CM | POA: Diagnosis not present

## 2017-10-29 DIAGNOSIS — E871 Hypo-osmolality and hyponatremia: Secondary | ICD-10-CM | POA: Diagnosis not present

## 2017-10-30 DIAGNOSIS — G4733 Obstructive sleep apnea (adult) (pediatric): Secondary | ICD-10-CM | POA: Diagnosis not present

## 2017-10-30 DIAGNOSIS — R2681 Unsteadiness on feet: Secondary | ICD-10-CM | POA: Diagnosis not present

## 2017-10-30 DIAGNOSIS — I5032 Chronic diastolic (congestive) heart failure: Secondary | ICD-10-CM | POA: Diagnosis not present

## 2017-10-30 DIAGNOSIS — M6281 Muscle weakness (generalized): Secondary | ICD-10-CM | POA: Diagnosis not present

## 2017-11-08 DIAGNOSIS — M6281 Muscle weakness (generalized): Secondary | ICD-10-CM | POA: Diagnosis not present

## 2017-11-08 DIAGNOSIS — G4733 Obstructive sleep apnea (adult) (pediatric): Secondary | ICD-10-CM | POA: Diagnosis not present

## 2018-03-15 DEATH — deceased

## 2018-11-11 IMAGING — DX DG CHEST 2V
2 series · 2 of 2 positions shown · non-contrast
Comparison: 06/01/2017 chest radiograph.

CLINICAL DATA: Dyspnea

EXAM:
CHEST  2 VIEW

[chest lat]
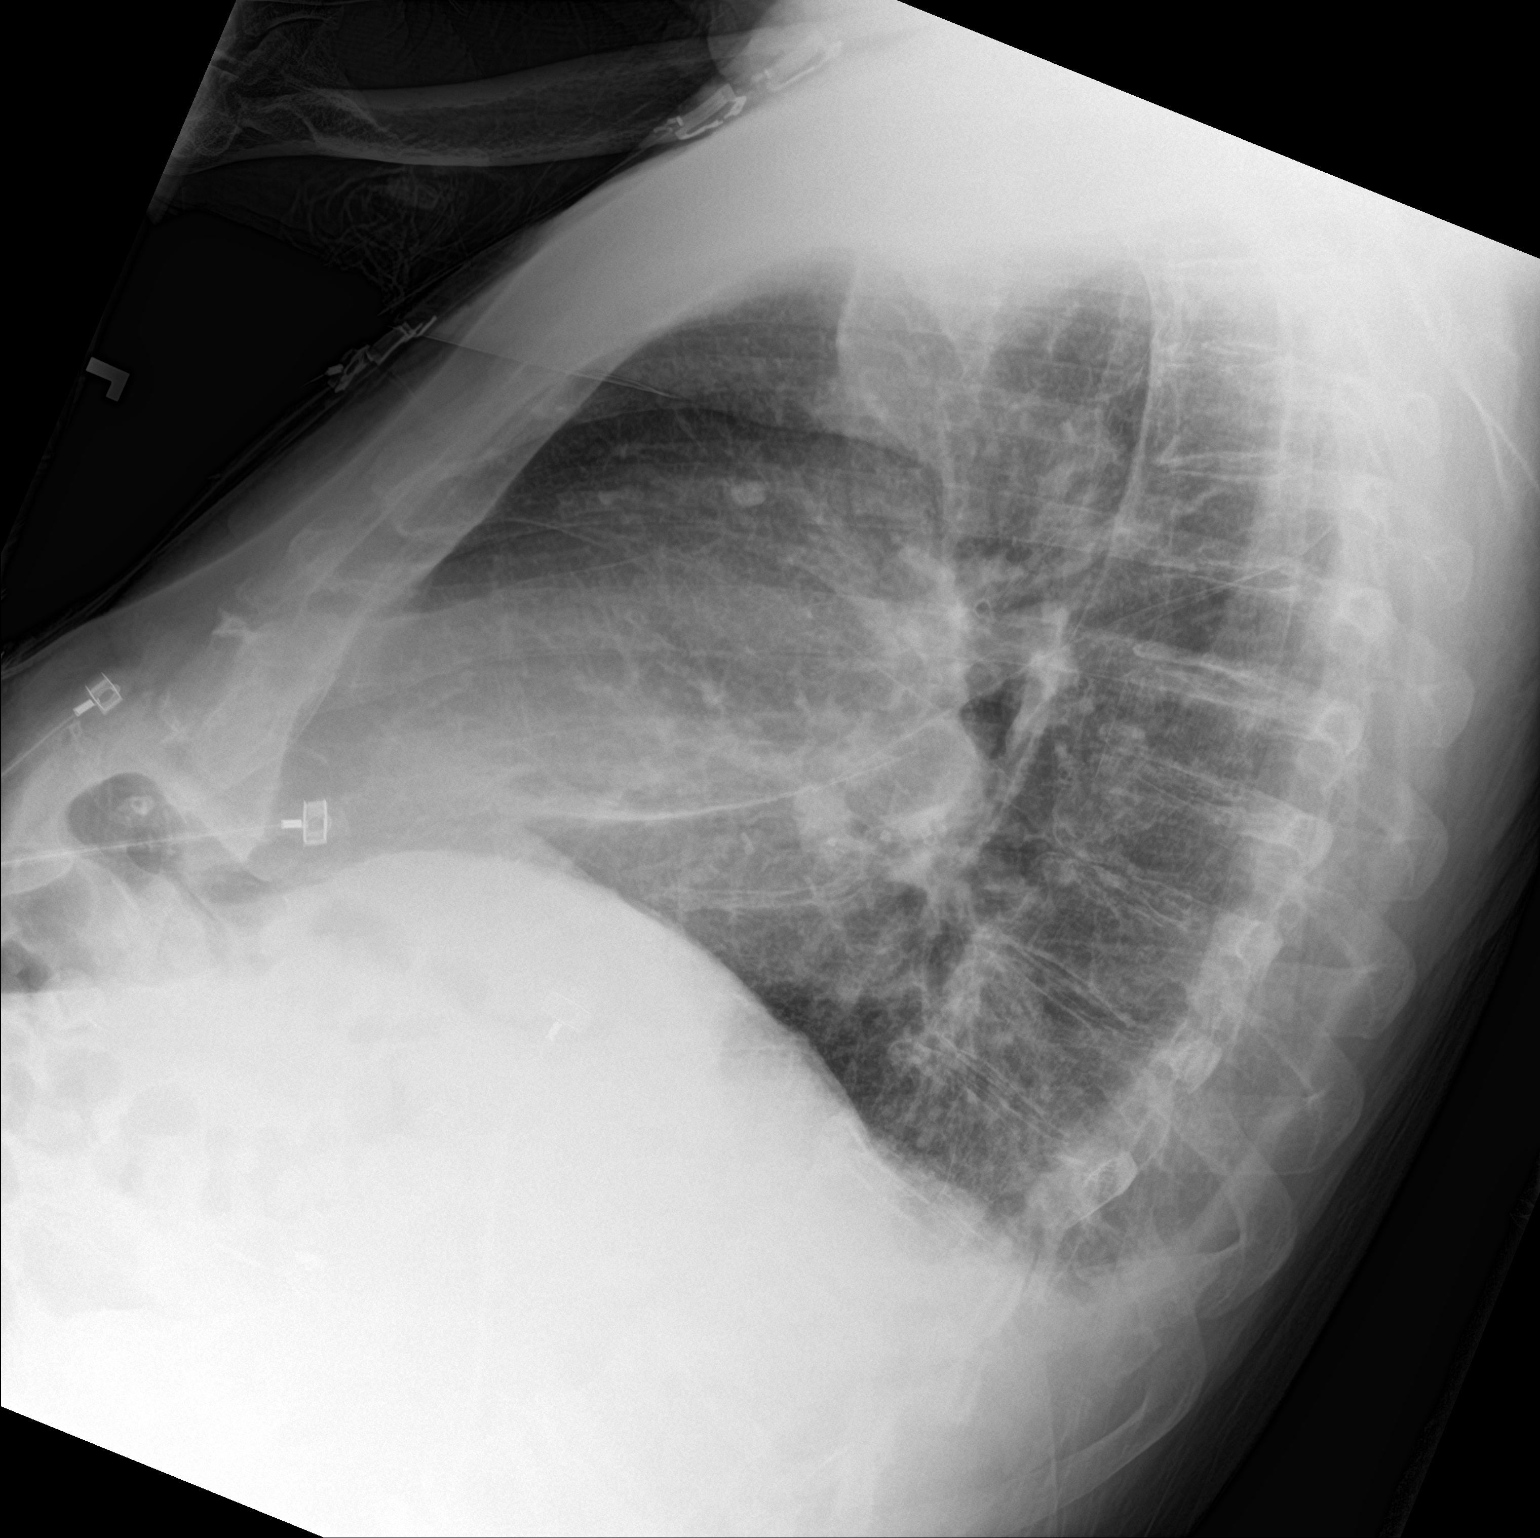

[chest ap]
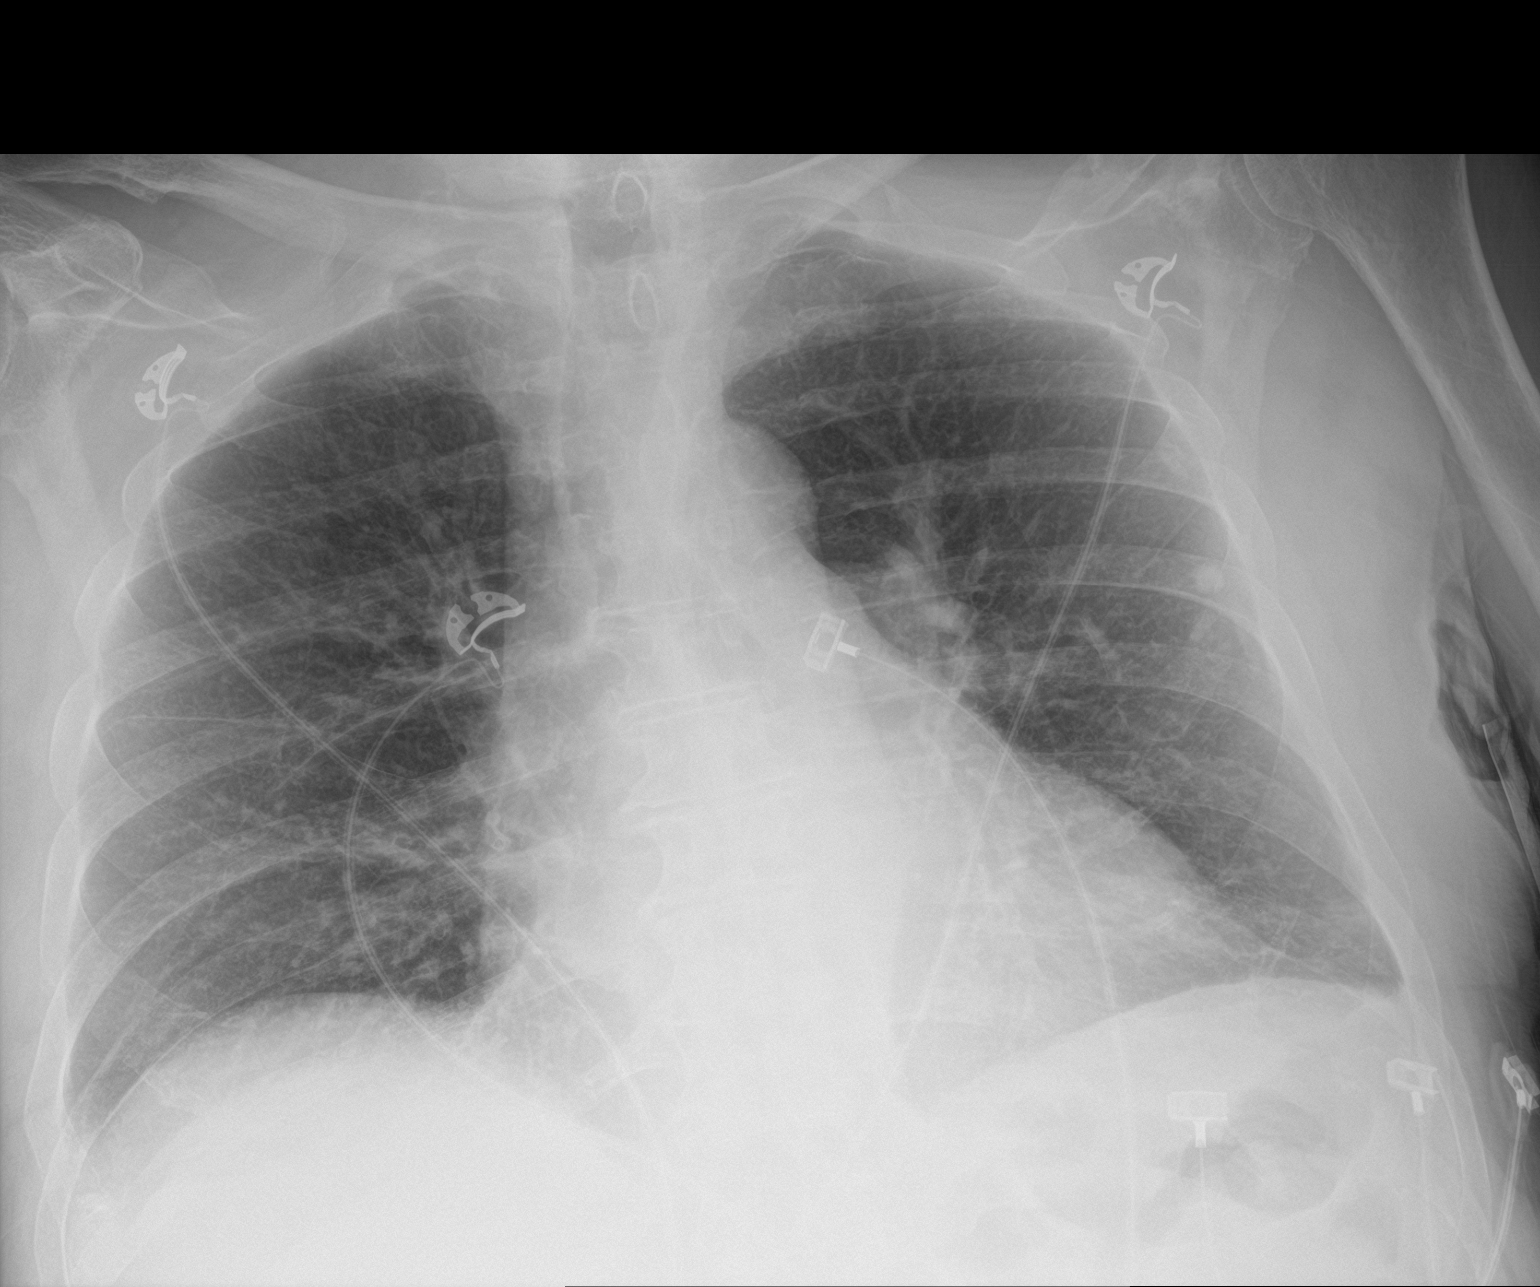

[2 of 2 positions shown; findings below may reference images not displayed]

FINDINGS: Stable cardiomediastinal silhouette with borderline mild
cardiomegaly. No pneumothorax. Trace bilateral pleural effusions.
Borderline mild pulmonary edema. Stable calcified left midlung
granuloma.
IMPRESSION: 1. Borderline mild congestive heart failure.
2. Trace bilateral pleural effusions.

## 2018-12-14 IMAGING — CT CT HEAD W/O CM
3 series · 16 of 47 positions shown, 19 images · non-contrast
Comparison: 06/18/2017

CLINICAL DATA: Multiple falls today.

EXAM:
CT HEAD WITHOUT CONTRAST
TECHNIQUE: Contiguous axial images were obtained from the base of the skull
through the vertex without intravenous contrast.

[Series 2: head trauma wo · axial · 0.48mm/px · z∈[-229,-79]mm · 10 of 36 slices shown, 13 images]
[im 3/36  brain]
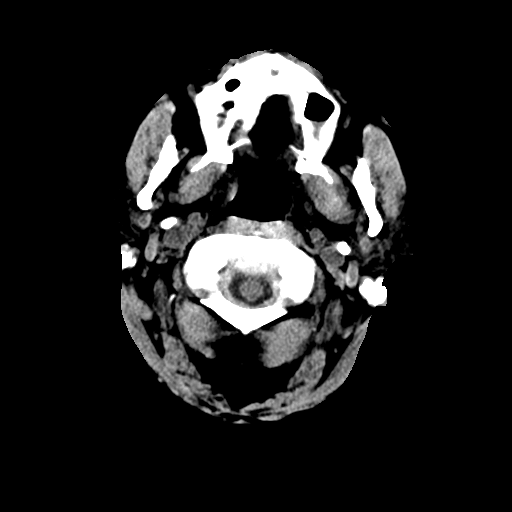
[im 3/36  bone]
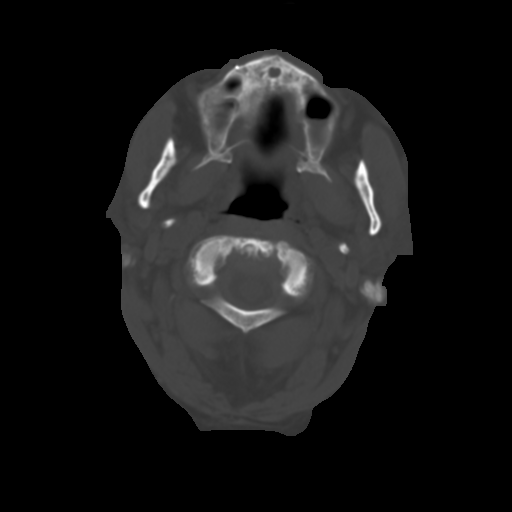
[im 7/36  brain]
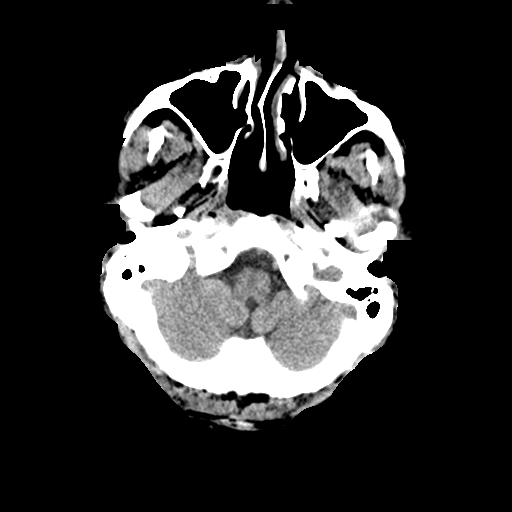
[im 10/36  brain]
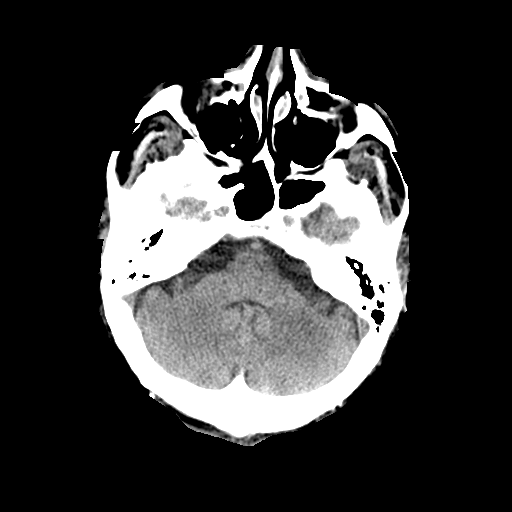
[im 13/36  brain]
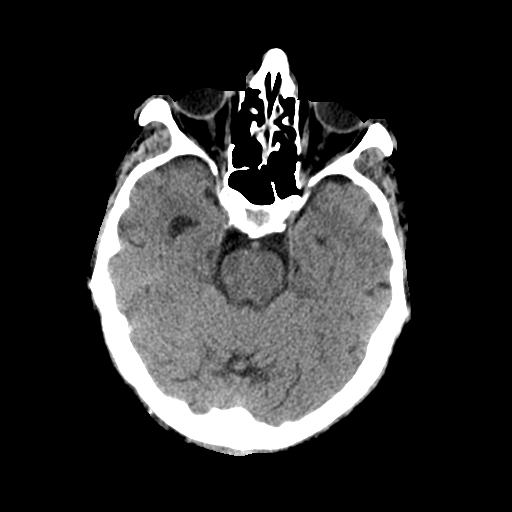
[im 16/36  brain]
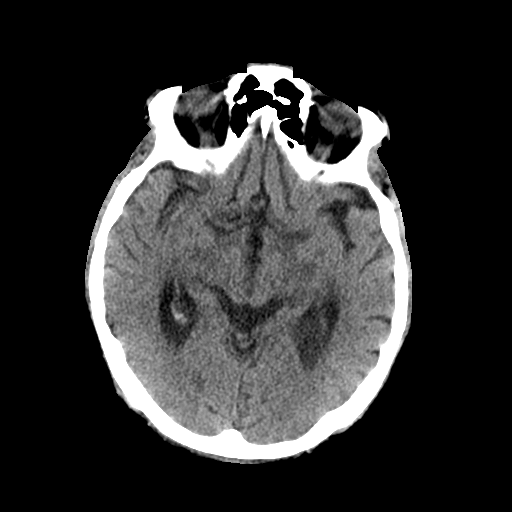
[im 16/36  bone]
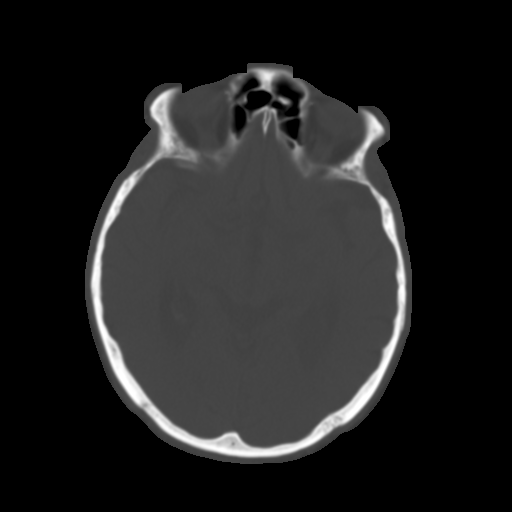
[im 20/36  brain]
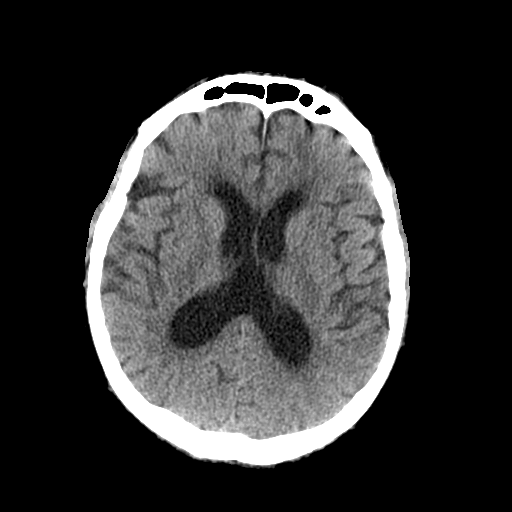
[im 23/36  brain]
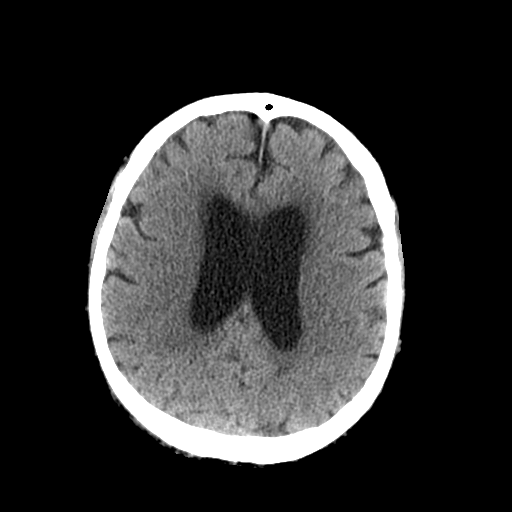
[im 27/36  brain]
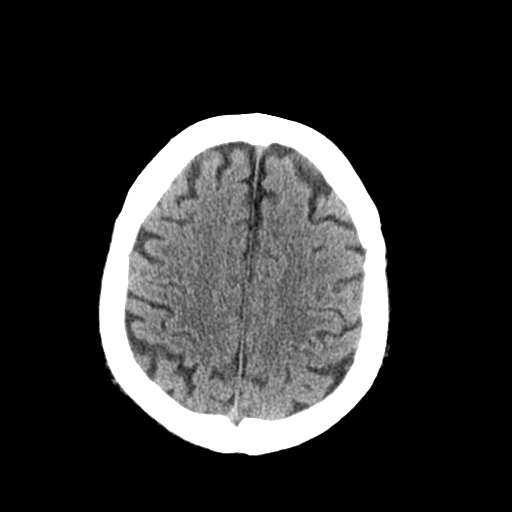
[im 29/36  brain]
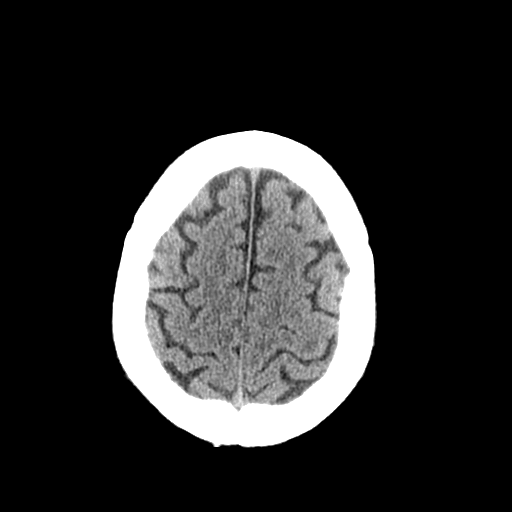
[im 29/36  bone]
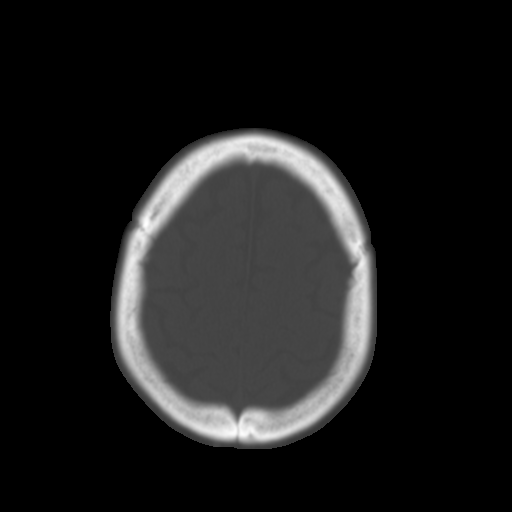
[im 33/36  brain]
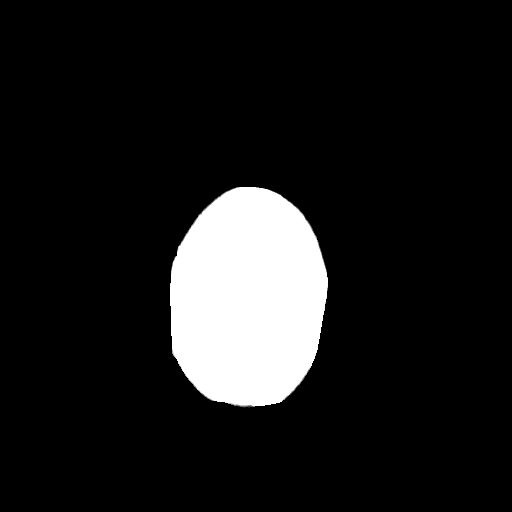

[Series 4: coronal soft tissue · coronal · 0.36mm/px · 3 of 83 slices shown]
[im 28/83  brain]
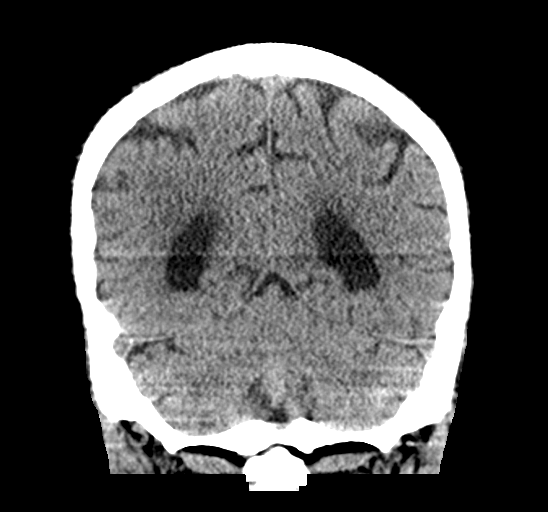
[im 37/83  brain]
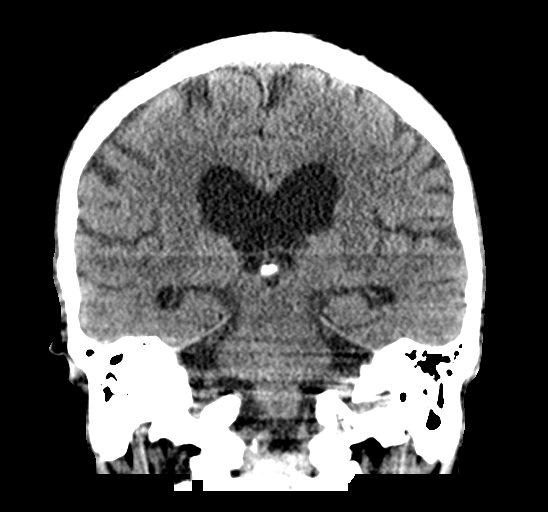
[im 46/83  brain]
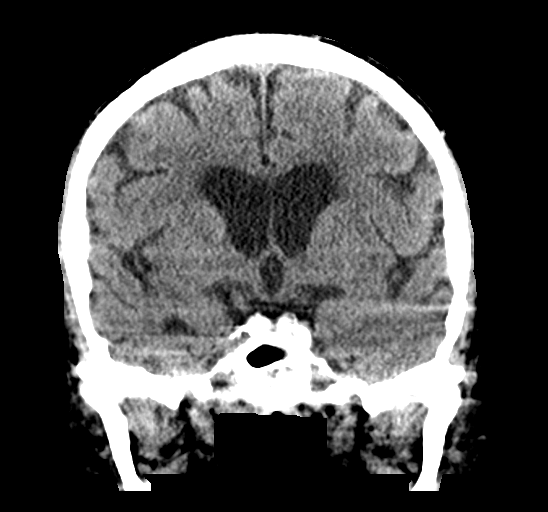

[Series 5: sagittal soft tissue · sagittal · 0.38mm/px · 3 of 58 slices shown]
[im 20/58  brain]
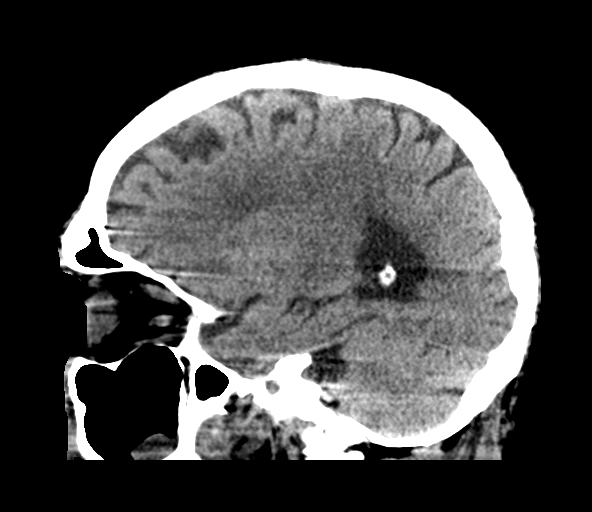
[im 29/58  brain]
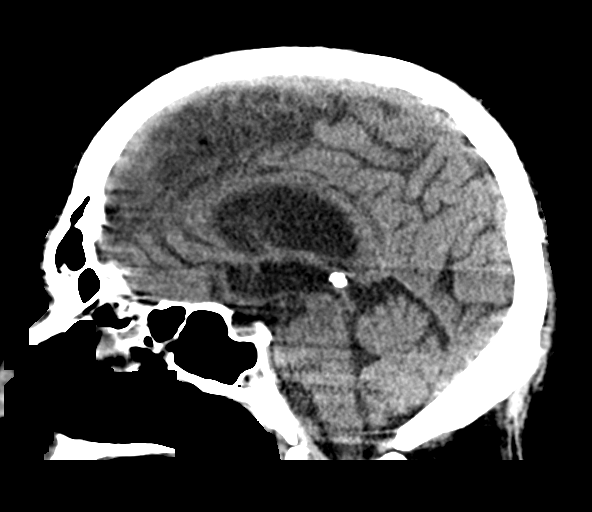
[im 39/58  brain]
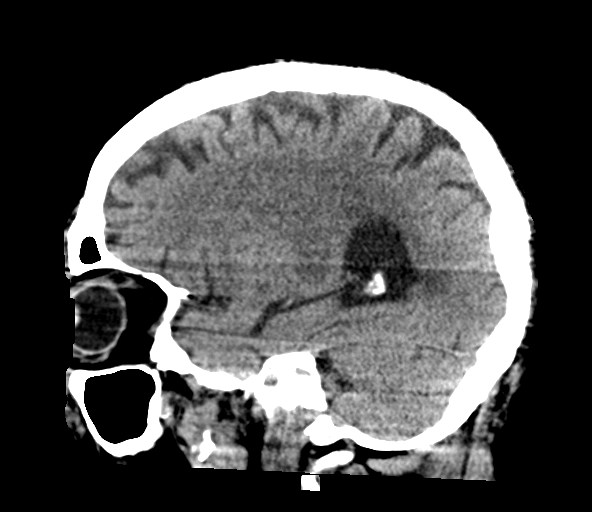

[16 of 47 positions shown; findings below may reference images not displayed]

FINDINGS: Brain: Stable age related cerebral atrophy, ventriculomegaly and
periventricular white matter disease. No extra-axial fluid
collections are identified. No CT findings for acute hemispheric
infarction or intracranial hemorrhage. No mass lesions. The
brainstem and cerebellum are normal.

Vascular: No hyperdense vessel or unexpected calcification.

Skull: No skull fracture or bone lesion.

Sinuses/Orbits: Ethmoid and maxillary sinus disease. The frontal
sinuses are clear and the mastoid air cells and middle ear cavities
are clear.

Other: No scalp lesion or hematoma.
IMPRESSION: 1. Age related cerebral atrophy, ventriculomegaly and
periventricular white matter disease.
2. No acute intracranial findings or skull fracture.
3. Maxillary and ethmoid sinus disease.
